# Patient Record
Sex: Male | Born: 1935 | Race: White | Hispanic: No | Marital: Married | Smoking: Never smoker
Health system: Southern US, Community
[De-identification: ages and names within clinical notes are randomized; demographics above are authoritative.]

## PROBLEM LIST (undated history)

## (undated) DIAGNOSIS — E785 Hyperlipidemia, unspecified: Secondary | ICD-10-CM

## (undated) DIAGNOSIS — I259 Chronic ischemic heart disease, unspecified: Secondary | ICD-10-CM

## (undated) DIAGNOSIS — Z8673 Personal history of transient ischemic attack (TIA), and cerebral infarction without residual deficits: Secondary | ICD-10-CM

## (undated) DIAGNOSIS — M1990 Unspecified osteoarthritis, unspecified site: Secondary | ICD-10-CM

## (undated) DIAGNOSIS — M25551 Pain in right hip: Secondary | ICD-10-CM

## (undated) DIAGNOSIS — K449 Diaphragmatic hernia without obstruction or gangrene: Secondary | ICD-10-CM

## (undated) DIAGNOSIS — I10 Essential (primary) hypertension: Secondary | ICD-10-CM

## (undated) DIAGNOSIS — R296 Repeated falls: Secondary | ICD-10-CM

## (undated) DIAGNOSIS — K219 Gastro-esophageal reflux disease without esophagitis: Secondary | ICD-10-CM

## (undated) DIAGNOSIS — K224 Dyskinesia of esophagus: Secondary | ICD-10-CM

## (undated) DIAGNOSIS — M109 Gout, unspecified: Secondary | ICD-10-CM

## (undated) DIAGNOSIS — J90 Pleural effusion, not elsewhere classified: Secondary | ICD-10-CM

## (undated) DIAGNOSIS — M79672 Pain in left foot: Secondary | ICD-10-CM

## (undated) DIAGNOSIS — K222 Esophageal obstruction: Secondary | ICD-10-CM

## (undated) DIAGNOSIS — C4491 Basal cell carcinoma of skin, unspecified: Secondary | ICD-10-CM

## (undated) DIAGNOSIS — G2581 Restless legs syndrome: Secondary | ICD-10-CM

## (undated) DIAGNOSIS — N400 Enlarged prostate without lower urinary tract symptoms: Secondary | ICD-10-CM

## (undated) DIAGNOSIS — F329 Major depressive disorder, single episode, unspecified: Secondary | ICD-10-CM

## (undated) DIAGNOSIS — Z66 Do not resuscitate: Secondary | ICD-10-CM

## (undated) DIAGNOSIS — IMO0001 Reserved for inherently not codable concepts without codable children: Secondary | ICD-10-CM

## (undated) DIAGNOSIS — F32A Depression, unspecified: Secondary | ICD-10-CM

## (undated) DIAGNOSIS — N200 Calculus of kidney: Secondary | ICD-10-CM

## (undated) DIAGNOSIS — R339 Retention of urine, unspecified: Secondary | ICD-10-CM

## (undated) DIAGNOSIS — F419 Anxiety disorder, unspecified: Secondary | ICD-10-CM

## (undated) DIAGNOSIS — I509 Heart failure, unspecified: Secondary | ICD-10-CM

## (undated) DIAGNOSIS — M79671 Pain in right foot: Secondary | ICD-10-CM

## (undated) DIAGNOSIS — K4090 Unilateral inguinal hernia, without obstruction or gangrene, not specified as recurrent: Secondary | ICD-10-CM

## (undated) DIAGNOSIS — K5792 Diverticulitis of intestine, part unspecified, without perforation or abscess without bleeding: Secondary | ICD-10-CM

## (undated) DIAGNOSIS — M47812 Spondylosis without myelopathy or radiculopathy, cervical region: Secondary | ICD-10-CM

## (undated) HISTORY — PX: FRACTURE SURGERY: SHX138

## (undated) HISTORY — PX: OTHER SURGICAL HISTORY: SHX169

## (undated) HISTORY — DX: Reserved for inherently not codable concepts without codable children: IMO0001

## (undated) HISTORY — DX: Essential (primary) hypertension: I10

## (undated) HISTORY — DX: Anxiety disorder, unspecified: F41.9

## (undated) HISTORY — PX: ESOPHAGEAL DILATION: SHX303

## (undated) HISTORY — DX: Dyskinesia of esophagus: K22.4

## (undated) HISTORY — PX: EXPLORATORY LAPAROTOMY W/ BOWEL RESECTION: SHX1544

## (undated) HISTORY — DX: Gastro-esophageal reflux disease without esophagitis: K21.9

## (undated) HISTORY — PX: HEMIARTHROPLASTY HIP: SUR652

## (undated) HISTORY — DX: Heart failure, unspecified: I50.9

---

## 1997-06-03 ENCOUNTER — Ambulatory Visit (HOSPITAL_BASED_OUTPATIENT_CLINIC_OR_DEPARTMENT_OTHER): Admission: RE | Admit: 1997-06-03 | Discharge: 1997-06-03 | Payer: Self-pay | Admitting: Orthopedic Surgery

## 2000-01-26 ENCOUNTER — Inpatient Hospital Stay (HOSPITAL_COMMUNITY): Admission: AD | Admit: 2000-01-26 | Discharge: 2000-01-27 | Payer: Self-pay | Admitting: Cardiology

## 2000-12-25 ENCOUNTER — Other Ambulatory Visit: Admission: RE | Admit: 2000-12-25 | Discharge: 2000-12-25 | Payer: Self-pay | Admitting: Dermatology

## 2001-11-29 ENCOUNTER — Other Ambulatory Visit: Admission: RE | Admit: 2001-11-29 | Discharge: 2001-11-29 | Payer: Self-pay | Admitting: Dermatology

## 2002-07-11 ENCOUNTER — Other Ambulatory Visit: Admission: RE | Admit: 2002-07-11 | Discharge: 2002-07-11 | Payer: Self-pay | Admitting: Dermatology

## 2003-09-15 ENCOUNTER — Other Ambulatory Visit: Admission: RE | Admit: 2003-09-15 | Discharge: 2003-09-15 | Payer: Self-pay | Admitting: Dermatology

## 2004-08-19 ENCOUNTER — Other Ambulatory Visit: Admission: RE | Admit: 2004-08-19 | Discharge: 2004-08-19 | Payer: Self-pay | Admitting: Dermatology

## 2004-10-25 ENCOUNTER — Other Ambulatory Visit: Admission: RE | Admit: 2004-10-25 | Discharge: 2004-10-25 | Payer: Self-pay | Admitting: Dermatology

## 2005-03-31 ENCOUNTER — Other Ambulatory Visit: Admission: RE | Admit: 2005-03-31 | Discharge: 2005-03-31 | Payer: Self-pay | Admitting: Dermatology

## 2010-06-03 ENCOUNTER — Ambulatory Visit (INDEPENDENT_AMBULATORY_CARE_PROVIDER_SITE_OTHER): Payer: Medicare Other | Admitting: Orthopedic Surgery

## 2010-06-03 ENCOUNTER — Encounter: Payer: Self-pay | Admitting: Orthopedic Surgery

## 2010-06-03 VITALS — HR 64 | Resp 18 | Ht 71.0 in | Wt 170.0 lb

## 2010-06-03 DIAGNOSIS — M1710 Unilateral primary osteoarthritis, unspecified knee: Secondary | ICD-10-CM

## 2010-06-03 MED ORDER — METHYLPREDNISOLONE ACETATE 40 MG/ML IJ SUSP: 40.0000 mg | Freq: Once | INTRAMUSCULAR | Status: DC

## 2010-06-03 NOTE — Progress Notes (Signed)
Chief complaint bilateral knee pain.  77 year old male transfers care from [facility] orthopedists he LEFT town. Presents with over a 5 year history of bilateral knee pain, usually, LEFT greater than RIGHT that today, RIGHT greater than LEFT associated with weakness and giving out of his knees. Intermittent swelling, treated with multiple aspirations and injections successfully.  He does have constant 8/10, throbbing pain. Review of systems unexpected weight loss. Ordering of the heartburn, and constipation, joint pain, unsteady gait, depression, and easy bruising. The other 5 systems reviewed were negative.  His vital signs are stable. His appearance was normal. He was well groomed. He was oriented x3. His mood and affect are normal.  His ambulation was supported by a cane and a shuffling slow gait with decreased range of motion.  And mild contractures in the upper extremities. Nothing serious. All joints were reduced and muscle tone was normal. Skin was normal. X4 extremities.  He has good pulses in both feet and the temperature of the extremities are warm.  Sensation is normal. No pathologic reflexes and balance is good, considering his age and condition.  X-rays were brought with the patient. They show bilateral symmetric joint space narrowing on 2 views of each knee.  We aspirated both knees and injected both knees. The RIGHT had more fluid than the LEFT.  Diagnosis end stage osteoarthritis p Plan aspirations and injections as needed.

## 2010-06-03 NOTE — Patient Instructions (Signed)
You have received a steroid shot. 15% of patients experience increased pain at the injection site with in the next 24 hours. This is best treated with ice and tylenol extra strength 2 tabs every 8 hours. If you are still having pain please call the office.    

## 2010-09-02 ENCOUNTER — Ambulatory Visit (INDEPENDENT_AMBULATORY_CARE_PROVIDER_SITE_OTHER): Payer: Medicare Other | Admitting: Orthopedic Surgery

## 2010-09-02 DIAGNOSIS — IMO0002 Reserved for concepts with insufficient information to code with codable children: Secondary | ICD-10-CM

## 2010-09-02 DIAGNOSIS — M1710 Unilateral primary osteoarthritis, unspecified knee: Secondary | ICD-10-CM

## 2010-09-02 MED ORDER — METHYLPREDNISOLONE ACETATE 40 MG/ML IJ SUSP: 40.0000 mg | Freq: Once | INTRAMUSCULAR | Status: DC

## 2010-09-02 NOTE — Patient Instructions (Signed)
TRY ALEVE TWICE A DAY   AND CAPSAICIN CREAM RUB ON ANY AREAS THAT ARE HURTING 3 TIMES A DAY

## 2010-09-02 NOTE — Progress Notes (Signed)
Injection LEFT knee.  Consent was obtained.  Time out was taken   LEFT knee was injected with Depo-Medrol 40 mg plus lidocaine 1% 4 cc.  Knee was prepped with alcohol and anesthetized with ethyl chloride.  The injection was tolerated without complication.    Verbal consent was obtained   Time out completed   Under sterile conditions the right knee was injected with Depomedrol 40 mg / ml (1 ml) and lidocaine 1% (4 ml)  There were no complications

## 2010-12-22 ENCOUNTER — Encounter: Payer: Self-pay | Admitting: Orthopedic Surgery

## 2010-12-22 ENCOUNTER — Ambulatory Visit (INDEPENDENT_AMBULATORY_CARE_PROVIDER_SITE_OTHER): Payer: Medicare Other | Admitting: Orthopedic Surgery

## 2010-12-22 VITALS — Ht 71.0 in | Wt 176.0 lb

## 2010-12-22 DIAGNOSIS — M1710 Unilateral primary osteoarthritis, unspecified knee: Secondary | ICD-10-CM | POA: Insufficient documentation

## 2010-12-22 DIAGNOSIS — M179 Osteoarthritis of knee, unspecified: Secondary | ICD-10-CM | POA: Insufficient documentation

## 2010-12-22 DIAGNOSIS — M25569 Pain in unspecified knee: Secondary | ICD-10-CM

## 2010-12-22 NOTE — Progress Notes (Signed)
Patient scheduled for requested bilateral knee injections for osteoarthritis chronic.  Age and medical condition did not warrant knee replacement surgery  Knee  Injection Procedure Note  Pre-operative Diagnosis: left knee oa  Post-operative Diagnosis: same  Indications: pain  Anesthesia: ethyl chloride   Procedure Details   Verbal consent was obtained for the procedure. Time out was completed.The joint was prepped with alcohol, followed by  Ethyl chloride spray and A 20 gauge needle was inserted into the knee via lateral approach; 4ml 1% lidocaine and 1 ml of depomedrol  was then injected into the joint . The needle was removed and the area cleansed and dressed.  Complications:  None; patient tolerated the procedure well.  Knee  Injection Procedure Note  Pre-operative Diagnosis: right knee oa  Post-operative Diagnosis: same  Indications: pain  Anesthesia: ethyl chloride   Procedure Details   Verbal consent was obtained for the procedure. Time out was completed.The joint was prepped with alcohol, followed by  Ethyl chloride spray and A 20 gauge needle was inserted into the knee via lateral approach; 4ml 1% lidocaine and 1 ml of depomedrol  was then injected into the joint . The needle was removed and the area cleansed and dressed.  Complications:  None; patient tolerated the procedure well.  Follow every 2-3 months

## 2010-12-22 NOTE — Patient Instructions (Signed)
You have received a steroid shot. 15% of patients experience increased pain at the injection site with in the next 24 hours. This is best treated with ice and tylenol extra strength 2 tabs every 8 hours. If you are still having pain please call the office.    

## 2011-03-30 DIAGNOSIS — M1990 Unspecified osteoarthritis, unspecified site: Secondary | ICD-10-CM | POA: Diagnosis not present

## 2011-03-30 DIAGNOSIS — N400 Enlarged prostate without lower urinary tract symptoms: Secondary | ICD-10-CM | POA: Diagnosis not present

## 2011-05-24 DIAGNOSIS — I739 Peripheral vascular disease, unspecified: Secondary | ICD-10-CM | POA: Diagnosis not present

## 2011-06-22 ENCOUNTER — Ambulatory Visit (INDEPENDENT_AMBULATORY_CARE_PROVIDER_SITE_OTHER): Payer: Medicare Other | Admitting: Orthopedic Surgery

## 2011-06-22 ENCOUNTER — Encounter: Payer: Self-pay | Admitting: Orthopedic Surgery

## 2011-06-22 VITALS — Ht 71.0 in | Wt 176.0 lb

## 2011-06-22 DIAGNOSIS — M1710 Unilateral primary osteoarthritis, unspecified knee: Secondary | ICD-10-CM

## 2011-06-22 DIAGNOSIS — IMO0002 Reserved for concepts with insufficient information to code with codable children: Secondary | ICD-10-CM

## 2011-06-22 NOTE — Patient Instructions (Signed)
You have received a steroid shot. 15% of patients experience increased pain at the injection site with in the next 24 hours. This is best treated with ice and tylenol extra strength 2 tabs every 8 hours. If you are still having pain please call the office.    

## 2011-06-22 NOTE — Progress Notes (Signed)
Patient ID: Patrick [name], male   DOB: 1913/05/04, 78 y.o.   MRN: [id number] Knee  Injection Procedure Note  Pre-operative Diagnosis: left knee oa  Post-operative Diagnosis: same  Indications: pain  Anesthesia: ethyl chloride   Procedure Details   Verbal consent was obtained for the procedure. Time out was completed.The joint was prepped with alcohol, followed by  Ethyl chloride spray and A 20 gauge needle was inserted into the knee via lateral approach; 4ml 1% lidocaine and 1 ml of depomedrol  was then injected into the joint . The needle was removed and the area cleansed and dressed.  Complications:  None; patient tolerated the procedure well. Knee  Injection Procedure Note  Pre-operative Diagnosis: right knee oa  Post-operative Diagnosis: same  Indications: pain  Anesthesia: ethyl chloride   Procedure Details   Verbal consent was obtained for the procedure. Time out was completed.The joint was prepped with alcohol, followed by  Ethyl chloride spray and A 20 gauge needle was inserted into the knee via lateral approach; 4ml 1% lidocaine and 1 ml of depomedrol  was then injected into the joint . The needle was removed and the area cleansed and dressed.  Complications:  None; patient tolerated the procedure well.

## 2011-06-24 DIAGNOSIS — L570 Actinic keratosis: Secondary | ICD-10-CM | POA: Diagnosis not present

## 2011-06-24 DIAGNOSIS — C44320 Squamous cell carcinoma of skin of unspecified parts of face: Secondary | ICD-10-CM | POA: Diagnosis not present

## 2011-06-24 DIAGNOSIS — D235 Other benign neoplasm of skin of trunk: Secondary | ICD-10-CM | POA: Diagnosis not present

## 2011-07-05 DIAGNOSIS — L97509 Non-pressure chronic ulcer of other part of unspecified foot with unspecified severity: Secondary | ICD-10-CM | POA: Diagnosis not present

## 2011-07-14 DIAGNOSIS — L97509 Non-pressure chronic ulcer of other part of unspecified foot with unspecified severity: Secondary | ICD-10-CM | POA: Diagnosis not present

## 2011-07-29 DIAGNOSIS — M1990 Unspecified osteoarthritis, unspecified site: Secondary | ICD-10-CM | POA: Diagnosis not present

## 2011-07-29 DIAGNOSIS — N400 Enlarged prostate without lower urinary tract symptoms: Secondary | ICD-10-CM | POA: Diagnosis not present

## 2011-08-04 DIAGNOSIS — L219 Seborrheic dermatitis, unspecified: Secondary | ICD-10-CM | POA: Diagnosis not present

## 2011-08-04 DIAGNOSIS — Z85828 Personal history of other malignant neoplasm of skin: Secondary | ICD-10-CM | POA: Diagnosis not present

## 2011-08-04 DIAGNOSIS — L570 Actinic keratosis: Secondary | ICD-10-CM | POA: Diagnosis not present

## 2011-08-04 DIAGNOSIS — L97509 Non-pressure chronic ulcer of other part of unspecified foot with unspecified severity: Secondary | ICD-10-CM | POA: Diagnosis not present

## 2011-08-11 DIAGNOSIS — L600 Ingrowing nail: Secondary | ICD-10-CM | POA: Diagnosis not present

## 2011-08-11 DIAGNOSIS — M79609 Pain in unspecified limb: Secondary | ICD-10-CM | POA: Diagnosis not present

## 2011-08-25 DIAGNOSIS — L97509 Non-pressure chronic ulcer of other part of unspecified foot with unspecified severity: Secondary | ICD-10-CM | POA: Diagnosis not present

## 2011-09-21 DIAGNOSIS — M25569 Pain in unspecified knee: Secondary | ICD-10-CM | POA: Diagnosis not present

## 2011-09-21 DIAGNOSIS — G2581 Restless legs syndrome: Secondary | ICD-10-CM | POA: Diagnosis not present

## 2011-09-29 DIAGNOSIS — L97509 Non-pressure chronic ulcer of other part of unspecified foot with unspecified severity: Secondary | ICD-10-CM | POA: Diagnosis not present

## 2011-10-13 DIAGNOSIS — L97509 Non-pressure chronic ulcer of other part of unspecified foot with unspecified severity: Secondary | ICD-10-CM | POA: Diagnosis not present

## 2011-10-30 DIAGNOSIS — Z23 Encounter for immunization: Secondary | ICD-10-CM | POA: Diagnosis not present

## 2011-11-02 DIAGNOSIS — L820 Inflamed seborrheic keratosis: Secondary | ICD-10-CM | POA: Diagnosis not present

## 2011-11-02 DIAGNOSIS — L570 Actinic keratosis: Secondary | ICD-10-CM | POA: Diagnosis not present

## 2011-11-03 DIAGNOSIS — L97509 Non-pressure chronic ulcer of other part of unspecified foot with unspecified severity: Secondary | ICD-10-CM | POA: Diagnosis not present

## 2011-12-01 DIAGNOSIS — L97509 Non-pressure chronic ulcer of other part of unspecified foot with unspecified severity: Secondary | ICD-10-CM | POA: Diagnosis not present

## 2011-12-22 DIAGNOSIS — L97509 Non-pressure chronic ulcer of other part of unspecified foot with unspecified severity: Secondary | ICD-10-CM | POA: Diagnosis not present

## 2011-12-26 DIAGNOSIS — M1990 Unspecified osteoarthritis, unspecified site: Secondary | ICD-10-CM | POA: Diagnosis not present

## 2011-12-26 DIAGNOSIS — F411 Generalized anxiety disorder: Secondary | ICD-10-CM | POA: Diagnosis not present

## 2012-03-01 DIAGNOSIS — L97509 Non-pressure chronic ulcer of other part of unspecified foot with unspecified severity: Secondary | ICD-10-CM | POA: Diagnosis not present

## 2012-04-19 DIAGNOSIS — L97509 Non-pressure chronic ulcer of other part of unspecified foot with unspecified severity: Secondary | ICD-10-CM | POA: Diagnosis not present

## 2012-05-10 DIAGNOSIS — L97509 Non-pressure chronic ulcer of other part of unspecified foot with unspecified severity: Secondary | ICD-10-CM | POA: Diagnosis not present

## 2012-05-24 DIAGNOSIS — M25569 Pain in unspecified knee: Secondary | ICD-10-CM | POA: Diagnosis not present

## 2012-05-24 DIAGNOSIS — G2581 Restless legs syndrome: Secondary | ICD-10-CM | POA: Diagnosis not present

## 2012-06-07 DIAGNOSIS — L97509 Non-pressure chronic ulcer of other part of unspecified foot with unspecified severity: Secondary | ICD-10-CM | POA: Diagnosis not present

## 2012-06-26 DIAGNOSIS — L97309 Non-pressure chronic ulcer of unspecified ankle with unspecified severity: Secondary | ICD-10-CM | POA: Diagnosis not present

## 2012-07-03 DIAGNOSIS — L97509 Non-pressure chronic ulcer of other part of unspecified foot with unspecified severity: Secondary | ICD-10-CM | POA: Diagnosis not present

## 2012-07-17 DIAGNOSIS — L97309 Non-pressure chronic ulcer of unspecified ankle with unspecified severity: Secondary | ICD-10-CM | POA: Diagnosis not present

## 2012-08-09 DIAGNOSIS — L97509 Non-pressure chronic ulcer of other part of unspecified foot with unspecified severity: Secondary | ICD-10-CM | POA: Diagnosis not present

## 2012-08-13 DIAGNOSIS — R609 Edema, unspecified: Secondary | ICD-10-CM | POA: Diagnosis not present

## 2012-08-23 DIAGNOSIS — L97509 Non-pressure chronic ulcer of other part of unspecified foot with unspecified severity: Secondary | ICD-10-CM | POA: Diagnosis not present

## 2012-09-13 DIAGNOSIS — L97509 Non-pressure chronic ulcer of other part of unspecified foot with unspecified severity: Secondary | ICD-10-CM | POA: Diagnosis not present

## 2012-09-27 DIAGNOSIS — M25569 Pain in unspecified knee: Secondary | ICD-10-CM | POA: Diagnosis not present

## 2012-09-27 DIAGNOSIS — R609 Edema, unspecified: Secondary | ICD-10-CM | POA: Diagnosis not present

## 2012-10-04 DIAGNOSIS — L97509 Non-pressure chronic ulcer of other part of unspecified foot with unspecified severity: Secondary | ICD-10-CM | POA: Diagnosis not present

## 2012-11-01 DIAGNOSIS — L97509 Non-pressure chronic ulcer of other part of unspecified foot with unspecified severity: Secondary | ICD-10-CM | POA: Diagnosis not present

## 2012-11-02 DIAGNOSIS — Z23 Encounter for immunization: Secondary | ICD-10-CM | POA: Diagnosis not present

## 2012-11-29 DIAGNOSIS — M2040 Other hammer toe(s) (acquired), unspecified foot: Secondary | ICD-10-CM | POA: Diagnosis not present

## 2012-11-29 DIAGNOSIS — L851 Acquired keratosis [keratoderma] palmaris et plantaris: Secondary | ICD-10-CM | POA: Diagnosis not present

## 2012-11-29 DIAGNOSIS — B351 Tinea unguium: Secondary | ICD-10-CM | POA: Diagnosis not present

## 2012-11-29 DIAGNOSIS — I739 Peripheral vascular disease, unspecified: Secondary | ICD-10-CM | POA: Diagnosis not present

## 2013-01-14 DIAGNOSIS — R609 Edema, unspecified: Secondary | ICD-10-CM | POA: Diagnosis not present

## 2013-01-23 DIAGNOSIS — R609 Edema, unspecified: Secondary | ICD-10-CM | POA: Diagnosis not present

## 2013-01-24 DIAGNOSIS — L97509 Non-pressure chronic ulcer of other part of unspecified foot with unspecified severity: Secondary | ICD-10-CM | POA: Diagnosis not present

## 2013-01-30 DIAGNOSIS — L97509 Non-pressure chronic ulcer of other part of unspecified foot with unspecified severity: Secondary | ICD-10-CM | POA: Diagnosis not present

## 2013-01-30 DIAGNOSIS — M79609 Pain in unspecified limb: Secondary | ICD-10-CM | POA: Diagnosis not present

## 2013-02-20 DIAGNOSIS — L97509 Non-pressure chronic ulcer of other part of unspecified foot with unspecified severity: Secondary | ICD-10-CM | POA: Diagnosis not present

## 2013-02-23 DIAGNOSIS — R262 Difficulty in walking, not elsewhere classified: Secondary | ICD-10-CM | POA: Diagnosis not present

## 2013-02-23 DIAGNOSIS — G2581 Restless legs syndrome: Secondary | ICD-10-CM | POA: Diagnosis not present

## 2013-02-23 DIAGNOSIS — S0003XA Contusion of scalp, initial encounter: Secondary | ICD-10-CM | POA: Diagnosis not present

## 2013-02-23 DIAGNOSIS — Z23 Encounter for immunization: Secondary | ICD-10-CM | POA: Diagnosis not present

## 2013-02-23 DIAGNOSIS — E785 Hyperlipidemia, unspecified: Secondary | ICD-10-CM | POA: Diagnosis not present

## 2013-02-23 DIAGNOSIS — S1093XA Contusion of unspecified part of neck, initial encounter: Secondary | ICD-10-CM | POA: Diagnosis not present

## 2013-02-23 DIAGNOSIS — Z96649 Presence of unspecified artificial hip joint: Secondary | ICD-10-CM | POA: Diagnosis not present

## 2013-02-23 DIAGNOSIS — I4510 Unspecified right bundle-branch block: Secondary | ICD-10-CM | POA: Diagnosis not present

## 2013-02-23 DIAGNOSIS — Z79899 Other long term (current) drug therapy: Secondary | ICD-10-CM | POA: Diagnosis not present

## 2013-02-23 DIAGNOSIS — M2550 Pain in unspecified joint: Secondary | ICD-10-CM | POA: Diagnosis not present

## 2013-02-23 DIAGNOSIS — R296 Repeated falls: Secondary | ICD-10-CM | POA: Diagnosis not present

## 2013-02-23 DIAGNOSIS — Z7401 Bed confinement status: Secondary | ICD-10-CM | POA: Diagnosis not present

## 2013-02-23 DIAGNOSIS — S72009D Fracture of unspecified part of neck of unspecified femur, subsequent encounter for closed fracture with routine healing: Secondary | ICD-10-CM | POA: Diagnosis not present

## 2013-02-23 DIAGNOSIS — M6281 Muscle weakness (generalized): Secondary | ICD-10-CM | POA: Diagnosis not present

## 2013-02-23 DIAGNOSIS — F329 Major depressive disorder, single episode, unspecified: Secondary | ICD-10-CM | POA: Diagnosis present

## 2013-02-23 DIAGNOSIS — Z8673 Personal history of transient ischemic attack (TIA), and cerebral infarction without residual deficits: Secondary | ICD-10-CM | POA: Diagnosis not present

## 2013-02-23 DIAGNOSIS — M1990 Unspecified osteoarthritis, unspecified site: Secondary | ICD-10-CM | POA: Diagnosis not present

## 2013-02-23 DIAGNOSIS — K219 Gastro-esophageal reflux disease without esophagitis: Secondary | ICD-10-CM | POA: Diagnosis not present

## 2013-02-23 DIAGNOSIS — S72009A Fracture of unspecified part of neck of unspecified femur, initial encounter for closed fracture: Secondary | ICD-10-CM | POA: Diagnosis not present

## 2013-02-23 DIAGNOSIS — I10 Essential (primary) hypertension: Secondary | ICD-10-CM | POA: Diagnosis not present

## 2013-02-23 DIAGNOSIS — S0100XA Unspecified open wound of scalp, initial encounter: Secondary | ICD-10-CM | POA: Diagnosis not present

## 2013-02-23 DIAGNOSIS — I509 Heart failure, unspecified: Secondary | ICD-10-CM | POA: Diagnosis not present

## 2013-02-23 DIAGNOSIS — W19XXXA Unspecified fall, initial encounter: Secondary | ICD-10-CM | POA: Diagnosis not present

## 2013-02-23 DIAGNOSIS — R269 Unspecified abnormalities of gait and mobility: Secondary | ICD-10-CM | POA: Diagnosis not present

## 2013-02-23 DIAGNOSIS — R279 Unspecified lack of coordination: Secondary | ICD-10-CM | POA: Diagnosis not present

## 2013-02-23 DIAGNOSIS — S298XXA Other specified injuries of thorax, initial encounter: Secondary | ICD-10-CM | POA: Diagnosis not present

## 2013-02-23 DIAGNOSIS — I259 Chronic ischemic heart disease, unspecified: Secondary | ICD-10-CM | POA: Diagnosis present

## 2013-02-23 DIAGNOSIS — S72033A Displaced midcervical fracture of unspecified femur, initial encounter for closed fracture: Secondary | ICD-10-CM | POA: Diagnosis not present

## 2013-02-23 DIAGNOSIS — R1312 Dysphagia, oropharyngeal phase: Secondary | ICD-10-CM | POA: Diagnosis not present

## 2013-02-23 DIAGNOSIS — J40 Bronchitis, not specified as acute or chronic: Secondary | ICD-10-CM | POA: Diagnosis not present

## 2013-02-23 DIAGNOSIS — Z885 Allergy status to narcotic agent status: Secondary | ICD-10-CM | POA: Diagnosis not present

## 2013-02-23 DIAGNOSIS — F3289 Other specified depressive episodes: Secondary | ICD-10-CM | POA: Diagnosis not present

## 2013-02-23 DIAGNOSIS — Z888 Allergy status to other drugs, medicaments and biological substances status: Secondary | ICD-10-CM | POA: Diagnosis not present

## 2013-02-23 DIAGNOSIS — Z87891 Personal history of nicotine dependence: Secondary | ICD-10-CM | POA: Diagnosis not present

## 2013-02-23 DIAGNOSIS — Z471 Aftercare following joint replacement surgery: Secondary | ICD-10-CM | POA: Diagnosis not present

## 2013-03-01 ENCOUNTER — Inpatient Hospital Stay
Admission: RE | Admit: 2013-03-01 | Discharge: 2013-03-08 | Disposition: A | Discharge status: Nursing Home (Includes ICF) | Payer: BLUE CROSS/BLUE SHIELD | Source: Ambulatory Visit | Attending: Internal Medicine | Admitting: Internal Medicine

## 2013-03-01 DIAGNOSIS — Z87442 Personal history of urinary calculi: Secondary | ICD-10-CM | POA: Diagnosis not present

## 2013-03-01 DIAGNOSIS — Z7401 Bed confinement status: Secondary | ICD-10-CM | POA: Diagnosis not present

## 2013-03-01 DIAGNOSIS — Z862 Personal history of diseases of the blood and blood-forming organs and certain disorders involving the immune mechanism: Secondary | ICD-10-CM | POA: Diagnosis not present

## 2013-03-01 DIAGNOSIS — R079 Chest pain, unspecified: Secondary | ICD-10-CM | POA: Diagnosis not present

## 2013-03-01 DIAGNOSIS — R634 Abnormal weight loss: Secondary | ICD-10-CM | POA: Diagnosis not present

## 2013-03-01 DIAGNOSIS — S72009D Fracture of unspecified part of neck of unspecified femur, subsequent encounter for closed fracture with routine healing: Secondary | ICD-10-CM | POA: Diagnosis not present

## 2013-03-01 DIAGNOSIS — Z85828 Personal history of other malignant neoplasm of skin: Secondary | ICD-10-CM | POA: Diagnosis not present

## 2013-03-01 DIAGNOSIS — L259 Unspecified contact dermatitis, unspecified cause: Secondary | ICD-10-CM | POA: Diagnosis not present

## 2013-03-01 DIAGNOSIS — F3289 Other specified depressive episodes: Secondary | ICD-10-CM | POA: Diagnosis not present

## 2013-03-01 DIAGNOSIS — M1990 Unspecified osteoarthritis, unspecified site: Secondary | ICD-10-CM | POA: Diagnosis not present

## 2013-03-01 DIAGNOSIS — Z8709 Personal history of other diseases of the respiratory system: Secondary | ICD-10-CM | POA: Diagnosis not present

## 2013-03-01 DIAGNOSIS — R5381 Other malaise: Secondary | ICD-10-CM | POA: Diagnosis not present

## 2013-03-01 DIAGNOSIS — Z23 Encounter for immunization: Secondary | ICD-10-CM | POA: Diagnosis not present

## 2013-03-01 DIAGNOSIS — G4700 Insomnia, unspecified: Secondary | ICD-10-CM | POA: Diagnosis not present

## 2013-03-01 DIAGNOSIS — R279 Unspecified lack of coordination: Secondary | ICD-10-CM | POA: Diagnosis not present

## 2013-03-01 DIAGNOSIS — R0602 Shortness of breath: Secondary | ICD-10-CM | POA: Diagnosis not present

## 2013-03-01 DIAGNOSIS — M47812 Spondylosis without myelopathy or radiculopathy, cervical region: Secondary | ICD-10-CM | POA: Diagnosis not present

## 2013-03-01 DIAGNOSIS — J90 Pleural effusion, not elsewhere classified: Secondary | ICD-10-CM | POA: Diagnosis not present

## 2013-03-01 DIAGNOSIS — Z79899 Other long term (current) drug therapy: Secondary | ICD-10-CM | POA: Diagnosis not present

## 2013-03-01 DIAGNOSIS — Z7982 Long term (current) use of aspirin: Secondary | ICD-10-CM | POA: Diagnosis not present

## 2013-03-01 DIAGNOSIS — R1312 Dysphagia, oropharyngeal phase: Secondary | ICD-10-CM | POA: Diagnosis not present

## 2013-03-01 DIAGNOSIS — F411 Generalized anxiety disorder: Secondary | ICD-10-CM | POA: Diagnosis not present

## 2013-03-01 DIAGNOSIS — R339 Retention of urine, unspecified: Secondary | ICD-10-CM | POA: Diagnosis not present

## 2013-03-01 DIAGNOSIS — M6281 Muscle weakness (generalized): Secondary | ICD-10-CM | POA: Diagnosis not present

## 2013-03-01 DIAGNOSIS — N3289 Other specified disorders of bladder: Secondary | ICD-10-CM | POA: Diagnosis not present

## 2013-03-01 DIAGNOSIS — G2581 Restless legs syndrome: Secondary | ICD-10-CM | POA: Diagnosis not present

## 2013-03-01 DIAGNOSIS — S72033A Displaced midcervical fracture of unspecified femur, initial encounter for closed fracture: Secondary | ICD-10-CM | POA: Diagnosis not present

## 2013-03-01 DIAGNOSIS — Z8673 Personal history of transient ischemic attack (TIA), and cerebral infarction without residual deficits: Secondary | ICD-10-CM | POA: Diagnosis not present

## 2013-03-01 DIAGNOSIS — R1084 Generalized abdominal pain: Secondary | ICD-10-CM | POA: Diagnosis not present

## 2013-03-01 DIAGNOSIS — Z8639 Personal history of other endocrine, nutritional and metabolic disease: Secondary | ICD-10-CM | POA: Diagnosis not present

## 2013-03-01 DIAGNOSIS — S72143A Displaced intertrochanteric fracture of unspecified femur, initial encounter for closed fracture: Secondary | ICD-10-CM | POA: Diagnosis not present

## 2013-03-01 DIAGNOSIS — I10 Essential (primary) hypertension: Secondary | ICD-10-CM | POA: Diagnosis not present

## 2013-03-01 DIAGNOSIS — K7689 Other specified diseases of liver: Secondary | ICD-10-CM | POA: Diagnosis not present

## 2013-03-01 DIAGNOSIS — R262 Difficulty in walking, not elsewhere classified: Secondary | ICD-10-CM | POA: Diagnosis not present

## 2013-03-01 DIAGNOSIS — I509 Heart failure, unspecified: Secondary | ICD-10-CM | POA: Diagnosis not present

## 2013-03-01 DIAGNOSIS — J9819 Other pulmonary collapse: Secondary | ICD-10-CM | POA: Diagnosis not present

## 2013-03-01 DIAGNOSIS — M2550 Pain in unspecified joint: Secondary | ICD-10-CM | POA: Diagnosis not present

## 2013-03-01 DIAGNOSIS — N400 Enlarged prostate without lower urinary tract symptoms: Secondary | ICD-10-CM | POA: Diagnosis not present

## 2013-03-01 DIAGNOSIS — R5383 Other fatigue: Secondary | ICD-10-CM | POA: Diagnosis not present

## 2013-03-01 DIAGNOSIS — M25559 Pain in unspecified hip: Secondary | ICD-10-CM | POA: Diagnosis not present

## 2013-03-01 DIAGNOSIS — K4090 Unilateral inguinal hernia, without obstruction or gangrene, not specified as recurrent: Secondary | ICD-10-CM | POA: Diagnosis not present

## 2013-03-01 DIAGNOSIS — K219 Gastro-esophageal reflux disease without esophagitis: Secondary | ICD-10-CM | POA: Diagnosis not present

## 2013-03-01 DIAGNOSIS — F329 Major depressive disorder, single episode, unspecified: Secondary | ICD-10-CM | POA: Diagnosis not present

## 2013-03-02 ENCOUNTER — Ambulatory Visit (HOSPITAL_COMMUNITY)
Admission: RE | Admit: 2013-03-02 | Discharge: 2013-03-02 | Disposition: A | Discharge status: Home / Self Care | Payer: Medicare Other | Source: Ambulatory Visit | Attending: Internal Medicine | Admitting: Internal Medicine

## 2013-03-02 ENCOUNTER — Encounter: Payer: Self-pay | Admitting: Internal Medicine

## 2013-03-02 ENCOUNTER — Other Ambulatory Visit (HOSPITAL_BASED_OUTPATIENT_CLINIC_OR_DEPARTMENT_OTHER): Payer: Self-pay | Admitting: Internal Medicine

## 2013-03-02 ENCOUNTER — Non-Acute Institutional Stay (SKILLED_NURSING_FACILITY): Payer: Medicare Other | Admitting: Internal Medicine

## 2013-03-02 DIAGNOSIS — J90 Pleural effusion, not elsewhere classified: Secondary | ICD-10-CM | POA: Diagnosis not present

## 2013-03-02 DIAGNOSIS — F329 Major depressive disorder, single episode, unspecified: Secondary | ICD-10-CM

## 2013-03-02 DIAGNOSIS — R0602 Shortness of breath: Secondary | ICD-10-CM

## 2013-03-02 DIAGNOSIS — G2581 Restless legs syndrome: Secondary | ICD-10-CM

## 2013-03-02 DIAGNOSIS — J9819 Other pulmonary collapse: Secondary | ICD-10-CM | POA: Diagnosis not present

## 2013-03-02 DIAGNOSIS — F32A Depression, unspecified: Secondary | ICD-10-CM

## 2013-03-02 DIAGNOSIS — F3289 Other specified depressive episodes: Secondary | ICD-10-CM | POA: Diagnosis not present

## 2013-03-02 DIAGNOSIS — N400 Enlarged prostate without lower urinary tract symptoms: Secondary | ICD-10-CM

## 2013-03-02 DIAGNOSIS — S72009D Fracture of unspecified part of neck of unspecified femur, subsequent encounter for closed fracture with routine healing: Secondary | ICD-10-CM | POA: Diagnosis not present

## 2013-03-02 DIAGNOSIS — R0902 Hypoxemia: Secondary | ICD-10-CM

## 2013-03-02 NOTE — Progress Notes (Signed)
Patient ID: Patrick [name], male   DOB: [birth year], 80 y.o.   MRN: [id number]    This encounter was created in error - please disregard.

## 2013-03-04 ENCOUNTER — Non-Acute Institutional Stay (SKILLED_NURSING_FACILITY): Payer: Medicare Other | Admitting: Internal Medicine

## 2013-03-04 ENCOUNTER — Ambulatory Visit (HOSPITAL_COMMUNITY): Payer: No Typology Code available for payment source | Attending: Internal Medicine

## 2013-03-04 DIAGNOSIS — S72142A Displaced intertrochanteric fracture of left femur, initial encounter for closed fracture: Secondary | ICD-10-CM

## 2013-03-04 DIAGNOSIS — K4090 Unilateral inguinal hernia, without obstruction or gangrene, not specified as recurrent: Secondary | ICD-10-CM

## 2013-03-04 DIAGNOSIS — I509 Heart failure, unspecified: Secondary | ICD-10-CM

## 2013-03-04 DIAGNOSIS — S72143A Displaced intertrochanteric fracture of unspecified femur, initial encounter for closed fracture: Secondary | ICD-10-CM

## 2013-03-04 DIAGNOSIS — K219 Gastro-esophageal reflux disease without esophagitis: Secondary | ICD-10-CM

## 2013-03-04 DIAGNOSIS — J9819 Other pulmonary collapse: Secondary | ICD-10-CM | POA: Insufficient documentation

## 2013-03-04 NOTE — Progress Notes (Signed)
Patient ID: Patrick [name], male   DOB: 06-Oct-1913, 80 y.o.   MRN: [id number]  Facility; [facility] Chief complaint; admission to SNF post admit to [facility] from 1/11 to 1/16  History; this is a 80 year old man who had a fall. He apparently lives in an assisted living. He had a left femur neck fracture. He underwent a left hip hemiarthroplasty. He does not sound as if he had any significant postoperative problems. A chest x-ray was done that was clear.  Since he is admitted to this SNF for he has complained of shortness of breath. A chest x-ray showed bilateral pleural effusions lab work showed a comprehensive metabolic panel with an albumin of 2.4 and a BNP of 2132... he was started on Lasix 20 mg a day for 3 doses and some potassium.  Past medical history #1 history of cervical degenerative joint disease #2 osteoarthritis #3 basal cell skin cancer #4 diverticulosis #5 hyperlipidemia #6 inguinal hernia #7 ischemic heart disease #8 hiatal hernia #9 nephrolithiasis #10 esophageal stricture and dilation in the past #11 recurrent TIAs #12 restless leg syndrome on request #13 recurrent diverticulitis had a perforation and exploratory laparotomy laparotomy and sigmoid resection in 2011 #14 gastroesophageal reflux disease #15 hypertension #16 right ureter stone with right-sided hydronephrosis in 2011  Discharge medications #1 Flomax 0.4 one by mouth daily #2 omeprazole 40 mg one by mouth daily #3 Requip 1 mg by mouth twice a day #4 Zoloft 50 one by mouth daily #5 Vicodin 5/325 1 by mouth every 6 when necessary #6 aspirin 325 one by mouth twice a day for 2 weeks followed by 325 one by mouth q.  Socially; patient states he lives at [facility] assisted living presumably in [place]. Used a walker. Has a wheelchair but does not use it. [address] on the way to the bathroom  Review of systems Respiratory; complained of shortness of breath over the weekend Cardiac no chest pain or  palpitations GI no, pain GU no dysuria Musculoskeletal operative leg on the left is rotated inward  Physical examination General the patient is frail-looking but it seems to be quite cognitively intact Gen. O2 saturation 95% on 2 L respirations 24 and Cheyne-Stokes pulse rate 77 Respiratory clear entry bilaterally Cardiac heart sounds are normal there is no murmurs or gallops. JVP is not elevated Abdomen; there is a significant right direct inguinal hernia however this reduces easily probably some degree of an incisional hernia related to his previous colon surgery on the right again this reduces easily GU; latter is not distended no CVA tenderness Extremities left hip incision looks well approximated without evidence of infection some mild bruising. No evidence of a DVT osteoarthritis of both knees. Again the left leg is indeed inverted.   Impression/plan #1 status post left hip hemiarthroplasty; aspirin we'll not provide adequate DVT prophylaxis we'll move to xarelto #2 question congestive heart diagnosed since his arrival here. I've reviewed the chest x-ray which doesn't seem to show bilateral pleural effusions but without other stigmata of congestive heart failure. This may be volume overload postoperatively. He has been given Lasix 20 mg daily for 3 days will require a followup chest x-ray #3 extensive right-sided abdominal herniation which I think is a combination of a direct inguinal hernia on the right as well as incisional hernia. All of this appears to be asymptomatic #4 BPH normal no evidence that this is active #5 significant gastroesophageal reflux disease on PPI #6 restless leg syndrome on Requip twice a day

## 2013-03-07 ENCOUNTER — Non-Acute Institutional Stay (SKILLED_NURSING_FACILITY): Payer: Medicare Other | Admitting: Internal Medicine

## 2013-03-07 DIAGNOSIS — G4700 Insomnia, unspecified: Secondary | ICD-10-CM | POA: Diagnosis not present

## 2013-03-07 DIAGNOSIS — L259 Unspecified contact dermatitis, unspecified cause: Secondary | ICD-10-CM | POA: Diagnosis not present

## 2013-03-07 DIAGNOSIS — J90 Pleural effusion, not elsewhere classified: Secondary | ICD-10-CM | POA: Diagnosis not present

## 2013-03-07 DIAGNOSIS — L309 Dermatitis, unspecified: Secondary | ICD-10-CM

## 2013-03-07 NOTE — Progress Notes (Signed)
Patient ID: Patrick [name], male   DOB: [birth year], 80 y.o.   MRN: [id number]   This is an acute visit.  Level of care skilled.  Facility [facility].  Chief complaint-acute visit followup question CHF-shortness of breath.  History of present illness.  Patient is a very pleasant 80 year old male here for rehabilitation after sustaining a left hip fracture that was surgically repaired apparently without much complication.  Apparently shortly after his arrival here he did have an hypoxic episode with shortness of breath chest x-ray was ordered which showed bilateral pleural effusions with passive atelectasis.  I did start him on Lasix low dose 20 mg a day for 3 days over the weekend-Dr. Dellia Nims did followup on this on Monday  reviewed labs which were ordered--which showed a somewhat low albumin of 2.4 as well as a pro BNP of 2132.--Per his review he felt this may be volume overload postop and ordered a followup chest x-ray--also wrote to continue the Lasix and potassium   A followup chest x-ray on January 19 showed unchanged bilateral pleural effusions and bibasilar atelectasis-negative for edema  He is on 2 L of oxygen O2 stats have been satisfactory in the mid high 90s he does not complaini of any cough or shortness of breath appears to be comfortable.  Family states he does have some history of insomnia and apparently had been on numerous medications at one point in the past including apparently Benadryl and Xanax at night he also received Requip-he continues on the Requip for restless legs.  Apparently he's had some restless evenings I did speak to him about that this evening-he says actually he slept  well last night we will have to keep an eye on this however  Family medical social history as been reviewed per progress note on 03/04/2012.  Medications have been reviewed per MAR-it appears his Lasix was stopped on the 20th we will have to restart this.  Review of systems.  In general  denies any fever or chills.  Respiratory no shortness of breath or cough.  Cardiac no chest pain.  GI no complaint of nausea vomiting diarrhea constipation.  Psych-appears grossly oriented does not appear to be anxious as noted said he slept well last night.  Apparently some significant history of restlessness insomnia at home and apparently according to family earlier in his stay here.  Physical exam.  Temperature is 97.9 pulse 88 respirations 20 blood pressure 106/50 O2 saturation 97% on 2 L oxygen.  In general this is a very pleasant elderly male in no distress lying comfortably in bed.  His skin is warm and dry.  I do note the left third toe distal aspect there is a dry crusted area-there is no surrounding edema erythema or tenderness.  Chest is clear to auscultation no labored breathing.  Heart is regular rate and rhythm without murmur gallop or rub he has scant lower extremity edema.  Abdomen is soft nontender positive bowel sounds.--hx hernia  On right...--Non tender him  Neurologic-appears grossly intact his speech is clear.  Labs.  January 21 t 2015.  Sodium 138 potassium 3.9 BUN 13 creatinine 0.88.  Assessment and plan.  #1-history of pleural effusions-updated x-ray shows this is relatively unchanged-will write order to make sure Lasix is restarted-give 1 dose tonight and then continue 20 mg a day as well as well as potassium 10 mEq a day-recheck a metabolic panel early next week -- respiratory status is stable Also update BNP next week--weights have been ordered 3 times  a week-according to the data  I see his weight was 189 on the 17th and 177 on the 21st-I suspect there may be some scale vari ability here we'll continue to monitor weights  #2-history of insomnia-apparently slept well last night although we'll have to monitor this-apparently he was taking Xanax as well as Benadryl at night certainly would be concerned about oversedation -says he slept well last  night Will monitor for now--continues to receive Requip for restless legs  #3-toe issues-I spoke with nursing about having wound care to monitor this-I do not see any sign of infection but this will have to be followed--and we'll await their input.  [id number]        .  D

## 2013-03-08 ENCOUNTER — Emergency Department (HOSPITAL_COMMUNITY): Payer: Medicare Other

## 2013-03-08 ENCOUNTER — Emergency Department (HOSPITAL_COMMUNITY)
Admission: EM | Admit: 2013-03-08 | Discharge: 2013-03-08 | Disposition: A | Discharge status: Skilled Nursing Facility | Payer: Medicare Other | Attending: Emergency Medicine | Admitting: Emergency Medicine

## 2013-03-08 ENCOUNTER — Inpatient Hospital Stay
Admission: RE | Admit: 2013-03-08 | Discharge: 2013-08-23 | Disposition: A | Discharge status: Skilled Nursing Facility | Payer: Medicare Other | Source: Ambulatory Visit | Attending: Internal Medicine | Admitting: Internal Medicine

## 2013-03-08 ENCOUNTER — Encounter (HOSPITAL_COMMUNITY): Payer: Self-pay | Admitting: Emergency Medicine

## 2013-03-08 DIAGNOSIS — R079 Chest pain, unspecified: Secondary | ICD-10-CM | POA: Diagnosis not present

## 2013-03-08 DIAGNOSIS — R1084 Generalized abdominal pain: Secondary | ICD-10-CM | POA: Diagnosis not present

## 2013-03-08 DIAGNOSIS — R339 Retention of urine, unspecified: Secondary | ICD-10-CM

## 2013-03-08 DIAGNOSIS — Z8639 Personal history of other endocrine, nutritional and metabolic disease: Secondary | ICD-10-CM | POA: Insufficient documentation

## 2013-03-08 DIAGNOSIS — G2581 Restless legs syndrome: Secondary | ICD-10-CM | POA: Insufficient documentation

## 2013-03-08 DIAGNOSIS — K7689 Other specified diseases of liver: Secondary | ICD-10-CM | POA: Diagnosis not present

## 2013-03-08 DIAGNOSIS — N3289 Other specified disorders of bladder: Secondary | ICD-10-CM | POA: Diagnosis not present

## 2013-03-08 DIAGNOSIS — M47812 Spondylosis without myelopathy or radiculopathy, cervical region: Secondary | ICD-10-CM | POA: Insufficient documentation

## 2013-03-08 DIAGNOSIS — Z87442 Personal history of urinary calculi: Secondary | ICD-10-CM | POA: Insufficient documentation

## 2013-03-08 DIAGNOSIS — K219 Gastro-esophageal reflux disease without esophagitis: Secondary | ICD-10-CM | POA: Insufficient documentation

## 2013-03-08 DIAGNOSIS — Z8709 Personal history of other diseases of the respiratory system: Secondary | ICD-10-CM | POA: Insufficient documentation

## 2013-03-08 DIAGNOSIS — Z85828 Personal history of other malignant neoplasm of skin: Secondary | ICD-10-CM | POA: Insufficient documentation

## 2013-03-08 DIAGNOSIS — Z862 Personal history of diseases of the blood and blood-forming organs and certain disorders involving the immune mechanism: Secondary | ICD-10-CM | POA: Insufficient documentation

## 2013-03-08 DIAGNOSIS — F411 Generalized anxiety disorder: Secondary | ICD-10-CM | POA: Insufficient documentation

## 2013-03-08 DIAGNOSIS — Z79899 Other long term (current) drug therapy: Secondary | ICD-10-CM | POA: Insufficient documentation

## 2013-03-08 DIAGNOSIS — Z7982 Long term (current) use of aspirin: Secondary | ICD-10-CM | POA: Insufficient documentation

## 2013-03-08 DIAGNOSIS — I10 Essential (primary) hypertension: Secondary | ICD-10-CM | POA: Insufficient documentation

## 2013-03-08 DIAGNOSIS — Z8673 Personal history of transient ischemic attack (TIA), and cerebral infarction without residual deficits: Secondary | ICD-10-CM | POA: Insufficient documentation

## 2013-03-08 DIAGNOSIS — J189 Pneumonia, unspecified organism: Principal | ICD-10-CM

## 2013-03-08 HISTORY — DX: Diverticulitis of intestine, part unspecified, without perforation or abscess without bleeding: K57.92

## 2013-03-08 HISTORY — DX: Hyperlipidemia, unspecified: E78.5

## 2013-03-08 HISTORY — DX: Gastro-esophageal reflux disease without esophagitis: K21.9

## 2013-03-08 HISTORY — DX: Esophageal obstruction: K22.2

## 2013-03-08 HISTORY — DX: Restless legs syndrome: G25.81

## 2013-03-08 HISTORY — DX: Calculus of kidney: N20.0

## 2013-03-08 HISTORY — DX: Pleural effusion, not elsewhere classified: J90

## 2013-03-08 HISTORY — DX: Unspecified osteoarthritis, unspecified site: M19.90

## 2013-03-08 HISTORY — DX: Personal history of transient ischemic attack (TIA), and cerebral infarction without residual deficits: Z86.73

## 2013-03-08 HISTORY — DX: Diaphragmatic hernia without obstruction or gangrene: K44.9

## 2013-03-08 HISTORY — DX: Unilateral inguinal hernia, without obstruction or gangrene, not specified as recurrent: K40.90

## 2013-03-08 HISTORY — DX: Basal cell carcinoma of skin, unspecified: C44.91

## 2013-03-08 HISTORY — DX: Chronic ischemic heart disease, unspecified: I25.9

## 2013-03-08 HISTORY — DX: Spondylosis without myelopathy or radiculopathy, cervical region: M47.812

## 2013-03-08 LAB — CBC WITH DIFFERENTIAL/PLATELET
BASOS PCT: 0 % (ref 0–1)
Basophils Absolute: 0 10*3/uL (ref 0.0–0.1)
EOS ABS: 0.1 10*3/uL (ref 0.0–0.7)
Eosinophils Relative: 1 % (ref 0–5)
HCT: 35 % — ABNORMAL LOW (ref 39.0–52.0)
Hemoglobin: 11.7 g/dL — ABNORMAL LOW (ref 13.0–17.0)
Lymphocytes Relative: 9 % — ABNORMAL LOW (ref 12–46)
Lymphs Abs: 1.1 10*3/uL (ref 0.7–4.0)
MCH: 30.9 pg (ref 26.0–34.0)
MCHC: 33.4 g/dL (ref 30.0–36.0)
MCV: 92.3 fL (ref 78.0–100.0)
Monocytes Absolute: 0.9 10*3/uL (ref 0.1–1.0)
Monocytes Relative: 7 % (ref 3–12)
NEUTROS PCT: 83 % — AB (ref 43–77)
Neutro Abs: 10.3 10*3/uL — ABNORMAL HIGH (ref 1.7–7.7)
Platelets: 360 10*3/uL (ref 150–400)
RBC: 3.79 MIL/uL — ABNORMAL LOW (ref 4.22–5.81)
RDW: 14.5 % (ref 11.5–15.5)
WBC: 12.4 10*3/uL — ABNORMAL HIGH (ref 4.0–10.5)

## 2013-03-08 LAB — COMPREHENSIVE METABOLIC PANEL
ALBUMIN: 2.9 g/dL — AB (ref 3.5–5.2)
ALK PHOS: 107 U/L (ref 39–117)
ALT: 29 U/L (ref 0–53)
AST: 24 U/L (ref 0–37)
BUN: 13 mg/dL (ref 6–23)
CO2: 25 mEq/L (ref 19–32)
Calcium: 8.5 mg/dL (ref 8.4–10.5)
Chloride: 98 mEq/L (ref 96–112)
Creatinine, Ser: 0.98 mg/dL (ref 0.50–1.35)
GFR calc Af Amer: 77 mL/min — ABNORMAL LOW (ref >90)
GFR calc non Af Amer: 67 mL/min — ABNORMAL LOW (ref >90)
Glucose, Bld: 115 mg/dL — ABNORMAL HIGH (ref 70–99)
POTASSIUM: 4.3 meq/L (ref 3.7–5.3)
SODIUM: 136 meq/L — AB (ref 137–147)
Total Bilirubin: 0.6 mg/dL (ref 0.3–1.2)
Total Protein: 6.4 g/dL (ref 6.0–8.3)

## 2013-03-08 LAB — URINALYSIS W MICROSCOPIC + REFLEX CULTURE
Bilirubin Urine: NEGATIVE
Glucose, UA: NEGATIVE mg/dL
Hgb urine dipstick: NEGATIVE
KETONES UR: NEGATIVE mg/dL
LEUKOCYTES UA: NEGATIVE
NITRITE: NEGATIVE
PROTEIN: NEGATIVE mg/dL
Specific Gravity, Urine: 1.01 (ref 1.005–1.030)
Urobilinogen, UA: 0.2 mg/dL (ref 0.0–1.0)
pH: 6.5 (ref 5.0–8.0)

## 2013-03-08 LAB — PRO B NATRIURETIC PEPTIDE: PRO B NATRI PEPTIDE: 1259 pg/mL — AB (ref 0–450)

## 2013-03-08 LAB — TROPONIN I: Troponin I: <0.3 ng/mL (ref <0.30)

## 2013-03-08 LAB — LIPASE, BLOOD: Lipase: 18 U/L (ref 11–59)

## 2013-03-08 LAB — LACTIC ACID, PLASMA: Lactic Acid, Venous: 1 mmol/L (ref 0.5–2.2)

## 2013-03-08 MED ORDER — ONDANSETRON HCL 4 MG/2ML IJ SOLN
4.0000 mg | INTRAMUSCULAR | Status: DC | PRN
  Administered 2013-03-08: 4 mg via INTRAVENOUS
  Filled 2013-03-08: qty 2

## 2013-03-08 MED ORDER — MORPHINE SULFATE 2 MG/ML IJ SOLN
1.0000 mg | Freq: Once | INTRAMUSCULAR | Status: AC
  Administered 2013-03-08: 1 mg via INTRAVENOUS
  Filled 2013-03-08: qty 1

## 2013-03-08 MED ORDER — IOHEXOL 300 MG/ML  SOLN
50.0000 mL | Freq: Once | INTRAMUSCULAR | Status: AC | PRN
  Administered 2013-03-08: 50 mL via ORAL

## 2013-03-08 MED ORDER — IOHEXOL 300 MG/ML  SOLN
100.0000 mL | Freq: Once | INTRAMUSCULAR | Status: AC | PRN
  Administered 2013-03-08: 100 mL via INTRAVENOUS

## 2013-03-08 MED ORDER — SODIUM CHLORIDE 0.9 % IV SOLN: INTRAVENOUS | Status: DC

## 2013-03-08 NOTE — Discharge Instructions (Signed)
°Emergency Department Resource Guide °1) Find a Doctor and Pay Out of Pocket °Although you won't have to find out who is covered by your insurance plan, it is a good idea to ask around and get recommendations. You will then need to call the office and see if the doctor you have chosen will accept you as a new patient and what types of options they offer for patients who are self-pay. Some doctors offer discounts or will set up payment plans for their patients who do not have insurance, but you will need to ask so you aren't surprised when you get to your appointment. ° °2) Contact Your Local Health Department °Not all health departments have doctors that can see patients for sick visits, but many do, so it is worth a call to see if yours does. If you don't know where your local health department is, you can check in your phone book. The CDC also has a tool to help you locate your state's health department, and many state websites also have listings of all of their local health departments. ° °3) Find a Walk-in Clinic °If your illness is not likely to be very severe or complicated, you may want to try a walk in clinic. These are popping up all over the country in pharmacies, drugstores, and shopping centers. They're usually staffed by nurse practitioners or physician assistants that have been trained to treat common illnesses and complaints. They're usually fairly quick and inexpensive. However, if you have serious medical issues or chronic medical problems, these are probably not your best option. ° °No Primary Care Doctor: °- Call [facility] at  [phone] - they can help you locate a primary care doctor that  accepts your insurance, provides certain services, etc. °- Physician Referral Service- 1-800-533-3463 ° °Chronic Pain Problems: °Organization         Address  Phone   Notes  °[facility]  ([phone]) [phone] Patients need to be referred by their primary care doctor.  ° °Medication  Assistance: °Organization         Address  Phone   Notes  °[place] [facility] [address] °[place], [place] [address] (336) 641-8030 --Must be a resident of [place] °-- Must have NO insurance coverage whatsoever (no Medicaid/ Medicare, etc.) °-- The pt. MUST have a primary care doctor that directs their care regularly and follows them in the community °  °[facility]  (866) 331-1348   °[employer]  (888) 892-1162   ° °Agencies that provide inexpensive medical care: °Organization         Address  Phone   Notes  °[facility]  (336) 832-8035   °[facility]    (336) 832-7272   °[facility] [address] °[place], [place] [address] (336) 832-4777   °[facility] [address], °[place] (336) 271-4999   °[facility]    (336) 373-0678   °[facility]    (336) 272-1050   °[facility] ° [address], [place] Phone:  (336) 832-4444, Fax:  (336) 832-4440 Hours of Operation:  9 am - 6 pm, M-F.  Also accepts Medicaid/Medicare and self-pay.  °[facility] ° [address], [place] Phone: (336) 832-3150, Fax: (336) [phone]. Hours of Operation:  8:30 am - 5:30 pm, M-F.  Also accepts Medicaid and self-pay.  °[facility] [place] [address], [place] Phone: (336) 878-6027   °[facility] [address], [place], [place] (336)723-1848, Ext. 123 Mondays & Thursdays: 7-9 AM.  First 15 patients are seen on a first come, first serve basis. °  ° °Medicaid-accepting [place] Providers: ° °Organization         Address  Phone   Notes  °[facility] [address], [place] (336) 641-2100 Also accepts self-pay patients.  °[facility] [address], [place] ° (336) 856-9996   °[facility] [address], [place]  (336) 288-8857   °[facility] [address] [place] [address], [place] (336) 299-7000   °Veita Bland [address], [place]  ° (336) 373-1557 Only accepts [place] Access Medicaid patients after they have their name applied to their card.  ° °Self-Pay (no insurance) in [place]: ° °Organization         Address  Phone   Notes  °Sickle Cell Patients, [facility] [address], [place] (336) 832-1970   °[facility] [address], [place] (336) 832-4400   °[facility] ° [address], [place] (336) 992-4800   °[facility]/Dr. Osei-Bonsu ° [address] [place] [address], [place] or [address], [place] (336) [phone] Phone number for both [place] and [place] locations is the same.  °[facility] [address], [place] (336) 299-0000   °[facility] [address] [place] [address], [place] or [address] (336) 852-7530 °(336) 878-2260   °[facility] [address], [place] (336) 350-1642, phone; (336) 294-5005, fax Sees patients 1st and 3rd Saturday of every month.  Must not qualify for public or private insurance (i.e. Medicaid, Medicare, [place] Health Choice, Veterans' Benefits) • Household income should be no more than 200% of the poverty level •The clinic cannot treat you if you are pregnant or think you are pregnant • Sexually transmitted diseases are not treated at the clinic.  ° ° °Dental Care: °Organization         Address  Phone  Notes  °[place] [facility] [address], [place] (336) 641-6152 Accepts children up to age 21 who are enrolled in Medicaid or [place] Health Choice; pregnant women with a Medicaid card; and children who have applied for Medicaid or [place] Health Choice, but were declined, whose parents can pay a reduced fee at time of service.  °[place]  [facility] [place]  [address], [place] (336) 641-7733 Accepts children up to age 21 who are enrolled in Medicaid or [place] Health Choice; pregnant women with a Medicaid card; and children who have applied for Medicaid or [place] Health Choice, but were declined, whose parents can pay a reduced fee at time of service.  °[facility] ° [address],  (336) 641-4533 Patients are seen by appointment only. Walk-ins are not accepted. [facility] will see patients 18 years of age and older. °Monday - Tuesday (8am-5pm) °Most Wednesdays (8:30-5pm) °$30 per visit, cash only  °[facility] ° [address], [place] (336) 641-4533 Patients are seen by appointment only. Walk-ins are not accepted. [facility] will see patients 18 years of age and older. °One   Wednesday Evening (Monthly: Volunteer Based).  $30 per visit, cash only  °[facility]  (919) [phone] for adults; Children under age 4, call Graduate Pediatric Dentistry at ([phone]) [phone]. Children aged 4-14, please call ([phone]) [phone] to request a pediatric application. ° Dental services are provided in all areas of dental care including fillings, crowns and bridges, complete and partial dentures, implants, gum treatment, root canals, and extractions. Preventive care is also provided. Treatment is provided to both adults and children. °Patients are selected via a lottery and there is often a waiting list. °  °[facility] [address], °[place] ° (336) 763-8833 [email or web address].drcivils.[email or web address] °  °[facility] [address], [place], [place] (336)723-1848, Ext. 123 Second and Fourth Thursday of each month, opens at 6:30 AM; Clinic ends at 9 AM.  Patients are seen on a first-come first-served basis, and a limited number are seen during each clinic.  ° °[facility] ° [address], [place], [place] (336) [phone]    Eligibility Requirements °You must have lived in [place], [place], or [place] counties for at least the last three months. °  You cannot be eligible for state or federal sponsored healthcare insurance, including Veterans Administration, Medicaid, or Medicare. °  You generally cannot be eligible for healthcare insurance through your employer.  °  How to apply: °Eligibility screenings are held every Tuesday and Wednesday afternoon from 1:00 pm until 4:00 pm. You do not need an appointment for the interview!  °[facility] [address], [place], [place] 336-631-2330   °[facility]  336-342-8273   °[facility]  336-703-3100   °[facility]  336-570-6415   ° °Behavioral Health Resources in the Community: °Intensive Outpatient Programs °Organization         Address  Phone  Notes  °[facility] [address], [place], [place] 336-878-6098   °[facility] [address], [place], [place] 336-832-9800   °[facility] [address], [place], [place] ° 336-882-2125   °[facility] [address],  °[place], [place] 1-800-853-5163 or 336-641-4981   °Substance Abuse Resources °Organization         Address  Phone  Notes  °[facility]  336-882-2125   °[facility]  336-784-9470   °[facility]  336-285-9073   °[facility]  336-845-3988   °Residential & Outpatient Substance Abuse Program  1-800-659-3381   °Psychological Services °Organization         Address  Phone  Notes  °[facility]  336- 832-9600   °[facility]  336- 378-7881   °[facility] [address], [place] 1-800-853-5163 or 336-641-4981   ° °Mobile Crisis Teams °Organization         Address  Phone  Notes  °[facility], Mobile Crisis Care Unit  1-877-626-1772   °Assertive °[facility] ° [address]  [place], [place] 336-834-9664   °Sharon DeEsch [address] °[place] 336-554-5454   ° °Self-Help/Support Groups °Organization         Address  Phone             Notes  °[facility] [place] [facility] - variety of support groups  336- 373-1402 Call for more information  °Narcotics Anonymous (NA), [facility] [address], °[place]  2 meetings at this location  ° °  Residential Treatment Programs Organization         Address  Phone  Notes  [facility] [address],    [place]  1-905-086-2682   [facility]  [address], [place] [address], [place], [place]   [place], [place] 276-570-2503 Admissions: 8am-3pm M-F  [facility] [place] [address]    [place], [place] [phone]   [facility] [address] [place], [place], [place]   [facility] [facility] [address]  [place], [place]   [facility] - Intensive Out[address], [place], [place]   [facility] [place] [employer]  [place], [place] 1-4403681578 or 413-733-6705   [facility] [address] [place], [place] Accepts Medicaid  [facility] [address]  [place] 1-276-830-1391 Substance Abuse/Addiction Treatment   [facility]         Address  Phone  Notes  [facility]  (239)887-8463   Domenic Schwab, PhD [address] [place] [address] [place], [place]   775-622-2727 or [phone]   [place], [place] [phone]   [facility] [address] [place], [place] (763)[phone] Insurance/Medicaid/sponsorship through [facility] and Families [address]                                    [place], [place] [phone] [place] [address] [place], [place] [phone]    Dr. Adele Schilder  [phone]   [facility] Dept. 1) [address] [place] 2) [place] 3)  [place] [address], [place] [phone]  [phone]   [place] [phone] or [phone] (After Hours)       Take your usual prescriptions as previously directed. Keep the foley catheter in place until you are seen in follow up. Call your regular medical doctor today to schedule a follow up appointment within the next 2 days.  Call the Urologist today to schedule a follow up appointment within the next week. Return to the Emergency Department immediately sooner if worsening.

## 2013-03-08 NOTE — ED Notes (Signed)
Pt drinking contrast. Pt fell asleep and spilled part of contrast on gown. nad noted.

## 2013-03-08 NOTE — ED Provider Notes (Signed)
CSN: [id number]     Arrival date & time 03/08/13  4403 History   First MD Initiated Contact with Patient 03/08/13 [phone]     Chief Complaint  Patient presents with  . Abdominal Pain    HPI Pt was seen at 0945. Per EMS, NH report, and pt, c/o gradual onset and persistence of constant abd "pain" since last night. Pt describes the pain as "sharp" and "it feels like when I had a kidney stone." Denies CP/SOB, no back pain, no N/V/D, no fevers, no rash, no dysuria/hematuria, no testicular pain/swelling.    Past Medical History  Diagnosis Date  . Anxiety   . HTN (hypertension)   . Reflux   . DJD (degenerative joint disease), cervical   . Osteoarthritis   . Skin cancer, basal cell   . Diverticulitis   . Hyperlipidemia   . Inguinal hernia   . Ischemic heart disease   . Hiatal hernia   . Kidney stone   . Esophageal stricture   . History of recurrent TIAs   . Restless leg syndrome   . GERD (gastroesophageal reflux disease)   . Pleural effusion    Past Surgical History  Procedure Laterality Date  . Intestines    . Exploratory laparotomy w/ bowel resection    . Esophageal dilation    . Fracture surgery    . Hemiarthroplasty hip     Family History  Problem Relation Age of Onset  . Arthritis     History  Substance Use Topics  . Smoking status: Never Smoker   . Smokeless tobacco: Not on file  . Alcohol Use: No    Review of Systems ROS: Statement: All systems negative except as marked or noted in the HPI; Constitutional: Negative for fever and chills. ; ; Eyes: Negative for eye pain, redness and discharge. ; ; ENMT: Negative for ear pain, hoarseness, nasal congestion, sinus pressure and sore throat. ; ; Cardiovascular: Negative for chest pain, palpitations, diaphoresis, dyspnea and peripheral edema. ; ; Respiratory: Negative for cough, wheezing and stridor. ; ; Gastrointestinal: +abd pain. Negative for nausea, vomiting, diarrhea, blood in stool, hematemesis, jaundice and rectal  bleeding. . ; ; Genitourinary: Negative for dysuria, flank pain and hematuria. ; ; Genital:  No penile drainage or rash, no testicular pain or swelling, no scrotal rash or swelling.;;  Musculoskeletal: Negative for back pain and neck pain. Negative for swelling and trauma.; ; Skin: Negative for pruritus, rash, abrasions, blisters, bruising and skin lesion.; ; Neuro: Negative for headache, lightheadedness and neck stiffness. Negative for weakness, altered level of consciousness , altered mental status, extremity weakness, paresthesias, involuntary movement, seizure and syncope.     Allergies  Celebrex and Codeine  Home Medications   Current Outpatient Rx  Name  Route  Sig  Dispense  Refill  . aspirin 325 MG tablet   Oral   Take 325 mg by mouth 2 (two) times daily.         . furosemide (LASIX) 20 MG tablet   Oral   Take 20 mg by mouth daily.         Marland Kitchen HYDROcodone-acetaminophen (NORCO/VICODIN) 5-325 MG per tablet   Oral   Take 1 tablet by mouth every 6 (six) hours as needed for moderate pain.         Marland Kitchen ipratropium-albuterol (DUONEB) 0.5-2.5 (3) MG/3ML SOLN   Nebulization   Take 3 mLs by nebulization every 6 (six) hours as needed (shortness of breath).         Marland Kitchen  Nutritional Supplements (ENSURE CLEAR) LIQD   Oral   Take 200 mLs by mouth 2 (two) times daily.         Marland Kitchen omeprazole (PRILOSEC) 40 MG capsule   Oral   Take 40 mg by mouth daily.           . potassium chloride (K-DUR,KLOR-CON) 10 MEQ tablet   Oral   Take 10 mEq by mouth daily.         Marland Kitchen rOPINIRole (REQUIP) 1 MG tablet   Oral   Take 1 mg by mouth 2 (two) times daily.         . sertraline (ZOLOFT) 50 MG tablet   Oral   Take 50 mg by mouth daily.           . tamsulosin (FLOMAX) 0.4 MG CAPS capsule   Oral   Take 0.4 mg by mouth daily.          BP 128/49  Pulse 68  Temp(Src) 97.6 F (36.4 C) (Oral)  Resp 18  Ht 5\' 11"  (1.803 m)  Wt 170 lb (77.111 kg)  BMI 23.72 kg/m2  SpO2 97% Physical  Exam 0950: Physical examination:  Nursing notes reviewed; Vital signs and O2 SAT reviewed;  Constitutional: Well developed, Well nourished, Well hydrated, In no acute distress; Head:  Normocephalic, atraumatic; Eyes: EOMI, PERRL, No scleral icterus; ENMT: Mouth and pharynx normal, Mucous membranes moist; Neck: Supple, Full range of motion, No lymphadenopathy; Cardiovascular: Regular rate and rhythm, No gallop; Respiratory: Breath sounds clear & equal bilaterally, No wheezes.  Speaking full sentences with ease, Normal respiratory effort/excursion; Chest: Nontender, Movement normal; Abdomen: +mild diffuse tenderness to palp, +softly distended and tympanitic. Normal bowel sounds; Genitourinary: No CVA tenderness; Extremities: Pulses normal, No tenderness, No edema, No calf edema or asymmetry.; Neuro: AA&Ox3, Major CN grossly intact.  Speech clear. No gross focal motor or sensory deficits in extremities.; Skin: Color normal, Warm, Dry.    ED Course  Procedures   EKG Interpretation    Date/Time:  Friday March 08 2013 10:35:52 EST Ventricular Rate:  68 PR Interval:  182 QRS Duration: 134 QT Interval:  460 QTC Calculation: 489 R Axis:   -70 Text Interpretation:  Normal sinus rhythm Left axis deviation Right bundle branch block Left anterior fascicular block Bifascicular block  Abnormal ECG No previous ECGs available Confirmed by [facility]  MD, Nunzio Cory [phone]) on 03/08/2013 11:01:02 AM            MDM  MDM Reviewed: previous chart, nursing note and vitals Reviewed previous: labs and ECG Interpretation: labs, ECG, x-ray and CT scan   Results for orders placed during the hospital encounter of 03/08/13  URINALYSIS W MICROSCOPIC + REFLEX CULTURE      Result Value Range   Color, Urine YELLOW  YELLOW   APPearance CLEAR  CLEAR   Specific Gravity, Urine 1.010  1.005 - 1.030   pH 6.5  5.0 - 8.0   Glucose, UA NEGATIVE  NEGATIVE mg/dL   Hgb urine dipstick NEGATIVE  NEGATIVE   Bilirubin Urine  NEGATIVE  NEGATIVE   Ketones, ur NEGATIVE  NEGATIVE mg/dL   Protein, ur NEGATIVE  NEGATIVE mg/dL   Urobilinogen, UA 0.2  0.0 - 1.0 mg/dL   Nitrite NEGATIVE  NEGATIVE   Leukocytes, UA NEGATIVE  NEGATIVE   Bacteria, UA RARE  RARE  CBC WITH DIFFERENTIAL      Result Value Range   WBC 12.4 (*) 4.0 - 10.5 K/uL   RBC 3.79 (*)  4.22 - 5.81 MIL/uL   Hemoglobin 11.7 (*) 13.0 - 17.0 g/dL   HCT 35.0 (*) 39.0 - 52.0 %   MCV 92.3  78.0 - 100.0 fL   MCH 30.9  26.0 - 34.0 pg   MCHC 33.4  30.0 - 36.0 g/dL   RDW 14.5  11.5 - 15.5 %   Platelets 360  150 - 400 K/uL   Neutrophils Relative % 83 (*) 43 - 77 %   Neutro Abs 10.3 (*) 1.7 - 7.7 K/uL   Lymphocytes Relative 9 (*) 12 - 46 %   Lymphs Abs 1.1  0.7 - 4.0 K/uL   Monocytes Relative 7  3 - 12 %   Monocytes Absolute 0.9  0.1 - 1.0 K/uL   Eosinophils Relative 1  0 - 5 %   Eosinophils Absolute 0.1  0.0 - 0.7 K/uL   Basophils Relative 0  0 - 1 %   Basophils Absolute 0.0  0.0 - 0.1 K/uL  COMPREHENSIVE METABOLIC PANEL      Result Value Range   Sodium 136 (*) 137 - 147 mEq/L   Potassium 4.3  3.7 - 5.3 mEq/L   Chloride 98  96 - 112 mEq/L   CO2 25  19 - 32 mEq/L   Glucose, Bld 115 (*) 70 - 99 mg/dL   BUN 13  6 - 23 mg/dL   Creatinine, Ser 0.98  0.50 - 1.35 mg/dL   Calcium 8.5  8.4 - 10.5 mg/dL   Total Protein 6.4  6.0 - 8.3 g/dL   Albumin 2.9 (*) 3.5 - 5.2 g/dL   AST 24  0 - 37 U/L   ALT 29  0 - 53 U/L   Alkaline Phosphatase 107  39 - 117 U/L   Total Bilirubin 0.6  0.3 - 1.2 mg/dL   GFR calc non Af Amer 67 (*) >90 mL/min   GFR calc Af Amer 77 (*) >90 mL/min  LIPASE, BLOOD      Result Value Range   Lipase 18  11 - 59 U/L  LACTIC ACID, PLASMA      Result Value Range   Lactic Acid, Venous 1.0  0.5 - 2.2 mmol/L  TROPONIN I      Result Value Range   Troponin I <0.30  <0.30 ng/mL  PRO B NATRIURETIC PEPTIDE      Result Value Range   Pro B Natriuretic peptide (BNP) 1259.0 (*) 0 - 450 pg/mL   Ct Abdomen Pelvis W Contrast 03/08/2013   CLINICAL  DATA:  Abdominal pain  EXAM: CT ABDOMEN AND PELVIS WITH CONTRAST  TECHNIQUE: Multidetector CT imaging of the abdomen and pelvis was performed using the standard protocol following bolus administration of intravenous contrast.  CONTRAST:  136mL OMNIPAQUE IOHEXOL 300 MG/ML  SOLN  COMPARISON:  None.  FINDINGS: Moderate bilateral pleural effusions. A bibasilar dependent atelectasis.  Small hiatal hernia is suspected.  Benign-appearing lobulated liver cyst in the left lobe.  Hyperdense sludge versus layering gallstones. No gallbladder wall thickening.  Pancreas and adrenal glands are unremarkable.  Atrophic changes of the kidneys.  Bladder is moderately distended. Large amount of stool in the rectal vault.  Scoliosis and degenerative changes in the lumbar spine. No acute vertebral compression deformity.  Three vessel coronary artery calcifications.  Laxity of the anterior abdominal wall without focal hernia.  There is plaque in the aorta, SMA, and iliac arteries. Significant narrowing at the origin of the right common iliac artery is suspected.  Left total hip arthroplasty.  IMPRESSION: Bilateral pleural effusions and bibasilar atelectasis.  Sludge versus layering gallstones.  Prominent stool in the rectum.  Bladder distention.  Atherosclerotic changes as described.   Electronically Signed   By: Maryclare Bean M.D.   On: 03/08/2013 12:20   Dg Chest Port 1 View 03/08/2013   CLINICAL DATA:  Pain.  EXAM: PORTABLE CHEST - 1 VIEW  COMPARISON:  03/04/2013.  FINDINGS: Mediastinum and hilar structures normal. Mild improvement in basilar atelectasis and infiltrates. Improvement of bilateral pleural effusions. Heart size stable. No pulmonary venous congestion. No acute osseous abnormality.  IMPRESSION: Interim partial clearing of basilar atelectasis /infiltrates and bilateral pleural effusions.   Electronically Signed   By: Marcello Moores  Register   On: 03/08/2013 11:01    1430:  Per NH notes dated 03/04/13 and 03/07/13: pt has been  receiving lasix for bilateral pleural effusions, BNP 2132, and been wearing 2L O2 N/C with Sats 95%.  BNP and pleural effusions on CXR today improved from previous. Sats improved on R/A (94%) compared with NH data, as well as remain 99% on O2 2L N/C (again improved from NH data of 95-97%).  H/H stable. ED RN initially emptied bladder of approx 627ml urine for UA/UC, then placed foley catheter after CT scan revealed distended bladder. Total 1171ml urine emptied from bladder. Pt states he "feels better now," and abd is soft/NT. Pt and family would like pt to be sent back to NH. Pt already taking flomax. Will continue foley catheter while at [facility] until Uro MD f/u. Dx and testing d/w pt and family.  Questions answered.  Verb understanding, agreeable to d/c home with outpt f/u.      Alfonzo Feller, DO 03/11/13 [phone]

## 2013-03-08 NOTE — ED Notes (Signed)
Pt family left pt room and reported would be back. Pt daughter, left contact information. Pt daughter, Mort Sawyers [phone].

## 2013-03-08 NOTE — ED Notes (Signed)
V/S obtained with pt on room air.

## 2013-03-08 NOTE — ED Notes (Addendum)
Pt reports severe abdominal pain since last night. Pt alert and oriented. Pt reports abdominal pain "feels like a kidney stone." Pt denies any n/v/d. nad noted.no active vomiting noted.

## 2013-03-08 NOTE — ED Notes (Signed)
Per EDP order, pt trialed on room air. Pt o2 sats maintaing >94%. Pt denies wearing oxygen at home.

## 2013-03-10 DIAGNOSIS — N400 Enlarged prostate without lower urinary tract symptoms: Secondary | ICD-10-CM | POA: Insufficient documentation

## 2013-03-10 DIAGNOSIS — F329 Major depressive disorder, single episode, unspecified: Secondary | ICD-10-CM | POA: Insufficient documentation

## 2013-03-10 DIAGNOSIS — F32A Depression, unspecified: Secondary | ICD-10-CM | POA: Insufficient documentation

## 2013-03-10 DIAGNOSIS — G2581 Restless legs syndrome: Secondary | ICD-10-CM | POA: Insufficient documentation

## 2013-03-10 DIAGNOSIS — S72009D Fracture of unspecified part of neck of unspecified femur, subsequent encounter for closed fracture with routine healing: Secondary | ICD-10-CM | POA: Insufficient documentation

## 2013-03-10 NOTE — Progress Notes (Signed)
Patient ID: Patrick [name], male   DOB: [birth year], 80 y.o.   MRN: [id number]    this is an acute visit  Level care skilled.  Facility [facility].  Chief complaint-acute visit status post hospitalization for left hip fracture with repair.  History of present illness.  Patient is a very pleasant 80 year old male who was admitted to [facility]  after sustaining a fall --he was diagnosed with a left femur neck fracture  This was surgically repaired with a heme arthroplasty apparently he did quite well with this.  He is here for strengthening and physical therapy.  His other diagnoses include BPH--he is on Flomax and restless legs which he is on Requip for  He also has a history of depression and is on Zoloft.  Apparently shortly after his admission here he did have hypoxia with O2 saturation in the mid 80s-chest x-ray showed moderate bilateral effusions with bilateral passive atelectasis-he has been placed on DuoNeb every 6 hours as well as spirometry-there are been no further hypoxic episodes he does not complaining of cough or shortness of breath this evening.  Previous medical history.  History of left femur fracture status post repair.  BPH.  Restless legs.  Depression.  History of cervical DJD.  Osteoarthritis.  History of basal cell carcinoma.  Diverticulosis.  Hyperlipidemia.  Right inguinal hernia.  Ischemic heart disease.  Hiatal hernia.  History of kidney stones.  History of esophageal stricture and dilation in the past.  Recurrent TIAs.  Recurrent diverticulitis and perforation and exploratory laparotomy and sigmoid resection in June 2011.  GERD.  Hypertension.  Social history-patient apparently lived in an assisted living facility--he has a supportive son who lives in [place].  Has a very distant history of tobacco use almost 50 years ago no history of alcohol abuse.  Family history not pertinent.   Medication  ReTemps on a  low-salts  Flomax 0.4 mg daily.  Prilosec 40 mg daily.  Requip 1 mg by mouth twice a day.  Zoloft 50 mg daily.  Vicodin 5-325 mg every 6 hours when necessary pain.  Aspirin 325 mg twice a day for 2 weeks then reduce to one time a day  Review of systems   In general denies any fever chills has no complaints this evening  Skin does not complaining of any itching or rashes does have a history of basal cell carcinoma.  Head ears eyes nose mouth and throat-does not complaining of any sore throat visual changes nasal discharge  .  Respiratory no complaints of shortness of breath or cough-did have hypoxic episode earlier no reoccurrence.  Cardiac- does have a history of coronary artery disease but does not complain of any chest pain does not really have significant lower extremity edema  GI-does not complaining of abdominal pain nausea vomiting diarrhea or constipation does have a history of an inguinal hernia.  GU-does not complaining of dysuria.  Muscle skeletal-says his hip pain joint pain is controlled .  Neurologic-does not complaining of any headache dizziness numbness-does have a history of restless leg.  Psych history of depression does not appear to be depressed this evening is in good spirits does not complaining of depression or anxiety.  Physical exam.  Temperature is 97.7 pulse is 73 respirations 18 blood pressure 128/70 O2 saturation is in the 90s on 2 L oxygen  In general this is a frail but quite alert and cognitively intact elderly male.  The skin is warm and dry he does have numerous sun-induced changes -  surgical site left hip Staples are in place I do not see any significant erythema or drainage or sign of infection this looks quite benign  Eyes pupils appear equal round react to light sclera and conjunctiva are clear he has prescription lenses visual acuity appears grossly intact.  Oropharynx is clear mucous membranes moist.  Chest he has decreased  breath sounds at the bases but no rhonchi rales or wheezes no labored breathing.  Heart is regular rate and rhythm without murmur gallop or rub he is minimal lower extremity edema bilaterally somewhat reduced pedal pulses on the left.  Abdomen is protuberant soft nontender he does have a well-healed surgical scar also note a right sided hernia.  Muscle- skeletal he has a leg immobilizer on the left leg-surgical site as noted above-otherwise moves all extremities x3 grip strength is good and appears to be intact  Neurologic-is grossly intact no focal deficits his speech is clear no lateralizing findings.  Psych he is alert and oriented x3 pleasant and appropriate.  Labs.  Generally 17 2015.  WBC 9.1 hemoglobin 11.0 platelets 195.  Sodium 141 potassium 3.9 BUN 14 creatinine 0.82.   calcium 8.0.  Assessment and plan.  #1-left hip fracture with repair-this appears to be stable will need therapy his pain appears to be controlled on the Vicodin--he is on aspirin consider starting Alen Blew this will be evaluated by Dr. Dellia Nims on Monday--- he appears to be quite stable in this regard  -#2-hypoxia-chest x-ray did show moderate bilateral effusions-Will start him on Lasix 20 mg a day for 3 days and have followup by Dr. Dellia Nims when he is in the facility-also will start low-dose potassium.  Will check a comprehensive metabolic panel BMP on Monday, January 19. Also monitor weights on Monday Wednesday and Friday notify provider gain greater than 3 pounds.  #3-history of restless legs this appears to be stable on Requip  #4-history of BPH-continues on Flomax apparently this has been stable.  #5-history of GERD-with hiatal hernia-he is on Prilosec this appears to be relatively controlled.  #6-history depression he is on Zoloft this appears to be quite stable   [id number]-of note greater than 40 minutes spent assessing patient-reviewing his hospital records-discussing his status with nursing  staff-and coordinating and formulating a plan of care-of note greater than 50% of time spent coordinating plan of care.  .        .  .

## 2013-03-15 ENCOUNTER — Ambulatory Visit (HOSPITAL_COMMUNITY)
Admission: RE | Admit: 2013-03-15 | Discharge: 2013-03-15 | Disposition: A | Discharge status: Home / Self Care | Payer: Medicare Other | Source: Ambulatory Visit | Attending: Internal Medicine | Admitting: Internal Medicine

## 2013-03-15 ENCOUNTER — Non-Acute Institutional Stay (SKILLED_NURSING_FACILITY): Payer: Medicare Other | Admitting: Internal Medicine

## 2013-03-15 ENCOUNTER — Other Ambulatory Visit (HOSPITAL_BASED_OUTPATIENT_CLINIC_OR_DEPARTMENT_OTHER): Payer: Self-pay | Admitting: Internal Medicine

## 2013-03-15 DIAGNOSIS — R52 Pain, unspecified: Secondary | ICD-10-CM

## 2013-03-15 DIAGNOSIS — R634 Abnormal weight loss: Secondary | ICD-10-CM | POA: Diagnosis not present

## 2013-03-15 DIAGNOSIS — F329 Major depressive disorder, single episode, unspecified: Secondary | ICD-10-CM | POA: Diagnosis not present

## 2013-03-15 DIAGNOSIS — S72009D Fracture of unspecified part of neck of unspecified femur, subsequent encounter for closed fracture with routine healing: Secondary | ICD-10-CM

## 2013-03-15 DIAGNOSIS — F3289 Other specified depressive episodes: Secondary | ICD-10-CM | POA: Diagnosis not present

## 2013-03-15 DIAGNOSIS — F32A Depression, unspecified: Secondary | ICD-10-CM

## 2013-03-15 DIAGNOSIS — M25559 Pain in unspecified hip: Secondary | ICD-10-CM | POA: Diagnosis not present

## 2013-03-16 DIAGNOSIS — R634 Abnormal weight loss: Secondary | ICD-10-CM | POA: Insufficient documentation

## 2013-03-16 NOTE — Progress Notes (Signed)
Patient ID: Patrick [name], male   DOB: 1913-10-14, 80 y.o.   MRN: [id number]   This is an acute visit.  Level of care skilled.  Facility [facility]  Date 03/15/2013.  Chief complaint-acute visit followup weight loss-depression-pain.  History of present illness.  Patient is a very pleasant 80 year old male here for rehabilitation after sustaining a left hip fracture that was surgically repaired apparently without much complication.  He was recently started on low-dose Lasix secondary to hypoxic episode with a chest x-ray that showed pleural effusions bilateral.  His O2 sats continued to be stable he does not complaining of shortness of breath.  Nursing staff has noted a significant weight loss--some of this may be fluid but I suspect it also reflects on a poor appetite.  It appears he's lost about 15 pounds in the past week to 10 days-family reports he's not eating much and thinks he may be depressed.  He is on Zoloft 50 mg a day-patient has no acute complaints of dysphasia stomach pain throat pain says he just doesn't have much of an appetite.  His vital signs are stable.  Family medical social history has been reviewed per admission note on 03/04/2013.  Medications have been reviewed per MAR.  Review of systems.  General denies any fever or chills.  Respiratory no shortness of breath or cough.  Cardiac no chest pain.  GI does not complaining of any nausea vomiting diarrhea constipation or abdominal pain.  GU does not complain of dysuria.  Muscle skeletal does complain of left hip discomfort apparently the Vicodin which he takes when necessary gives relief but he is complaining of hip pain again today.  Neurologic does not complain of headache dizziness or numbness.  Psych does have some recent history of depression he is on Zoloft-he does not overtly complain of being depressed but family is concerned.  Physical exam.  Doctors 97.9 pulse 84 respirations 18 blood  pressure 128/56 most recent weight 162.  In general this is a very pleasant elderly male who appears to be quite frail but in no distress lying comfortably in bed.  The skin is warm and dry surgical site left hip is currently covered according to hernursing stafft this appears to be stable no sign of infection around the staples--.  Chest is clear to auscultation with no labored breathing.  Heart is regular rate and rhythm without murmur gallop or rub.  Abdomen is soft nontender there are positive bowel sounds there is a hernia right side abdomen.  Extremities does have general frailty but moves extremities x4 limited range of motion of his left leg secondary to the hip repair-I did not note any deformity of the left hip on exam.  Neurologic is grossly intact no focal weaknesses or lateralizing findings his speech is clear.  Psych he appears grossly alert and oriented pleasant and appropriate does have somewhat of a week flat affect.  Labs.  03/11/2013.  Sodium 137 potassium 4.3 BUN 15 creatinine 1.04.  BMP-457.1.  03/04/2013.  Liver function tests within normal limits except albumin of 2.4.  1- 17 2015.   WBC 9.1 hemoglobin 11.0 platelets 195.  Assessment and plan.  #1-weight loss-suspect it could be an element of depression here he is on Zoloft 50 mg a day-will order a psychiatric consult.  Also will add Remeron as a supplement hopefully for appetite stimulation--acute monitor weights--dietary has placed him on supplements including Protostat twice a day   .  Also will check a comprehensive metabolic panel  to see where we stand with electrolytes as well as the albumin.--Also check CBC  #2-left hip pain-he actually had an orthopedic consult for today but that had to be canceled because of an emergency concerning the orthopedic surgeon-will have staff remove the staples since they have had adequate amount of time-Will have orthopedic followup when that can be arranged  also will order an x-ray secondary to some pain here although I suspect this may be typical postop discomfort---   [id number] .

## 2013-03-20 ENCOUNTER — Non-Acute Institutional Stay (SKILLED_NURSING_FACILITY): Payer: Medicare Other | Admitting: Internal Medicine

## 2013-03-20 DIAGNOSIS — R5381 Other malaise: Secondary | ICD-10-CM

## 2013-03-20 DIAGNOSIS — R634 Abnormal weight loss: Secondary | ICD-10-CM

## 2013-03-20 DIAGNOSIS — R5383 Other fatigue: Secondary | ICD-10-CM

## 2013-03-20 NOTE — Progress Notes (Signed)
Patient ID: Patrick [name], male   DOB: Jul 23, 1913, 80 y.o.   MRN: [id number]   This is an acute visit.  Level of care skilled.  Facility [facility]   .  Chief complaint-acute visit followup lethargy yesterday  .  History of present illness.  Patient is a very pleasant 80 year old male here for rehabilitation after sustaining a left hip fracture that was surgically repaired apparently without much complication.  He was recently started on low-dose Lasix secondary to hypoxic episode with a chest x-ray that showed pleural effusions bilateral.  His O2 sats continued to be stable he does not complaining of shortness of breath.--- Not really using oxygen much at all now  Nursing staff has noted a significant weight loss--some of this may be fluid but I suspect it also reflects on a poor appetite--his weight most recently 153.4 last week and was in the mid 160s.--There is some suspicion that he has some depression etiology for this-and he is now on     Zoloft 50 mg a day-patient has no acute complaints of dysphasia stomach pain throat pain says he just doesn't have much of an appetite.--We did start Remeron last week and actually apparently he ate 100% at breakfast this morning which is quite unusual.  There was concern yesterday  moring however apparently he was somewhat lethargic although vital signs were stable a urinalysis and culture was obtained results are pending--.  Today appears to be  better today he actually has been eating well which is unusual for him recently-vital signs are stable I took his blood pressure manually I got 102/58-at times he does run a bit on the low side systolically with occasional readings in the 32G systolically as he does not appear to be overtly symptomatic--- does not complaining of any dizziness headache or syncopal-type feelings although he does say he feels a bit weak at times during therapy.  Labwork was done yesterday was actually appear to be stable TSH also was  within normal limits albumin is 3.2 which is actually up a bit from previous levels--  .    Marland Kitchen  Family medical social history has been reviewed per admission note on 03/04/2013 .  Medications have been reviewed per MAR.   Review of systems.  General denies any fever or chills. Skin-does complain of some irritation under his neck appears to be in somewhat irritated seborrheic keratosis  Respiratory no shortness of breath or cough.  Cardiac no chest pain.  GI does not complaining of any nausea vomiting diarrhea constipation or abdominal pain.  GU does not complain of dysuria.  Muscle skeletal--does not complaining of hip pain today .  Neurologic does not complain of headache dizziness or numbness.or syncopal episodes  Psych does have some recent history of depression he is on Zoloft-he does not overtly complain of being depressed    Physical exam.  . temperature 97.2 pulse 65 respirations 20 blood pressure manually 102/58--weight is 153.4 In general this is a very pleasant elderly male who appears to be quite frail but in no distress sitting in his wheelchair.  The skin is warm and dry --does appear to have itn appears a seborrheic keratosis which appears to be scaling off --just undernmid neck area--  No  sign of cellulitis drainage bleeding or hyperpigmentation -.  Chest is clear to auscultation with no labored breathing.  Heart is regular rate and rhythm without murmur gallop or rub.  Abdomen is soft nontender there are positive bowel sounds there is a  hernia right side abdomen. GU-Foley is draining dark amber colored urine  Extremities does have general frailty but moves extremities x4 limited range of motion of his left leg secondary to the hip repair-I.  Neurologic is grossly intact no focal weaknesses or lateralizing findings his speech is clear.  Psych he appears grossly alert and oriented pleasant and appropriate does have somewhat of a week flat affect--but is pleasant and  conversant.   Labs. Feb- third 2015.  WBC 8.9 hemoglobin 13.1 platelets 240.  Sodium 140 potassium 4.9 BUN 22 creatinine 1.1.  Liver function tests within normal limits except alkaline phosphatase of 152 albumin of note is 3.2  TSH-2.403.  03/16/2013.  WBC 9.9 hemoglobin 12.7 platelets 327.  Sodium 136 potassium 4.5 BUN 18 creatinine 1.1.  Phosphates 137 albumin 2.9 otherwise liver function tests within normal limits.       03/11/2013.  Sodium 137 potassium 4.3 BUN 15 creatinine 1.04.  BMP-457.1.  03/04/2013.  Liver function tests within normal limits except albumin of 2.4.  1- 17 2015.   WBC 9.1 hemoglobin 11.0 platelets 195 .  Assessment and plan .  #1-lethargy-apparently this was a transient episode earlier yesterday-do note he was recently started on Remeron but he appears to be doing significantly better today and actually is eating quite well-labs were unremarkable a urine culture is pending-at this point will continue to monitor Hopefully  today is not an abnormality and he will continue to improve appetite-wise--his labs appear to be relatively baseline no elevated white count stable hemoglobin as well as electrolytes--  #2-dermatitis---appears to have an irritated seborrheic keratosis although this does not appear to be infected whatsoever have spoken with nursing staff and they will provide protective dressing appears it's more of an irritation thing  When  the patient puts on his shirt  #3-weight loss-this is substantial he has been put on supplementation by dietary-also started on Remeron appears to be doing significantly better today we will see if this continues.  #4-occasional low blood pressure readings----he does not appear to be overtly symptomatic-at this point will continue to monitor he is not on any blood pressure medication appears to be tolerating the Lasix  Well--. respiratory status is stable  [id number] --   .

## 2013-03-22 DIAGNOSIS — S72033A Displaced midcervical fracture of unspecified femur, initial encounter for closed fracture: Secondary | ICD-10-CM | POA: Diagnosis not present

## 2013-03-28 ENCOUNTER — Other Ambulatory Visit: Payer: Self-pay | Admitting: *Deleted

## 2013-03-28 MED ORDER — HYDROCODONE-ACETAMINOPHEN 5-325 MG PO TABS: ORAL_TABLET | ORAL | Status: DC

## 2013-03-28 NOTE — Telephone Encounter (Signed)
[facility] 

## 2013-04-01 ENCOUNTER — Non-Acute Institutional Stay (SKILLED_NURSING_FACILITY): Payer: Medicare Other | Admitting: Internal Medicine

## 2013-04-01 DIAGNOSIS — R634 Abnormal weight loss: Secondary | ICD-10-CM

## 2013-04-01 DIAGNOSIS — S72009D Fracture of unspecified part of neck of unspecified femur, subsequent encounter for closed fracture with routine healing: Secondary | ICD-10-CM

## 2013-04-01 DIAGNOSIS — G2581 Restless legs syndrome: Secondary | ICD-10-CM

## 2013-04-01 DIAGNOSIS — F3289 Other specified depressive episodes: Secondary | ICD-10-CM | POA: Diagnosis not present

## 2013-04-01 DIAGNOSIS — F32A Depression, unspecified: Secondary | ICD-10-CM

## 2013-04-01 DIAGNOSIS — N400 Enlarged prostate without lower urinary tract symptoms: Secondary | ICD-10-CM | POA: Diagnosis not present

## 2013-04-01 DIAGNOSIS — F329 Major depressive disorder, single episode, unspecified: Secondary | ICD-10-CM

## 2013-04-01 NOTE — Progress Notes (Signed)
Patient ID: Patrick [name], male   DOB: May 24, 1913, 80 y.o.   MRN: [id number]   this is an acute visit  Level care skilled.  Facility [facility] .  Chief complaint-Discharge note .  History of present illness.  Patient is a very pleasant 80 year old male who was admitted to [facility] after sustaining a fall --he was diagnosed with a left femur neck fracture  This was surgically repaired with an arthroplasty apparently he did quite well with this.  He was here for strengthening and physical therapy.  His other diagnoses include BPH--he is on Flomax--he did have problems with urinary retention and a Foley catheter has been inserted-also has  restless legs which he is on Requip for  He also has a history of depression and is on Zoloft.--He has been seen by psychiatric services they did switch his Zoloft from in the morning to each bedtime  Apparently shortly after his admission here he did have hypoxia with O2 saturation in the mid 80s-chest x-ray showed moderate bilateral effusions with bilateral passive atelectasis-he has been placed on DuoNeb every 6 hours as well as spirometry--he also has been started on low-dose Lasix with potassium supplementation-this appears to be relatively stable O2 saturations continued to be in the 90s he does not complaining of shortness of breath.  Note he did recently complete a course of ciprofloxacin for an Escherichia coli UTI  Patient appears to have had significant weight loss however-some of this may be fluid related - but also has had a poor appetite.  He has been started on Remeron and weight appears to have stabilized since the beginning of the month in the low 150s--he is on numerous supplements Protostat was just started he also is on Magic cup a day and Ensure twice a day--patient continues to  not complain  Of  any difficulty swallowing or abdominal discomfort  Patient does have a very supportive family that visit often-he is slated to be discharged  to an assisted living facility- .  Previous medical history.  History of left femur fracture status post repair.  BPH.  Restless legs.  Depression.  History of cervical DJD.  Osteoarthritis.  History of basal cell carcinoma.  Diverticulosis.  Hyperlipidemia.  Right inguinal hernia.  Ischemic heart disease.  Hiatal hernia.  History of kidney stones.  History of esophageal stricture and dilation in the past.  Recurrent TIAs.  Recurrent diverticulitis and perforation and exploratory laparotomy and sigmoid resection in June 2011.  GERD.  Hypertension.   Social history-patient apparently lived in an assisted living facility--he has a supportive son who lives in [place].  Has a very distant history of tobacco use almost 50 years ago no history of alcohol abuse.  Family history not pertinent.   Medication  Remeron 7.5 mg daily  Flomax 0.4 mg daily.  Prilosec 40 mg daily.  Requip 1 mg by mouth twice a day.  Zoloft 50 mg daily.  Vicodin 5-325 mg every 6 hours when necessary pain.  Aspirin 325 mg twice a day for 2 additional weeks-  Review of systems  In general denies any fever chills has no complaints this evening  Skin does not complaining of any itching or rashes does have a history of basal cell carcinoma.  Head ears eyes nose mouth and throat-does not complaining of any sore throat visual changes nasal discharge  .  Respiratory no complaints of shortness of breath or cough-did have hypoxic episode earlier no reoccurrence.  Cardiac- does have a history of  coronary artery disease but does not complain of any chest pain does not really have significant lower extremity edema  GI-does not complaining of abdominal pain nausea vomiting diarrhea or constipation does have a history of an inguinal hernia.  GU-does not complaining of dysuria. Has a Foley catheter in place  Muscle skeletal-says his hip pain joint pain is controlled says he really hasn't had any problems with pain with  it.  Neurologic-does not complaining of any headache dizziness numbness-does have a history of restless leg.  Psych history of depression .   Physical exam.  Temperature 97.2 pulse 80 respirations 20 blood pressure listed 87/55-151/84 in this range-I took it manually and got 110/60  --O2 saturations continued to be in the 90s on oxygen  In general this is a frail but quite alert and cognitively intact elderly male.  The skin is warm and dryn  Eyes pupils appear equal round react to light sclera and conjunctiva are clear he has prescription lenses visual acuity appears grossly intact.  Oropharynx is clear mucous membranes moist.  Chest he has decreased breath sounds at the bases but no rhonchi rales or wheezes no labored breathing.  Heart is regular rate and rhythm without murmur gallop or rub he is minimal lower extremity edema bilaterally somewhat reduced pedal pulses .  Abdomen is protuberant soft nontender he does have a well-healed surgical scar also note a right sided hernia.  Muscle- skeletal he has a leg immobilizer on the left leg-s-otherwise moves all extremities x3 grip strength is good and appears to be intact  Neurologic-is grossly intact no focal deficits his speech is clear no lateralizing findings.  Psych he is alert and oriented x3 pleasant and appropriate .  Labs  03/19/2013.  TSH-2.403.  WBC 8.9 hemoglobin 13.1 platelets 240.  Sodium 140 potassium 4.9 BUN 22 creatinine 1.10.  Liver function tests within normal limits except albumin of 3.2 and alkaline phosphatase of 152.  03/11/2013.  BNP-457.1.  1- 17 2015.  WBC 9.1 hemoglobin 11.0 platelets 195.  Sodium 141 potassium 3.9 BUN 14 creatinine 0.82.  calcium 8.0.  Assessment and plan.  #1-left hip fracture with repair-appears to be doing well with this he has seen orthopedics with orders to continue his medial immobilizer for 2 additional as well as aspirin twice a day for 2 additional  weeks.    -#2-hypoxia-chest x-ray did show moderate bilateral effusions-on low-dose Lasix this appears to be stable Will check metabolic panel before discharge   A.  #3-history of restless legs this appears to be stable on Requip  #4-history of BPH-continues on Flomax--and has a Foley catheter inserted this will need follow up  after discharge patient would like Foley discontinued at some point however would be hesitant to do this as he is about to leave.  #5-history of GERD-with hiatal hernia-he is on Prilosec this appears to be relatively controlled.  #6-history depression he is on Zoloft this appears to be quite stable #7-weight loss-this appears to have stabilized somewhat it was thought depression may be contributing to this and psychiatric services did see him and change to Zoloft to each bedtime dosing-he is on numerous supplements this will need followup once he is discharged as well     [id number]-of note greater than 30 minutes  spent preparing this discharge summary

## 2013-04-02 DIAGNOSIS — I10 Essential (primary) hypertension: Secondary | ICD-10-CM | POA: Diagnosis not present

## 2013-04-10 DIAGNOSIS — F329 Major depressive disorder, single episode, unspecified: Secondary | ICD-10-CM | POA: Diagnosis not present

## 2013-04-10 DIAGNOSIS — F3289 Other specified depressive episodes: Secondary | ICD-10-CM | POA: Diagnosis not present

## 2013-04-30 ENCOUNTER — Encounter: Payer: Self-pay | Admitting: *Deleted

## 2013-05-02 DIAGNOSIS — I70209 Unspecified atherosclerosis of native arteries of extremities, unspecified extremity: Secondary | ICD-10-CM | POA: Diagnosis not present

## 2013-05-04 DIAGNOSIS — N390 Urinary tract infection, site not specified: Secondary | ICD-10-CM | POA: Diagnosis not present

## 2013-06-04 ENCOUNTER — Other Ambulatory Visit: Payer: Self-pay | Admitting: *Deleted

## 2013-06-04 MED ORDER — HYDROCODONE-ACETAMINOPHEN 5-325 MG PO TABS: ORAL_TABLET | ORAL | Status: DC

## 2013-06-04 NOTE — Telephone Encounter (Signed)
[facility] 

## 2013-06-10 DIAGNOSIS — N390 Urinary tract infection, site not specified: Secondary | ICD-10-CM | POA: Diagnosis not present

## 2013-07-04 DIAGNOSIS — L570 Actinic keratosis: Secondary | ICD-10-CM | POA: Diagnosis not present

## 2013-07-04 DIAGNOSIS — C44211 Basal cell carcinoma of skin of unspecified ear and external auricular canal: Secondary | ICD-10-CM | POA: Diagnosis not present

## 2013-07-04 DIAGNOSIS — D045 Carcinoma in situ of skin of trunk: Secondary | ICD-10-CM | POA: Diagnosis not present

## 2013-07-26 DIAGNOSIS — F3289 Other specified depressive episodes: Secondary | ICD-10-CM | POA: Diagnosis not present

## 2013-07-26 DIAGNOSIS — F329 Major depressive disorder, single episode, unspecified: Secondary | ICD-10-CM | POA: Diagnosis not present

## 2013-08-03 ENCOUNTER — Other Ambulatory Visit (HOSPITAL_BASED_OUTPATIENT_CLINIC_OR_DEPARTMENT_OTHER): Payer: Self-pay | Admitting: Internal Medicine

## 2013-08-03 ENCOUNTER — Ambulatory Visit (HOSPITAL_COMMUNITY)
Admission: RE | Admit: 2013-08-03 | Discharge: 2013-08-03 | Disposition: A | Discharge status: Home / Self Care | Payer: Medicare Other | Source: Ambulatory Visit | Attending: Internal Medicine | Admitting: Internal Medicine

## 2013-08-03 ENCOUNTER — Non-Acute Institutional Stay (SKILLED_NURSING_FACILITY): Payer: Medicare Other | Admitting: Internal Medicine

## 2013-08-03 DIAGNOSIS — G2581 Restless legs syndrome: Secondary | ICD-10-CM

## 2013-08-03 DIAGNOSIS — R634 Abnormal weight loss: Secondary | ICD-10-CM | POA: Diagnosis not present

## 2013-08-03 DIAGNOSIS — R05 Cough: Secondary | ICD-10-CM | POA: Diagnosis not present

## 2013-08-03 DIAGNOSIS — N400 Enlarged prostate without lower urinary tract symptoms: Secondary | ICD-10-CM

## 2013-08-03 DIAGNOSIS — I259 Chronic ischemic heart disease, unspecified: Secondary | ICD-10-CM | POA: Diagnosis not present

## 2013-08-03 DIAGNOSIS — I2510 Atherosclerotic heart disease of native coronary artery without angina pectoris: Secondary | ICD-10-CM | POA: Diagnosis not present

## 2013-08-03 DIAGNOSIS — R059 Cough, unspecified: Secondary | ICD-10-CM

## 2013-08-03 DIAGNOSIS — F329 Major depressive disorder, single episode, unspecified: Secondary | ICD-10-CM | POA: Diagnosis not present

## 2013-08-03 DIAGNOSIS — S72009D Fracture of unspecified part of neck of unspecified femur, subsequent encounter for closed fracture with routine healing: Secondary | ICD-10-CM

## 2013-08-03 DIAGNOSIS — I2584 Coronary atherosclerosis due to calcified coronary lesion: Secondary | ICD-10-CM

## 2013-08-03 DIAGNOSIS — F3289 Other specified depressive episodes: Secondary | ICD-10-CM

## 2013-08-03 DIAGNOSIS — I10 Essential (primary) hypertension: Secondary | ICD-10-CM | POA: Insufficient documentation

## 2013-08-03 DIAGNOSIS — F32A Depression, unspecified: Secondary | ICD-10-CM

## 2013-08-03 DIAGNOSIS — I509 Heart failure, unspecified: Secondary | ICD-10-CM | POA: Insufficient documentation

## 2013-08-03 NOTE — Progress Notes (Signed)
Patient ID: [name], male   DOB: [birth year], 80 y.o.   MRN: [id number]   this is a routine visit  Level care skilled.  Facility [facility]  .  Chief complaint- medical management of chronic conditions including BPH-urinary retention-edema-depression-restless legs-history of left hip fracture-acute visit secondary to cough .  History of present illness.  Patient is a very pleasant 80year-old male who was admitted to [name] after sustaining a fall --he was diagnosed with a left femur neck fracture  This was surgically repaired with an arthroplasty apparently he did quite well with this.  He was here for strengthening and physical therapy Back back in February he was slated for discharge however arrangements were made at that time by his family apparently and he has remained in the facility.  His other diagnoses include BPH--he is on Flomax--he did have problems with urinary retention and a Foley catheter has been inserted-also has restless legs which he is on Requip for  He also has a history of depression and is on Zoloft.--He has been seen by psychiatric services they did switch his Zoloft from in the morning to each bedtime  Apparently shortly after his admission here he did have hypoxia with O2 saturation in the mid 80s-chest x-ray showed moderate bilateral effusions with bilateral passive atelectasis-he has been placed on DuoNeb every 6 hours  prn as well as spirometry--he also has been started on low-dose Lasix with potassium supplementation-this appears to be relatively stable O2 saturations continued to be in the 90s he does not complaining of shortness of breath--however he is complaining of a cough apparently fairly nonproductive.  When he first came here weight loss was a significant issue but this has stabilized he is on supplements including Magic cup current weight is 158.8--as appears to be stable    .  Previous medical history.  History of left femur fracture status  post repair.  BPH.  Restless legs.  Depression.  History of cervical DJD.  Osteoarthritis.  History of basal cell carcinoma.  Diverticulosis.  Hyperlipidemia.  Right inguinal hernia.  Ischemic heart disease.  Hiatal hernia.  History of kidney stones.  History of esophageal stricture and dilation in the past.  Recurrent TIAs.  Recurrent diverticulitis and perforation and exploratory laparotomy and sigmoid resection in June 2011.  GERD.  Hypertension .  Social history-patient apparently lived in an assisted living facility--he has a supportive son who lives in [name].  Has a very distant history of tobacco use almost 50 years ago no history of alcohol abuse .  Family history not pertinent.   Medication  Flomax 0.4 mg daily.  Prilosec 20 mg daily.  Requip 1 mg by mouth twice a day.  Zoloft 50 mg daily.  Vicodin 5-325 mg every 6 hours when necessary pain Lasix 20 mg a day.  Potassium 10 mEq a day.  Duo-nebs every 6 hours when necessary.   Review of systems  In general denies any fever chills has no complaints this evening  Skin does not complaining of any itching or rashes does have a history of basal cell carcinoma.  Head ears eyes nose mouth and throat-does not complaining of any sore throat visual changes nasal discharge  .  Respiratory no complaints of shortness of breath --is complaining of an intermittent cough relatively nonproductive Cardiac- does have a history of coronary artery disease but does not complain of any chest pain does not really have significant lower extremity edema  GI-does not complaining of abdominal pain nausea  vomiting diarrhea or constipation does have a history of an inguinal hernia.  GU-does not complaining of dysuria. Has a Foley catheter in place  Muscle skeletal-says his hip pain joint pain is controlled says he really hasn't had any problems with pain with it. --Occasionally complains of neck pain when he rotates his neck he does have  a history of DJD of cervical area Neurologic-does not complaining of any headache dizziness numbness-does have a history of restless leg.  Psych history of depression  \.  Physical exam.  Temperature 97.2 pulse 80 respirations 20 blood pressure listed 87/55-151/84 in this range-I took it manually and got 110/60  --O2 saturations continued to be in the 90s on oxygen  In general this is a frail but quite alert and cognitively intact elderly male.  The skin is warm and dryn  Eyes pupils appear equal round react to light sclera and conjunctiva are clear he has prescription lenses visual acuity appears grossly intact.  Oropharynx is clear mucous membranes moist.  Chest he has decreased breath sounds at the bases but no rhonchi rales or wheezes no labored breathing-however he is coughing with any attempt at deep inspiration.  Heart is regular rate and rhythm without murmur gallop or rub he is minimal lower extremity edema bilaterally somewhat reduced pedal pulses .  Abdomen is protuberant soft nontender he does have a well-healed surgical scar also note a right sided hernia. GU-does have a Foley catheter in place draining amber colored urine  Muscle- skeletal he has a leg immobilizer on the left leg-s-otherwise moves all extremities x3 grip strength is good and appears to be intact  Neurologic-is grossly intact no focal deficits his speech is clear no lateralizing findings.  Psych he is alert and oriented x3 pleasant and appropriate  .  Labs 04/02/2013.  Sodium 142 potassium 4.3 BUN 20 creatinine 1.05.    03/19/2013.  TSH-2.403.  WBC 8.9 hemoglobin 13.1 platelets 240.  Sodium 140 potassium 4.9 BUN 22 creatinine 1.10.  Liver function tests within normal limits except albumin of 3.2 and alkaline phosphatase of 152.  03/11/2013.  BNP-457.1.  1- 17 2015.  WBC 9.1 hemoglobin 11.0 platelets 195.  Sodium 141 potassium 3.9 BUN 14 creatinine 0.82.  calcium 8.0.   Assessment and plan.  #1-left  hip fracture with repair-appears to be doing well with this--ambulates largely in a wheelchair-this has not been an issue for some time-any pain is controlled with the Vicodin- .  -#2- Cough-we'll check a chest x-ray-also monitor vital signs pulse ox every shift for 48 hours-Will start Mucinex 600 mg twice a day for 5 days as well  A.  #3-history of restless legs this appears to be stable on Requip  #4-history of BPH-continues on Flomax--and has a Foley catheter inserted    #5-history of GERD-with hiatal hernia-he is on Prilosec this appears to be relatively controlled.  #6-history depression he is on Zoloft this appears to be quite stable  #7-weight loss-this appears to have stabilized #8-history of pleural effsuion with hypoxia this appears stable he is on low-dose Lasix with potassium Will update metabolic panel  Of note would like an update a CBC a metabolic panel for updated values 9-history coronary artery disease-patient currently is stable-with no recent issues--he had been on aspirin apparently for DVT prophylaxis after hip surgery-do not believe he is on any currently will start low-dose aspirin 81 mg enteric-coated daily--secondary to patient's advanced age and elevated liver function tests recently will not start a statin-and we'll update  a metabolic panel liver functiontests--  [id number]

## 2013-08-04 ENCOUNTER — Encounter: Payer: Self-pay | Admitting: Internal Medicine

## 2013-08-04 DIAGNOSIS — R059 Cough, unspecified: Secondary | ICD-10-CM | POA: Insufficient documentation

## 2013-08-04 DIAGNOSIS — R05 Cough: Secondary | ICD-10-CM | POA: Insufficient documentation

## 2013-08-05 DIAGNOSIS — I10 Essential (primary) hypertension: Secondary | ICD-10-CM | POA: Diagnosis not present

## 2013-08-06 DIAGNOSIS — R279 Unspecified lack of coordination: Secondary | ICD-10-CM | POA: Diagnosis not present

## 2013-08-06 DIAGNOSIS — Z9181 History of falling: Secondary | ICD-10-CM | POA: Diagnosis not present

## 2013-08-06 DIAGNOSIS — N390 Urinary tract infection, site not specified: Secondary | ICD-10-CM | POA: Diagnosis not present

## 2013-08-06 DIAGNOSIS — M6281 Muscle weakness (generalized): Secondary | ICD-10-CM | POA: Diagnosis not present

## 2013-08-06 DIAGNOSIS — M159 Polyosteoarthritis, unspecified: Secondary | ICD-10-CM | POA: Diagnosis not present

## 2013-08-06 DIAGNOSIS — I509 Heart failure, unspecified: Secondary | ICD-10-CM | POA: Diagnosis not present

## 2013-08-07 DIAGNOSIS — I509 Heart failure, unspecified: Secondary | ICD-10-CM | POA: Diagnosis not present

## 2013-08-07 DIAGNOSIS — M159 Polyosteoarthritis, unspecified: Secondary | ICD-10-CM | POA: Diagnosis not present

## 2013-08-07 DIAGNOSIS — D649 Anemia, unspecified: Secondary | ICD-10-CM | POA: Diagnosis not present

## 2013-08-07 DIAGNOSIS — M6281 Muscle weakness (generalized): Secondary | ICD-10-CM | POA: Diagnosis not present

## 2013-08-07 DIAGNOSIS — Z9181 History of falling: Secondary | ICD-10-CM | POA: Diagnosis not present

## 2013-08-07 DIAGNOSIS — R279 Unspecified lack of coordination: Secondary | ICD-10-CM | POA: Diagnosis not present

## 2013-08-08 ENCOUNTER — Other Ambulatory Visit: Payer: Self-pay | Admitting: *Deleted

## 2013-08-08 DIAGNOSIS — M159 Polyosteoarthritis, unspecified: Secondary | ICD-10-CM | POA: Diagnosis not present

## 2013-08-08 DIAGNOSIS — Z9181 History of falling: Secondary | ICD-10-CM | POA: Diagnosis not present

## 2013-08-08 DIAGNOSIS — I509 Heart failure, unspecified: Secondary | ICD-10-CM | POA: Diagnosis not present

## 2013-08-08 DIAGNOSIS — R279 Unspecified lack of coordination: Secondary | ICD-10-CM | POA: Diagnosis not present

## 2013-08-08 DIAGNOSIS — M6281 Muscle weakness (generalized): Secondary | ICD-10-CM | POA: Diagnosis not present

## 2013-08-08 MED ORDER — HYDROCODONE-ACETAMINOPHEN 5-325 MG PO TABS: ORAL_TABLET | ORAL | Status: DC

## 2013-08-08 NOTE — Telephone Encounter (Signed)
[facility] 

## 2013-08-08 NOTE — Telephone Encounter (Signed)
[facility] 

## 2013-08-09 ENCOUNTER — Other Ambulatory Visit: Payer: Self-pay | Admitting: *Deleted

## 2013-08-09 DIAGNOSIS — I509 Heart failure, unspecified: Secondary | ICD-10-CM | POA: Diagnosis not present

## 2013-08-09 DIAGNOSIS — Z9181 History of falling: Secondary | ICD-10-CM | POA: Diagnosis not present

## 2013-08-09 DIAGNOSIS — M6281 Muscle weakness (generalized): Secondary | ICD-10-CM | POA: Diagnosis not present

## 2013-08-09 DIAGNOSIS — M159 Polyosteoarthritis, unspecified: Secondary | ICD-10-CM | POA: Diagnosis not present

## 2013-08-09 DIAGNOSIS — R279 Unspecified lack of coordination: Secondary | ICD-10-CM | POA: Diagnosis not present

## 2013-08-09 MED ORDER — HYDROCODONE-ACETAMINOPHEN 5-325 MG PO TABS: ORAL_TABLET | ORAL | Status: DC

## 2013-08-09 NOTE — Telephone Encounter (Signed)
[facility] 

## 2013-08-15 DIAGNOSIS — R279 Unspecified lack of coordination: Secondary | ICD-10-CM | POA: Diagnosis not present

## 2013-08-15 DIAGNOSIS — Z9181 History of falling: Secondary | ICD-10-CM | POA: Diagnosis not present

## 2013-08-15 DIAGNOSIS — M6281 Muscle weakness (generalized): Secondary | ICD-10-CM | POA: Diagnosis not present

## 2013-08-15 DIAGNOSIS — I509 Heart failure, unspecified: Secondary | ICD-10-CM | POA: Diagnosis not present

## 2013-08-15 DIAGNOSIS — M159 Polyosteoarthritis, unspecified: Secondary | ICD-10-CM | POA: Diagnosis not present

## 2013-08-20 ENCOUNTER — Ambulatory Visit (HOSPITAL_COMMUNITY)
Admission: RE | Admit: 2013-08-20 | Discharge: 2013-08-20 | Disposition: A | Discharge status: Home / Self Care | Payer: Medicare Other | Source: Ambulatory Visit | Attending: Internal Medicine | Admitting: Internal Medicine

## 2013-08-20 ENCOUNTER — Non-Acute Institutional Stay (SKILLED_NURSING_FACILITY): Payer: Medicare Other | Admitting: Internal Medicine

## 2013-08-20 DIAGNOSIS — J4489 Other specified chronic obstructive pulmonary disease: Secondary | ICD-10-CM | POA: Diagnosis not present

## 2013-08-20 DIAGNOSIS — E039 Hypothyroidism, unspecified: Secondary | ICD-10-CM | POA: Diagnosis not present

## 2013-08-20 DIAGNOSIS — I10 Essential (primary) hypertension: Secondary | ICD-10-CM | POA: Diagnosis not present

## 2013-08-20 DIAGNOSIS — R05 Cough: Secondary | ICD-10-CM | POA: Diagnosis present

## 2013-08-20 DIAGNOSIS — R059 Cough, unspecified: Secondary | ICD-10-CM | POA: Diagnosis present

## 2013-08-20 DIAGNOSIS — R000 Tachycardia, unspecified: Secondary | ICD-10-CM

## 2013-08-20 DIAGNOSIS — K219 Gastro-esophageal reflux disease without esophagitis: Secondary | ICD-10-CM | POA: Diagnosis not present

## 2013-08-20 DIAGNOSIS — D649 Anemia, unspecified: Secondary | ICD-10-CM | POA: Diagnosis not present

## 2013-08-20 DIAGNOSIS — J449 Chronic obstructive pulmonary disease, unspecified: Secondary | ICD-10-CM | POA: Diagnosis not present

## 2013-08-20 DIAGNOSIS — N390 Urinary tract infection, site not specified: Secondary | ICD-10-CM | POA: Diagnosis not present

## 2013-08-20 NOTE — Progress Notes (Signed)
Patient ID: [name], male   DOB: 12/18/1913, 80 y.o.   MRN: [id number]   This is an acute visit.  Level of care skilled.  Facility [facility].  Chief complaint-acute visit secondary to tachycardia-GERD like symptoms.  History of present as.  Patient is a very pleasant 80 year old male who's been remarkably stable for a considerable period of time-he initially came here for rehabilitation after a left hip fracture this was initially complicated with some history of pulmonary edema which has responded well to low-dose Lasix-also urinary retention issues which appear to be stable.  He does have a history of hiatal hernia as well as previous esophageal dilation-says over the past few days when he drinks water he feels some discomfort extending into   the upper abdomen area t-he did not report this with solid food.  He is on Prilosec 20 mg a day He does have a history of a large incisional hernia as well as right direct inguinal hernia which have been stable.  He does not really complain of abdominal pain nausea or vomiting.  When I evaluated him today I noted his heart rate was 120-we did obtain an EKG which showed tachycardia with possible wide QRS-he is not on any blood pressure medicine or her rate limiting agent.  His blood pressure today is 124/70 in this appears to be stable.  He does not report any chespain or shortness of breath or palpitations is sitting comfortably in his wheelchair   he does have a history of ischemic heart disease  he is on aspirin   Family medical social history she been reviewed per admission note underwent 19 2015.  Medications have been reviewed per MAR.  Review of systems.  General no complaints of fever or chills.  Skin does not complaining of any itching or rashes.  Eyes no visual changes.  Oropharynx-as noted above does complain somewhat of discomfort with swallowing water-says occasionally he will have a sore throat.  Cardiac no chest  pain mild lower strandy edema.  Respiratory does not complain of any shortness of breath or cough.  GI as noted above is complaining somewhat of GERD-like symptoms with liquids-is not complaining specifically of abdominal pain nausea or vomiting.  Muscle skeletal at times will complain of neck pain this is relieved with current medications.  Neurologic does not complaining of any dizziness headache or syncopal-type feelings.  Physical exam.  He is afebrile pulse 120 respirations 18 blood pressure 124/70 taken manually x2.  In general this is a frail elderly male in no distress sitting comfortably in his wheelchair.  His skin is warm and dry he does have numerous sun-induced changes which are chronic.  Eyes pupils appear reactive to light visual acuity appears grossly intact.  Oropharynx is clear mucous membranes moist.  Chest is clear to auscultation with somewhat reduced air entry no labored breathing.  Heart is tachycardic with a rate of 120 it is regular he has minimal lower extremity edema.  Abdomen is soft nontender there is an incisional hernia present which is baseline on the right side there are positive bowel sounds.  Muscle skeletal moves all extremities x4 ambulates largely in a wheelchair.  Psych he is grossly alert and oriented at baseline pleasant and appropriate.  Labs.   08/07/2013.  WBC 9.1 hemoglobin 12.3 platelets 194.  08/05/2013.  Sodium 138 potassium 4.2 BUN 19 creatinine 1.05.  Liver function tests within normal limits except albumin of 3.1.  Assessment and plan.  #1 tachycardia-this appears to  be a new diagnoses-clinically he appears stable EKG done in facility did show tachycardia possibly widened QRS-this was discussed with Dr. Dellia Nims via phone-Will give him low-dose Lopressor 12.5 mg now-monitor his vital signs a pulse ox closely every 4 hours for now-also update lab work including a TSH CBC and basic metabolic panel  Dr. Dellia Nims will follow  up tomorrow have also ordered an EKG tomorrow  morning for his review  #2-GERD-like symptoms-at this point will DC the Prilosec and start Protonix 40 mg a day and monitor this closely with his advanced age suspect he would be a poor candidate for aggressive treatment however clinically this worsens certainly consider a GI consult  Addendum  We have obtained some be updated lab work was significant for a white count of 12.4 with absolute granulocytes elevated at slightly over 9-I did reevaluate patient his pulse rate actually has come down into the high 60s regular with some irregular beats-chest sounds are clear although somewhat more reduced on the right lower lower-no shortness of breath no dizziness-Will obtain a chest x-ray and urinalysis and culture.  Continue to monitor vital signs closely he appears to be quite stable and comfortable--systolic blood pressure 98 will have to monitor this but he is asymptomatic--again there will be followed by Dr. Dellia Nims tomorrow  [phone] note greater than 40 minutes spent assessing patient-reassessing patient-and formulating and coordinating and plan of care including extensive review of chart-of note greater than 50% of time spent coordinating plan of care involving new diagnosis of tachycardia

## 2013-08-21 ENCOUNTER — Non-Acute Institutional Stay (SKILLED_NURSING_FACILITY): Payer: Medicare Other | Admitting: Internal Medicine

## 2013-08-21 DIAGNOSIS — R0602 Shortness of breath: Secondary | ICD-10-CM | POA: Diagnosis not present

## 2013-08-21 DIAGNOSIS — R000 Tachycardia, unspecified: Secondary | ICD-10-CM

## 2013-08-21 DIAGNOSIS — I509 Heart failure, unspecified: Secondary | ICD-10-CM

## 2013-08-22 ENCOUNTER — Non-Acute Institutional Stay (SKILLED_NURSING_FACILITY): Payer: Medicare Other | Admitting: Internal Medicine

## 2013-08-22 ENCOUNTER — Ambulatory Visit (HOSPITAL_COMMUNITY): Payer: Medicare Other | Attending: Internal Medicine

## 2013-08-22 ENCOUNTER — Encounter: Payer: Self-pay | Admitting: Internal Medicine

## 2013-08-22 DIAGNOSIS — N390 Urinary tract infection, site not specified: Secondary | ICD-10-CM | POA: Insufficient documentation

## 2013-08-22 DIAGNOSIS — J189 Pneumonia, unspecified organism: Secondary | ICD-10-CM

## 2013-08-22 DIAGNOSIS — R000 Tachycardia, unspecified: Secondary | ICD-10-CM

## 2013-08-22 DIAGNOSIS — K5900 Constipation, unspecified: Secondary | ICD-10-CM | POA: Insufficient documentation

## 2013-08-22 DIAGNOSIS — R079 Chest pain, unspecified: Secondary | ICD-10-CM | POA: Insufficient documentation

## 2013-08-22 DIAGNOSIS — I509 Heart failure, unspecified: Secondary | ICD-10-CM | POA: Diagnosis not present

## 2013-08-22 DIAGNOSIS — N10 Acute tubulo-interstitial nephritis: Secondary | ICD-10-CM | POA: Diagnosis not present

## 2013-08-22 DIAGNOSIS — J690 Pneumonitis due to inhalation of food and vomit: Secondary | ICD-10-CM | POA: Insufficient documentation

## 2013-08-22 NOTE — Progress Notes (Signed)
Patient ID: [name], male   DOB: [birth year], 80 y.o.   MRN: [id number]   this is an acute visit.  Level of care skilled.  Facility [facility].  Chief complaint-acute visit followup tachycardia pneumonia CHF.  History of present illness.  Patient is a very pleasant 80 year old male he has been quite stable for a considerable period of time.  He had complained of some GERD-like symptoms earlier this week when I saw him and noted his pulse to be elevated at over 120-he was started on low-dose Lopressor 12.5 mg twice a day an EKG was ordered which showed  suspected wide QRS tachycardia  Also chest x-ray was ordered secondary to some decreased breath sounds a right lower lobe-the did show possible pneumonia in the right lower lobe-he has been started on Avelox.  Blood work was obtained initially showed an elevated white count of 12.1 he was started on Avelox and neck she CBC done today shows white count is down to 9.6 with was largely normal differential.  His pulse rate is in the 60s today he continues to be comfortable.  We also ordered a urine culture secondary to the initial elevated white count and this has come back growing Proteus greater than 100,000 colonies sensitivities have not arrived yet.  Dr. Dellia Nims also ordered a troponin level which was within normal limits as well as a BNP which was elevated at 1381-he was on Lasix 20 mg a day Dr. Dellia Nims did increase this to twice a day through tomorrow.  Clinically he says his cough is better today he appears to be stable his only complaint is some constipation saying he has not had a bowel movement in a couple days.  Family medical social history has been reviewed her previous progress notes most recently 08/20/2013.  Medications have been reviewed per MAR.  Review of systems.  General does not complaining of any fever or chills.  Respiratory no complaints of shortness of breath today  says his cough is better  Cardiac-does not  complaining of chest pain edema appears to be fairly minimal.  GI he is complaining of constipation no abdominal pain nausea or vomiting has a history of GERD symptoms again he was switched to Protonix from Prilosec earlier this week.  Neurologic does not complaining of any dizziness or headache.  Muscle skeletal as weakness lower extremities especially but does not complaining of pain today.  Psych he appears to be at his baseline largely pleasant and appropriate. Does not complaining of depression.  Physical exam.  Temperature is 97.8 pulse 60 respirations 16 blood pressure appears to run [id number]-[id number] systolically 16R diastolically.  In general this is a pleasant elderly male in no distress sitting comfortably in his wheelchair.  His skin is warm and dry.  Chest is clear to auscultation cannot really appreciate any labored breathing breath sounds in the right lobe actually sound somewhat improved.  Heart is regular rate and rhythm without murmur gallop rub he has minimal lower extremity edema.  Abdomen continues to be protuberant with a history of a hernia which is baseline on the right side this is reducible bowel sounds are active abdomen is nontender.  GU does have a Foley catheter draining amber colored urine cannot really appreciate much suprapubic tenderness.  Muscle skeletal muscle extremities at baseline ambulates in a wheelchair.  Neurologic is grossly intact no lateralizing findings.  Psych he is oriented alert pleasant and appropriate.  Labs.  08/22/2013.  WBC 9.6 hemoglobin 11.9 platelets 283.  08/21/2013.  Sodium 134 potassium 4.5 BUN 20 creatinine 1.16.  TSH-1.599.  08/20/2013.  WBC was 12.3.  08/21/2013-troponin level was less than 0. 3.  BNP was 1381.  Chest x-ray on July 7 which showed possible developing pneumonia right lower lobe.  Urine culture drawn on July 7 has grown out Proteus sensitivities are pending.  Assessment and plan.  #1  tachycardia-this appears improved on Lopressor Will continue to monitor vital signs and pulse as well as blood pressure somewhat low systolics at times but does not appear to be symptomatic this will have to be monitored.  #2 history of pneumonia?-Followup x-rays been ordered I do not see those results yet clinically he appears to be stable on Avelox we may have to switch antibiotic pending on what the urine culture grows out.  #3 UTI-again he has been growing out Proteus white count actually is down-Will await sensitivities before changing antibiotics since clinically he appears stable.  #4-constipation-he does have milk of magnesia-will add Dulcolax suppository-abdominal exam is quite benign this will have to be monitored when patient is in bed check for impaction.  #5-CHF-BNP is elevated he was on low-dose Lasix has been increased for about 3 days will check a metabolic panel next lab day to assure stability of his electrolytes  Addendum wehave obtained results of the chest x-ray done this morning it did show a persistent opacity of the right lung base suspicious for pneumonia-again clinically he appears to be stable will continue antibiotic and await sensitivities for the urine.  [id number]

## 2013-08-23 ENCOUNTER — Encounter (HOSPITAL_COMMUNITY): Payer: Self-pay | Admitting: Emergency Medicine

## 2013-08-23 ENCOUNTER — Emergency Department (HOSPITAL_COMMUNITY)
Admission: EM | Admit: 2013-08-23 | Discharge: 2013-08-24 | Disposition: A | Discharge status: Home / Self Care | Payer: Medicare Other | Attending: Emergency Medicine | Admitting: Emergency Medicine

## 2013-08-23 ENCOUNTER — Non-Acute Institutional Stay (SKILLED_NURSING_FACILITY): Payer: Medicare Other | Admitting: Internal Medicine

## 2013-08-23 ENCOUNTER — Emergency Department (HOSPITAL_COMMUNITY): Payer: Medicare Other

## 2013-08-23 DIAGNOSIS — K219 Gastro-esophageal reflux disease without esophagitis: Secondary | ICD-10-CM | POA: Insufficient documentation

## 2013-08-23 DIAGNOSIS — Z7982 Long term (current) use of aspirin: Secondary | ICD-10-CM | POA: Insufficient documentation

## 2013-08-23 DIAGNOSIS — R5383 Other fatigue: Secondary | ICD-10-CM

## 2013-08-23 DIAGNOSIS — H532 Diplopia: Secondary | ICD-10-CM | POA: Diagnosis not present

## 2013-08-23 DIAGNOSIS — H5340 Unspecified visual field defects: Secondary | ICD-10-CM | POA: Diagnosis not present

## 2013-08-23 DIAGNOSIS — R000 Tachycardia, unspecified: Secondary | ICD-10-CM | POA: Diagnosis not present

## 2013-08-23 DIAGNOSIS — Z79899 Other long term (current) drug therapy: Secondary | ICD-10-CM | POA: Insufficient documentation

## 2013-08-23 DIAGNOSIS — R5381 Other malaise: Secondary | ICD-10-CM

## 2013-08-23 DIAGNOSIS — H547 Unspecified visual loss: Secondary | ICD-10-CM | POA: Diagnosis not present

## 2013-08-23 DIAGNOSIS — Z862 Personal history of diseases of the blood and blood-forming organs and certain disorders involving the immune mechanism: Secondary | ICD-10-CM | POA: Insufficient documentation

## 2013-08-23 DIAGNOSIS — F411 Generalized anxiety disorder: Secondary | ICD-10-CM | POA: Diagnosis not present

## 2013-08-23 DIAGNOSIS — Z8673 Personal history of transient ischemic attack (TIA), and cerebral infarction without residual deficits: Secondary | ICD-10-CM | POA: Insufficient documentation

## 2013-08-23 DIAGNOSIS — I509 Heart failure, unspecified: Secondary | ICD-10-CM | POA: Insufficient documentation

## 2013-08-23 DIAGNOSIS — R6889 Other general symptoms and signs: Secondary | ICD-10-CM | POA: Insufficient documentation

## 2013-08-23 DIAGNOSIS — H538 Other visual disturbances: Secondary | ICD-10-CM | POA: Diagnosis not present

## 2013-08-23 DIAGNOSIS — Z87442 Personal history of urinary calculi: Secondary | ICD-10-CM | POA: Diagnosis not present

## 2013-08-23 DIAGNOSIS — Z85828 Personal history of other malignant neoplasm of skin: Secondary | ICD-10-CM | POA: Insufficient documentation

## 2013-08-23 DIAGNOSIS — N390 Urinary tract infection, site not specified: Secondary | ICD-10-CM | POA: Diagnosis not present

## 2013-08-23 DIAGNOSIS — M5030 Other cervical disc degeneration, unspecified cervical region: Secondary | ICD-10-CM | POA: Diagnosis not present

## 2013-08-23 DIAGNOSIS — Z8709 Personal history of other diseases of the respiratory system: Secondary | ICD-10-CM | POA: Insufficient documentation

## 2013-08-23 DIAGNOSIS — G2581 Restless legs syndrome: Secondary | ICD-10-CM | POA: Diagnosis not present

## 2013-08-23 DIAGNOSIS — J189 Pneumonia, unspecified organism: Secondary | ICD-10-CM | POA: Diagnosis not present

## 2013-08-23 DIAGNOSIS — N10 Acute tubulo-interstitial nephritis: Secondary | ICD-10-CM

## 2013-08-23 DIAGNOSIS — J181 Lobar pneumonia, unspecified organism: Secondary | ICD-10-CM

## 2013-08-23 DIAGNOSIS — Z8639 Personal history of other endocrine, nutritional and metabolic disease: Secondary | ICD-10-CM | POA: Insufficient documentation

## 2013-08-23 DIAGNOSIS — I10 Essential (primary) hypertension: Secondary | ICD-10-CM | POA: Insufficient documentation

## 2013-08-23 DIAGNOSIS — H539 Unspecified visual disturbance: Secondary | ICD-10-CM

## 2013-08-23 LAB — CBC
HCT: 36.1 % — ABNORMAL LOW (ref 39.0–52.0)
HEMOGLOBIN: 11.9 g/dL — AB (ref 13.0–17.0)
MCH: 29.4 pg (ref 26.0–34.0)
MCHC: 33 g/dL (ref 30.0–36.0)
MCV: 89.1 fL (ref 78.0–100.0)
Platelets: 275 10*3/uL (ref 150–400)
RBC: 4.05 MIL/uL — ABNORMAL LOW (ref 4.22–5.81)
RDW: 15.2 % (ref 11.5–15.5)
WBC: 10 10*3/uL (ref 4.0–10.5)

## 2013-08-23 LAB — URINALYSIS, ROUTINE W REFLEX MICROSCOPIC
Bilirubin Urine: NEGATIVE
Glucose, UA: NEGATIVE mg/dL
KETONES UR: NEGATIVE mg/dL
NITRITE: NEGATIVE
Specific Gravity, Urine: 1.015 (ref 1.005–1.030)
UROBILINOGEN UA: 0.2 mg/dL (ref 0.0–1.0)
pH: 8 (ref 5.0–8.0)

## 2013-08-23 LAB — COMPREHENSIVE METABOLIC PANEL
ALBUMIN: 2.9 g/dL — AB (ref 3.5–5.2)
ALK PHOS: 111 U/L (ref 39–117)
ALT: 9 U/L (ref 0–53)
AST: 15 U/L (ref 0–37)
Anion gap: 11 (ref 5–15)
BILIRUBIN TOTAL: 0.2 mg/dL — AB (ref 0.3–1.2)
BUN: 22 mg/dL (ref 6–23)
CHLORIDE: 98 meq/L (ref 96–112)
CO2: 28 mEq/L (ref 19–32)
Calcium: 8.3 mg/dL — ABNORMAL LOW (ref 8.4–10.5)
Creatinine, Ser: 1.24 mg/dL (ref 0.50–1.35)
GFR calc Af Amer: 54 mL/min — ABNORMAL LOW (ref >90)
GFR calc non Af Amer: 47 mL/min — ABNORMAL LOW (ref >90)
Glucose, Bld: 106 mg/dL — ABNORMAL HIGH (ref 70–99)
POTASSIUM: 4.7 meq/L (ref 3.7–5.3)
Sodium: 137 mEq/L (ref 137–147)
Total Protein: 7 g/dL (ref 6.0–8.3)

## 2013-08-23 LAB — DIFFERENTIAL
BASOS ABS: 0 10*3/uL (ref 0.0–0.1)
Basophils Relative: 0 % (ref 0–1)
Eosinophils Absolute: 0.1 10*3/uL (ref 0.0–0.7)
Eosinophils Relative: 1 % (ref 0–5)
Lymphocytes Relative: 17 % (ref 12–46)
Lymphs Abs: 1.7 10*3/uL (ref 0.7–4.0)
Monocytes Absolute: 1.2 10*3/uL — ABNORMAL HIGH (ref 0.1–1.0)
Monocytes Relative: 12 % (ref 3–12)
NEUTROS ABS: 7 10*3/uL (ref 1.7–7.7)
NEUTROS PCT: 70 % (ref 43–77)

## 2013-08-23 LAB — TROPONIN I: Troponin I: <0.3 ng/mL (ref <0.30)

## 2013-08-23 LAB — URINE MICROSCOPIC-ADD ON

## 2013-08-23 LAB — RAPID URINE DRUG SCREEN, HOSP PERFORMED
AMPHETAMINES: NOT DETECTED
Barbiturates: NOT DETECTED
Benzodiazepines: NOT DETECTED
Cocaine: NOT DETECTED
Opiates: POSITIVE — AB
TETRAHYDROCANNABINOL: NOT DETECTED

## 2013-08-23 LAB — ETHANOL

## 2013-08-23 LAB — PROTIME-INR
INR: 1.18 (ref 0.00–1.49)
Prothrombin Time: 15 seconds (ref 11.6–15.2)

## 2013-08-23 LAB — APTT: aPTT: 40 seconds — ABNORMAL HIGH (ref 24–37)

## 2013-08-23 MED ORDER — CIPROFLOXACIN HCL 500 MG PO TABS: 500.0000 mg | ORAL_TABLET | Freq: Two times a day (BID) | ORAL | Status: DC

## 2013-08-23 NOTE — ED Provider Notes (Signed)
CSN: [id number]     Arrival date & time 08/23/13  1956 History   First MD Initiated Contact with Patient 08/23/13 2014     Chief Complaint  Patient presents with  . Eye Problem     (Consider location/radiation/quality/duration/timing/severity/associated sxs/prior Treatment) HPI Patient reports he was eating dinner around 6 PM and noted he was seeing double when he was trying to eat. He states he could not figure out which hand to put the food in his mouth with. He denies any headache, numbness or tingling in his face or extremities, or noticing any double vision with his left hand. He felt it was mainly his right eye was the problem. He states for the past week he's been having some trouble drinking liquids and feels like he is getting choked. He feels like it gets stuck in his lower chest. He states however he's able to eat food without difficulty.  PCP Dr Woody Seller  Past Medical History  Diagnosis Date  . Anxiety   . HTN (hypertension)   . Reflux   . DJD (degenerative joint disease), cervical   . Osteoarthritis   . Skin cancer, basal cell   . Diverticulitis   . Hyperlipidemia   . Inguinal hernia   . Ischemic heart disease   . Hiatal hernia   . Kidney stone   . Esophageal stricture   . History of recurrent TIAs   . Restless leg syndrome   . GERD (gastroesophageal reflux disease)   . Pleural effusion   . CHF (congestive heart failure)    Past Surgical History  Procedure Laterality Date  . Intestines    . Exploratory laparotomy w/ bowel resection    . Esophageal dilation    . Fracture surgery    . Hemiarthroplasty hip     Family History  Problem Relation Age of Onset  . Arthritis     History  Substance Use Topics  . Smoking status: Never Smoker   . Smokeless tobacco: Not on file  . Alcohol Use: No  [facility], uses a wheelchair  Review of Systems  All other systems reviewed and are negative.     Allergies  Celebrex and Codeine  Home  Medications   Prior to Admission medications   Medication Sig Start Date End Date Taking? Authorizing Provider  aspirin EC 81 MG tablet Take 81 mg by mouth daily.    Historical Provider, MD  furosemide (LASIX) 20 MG tablet Take 20 mg by mouth daily.    Historical Provider, MD  HYDROcodone-acetaminophen (NORCO/VICODIN) 5-325 MG per tablet Take one tablet by mouth four times daily as needed for pain 08/09/13   Tiffany L Reed, DO  ipratropium-albuterol (DUONEB) 0.5-2.5 (3) MG/3ML SOLN Take 3 mLs by nebulization every 6 (six) hours as needed (shortness of breath).    Historical Provider, MD  magnesium hydroxide (MILK OF MAGNESIA) 400 MG/5ML suspension Take 30 mLs by mouth daily as needed for mild constipation.    Historical Provider, MD  metoprolol tartrate (LOPRESSOR) 25 MG tablet Take 12.5 mg by mouth 2 (two) times daily.    Historical Provider, MD  Nutritional Supplements (ENSURE CLEAR) LIQD Take 200 mLs by mouth 2 (two) times daily.    Historical Provider, MD  omeprazole (PRILOSEC) 40 MG capsule Take 40 mg by mouth daily.      Historical Provider, MD  potassium chloride (K-DUR,KLOR-CON) 10 MEQ tablet Take 10 mEq by mouth daily.    Historical Provider, MD  rOPINIRole (REQUIP) 1  MG tablet Take 1 mg by mouth 2 (two) times daily.    Historical Provider, MD  sertraline (ZOLOFT) 50 MG tablet Take 50 mg by mouth daily.      Historical Provider, MD  tamsulosin (FLOMAX) 0.4 MG CAPS capsule Take 0.4 mg by mouth daily.    Historical Provider, MD   BP 109/58  Pulse 65  Temp(Src) 97.8 F (36.6 C) (Oral)  Resp 18  SpO2 99%  Vital signs normal   Physical Exam  Nursing note and vitals reviewed. Constitutional: He is oriented to person, place, and time.  Non-toxic appearance. He does not appear ill. No distress.  Elderly frail male  HENT:  Head: Normocephalic and atraumatic.  Right Ear: External ear normal.  Left Ear: External ear normal.  Nose: Nose normal. No mucosal edema or rhinorrhea.   Mouth/Throat: Oropharynx is clear and moist and mucous membranes are normal. No dental abscesses or uvula swelling.  Eyes: Conjunctivae and EOM are normal. Pupils are equal, round, and reactive to light.  Neck: Normal range of motion and full passive range of motion without pain. Neck supple.  Cardiovascular: Normal rate, regular rhythm and normal heart sounds.  Exam reveals no gallop and no friction rub.   No murmur heard. Pulmonary/Chest: Effort normal and breath sounds normal. No respiratory distress. He has no wheezes. He has no rhonchi. He has no rales. He exhibits no tenderness and no crepitus.  Abdominal: Soft. Normal appearance and bowel sounds are normal. He exhibits no distension. There is no tenderness. There is no rebound and no guarding.  Musculoskeletal: Normal range of motion. He exhibits no edema and no tenderness.  Moves all extremities well.   Neurological: He is alert and oriented to person, place, and time. He has normal strength. No cranial nerve deficit.  Patient has no facial asymmetry. His grips are mildly weak on the left. He has mild pronator drift on the left. He has difficulty holding his left leg against gravity however he states that way since he had a hip fracture. On exam of his visual fields he is noted to have a marked loss of vision inferiorly in both eyes. He also has lost some lateral visual field in both eyes but it is worse or more extensive on the right than the left.  Skin: Skin is warm, dry and intact. No rash noted. No erythema. No pallor.  Psychiatric: He has a normal mood and affect. His speech is normal and behavior is normal. His mood appears not anxious.    ED Course  Procedures (including critical care time)  22:00 Pt left with Dr Roderic Palau to get the rest of his labs.    Labs Review Results for orders placed during the hospital encounter of 08/23/13  CBC      Result Value Ref Range   WBC 10.0  4.0 - 10.5 K/uL   RBC 4.05 (*) 4.22 - 5.81  MIL/uL   Hemoglobin 11.9 (*) 13.0 - 17.0 g/dL   HCT 36.1 (*) 39.0 - 52.0 %   MCV 89.1  78.0 - 100.0 fL   MCH 29.4  26.0 - 34.0 pg   MCHC 33.0  30.0 - 36.0 g/dL   RDW 15.2  11.5 - 15.5 %   Platelets 275  150 - 400 K/uL  DIFFERENTIAL      Result Value Ref Range   Neutrophils Relative % 70  43 - 77 %   Neutro Abs 7.0  1.7 - 7.7 K/uL   Lymphocytes Relative  17  12 - 46 %   Lymphs Abs 1.7  0.7 - 4.0 K/uL   Monocytes Relative 12  3 - 12 %   Monocytes Absolute 1.2 (*) 0.1 - 1.0 K/uL   Eosinophils Relative 1  0 - 5 %   Eosinophils Absolute 0.1  0.0 - 0.7 K/uL   Basophils Relative 0  0 - 1 %   Basophils Absolute 0.0  0.0 - 0.1 K/uL  URINE RAPID DRUG SCREEN (HOSP PERFORMED)      Result Value Ref Range   Opiates POSITIVE (*) NONE DETECTED   Cocaine NONE DETECTED  NONE DETECTED   Benzodiazepines NONE DETECTED  NONE DETECTED   Amphetamines NONE DETECTED  NONE DETECTED   Tetrahydrocannabinol NONE DETECTED  NONE DETECTED   Barbiturates NONE DETECTED  NONE DETECTED  URINALYSIS, ROUTINE W REFLEX MICROSCOPIC      Result Value Ref Range   Color, Urine YELLOW  YELLOW   APPearance CLOUDY (*) CLEAR   Specific Gravity, Urine 1.015  1.005 - 1.030   pH 8.0  5.0 - 8.0   Glucose, UA NEGATIVE  NEGATIVE mg/dL   Hgb urine dipstick TRACE (*) NEGATIVE   Bilirubin Urine NEGATIVE  NEGATIVE   Ketones, ur NEGATIVE  NEGATIVE mg/dL   Protein, ur TRACE (*) NEGATIVE mg/dL   Urobilinogen, UA 0.2  0.0 - 1.0 mg/dL   Nitrite NEGATIVE  NEGATIVE   Leukocytes, UA LARGE (*) NEGATIVE  URINE MICROSCOPIC-ADD ON      Result Value Ref Range   WBC, UA 21-50  <3 WBC/hpf   RBC / HPF 0-2  <3 RBC/hpf   Bacteria, UA MANY (*) RARE   Crystals TRIPLE PHOSPHATE CRYSTALS (*) NEGATIVE    Laboratory interpretation all normal except  Poss UTI, mild anemia   Imaging Review  Dg Chest 1 View  08/22/2013   CLINICAL DATA:  Right-sided pain for 2-3 weeks   IMPRESSION: Persistent parenchymal opacity at the right lung base most  consistent with pneumonia. Consider CT of the chest if further assessment is warranted. Hyper aeration.   Electronically Signed   By: Ivar Drape M.D.   On: 08/22/2013 13:03   Mr Brain Wo Contrast  08/23/2013   CLINICAL DATA:  Blurred vision. Stroke risk factors include hypertension, and hyperlipidemia.  EXAM: MRI HEAD WITHOUT CONTRAST  TECHNIQUE: Multiplanar, multiecho pulse sequences of the brain and surrounding structures were obtained without intravenous contrast.  COMPARISON:  None.  FINDINGS: No acute stroke, acute hemorrhage, mass lesion, hydrocephalus, or extra-axial fluid. Generalized atrophy. Remote left occipital PCA territory infarct. Remote left cerebellar infarcts. Flow voids are maintained in the carotid, basilar, and vertebral arteries.  Moderate pannus surrounds the odontoid, without cervicomedullary compression. There is acute right maxillary sinusitis. There is complete opacification of the right frontal sinus which could be acute or chronic. Moderate right ethmoid fluid is also observed.  Visualized orbits are unremarkable except for bilateral cataract extraction. No mastoid fluid. No osseous lesions.  IMPRESSION: No acute stroke or visible mass lesion.  Generalized atrophy and small vessel disease. 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  Right-sided sinus disease with evidence for acuity in the maxillary region.   Electronically Signed   By: Rolla Flatten M.D.   On: 08/23/2013 21:29       EKG Interpretation   Date/Time:  Friday August 23 2013 21:22:33 EDT Ventricular Rate:  65 PR Interval:  52 QRS Duration: 135 QT Interval:  480 QTC Calculation: 499 R Axis:   -  71 Text Interpretation:  Sinus rhythm Left axis deviation RBBB and LAFB  Artifact No significant change since last tracing 08 Mar 2013 Confirmed by  [facility]  MD-I, Panhia Karl ([id number]) on 08/23/2013 9:35:06 PM      MDM   Final diagnoses:  Double vision  Visual field loss     Disposition pending per Dr  Roderic Palau   Rolland Porter, MD, Alanson Aly, MD 08/23/13 2209

## 2013-08-23 NOTE — Progress Notes (Signed)
Patient ID: Patrick [name], male   DOB: [birth year], 80 y.o.   MRN: [id number]   This is an addendum to previous note.  Date is 08/23/2013.  This evening patient did complain of fairly sudden onset of double vision of his right eye more than left-also had problems with depth perception and said he had difficult time feeding himself getting food in mouth  because he could not really see to coordinate getting his food in his mouth--  I did reassess and he appeared to have double vision in both eyes.  His pupils did appear to be reactive extraocular movements intact however when asked to demonstrate he did have difficulty putting a straw in his mouth using his hand.  He said this ist new and he has not had this before-his vital signs appear to be stable.  His blood pressure was 104/56-pulse was 65 and regular-since this is an acute change we'll send him to the ER for evaluation.  Neurologically he appeared to be intact otherwise grip strength intact moving all extremities at baseline cranial nerves appear to be grossly intact his speech is clear slightly dysarthric but he can have that at times  Her continue to be regular rate and rhythm.  [id number] note greater than 40 minutes spent assessing patient and formulating a plan of care and then reassess the patient later in the day resulting in hospital evaluation

## 2013-08-23 NOTE — ED Notes (Signed)
Patient is a resident from [facility]; states having blurred vision that is greater in the right than the left.  Patient also states when he was eating, the utensil kept going to the right.

## 2013-08-23 NOTE — ED Notes (Signed)
MD at bedside. 

## 2013-08-23 NOTE — Discharge Instructions (Signed)
Follow up with an ophthalmologist next week

## 2013-08-23 NOTE — Progress Notes (Signed)
Patient ID: Patrick [name], male   DOB: [birth year], 80 y.o.   MRN: [id number]   This is an acute visit.  Level of care skilled.  Facility [facility]  Chief complaint  Acute visit followup tachycardia pneumonia-UTI-CHF-hypotension?.  History of present illness.  Patient is a very pleasant 80 year old male who's been quite stable-however this week has been somewhat challenging he was found on very much a routine visit to have had an elevated heart rate up over 120-he was started on low-dose Lopressor 12.5 mg twice a day EKG which showed sinus tachycardia.  Chest x-ray also was ordered because of some decreased breath sounds on the right and this showed possible pneumonia followup chest x-ray showed this was persisting.  He is on Avelox.  He also had an elevated white count initially of 12.1 however this normalized to 9.6 on lab done yesterday-does have a history of UTI urine has come back again positive for Proteus mirabilis-he has completed a seven-day course of amoxicillin but apparently is still growing out 100,000 colonies here.  Today has not been a particularly good day-he says he just feels weak and more forgetful-he does appear to be more tired than when I saw him yesterday.  Apparently initial blood pressure taken by machine was 85/49 nursing staff did reassess this shortly thereafter and got 100/50 manually-I rechecked it this afternoon and got 108/54.  In regards to CHF-BMP also was ordered which was over 1300 he is completing an increased dose of Lasix 20 mg twice a day today-with orders to reduce this back to his baseline of 20 mg a day tomorrow.  Family medical social history as been reviewed per previous progress notes most recently July 7 through 08/22/2013.  Medications have been reviewed per MAR.  Review of systems.  Gen. is not complaining of any specific pain fever or chills says he just feels weak and forgetful.  Head ears eyes nose mouth and throat-does not  complaining of any visual changes or sore throat.  Respiratory no complaints of shortness of breath or cough again he has been treated for pneumonia but apparently cough is resolving.  Heart is not complaining of any chest pain nor significantly increased edema.  GI no complaints of abdominal pain nausea or vomiting had complained of constipation yesterday but apparently has had a decent bowel movement in the interim  GU he does have an indwelling Foley catheter again he has a UTI.  Muscle skeletal-not complaining of any joint pain but does say he generally feels weak today.  Neurologic is not complaining of any dizziness headache or syncopal-type feelings.  Psych appears to be at his baseline pleasant and appropriate although --per nursing staff he appeared to be somewhat more confused earlier in the day although he recalled his nurse and was as usual pleasant and appropriate  Physical exam.  Temperature 96.9 pulse 66 respirations 20 doctors retaken manually 108/54 again had some lower readings earlier in the day.  In general this is a pleasant quite frail elderly male in no distress sitting comfortably in his wheelchair he appears to be more tired appearing yesterday.  Skin is warm and dry.  Eyes he has prescription lenses pupils appear reactive to light oropharynx is clear mucous membranes moist.  His chest is clear to auscultation with reduced breath sounds there is no labored breathing.  Heart is regular rate and rhythm with a rare irregular beat he has minimal lower extremity edema.  His abdomen is protuberant soft nontender with active bowel  sounds--he has a right-sided hernia which is baseline  GU has a Foley catheter in place draining somewhat dark amber colored urine.  Musculoskeletal moves all extremities at baseline other than arthritic do not note any deformities.  Neurologic is grossly intact there are no lateralizing findings his speech is clear.  Psych he appears  largely at his baseline and alert oriented and appropriate although apparently had some confusion and memory issues earlier in the day  Labs.  Urine culture results which arrived today show greater than 100,000 colonies of Proteus this is resistant to quinolones it appears.  08/22/2013.  WBC 9.6 hemoglobin 11.9 platelets 283.  Sodium 134 potassium 4.5 BUN 20 creatinine 1.16.  TSH-1.599.  On July 7 his white count was 12.3 BNP was 1381.  Assessment and plan.  #1 tachycardia this appears to be quite stable on the Lopressor which causes largely in the 60s however this appears to be complicated somewhat with some low blood pressures at times as noted above.--Will decrease Lopressor to 6.25 mg twice a day and monitor this was discussed with Dr. Dellia Nims via phone  #2-general malaise weakness-this could be multifactorial he is being treated for pneumonia also appears to have a repeat current UTI as well the treatment with a beta blocker-it appears the Proteus UTI is resistant to quinolones will switch this to Augmentin 500 mg twice a day for 14 days-also will add a probiotic for 2 weeks twice a day--this also was discussed with Dr. Dellia Nims via phone  #3 CHF-he is on Lasix twice a day today this will be reduced to daily as of tomorrow--we'll update metabolic panel next laboratory day  [phone]

## 2013-08-24 ENCOUNTER — Encounter: Payer: Self-pay | Admitting: Internal Medicine

## 2013-08-24 ENCOUNTER — Non-Acute Institutional Stay (SKILLED_NURSING_FACILITY): Payer: Medicare Other | Admitting: Internal Medicine

## 2013-08-24 DIAGNOSIS — J189 Pneumonia, unspecified organism: Secondary | ICD-10-CM

## 2013-08-24 DIAGNOSIS — R000 Tachycardia, unspecified: Secondary | ICD-10-CM

## 2013-08-24 DIAGNOSIS — H539 Unspecified visual disturbance: Secondary | ICD-10-CM | POA: Diagnosis not present

## 2013-08-24 DIAGNOSIS — N10 Acute tubulo-interstitial nephritis: Secondary | ICD-10-CM

## 2013-08-24 NOTE — Progress Notes (Signed)
Patient ID: Patrick [name], male   DOB: 07/26/1913, 80 y.o.   MRN: [id number]   This is an acute visit.  Local care skilled.  Facility [facility].  Chief complaint-acute visit followup ER visit for visual changes.  History of present illness.  Patient is a very pleasant 80 year old male who has had somewhat of a complicated week.  Initially was found to have significant tachycardia -- this was complicated with the appears to be a right lower lobe pneumonia as well as a Proteus UTI.  His tachycardia was treated with Lopressor although he appeared to have somewhat low blood pressures and this was decreased yesterday to 6.25 mg 3 times a day.  He was also being treated for right lower lobe pneumonia as well as a Proteus UTI initially with Avelox but when sensitivities came back for the and this was switched to Augmentin.  This was complicated further by an incident last night which patient complained of fairly sudden onset of double vision especially in his right eye and had difficulty feeding himself getting his sputum to coordinate with his mouth because of his visual changes.  This was concerning enough to send him to the ER where workup included blood work which was unremarkable but MRI was done as well which was unremarkable for any acute process or stroke.  He has returned to the facility and no longer complains of visual changes he is essentially at his baseline in his room visiting with family this afternoon.  Vital signs continued to be stable-urinalysis in the ER was suspicious for UTI with large leukocytes--21--50 white blood cells and many bacteria- He was started empirically on ciprofloxacin-however with sensitivities already in facility I will switch him back to the Augmentin which is sensitive to this Proteus.  His vital signs continued to be stable does not complaining of increased shortness of breath or dysuria today he does have a chronic indwelling Foley catheter.  Family  medical social history as been reviewed most recently note on 08/23/2013.  Medications have been reviewed per MAR.  Review of systems.  In general no complaints of fever or chills.  Skin is not complaining of any itching or rashes.  Eyes did not complaint any more of double vision since this is resolved.  Cardiac no chest pain or significant edema.  Respiratory does not complaining of any cough or shortness of breath today.  GI no complaints of nausea or vomiting diarrhea constipation.  GU is not complaining of dysuria he does have the indwelling Foley catheter.  Muscle skeletal does not complaining of joint pain.  Neurologic is not complaining of any dizziness or headache or visual changes again at this time.  Psych appears to be in good spirits which is his baseline to be feeling better than he did yesterday.  Physical exam.  He is afebrile pulse is 76 respirations 18 blood pressure 104/50 taken manually.  In general this is a pleasant elderly male in no distress sitting comfortably in his wheelchair.  His skin is warm and dry.  Chest is clear to auscultation somewhat reduced breath sounds at the bases but this does not appear to be in acute change.  Heart is regular rate and rhythm without murmur gallop or rub I got a rate of 76.  There is minimal lower extremity edema.  GU he does have Foley catheter draining amber colored urine.  Muscle skeletal has general frailty but moves all extremities x4 at baseline he does appear to have somewhat of a  protrusion of his biceps on the left upper arm but this does not appear to be a totally new finding.  Neurologic is grossly intact speech is clear he continues to be a very pleasant engaged individual.  Psych appears grossly alert and oriented visiting with his family this afternoon laughing smiling which is his baseline  Labs.  08/23/2013.  WBC 10.0 hemoglobin 11.9 platelets 275.  Sodium 137 potassium 4.7 BUN 22  creatinine 1.24.  Albumin 2.9-bilirubin 0.2-troponin less than 0.3   Assessment and plan.  #1-visual changes-this appears resolved and was transitory workup in the ER included an MRI which did not show an acute etiology-he is asymptomatic today appears to be back at his baseline I continue to monitor.  #2-history of tachycardia-this continues to be stable his Lopressor was decreased yesterday secondary to some low blood pressures continue to monitor rate appears controlled.  #3 history UTI this has been positive for Proteus will continue the Augmentin since this is sensitive to this strain Proteus-Will DC the Cipro-for write order to followup culture nonetheless from ER in case this grows something that we are not treating with the Augmentin.  #4-pneumonia-this appears to be stable clinically again he continues on antibiotic  [phone]

## 2013-08-26 ENCOUNTER — Non-Acute Institutional Stay (SKILLED_NURSING_FACILITY): Payer: Medicare Other | Admitting: Internal Medicine

## 2013-08-26 DIAGNOSIS — I509 Heart failure, unspecified: Secondary | ICD-10-CM | POA: Diagnosis not present

## 2013-08-26 DIAGNOSIS — J189 Pneumonia, unspecified organism: Secondary | ICD-10-CM | POA: Diagnosis not present

## 2013-08-26 DIAGNOSIS — I10 Essential (primary) hypertension: Secondary | ICD-10-CM | POA: Diagnosis not present

## 2013-08-26 DIAGNOSIS — Z9181 History of falling: Secondary | ICD-10-CM | POA: Diagnosis not present

## 2013-08-26 DIAGNOSIS — R000 Tachycardia, unspecified: Secondary | ICD-10-CM | POA: Diagnosis not present

## 2013-08-26 DIAGNOSIS — M6281 Muscle weakness (generalized): Secondary | ICD-10-CM | POA: Diagnosis not present

## 2013-08-26 DIAGNOSIS — M159 Polyosteoarthritis, unspecified: Secondary | ICD-10-CM | POA: Diagnosis not present

## 2013-08-26 DIAGNOSIS — R279 Unspecified lack of coordination: Secondary | ICD-10-CM | POA: Diagnosis not present

## 2013-08-27 DIAGNOSIS — Z9181 History of falling: Secondary | ICD-10-CM | POA: Diagnosis not present

## 2013-08-27 DIAGNOSIS — I509 Heart failure, unspecified: Secondary | ICD-10-CM | POA: Diagnosis not present

## 2013-08-27 DIAGNOSIS — R279 Unspecified lack of coordination: Secondary | ICD-10-CM | POA: Diagnosis not present

## 2013-08-27 DIAGNOSIS — M6281 Muscle weakness (generalized): Secondary | ICD-10-CM | POA: Diagnosis not present

## 2013-08-27 DIAGNOSIS — M159 Polyosteoarthritis, unspecified: Secondary | ICD-10-CM | POA: Diagnosis not present

## 2013-08-27 LAB — URINE CULTURE

## 2013-08-28 DIAGNOSIS — R279 Unspecified lack of coordination: Secondary | ICD-10-CM | POA: Diagnosis not present

## 2013-08-28 DIAGNOSIS — I509 Heart failure, unspecified: Secondary | ICD-10-CM | POA: Diagnosis not present

## 2013-08-28 DIAGNOSIS — Z9181 History of falling: Secondary | ICD-10-CM | POA: Diagnosis not present

## 2013-08-28 DIAGNOSIS — M159 Polyosteoarthritis, unspecified: Secondary | ICD-10-CM | POA: Diagnosis not present

## 2013-08-28 DIAGNOSIS — M6281 Muscle weakness (generalized): Secondary | ICD-10-CM | POA: Diagnosis not present

## 2013-08-28 NOTE — Progress Notes (Addendum)
Patient ID: Patrick [name], male   DOB: 06-Mar-1913, 80 y.o.   MRN: [id number]                PROGRESS NOTE  DATE:  08/21/2013    FACILITY: [facility]    LEVEL OF CARE:   SNF   Acute Visit   CHIEF COMPLAINT:  Tachycardia.      HISTORY OF PRESENT ILLNESS:  This is a patient who came here early this year after suffering a left hip fracture. He developed post operative CHF and has been on a small dose of lasix since. He shows a history of CAD but there is nothing specific to review.      Yesterday, apparently on a routine visit, he was found to be tachycardic with a regular tachycardia heart rate in the 130s.  The patient did not appear to be in any distress at all.  He had no specific complaints.  They did give him a small dose of metoprolol .  I am really uncertain what effect this had  (12.5 mg).  An EKG from this morning shows a tachycardia with a pulse rate of 120.  The only place I am definitively able to see P-waves on an EKG done this morning seems to be in lead 2.  He may have a first-degree heart block.  It almost appears that there is a double inflection of the PR interval .  I wonder if one of these is a U-wave.  In any case, the tachycardia is regular.  He has a right bundle branch  block and a left anterior fascicular block.  Neither one of these are new.  His blood pressure was 100/58 this morning with a pulse of 82.  He is now upwards of 710 systolic with a pulse of 626.    CURRENT MEDICATIONS:  Medication list is reviewed.     Aspirin 81 q.d.     Avelox 400 mg a day, started yesterday.    Albuterol nebulizers q.6 hours, which he apparently has not received (not a reason for his tachycardia).    Lasix 20 q.d.    Protonix 40 q.d.    Flomax 0.4 q.d.    Zoloft 50 q.d.    REVIEW OF SYSTEMS:   CHEST/RESPIRATORY:   The patient states he has "bronchitis" and I see he was put on Avelox yesterday.   CARDIAC:   He is not clearly describing chest pain or  palpitations.   MUSCULOSKELETAL:  Extremities:  No lower extremity pain.    PHYSICAL EXAMINATION:   GENERAL APPEARANCE:  Elderly man, not in any distress, lying in bed.    CHEST/RESPIRATORY:  Clear air entry bilaterally.    CARDIOVASCULAR:  CARDIAC:  Heart sounds are soft, tachycardic.  There is no S3, no S4.  His JVP is not elevated.  There are no murmurs.   NECK/THYROID:  No palpable thyroid.   GASTROINTESTINAL:  LIVER/SPLEEN/KIDNEYS:  No liver, no spleen.  No tenderness.   CIRCULATION:   EDEMA/VARICOSITIES:   Extremities:  No evidence of a DVT.    ASSESSMENT/PLAN:  Tachycardia.  This was apparently discovered incidentally.  The patient is complaining of a "bronchitis" and I note he was put on an antibiotic yesterday.  He will need a chest x-ray.  He will also need lab work.  I am not completely certain what I am seeing is sinus, although it is certainly regular.  There is no obvious reason for him to have a junctional  tachycardia.  He is not on Digoxin.  A troponin is in order.    History  of  systolic heart failure.  He is on Lasix 20 mg a day.    History of CAD: He is not symptomatic. Nothing to review about this on [place]  I am going to start him on a small dose of Lopressor routinely.  He will need a chest x-ray, a troponin.  His basic chemistry and CBC are reasonably normal other than an elevated white count of 12.3. His potassium is 4.7, BUN 20, creatinine 1.17.   He will need a troponin-I.    CPT CODE: 32919        ADDENDUM:   The patient already had a chest x-ray yesterday that showed a new airspace opacity at the right lung base, concerning for pneumonia.  This was the reason, I am assuming, that he was started on the Avelox.  I have tried to look at the x-ray in the [facility].  For some reason, it does not seem currently available.   This may be the reason for the tachycardia.

## 2013-08-28 NOTE — Progress Notes (Addendum)
Patient ID: Patrick [name], male   DOB: [birth year], 80 y.o.   MRN: [id number]               PROGRESS NOTE  DATE:  08/26/2013    FACILITY: [facility]    LEVEL OF CARE:   SNF   Acute Visit   CHIEF COMPLAINT:  Follow up medical issues.    HISTORY OF PRESENT ILLNESS:  This is a 80 year-old man who was discovered incidentally to be tachycardic.  This is a sinus tachycardia with right bundle branch block and left anterior fascicular block.  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  I put him on a low dose of Lopressor.  However, his blood pressure became soft.  This was reduced to 6.25 b.i.d.     He was also felt to have pneumonia somewhere in this secondary to a chest x-ray that showed a persistent opacity at the right lung base.    Finally, he was sent over to the ER on 08/23/2013 secondary to increasing confusion, double vision.  He had an MRI of the brain that showed no acute stroke or visible mass lesion.  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  A urine culture was essentially negative, 15,000 colonies of gram-negative rods.    REVIEW OF SYSTEMS:   GENERAL:  The patient states he is feeling much better.   HEENT:  He still complains about visual acuity loss, but there is no suggestion of double vision at the moment.   CHEST/RESPIRATORY:  No cough.   CARDIAC:   No chest pain.   GU:  He has a chronic Foley catheter.    PHYSICAL EXAMINATION:   VITAL SIGNS:   PULSE:  According to radial pulses, 72 and regular.  According to our pulse ox, 103.   GENERAL APPEARANCE:  The patient appears to be cognitively intact.   CHEST/RESPIRATORY:  Clear air entry bilaterally.   CARDIOVASCULAR:  CARDIAC:   Heart sounds are normal.  There are no murmurs.   GASTROINTESTINAL:  LIVER/SPLEEN/KIDNEYS:  No liver, no spleen.   GENITOURINARY:  BLADDER:   No suprapubic or costovertebral angle tenderness.     ASSESSMENT/PLAN:  Tachycardia of unclear etiology.   This seems to be adequately controlled on a small dose of Lopressor.  The exact cause of this was never really clear.  He may have had a pneumonia in the right lower lobe, although this was not totally clearly a pneumonia.    Complaints of double vision.   That seems to have resolved.  A MRI of the brain was negative.  I will see if we can arrange for him to return to see Ophthalmology.    Urine culture was negative.  I think he can come off the Augmentin for five days of antibiotics.

## 2013-08-29 ENCOUNTER — Telehealth (HOSPITAL_BASED_OUTPATIENT_CLINIC_OR_DEPARTMENT_OTHER): Payer: Self-pay | Admitting: Emergency Medicine

## 2013-08-29 DIAGNOSIS — I509 Heart failure, unspecified: Secondary | ICD-10-CM | POA: Diagnosis not present

## 2013-08-29 DIAGNOSIS — R279 Unspecified lack of coordination: Secondary | ICD-10-CM | POA: Diagnosis not present

## 2013-08-29 DIAGNOSIS — Z9181 History of falling: Secondary | ICD-10-CM | POA: Diagnosis not present

## 2013-08-29 DIAGNOSIS — M159 Polyosteoarthritis, unspecified: Secondary | ICD-10-CM | POA: Diagnosis not present

## 2013-08-29 DIAGNOSIS — M6281 Muscle weakness (generalized): Secondary | ICD-10-CM | POA: Diagnosis not present

## 2013-08-29 NOTE — Telephone Encounter (Signed)
Post ED Visit - Positive Culture Follow-up  Culture report reviewed by antimicrobial stewardship pharmacist: []  Wes [place], Pharm.D., BCPS []  Heide Guile, Pharm.D., BCPS []  Alycia Rossetti, Pharm.D., BCPS [x]  [place], Pharm.D., BCPS, AAHIVP []  Legrand Como, Pharm.D., BCPS, AAHIVP  Positive urine culture Treated with Amoxicillin, organism sensitive to the same and no further patient follow-up is required at this time.  Myrna Blazer 08/29/2013, 5:34 PM

## 2013-08-30 ENCOUNTER — Non-Acute Institutional Stay (SKILLED_NURSING_FACILITY): Payer: Medicare Other | Admitting: Internal Medicine

## 2013-08-30 DIAGNOSIS — M6281 Muscle weakness (generalized): Secondary | ICD-10-CM | POA: Diagnosis not present

## 2013-08-30 DIAGNOSIS — I509 Heart failure, unspecified: Secondary | ICD-10-CM | POA: Diagnosis not present

## 2013-08-30 DIAGNOSIS — M159 Polyosteoarthritis, unspecified: Secondary | ICD-10-CM | POA: Diagnosis not present

## 2013-08-30 DIAGNOSIS — R000 Tachycardia, unspecified: Secondary | ICD-10-CM | POA: Diagnosis not present

## 2013-08-30 DIAGNOSIS — I959 Hypotension, unspecified: Secondary | ICD-10-CM | POA: Diagnosis not present

## 2013-08-30 DIAGNOSIS — R279 Unspecified lack of coordination: Secondary | ICD-10-CM | POA: Diagnosis not present

## 2013-08-30 DIAGNOSIS — Z9181 History of falling: Secondary | ICD-10-CM | POA: Diagnosis not present

## 2013-08-30 NOTE — Progress Notes (Signed)
Patient ID: Patrick [name], male   DOB: [birth year], 80 y.o.   MRN: [id number]   This is an acute visit.  Level of care skilled.  Facility [facility].  Chief complaint-acute visit secondary tachycardia-hypotension.  History of present illness.  Patient is a pleasant 80 year old male who we have seen quite frequently recently- has been on antibiotic treatment for possible pneumonia UTI-this appears to be stable.  However he continues to have issues with hypotension with treatment for his tachycardia-he recently was noted to have a pulse of greater than 120-EKG did show sinus tachycardia and he was started on Lopressor this is gradually been titrated down secondary to hypotension concerns.  However despite being on Lopressor 6.25 mg twice a day he apparently continues to have some symptomatic hypotension with a systolic in the 41O-.  Yesterday apparently he complainedtof staff of feeling dizzy his blood pressure was taken and again systolic was in the low 87O  His Lopressor has been held as of the second dose yesterday-this morning when I evaluated him blood pressure was more stable with systolic in the low [id number] rate was mildly elevated between 101 108-he does not complaining any further dizziness.  .  Family medical social history as been reviewed per previous progress notes and admission note on 03/04/2013.  Medications have been reviewed per MAR.  Review of systems.  Gen. no complaints of fever chills although he says he feels somewhat weak.  Respiratory does not complain of cough or shortness of breath anymore.  Cardiac does not complaining of chest pain.   GI is not complaining of any nausea vomiting diarrhea or constipation or abdominal pain.  Muscle skeletal other than weakness is not complaining of discomfort or pain.  Neurologic does not complaining of dizziness or headache currently.  Physical exam.  Temperature is 97.2 pulse 104 respirations of 19 blood pressure  taken manually 104/50 I took  this throughout the day and continued to be in this range  In general this is a frail elderly male in no distress sitting comfortably in his wheelchair.  His skin is warm and dry he has numerous solar induced changes especially of the head and arms.  Eyes pupils are reactive to light sclera and conjunctiva are clear visual acuity appears intact.  Chest is clear to auscultation there is no labored breathing.  Heart is regular rate and rhythm mildly tachycardic with pulse ranging between 101 108 throughout the day-he has trace lower extremity edema.  Abdomen is soft nontender positive bowel sounds.  Muscle skeletal ambulates in a wheelchair strength appears to be intact at baseline all 4 extremities.  Neurologic no lateralizing finding his speech is clear.  Labs.  08/26/2013.  Sodium 139 potassium 4.2 BUN 17 creatinine 1.05.  08/22/2013.  WBC 9.6 hemoglobin 11.9 platelets 283.  Assessment plan.  #0-[id number] complicated with hypotension-again his Lopressor  is being held secondary to low blood pressures-clinically he appears stable is not complaining of further dizziness systolics have been largely in the low 100s during exams today-pulse has hovered in the low 100s below 110.  At this point will monitor and continue to hold the Lopressor since he does have trouble tolerating this-again this is a challenging situation but clinically he appears stable.  Continue to monitor blood pressures and pulses every shift call provider for pulse greater than 110  [id number]-of note greater than 25 minutes spent assessing patient and reassessing patient throughout the day x2-as well as formulating and coordinating a plan of care

## 2013-09-01 DIAGNOSIS — I959 Hypotension, unspecified: Secondary | ICD-10-CM | POA: Insufficient documentation

## 2013-09-02 DIAGNOSIS — M159 Polyosteoarthritis, unspecified: Secondary | ICD-10-CM | POA: Diagnosis not present

## 2013-09-02 DIAGNOSIS — I509 Heart failure, unspecified: Secondary | ICD-10-CM | POA: Diagnosis not present

## 2013-09-02 DIAGNOSIS — Z9181 History of falling: Secondary | ICD-10-CM | POA: Diagnosis not present

## 2013-09-02 DIAGNOSIS — M6281 Muscle weakness (generalized): Secondary | ICD-10-CM | POA: Diagnosis not present

## 2013-09-02 DIAGNOSIS — R279 Unspecified lack of coordination: Secondary | ICD-10-CM | POA: Diagnosis not present

## 2013-09-03 DIAGNOSIS — R279 Unspecified lack of coordination: Secondary | ICD-10-CM | POA: Diagnosis not present

## 2013-09-03 DIAGNOSIS — M159 Polyosteoarthritis, unspecified: Secondary | ICD-10-CM | POA: Diagnosis not present

## 2013-09-03 DIAGNOSIS — Z9181 History of falling: Secondary | ICD-10-CM | POA: Diagnosis not present

## 2013-09-03 DIAGNOSIS — I509 Heart failure, unspecified: Secondary | ICD-10-CM | POA: Diagnosis not present

## 2013-09-03 DIAGNOSIS — M6281 Muscle weakness (generalized): Secondary | ICD-10-CM | POA: Diagnosis not present

## 2013-09-04 DIAGNOSIS — M6281 Muscle weakness (generalized): Secondary | ICD-10-CM | POA: Diagnosis not present

## 2013-09-04 DIAGNOSIS — Z9181 History of falling: Secondary | ICD-10-CM | POA: Diagnosis not present

## 2013-09-04 DIAGNOSIS — I509 Heart failure, unspecified: Secondary | ICD-10-CM | POA: Diagnosis not present

## 2013-09-04 DIAGNOSIS — M159 Polyosteoarthritis, unspecified: Secondary | ICD-10-CM | POA: Diagnosis not present

## 2013-09-04 DIAGNOSIS — R279 Unspecified lack of coordination: Secondary | ICD-10-CM | POA: Diagnosis not present

## 2013-09-05 DIAGNOSIS — M6281 Muscle weakness (generalized): Secondary | ICD-10-CM | POA: Diagnosis not present

## 2013-09-05 DIAGNOSIS — Z9181 History of falling: Secondary | ICD-10-CM | POA: Diagnosis not present

## 2013-09-05 DIAGNOSIS — I70209 Unspecified atherosclerosis of native arteries of extremities, unspecified extremity: Secondary | ICD-10-CM | POA: Diagnosis not present

## 2013-09-05 DIAGNOSIS — M159 Polyosteoarthritis, unspecified: Secondary | ICD-10-CM | POA: Diagnosis not present

## 2013-09-05 DIAGNOSIS — I509 Heart failure, unspecified: Secondary | ICD-10-CM | POA: Diagnosis not present

## 2013-09-05 DIAGNOSIS — R279 Unspecified lack of coordination: Secondary | ICD-10-CM | POA: Diagnosis not present

## 2013-09-10 ENCOUNTER — Non-Acute Institutional Stay (SKILLED_NURSING_FACILITY): Payer: Medicare Other | Admitting: Internal Medicine

## 2013-09-10 DIAGNOSIS — R000 Tachycardia, unspecified: Secondary | ICD-10-CM | POA: Diagnosis not present

## 2013-09-10 DIAGNOSIS — G2581 Restless legs syndrome: Secondary | ICD-10-CM

## 2013-09-10 NOTE — Progress Notes (Signed)
Patient ID: Patrick [name], male   DOB: 1913-07-20, 80 y.o.   MRN: [id number]   this is an acute visit.  Level of care skilled.  Facility [facility]  Chief complaint-acute visit followup tachycardia-restless legs.  History of present illness.  Patient is a very pleasant 80 year old man with a recent history of tachycardia this was found incidentally.  This was a sinus tachycardia with a right bundle branch block and left anterior fascicular block.  He was started on Lopressor and this was titrated down secondary to hypotension and bradycardia-he had been on 6.25 mg twice a day-this was reduced to 3.125 mg twice a day on July 16-however pharmacy said it is not really possible to have a dose that small-nonetheless nursing says that they have been giving this to him occasionally apparently they're scoring tablet.  He does not get this frequently however because of his systolics are frequently under 100.  He says he feels much better clinically feels stronger he is eating better he states.  His only complaint is restless legs he is on Requip at night he would like to dose increase slightly he says he did not sleep much last night because his legs are restless.  Family medical social history as been reviewed her previous progress notes most recently 08/26/2013.  Medications have been reviewed per MAR.  Review of systems.  General no complaints of fever or chills.  Respiratory does not complain of shortness of breath or cough.  Cardiac no chest pain edema appears to be at baseline.  GI he does not complain of any abdominal issues says his appetite is better does not complaining of abdominal pain nausea or vomiting.  Or constipation.  Neurologic does not complaining of dizziness or headache.  Muscle skeletal does not complaining of joint pain.  Physical exam.  He is afebrile pulse is 64 respirations 19 blood pressure is variable as noted above recent range 637/85-88/50-YDXAJ systolics  seem to be under 100 for some frequency.--I took his blood pressure manually this evening and got 102/60  In general this is a pleasant elderly male in no distress sitting comfortably in his wheelchair.  Her skin is warm and dry.  Chest is clear to auscultation with somewhat shallow air entry no labored breathing.  Heart is regular rate and rhythm with I would say trace lower extremity edema.  Abdomen is protuberant soft he does have a history of hernia right side which is baseline-bowel sounds are active-abdomen is nontender.  Muscle skeletal continue with Feldene but strength appears to be intact bilaterally upper extremities has some lower extremity weakness which is not new.  Neurologic appears grossly intact no lateralizing findings.  Labs.  08/26/2013.  Sodium 139 potassium 4.2 BUN 17 creatinine 1.05.  08/22/2013.  WBC 9.6 hemoglobin 11.9 platelets 283.  Assessment and plan.  #1-tachycardia-I reviewed his recent pulses these are largely under 100 I see frequent 60s into the 90s appears to be more the baseline even a few in the high 50s -- seldom over 100-apparently he is not receiving the low dose Lopressor very often secondary to some lower blood pressures apparently when he gets it-at this point will discontinue this since its administration appears to be quite spotty and his pulse does appear to be controlled largely without receiving it---this will have to be monitored obviously every shift for now  #2-history of restless legs-apparently this is a long-term problem he says last night it's been worse-before adjusting medication would like to check his electrolytes will  get a CBC and BMP tomorrow if these are normal  consider increasing his Requip  [id number]  .

## 2013-09-11 DIAGNOSIS — I10 Essential (primary) hypertension: Secondary | ICD-10-CM | POA: Diagnosis not present

## 2013-09-11 DIAGNOSIS — Z79899 Other long term (current) drug therapy: Secondary | ICD-10-CM | POA: Diagnosis not present

## 2013-10-21 DIAGNOSIS — F329 Major depressive disorder, single episode, unspecified: Secondary | ICD-10-CM | POA: Diagnosis not present

## 2013-10-21 DIAGNOSIS — F3289 Other specified depressive episodes: Secondary | ICD-10-CM | POA: Diagnosis not present

## 2013-10-29 DIAGNOSIS — F3289 Other specified depressive episodes: Secondary | ICD-10-CM | POA: Diagnosis not present

## 2013-10-29 DIAGNOSIS — F329 Major depressive disorder, single episode, unspecified: Secondary | ICD-10-CM | POA: Diagnosis not present

## 2013-10-31 ENCOUNTER — Other Ambulatory Visit: Payer: Self-pay | Admitting: *Deleted

## 2013-10-31 MED ORDER — HYDROCODONE-ACETAMINOPHEN 5-325 MG PO TABS: ORAL_TABLET | ORAL | Status: DC

## 2013-10-31 NOTE — Telephone Encounter (Signed)
[facility] 

## 2013-11-13 ENCOUNTER — Non-Acute Institutional Stay (SKILLED_NURSING_FACILITY): Payer: Medicare Other | Admitting: Internal Medicine

## 2013-11-13 ENCOUNTER — Encounter: Payer: Self-pay | Admitting: Internal Medicine

## 2013-11-13 DIAGNOSIS — G2581 Restless legs syndrome: Secondary | ICD-10-CM

## 2013-11-13 DIAGNOSIS — L309 Dermatitis, unspecified: Secondary | ICD-10-CM

## 2013-11-13 DIAGNOSIS — R000 Tachycardia, unspecified: Secondary | ICD-10-CM

## 2013-11-13 DIAGNOSIS — L259 Unspecified contact dermatitis, unspecified cause: Secondary | ICD-10-CM

## 2013-11-13 DIAGNOSIS — R609 Edema, unspecified: Secondary | ICD-10-CM

## 2013-11-13 DIAGNOSIS — F32A Depression, unspecified: Secondary | ICD-10-CM

## 2013-11-13 DIAGNOSIS — N400 Enlarged prostate without lower urinary tract symptoms: Secondary | ICD-10-CM | POA: Diagnosis not present

## 2013-11-13 DIAGNOSIS — S72009D Fracture of unspecified part of neck of unspecified femur, subsequent encounter for closed fracture with routine healing: Secondary | ICD-10-CM | POA: Diagnosis not present

## 2013-11-13 DIAGNOSIS — I509 Heart failure, unspecified: Secondary | ICD-10-CM | POA: Diagnosis not present

## 2013-11-13 DIAGNOSIS — F3289 Other specified depressive episodes: Secondary | ICD-10-CM

## 2013-11-13 DIAGNOSIS — F329 Major depressive disorder, single episode, unspecified: Secondary | ICD-10-CM

## 2013-11-13 DIAGNOSIS — K219 Gastro-esophageal reflux disease without esophagitis: Secondary | ICD-10-CM

## 2013-11-13 NOTE — Progress Notes (Signed)
Patient ID: Patrick [name], male   DOB: 04/26/1913, 80 y.o.   MRN: [id number]   this is a routine visit  Level care skilled.  Facility [facility].   Chief complaint-medical management of chronic medical conditions including left hip fracture-CHF-restless leg-history tachycardia-history weight loss--depression and BPH .  History of present illness.  Patient is a very pleasant 80 year old male who was admitted to [facility] after sustaining a fall --he was diagnosed with a left femur neck fracture  This was surgically repaired with a heme arthroplasty apparently he did quite well with this.  He was initially here for strengthening and physical therapy--but has become long-term-care.  His other diagnoses include BPH--he is on Flomax and restless legs which he is on Requip for  He also has a history of depression and is on Zoloft.  Apparently shortly after his admission here he did have hypoxia with O2 saturation in the mid 80s-chest x-ray showed moderate bilateral effusions with bilateral passive atelectasis--he is now on low-dose Lasix and this appears to have been stable.  His stay here also has been complicated by tachycardia originally he was on Lopressor but his blood pressure did not really tolerate this well past the Lopressor has been discontinued nonetheless  pulse rate now appears to be controlled  He's also been treated for pneumonia and UTI during his stay here.  Today he has no acute complaints other than feels he should be having more regular bowel movements that she states he   did have one this morning but feels he would benefit from an added laxative     he had some weight loss but appears this has stabilized most recent weight is 169 it was 162 back in January. He is on Remeron as well as supplements    Previous medical history.  History of left femur fracture status post repair.  BPH.  Restless legs.  Depression.  History of cervical DJD.  Osteoarthritis.  History  of basal cell carcinoma.  Diverticulosis.  Hyperlipidemia.  Right inguinal hernia.  Ischemic heart disease.  Hiatal hernia.  History of kidney stones.  History of esophageal stricture and dilation in the past.  Recurrent TIAs.  Recurrent diverticulitis and perforation and exploratory laparotomy and sigmoid resection in June 2011.  GERD.  Hypertension .  Social history-patient apparently lived in an assisted living facility--he has a supportive son who lives in [place].  Has a very distant history of tobacco use almost 50 years ago no history of alcohol abuse .  Family history not pertinent.   Medication  review priming on   Review of systems  In general denies any fever chills  Skin does not complaining of any itching or rashes does have a history of basal cell carcinoma.  Head ears eyes nose mouth and throat-does not complaining of any sore throat visual changes nasal discharge  .  Respiratory no complaints of shortness of breath or cough-.  Cardiac- does have a history of coronary artery disease but does not complain of any chest pain does not really have significant lower extremity edema  GI-does not complaining of abdominal pain nausea vomiting diarrhea   Complains of constipation-- does have a history of an inguinal hernia.  GU-does not complaining of dysuria.  Muscle skeletal-says his hip pain joint pain is controlled .  Neurologic-does not complaining of any headache dizziness numbness-does have a history of restless leg.  Psych history of depression does not appear to be depressed t-- is in good spirits does not  complaining of depression or anxiety.   Physical exam.  Temperature 97.7 pulse 69 respirations 20 blood pressure 128/63 these appear to be relatively baseline weight is 168.8   In general this is a frail but quite alert and cognitively intact elderly male.  The skin is warm and dry he does have numerous sun-induced changes--on left side of his neck there is a  somewhat erythematous area with a crusted border and slight pigmentation in the center    Eyes pupils appear equal round react to light sclera and conjunctiva are clear he has prescription lenses visual acuity appears grossly intact.  Oropharynx is clear mucous membranes moist.  Chest he has decreased breath sounds at the bases but no rhonchi rales or wheezes no labored breathing.  Heart is regular rate and rhythm without murmur gallop or rub he he appears to have some edema of his left leg with reduced pedal pulse edema is. non-warm nontender  Abdomen is protuberant soft nontender he does have a well-healed surgical scar also note a right sided hernia--he does have bowel sounds in all 4 quadrants.  Muscle-  Moves all extremities x4 largely ambulates in a wheelchair strength appears to be intact all extremities there is no significant pain with extension or flexion of his left hip Neurologic-is grossly intact no focal deficits his speech is clear no lateralizing findings.  Psych he is alert and oriented x3 pleasant and appropriate .  Labs.  09/11/2013.  WBC 6.4 hemoglobin 12.9 platelets 162.  Sodium 141 potassium 4.8 BUN 15 creatinine 0.9  08/22/2013.  TSH 1.599   .  Assessment and plan.  #1-left hip fracture with repair-this appears to be stable    -#2- CHF-this appears to be stable he is on low-dose Lasix with potassium-Will update a metabolic panel .  Marland Kitchen  #3-history of restless legs this appears to be stable on Requip    #4-history of BPH-continues on Flomax apparently this has been stable .  #5-history of GERD-with hiatal hernia-he is on Protonix this appears to be relatively controlled .  #6-history depression he is on Zoloft this appears to be quite stable  #7-history of tachycardia-as noted this is somewhat challenging with his history of hypotension-at this point rate appears to be controlled he is no longer on Lopressor --pulses appear to be largely lately in the 60s  to 70s which is encouraging.  #8-left leg edema-of note he does have a previous history of left hip fracture-however this appears somewhat increased-will order a venous Doppler rule out any DVT.  #9-lower neck lesion??dermatitis---patient does have a history of skin cancer-we'll write for dermatology consult if family desires-otherwise will do watchful waiting.  #10 weight loss-this appears to have stabilized he is on Remeron as well as supplements.--Will order a metabolic panel to see wheret his albumin stands  #11 constipation-will add Colace 100 mg twice a day hold for diarrhea.  Of note also will update a CBC for updated values.  [id number] note greater than 35 minutes spent assessing patient-reviewing his chart-and coordinating and formulating a plan of care for numerous diagnoses--of note greater than 50% of time spent coordinating plan of care .  Marland Kitchen

## 2013-11-14 ENCOUNTER — Other Ambulatory Visit: Payer: Self-pay | Admitting: Internal Medicine

## 2013-11-14 ENCOUNTER — Ambulatory Visit (HOSPITAL_COMMUNITY)
Admission: RE | Admit: 2013-11-14 | Discharge: 2013-11-14 | Disposition: A | Discharge status: Home / Self Care | Payer: Medicare Other | Source: Ambulatory Visit | Attending: Internal Medicine | Admitting: Internal Medicine

## 2013-11-14 ENCOUNTER — Ambulatory Visit (HOSPITAL_COMMUNITY): Payer: Self-pay

## 2013-11-14 DIAGNOSIS — R609 Edema, unspecified: Secondary | ICD-10-CM

## 2013-11-14 DIAGNOSIS — R600 Localized edema: Secondary | ICD-10-CM | POA: Diagnosis not present

## 2013-11-25 DIAGNOSIS — F329 Major depressive disorder, single episode, unspecified: Secondary | ICD-10-CM | POA: Diagnosis not present

## 2013-12-04 ENCOUNTER — Non-Acute Institutional Stay (SKILLED_NURSING_FACILITY): Payer: Medicare Other | Admitting: Internal Medicine

## 2013-12-04 DIAGNOSIS — I509 Heart failure, unspecified: Secondary | ICD-10-CM | POA: Diagnosis not present

## 2013-12-04 DIAGNOSIS — R05 Cough: Secondary | ICD-10-CM

## 2013-12-04 DIAGNOSIS — R059 Cough, unspecified: Secondary | ICD-10-CM

## 2013-12-04 DIAGNOSIS — S91109A Unspecified open wound of unspecified toe(s) without damage to nail, initial encounter: Secondary | ICD-10-CM

## 2013-12-04 DIAGNOSIS — K219 Gastro-esophageal reflux disease without esophagitis: Secondary | ICD-10-CM | POA: Diagnosis not present

## 2013-12-04 NOTE — Progress Notes (Signed)
Patient ID: Patrick [name], male   DOB: [birth year], 80 y.o.   MRN: [id number]   Level care skilled.  Facility [facility].   Chief complaint--acute visit secondary to cold symptoms-left foot discomfort--medical management of chronic medical conditions including left hip fracture-CHF-restless leg-history tachycardia-history weight loss--depression and BPH  .  History of present illness.   Patient is a very pleasant 80 year old male who was admitted to [facility] after sustaining a fall --he was diagnosed with a left femur neck fracture  This was surgically repaired with a heme arthroplasty apparently he did quite well with this.  He was initially here for strengthening and physical therapy--but has become long-term-care.  His other diagnoses include BPH--he is on Flomax and restless legs which he is on Requip for  He also has a history of depression and is on Zoloft.  Apparently shortly after his admission here he did have hypoxia with O2 saturation in the mid 80s-chest x-ray showed moderate bilateral effusions with bilateral passive atelectasis--he is now on low-dose Lasix and this appears to have been stable.  His stay here also has been complicated by tachycardia originally he was on Lopressor but his blood pressure did not really tolerate this well past the Lopressor has been discontinued nonetheless pulse rate now appears to be controlled  He's also been treated for pneumonia and UTI during his stay here.   Tonight he is complaining of some cold symptoms also right foot discomfort-does not complaining of shortness of breath or chest pain  Previous medical history.  History of left femur fracture status post repair.  BPH.  Restless legs.  Depression.  History of cervical DJD.  Osteoarthritis.  History of basal cell carcinoma.  Diverticulosis.  Hyperlipidemia.  Right inguinal hernia.  Ischemic heart disease.  Hiatal hernia.  History of kidney stones.  History of esophageal  stricture and dilation in the past.  Recurrent TIAs.  Recurrent diverticulitis and perforation and exploratory laparotomy and sigmoid resection in June 2011.  GERD.  Hypertension  .  Social history-patient apparently lived in an assisted living facility--he has a supportive son who lives in [place].  Has a very distant history of tobacco use almost 50 years ago no history of alcohol abuse  .  Family history not pertinent.   Medication review per [facility]  Review of systems   In general denies any fever chills-- congestion-feels he has some nasal drainage  Skin does not complaining of any itching or rashes does have a history of basal cell carcinoma.  Head ears eyes nose mouth and throat-does not complaining of any sore throat  but does complain of some nasal congestion drainage and feeling like he has a cold .  Respiratory no complaints of shortness of breath but has a cough.  Cardiac- does have a history of coronary artery disease but does not complain of any chest pain does not really have significant lower extremity edema  GI-does not complaining of abdominal pain nausea vomiting diarrhea constipation-- does have a history of an inguinal hernia.  GU-does not complaining of dysuria.  Muscle skeletal-says his hip pain joint pain is controlled .  Neurologic-does not complaining of any headache dizziness numbness-does have a history of restless leg.  Psych history of depression does not appear to be depressed t-- is in good spirits does not complaining of depression or anxiety .  Physical exam.   He is afebrile pulse 70 respirations 18 blood pressure taken [place] 110/60-O2 saturations have been in the 90s on room air  weight is 173.4 this appears actually up about 5 pounds over the past month  In general this is a frail but quite alert and cognitively intact elderly male.  The skin is warm and dry he does have numerous sun-induced changes-and has recently seen dermatology I believe some  recent lesions were removed However I did note at the distal aspect of his left great toe there is an area of crusting with a small open area in the middle there is slight tenderness to palpation of this I did not appreciate any drainage  Eyes pupils appear equal round react to light sclera and conjunctiva are clear he has prescription lenses visual acuity appears grossly intact.  Oropharynx is clear mucous membranes moist--possibly a slight amount of clear drainage.  Chest he has decreased breath sounds at the bases but no rhonchi rales or wheezes no labored breathing.  Heart is regular rate and rhythm without murmur gallop or rub he he appears to have some edema of his left leg with reduced pedal pulse edema is. non-warm nontender--recent- Doppler was negative for any DVT   Abdomen is protuberant soft nontender he does have a well-healed surgical scar also note a right sided hernia--he does have bowel sounds in all 4 quadrants.  Muscle-  Moves all extremities x4 largely ambulates in a wheelchair strength appears to be intact all extremities there is no significant pain with extension or flexion of his left hip --- the left foot discomfort. To be mainly centered by the toe lesion as noted above--I did not note any deformity or pain with palpation of the foot . otherwise Neurologic-is grossly intact no focal deficits his speech is clear no lateralizing findings.  Psych he is alert and oriented x3 pleasant and appropriate  .  Labs.   11/18/2013.  WBC 6.1 hemoglobin 12.0 platelets 174.  Sodium 143 potassium 4.2 BUN 11 creatinine 0.9 albumin 3.0 otherwise liver function tests within normal limits 09/11/2013.  WBC 6.4 hemoglobin 12.9 platelets 162.  Sodium 141 potassium 4.8 BUN 15 creatinine 0.9  08/22/2013.  TSH 1.599  .  Assessment and plan.  #1-left hip fracture with repair-this appears to be stable   -#2- CHF-this appears to be stable he is on low-dose Lasix with potassium-Will update a  metabolic panel --it appears recent weight gain is more appetite related which is encouraging  .  Marland Kitchen  #3-history of restless legs this appears to be stable on Requip   #4-history of BPH-continues on Flomax apparently this has been stable  .  #5-history of GERD-with hiatal hernia-he is on Protonix this appears to be relatively controlled  .  #6-history depression he is on Zoloft this appears to be quite stable   #7-history of tachycardia-as noted this is somewhat challenging with his history of hypotension-at this point rate appears to be controlled he is no longer on Lopressor --pulses appear to be largely lately in the  70s which is encouraging--although I do see high readings at times but readings above  100 appeared be fairly rare .  #8-left leg edema-of note he does have a previous history of left hip fracture-however this appears baseline-recent Doppler was negative for DVT.  #9-history of cold type symptoms-patient does have a history of pneumonia and respiratory issues in the past-quite a fragile individual will treat this aggressively with a chest x-ray-also Mucinex 600 mg twice a day for 5 days and monitor while signs pulse ox every shift for 72 hours--also will check a CBC in the morning.  #  10 -- history of left  toe lesion-there is some tenderness here we'll empirically start doxycycline 100 mg twice a day for 7 days and have this followed closely by wound care--  #11-history constipation-he complained of this previously but appears the Colace which was added is helping at this point monitor.  #12-history dermatitis with numerous solar induced changes he has been seen by dermatology  [phone] note greater than 35 minutes spent assessing pat addressing his concerns at bedside-reviewing his chart-discussing his status with nursing staff-and coordinating and formulating a plan of care for numerous diagnoses-of note greater than 50% of time spent coordinating plan of  care  \   .

## 2013-12-05 ENCOUNTER — Other Ambulatory Visit: Payer: Self-pay | Admitting: Internal Medicine

## 2013-12-05 ENCOUNTER — Ambulatory Visit (HOSPITAL_COMMUNITY)
Admission: RE | Admit: 2013-12-05 | Discharge: 2013-12-05 | Disposition: A | Discharge status: Home / Self Care | Payer: Medicare Other | Source: Ambulatory Visit | Attending: Internal Medicine | Admitting: Internal Medicine

## 2013-12-05 ENCOUNTER — Encounter: Payer: Self-pay | Admitting: Internal Medicine

## 2013-12-05 DIAGNOSIS — R490 Dysphonia: Secondary | ICD-10-CM | POA: Insufficient documentation

## 2013-12-05 DIAGNOSIS — S91109A Unspecified open wound of unspecified toe(s) without damage to nail, initial encounter: Secondary | ICD-10-CM | POA: Insufficient documentation

## 2013-12-05 DIAGNOSIS — R05 Cough: Secondary | ICD-10-CM | POA: Diagnosis not present

## 2013-12-05 DIAGNOSIS — R059 Cough, unspecified: Secondary | ICD-10-CM

## 2013-12-05 DIAGNOSIS — J984 Other disorders of lung: Secondary | ICD-10-CM | POA: Diagnosis not present

## 2013-12-10 ENCOUNTER — Encounter: Payer: Self-pay | Admitting: *Deleted

## 2013-12-16 ENCOUNTER — Other Ambulatory Visit (HOSPITAL_COMMUNITY): Payer: Self-pay | Admitting: Podiatry

## 2013-12-16 DIAGNOSIS — G629 Polyneuropathy, unspecified: Secondary | ICD-10-CM | POA: Diagnosis not present

## 2013-12-16 DIAGNOSIS — L03116 Cellulitis of left lower limb: Secondary | ICD-10-CM | POA: Diagnosis not present

## 2013-12-16 DIAGNOSIS — I739 Peripheral vascular disease, unspecified: Secondary | ICD-10-CM

## 2013-12-16 DIAGNOSIS — L97522 Non-pressure chronic ulcer of other part of left foot with fat layer exposed: Secondary | ICD-10-CM | POA: Diagnosis not present

## 2013-12-16 DIAGNOSIS — L03119 Cellulitis of unspecified part of limb: Secondary | ICD-10-CM | POA: Diagnosis not present

## 2013-12-19 ENCOUNTER — Other Ambulatory Visit (HOSPITAL_COMMUNITY): Payer: Self-pay | Admitting: Podiatry

## 2013-12-19 ENCOUNTER — Ambulatory Visit (HOSPITAL_COMMUNITY)
Admission: RE | Admit: 2013-12-19 | Discharge: 2013-12-19 | Disposition: A | Discharge status: Home / Self Care | Payer: Medicare Other | Source: Ambulatory Visit | Attending: Podiatry | Admitting: Podiatry

## 2013-12-19 ENCOUNTER — Ambulatory Visit (HOSPITAL_COMMUNITY): Admission: RE | Admit: 2013-12-19 | Payer: Medicare Other | Source: Ambulatory Visit

## 2013-12-19 DIAGNOSIS — I739 Peripheral vascular disease, unspecified: Secondary | ICD-10-CM

## 2013-12-19 DIAGNOSIS — M25551 Pain in right hip: Secondary | ICD-10-CM

## 2013-12-19 DIAGNOSIS — R2242 Localized swelling, mass and lump, left lower limb: Secondary | ICD-10-CM | POA: Insufficient documentation

## 2013-12-19 DIAGNOSIS — M7989 Other specified soft tissue disorders: Secondary | ICD-10-CM | POA: Diagnosis not present

## 2013-12-20 ENCOUNTER — Other Ambulatory Visit (HOSPITAL_COMMUNITY): Payer: Self-pay | Admitting: Podiatry

## 2013-12-20 DIAGNOSIS — I739 Peripheral vascular disease, unspecified: Secondary | ICD-10-CM

## 2013-12-23 ENCOUNTER — Other Ambulatory Visit (HOSPITAL_COMMUNITY): Payer: Self-pay

## 2013-12-23 ENCOUNTER — Ambulatory Visit (HOSPITAL_COMMUNITY)
Admission: RE | Admit: 2013-12-23 | Discharge: 2013-12-23 | Disposition: A | Discharge status: Home / Self Care | Payer: Medicare Other | Source: Ambulatory Visit | Attending: Podiatry | Admitting: Podiatry

## 2013-12-23 ENCOUNTER — Ambulatory Visit (HOSPITAL_COMMUNITY): Admission: RE | Admit: 2013-12-23 | Payer: Medicare Other | Source: Ambulatory Visit

## 2013-12-23 DIAGNOSIS — I739 Peripheral vascular disease, unspecified: Secondary | ICD-10-CM | POA: Insufficient documentation

## 2013-12-23 DIAGNOSIS — L97529 Non-pressure chronic ulcer of other part of left foot with unspecified severity: Secondary | ICD-10-CM | POA: Insufficient documentation

## 2013-12-23 DIAGNOSIS — Z87891 Personal history of nicotine dependence: Secondary | ICD-10-CM | POA: Insufficient documentation

## 2013-12-23 DIAGNOSIS — E785 Hyperlipidemia, unspecified: Secondary | ICD-10-CM | POA: Insufficient documentation

## 2013-12-24 ENCOUNTER — Ambulatory Visit (HOSPITAL_COMMUNITY): Payer: Medicare Other

## 2014-01-07 ENCOUNTER — Other Ambulatory Visit: Payer: Self-pay

## 2014-01-07 MED ORDER — HYDROCODONE-ACETAMINOPHEN 5-325 MG PO TABS: ORAL_TABLET | ORAL | Status: DC

## 2014-01-07 NOTE — Telephone Encounter (Signed)
RX faxed to [facility] @ [phone]. Phone number [phone]  

## 2014-01-23 ENCOUNTER — Non-Acute Institutional Stay (SKILLED_NURSING_FACILITY): Payer: Medicare Other | Admitting: Internal Medicine

## 2014-01-23 DIAGNOSIS — I5032 Chronic diastolic (congestive) heart failure: Secondary | ICD-10-CM

## 2014-01-23 DIAGNOSIS — R000 Tachycardia, unspecified: Secondary | ICD-10-CM | POA: Diagnosis not present

## 2014-01-23 DIAGNOSIS — S72009D Fracture of unspecified part of neck of unspecified femur, subsequent encounter for closed fracture with routine healing: Secondary | ICD-10-CM | POA: Diagnosis not present

## 2014-01-23 DIAGNOSIS — H6123 Impacted cerumen, bilateral: Secondary | ICD-10-CM | POA: Diagnosis not present

## 2014-01-23 DIAGNOSIS — F329 Major depressive disorder, single episode, unspecified: Secondary | ICD-10-CM

## 2014-01-23 DIAGNOSIS — N400 Enlarged prostate without lower urinary tract symptoms: Secondary | ICD-10-CM

## 2014-01-23 DIAGNOSIS — F32A Depression, unspecified: Secondary | ICD-10-CM

## 2014-01-23 NOTE — Progress Notes (Signed)
Patient ID: Patrick [name], male   DOB: 04-Jan-1914, 80 y.o.   MRN: [id number] Patient ID: Patrick [name], male   DOB: 03/23/1913, 80 y.o.   MRN: [id number]   Level care skilled.  Facility [facility].   Chief complaint----medical management of chronic medical conditions including left hip fracture-CHF-restless leg-history tachycardia-history weight loss--depression and BPH  .  History of present illness.   Patient is a very pleasant 80 year old male who was admitted to [facility] after sustaining a fall --he was diagnosed with a left femur neck fracture  This was surgically repaired with a heme arthroplasty apparently he did quite well with this.  He was initially here for strengthening and physical therapy--but has become long-term-care.  His other diagnoses include BPH--he is on Flomax and restless legs which he is on Requip for  He also has a history of depression and is on Zoloft.  Apparently shortly after his admission here he did have hypoxia with O2 saturation in the mid 80s-chest x-ray showed moderate bilateral effusions with bilateral passive atelectasis--he is now on low-dose Lasix and this appears to have been stable.  His stay here also has been complicated by tachycardia originally he was on Lopressor but his blood pressure did not really tolerate this well past the Lopressor has been discontinued nonetheless pulse rate now appears to be controlled  He's also been treated for pneumonia and UTI during his stay here.    Despite numerous issues he is been stable now for some time-his only complaint today is  feels his ears are stuffed up he does not complaining of any ear pain but feels he has some wax  His also had history of a left great toe wound-this is being managed by wound care and apparently is resolving  Previous medical history.  History of left femur fracture status post repair.  BPH.  Restless legs.  Depression.  History of cervical DJD.  Osteoarthritis.  History  of basal cell carcinoma.  Diverticulosis.  Hyperlipidemia.  Right inguinal hernia.  Ischemic heart disease.  Hiatal hernia.  History of kidney stones.  History of esophageal stricture and dilation in the past.  Recurrent TIAs.  Recurrent diverticulitis and perforation and exploratory laparotomy and sigmoid resection in June 2011.  GERD.  Hypertension  .  Social history-patient apparently lived in an assisted living facility--he has a supportive son who lives in [place].  Has a very distant history of tobacco use almost 50 years ago no history of alcohol abuse  .  Family history not pertinent.   Medication review per [facility]  Review of systems   In general denies any fever chills--  Skin does not complaining of any itching or rashes does have a history of basal cell carcinoma--left great toe wound as noted above .  Head ears eyes nose mouth and throat-does not complaining of any sore throat  .  Respiratory no complaints of shortness of breath but has a cough.  Cardiac- does have a history of coronary artery disease but does not complain of any chest pain does not really have significant lower extremity edema  GI-does not complaining of abdominal pain nausea vomiting diarrhea constipation-- does have a history of an inguinal hernia.  GU-does not complaining of dysuria.  Muscle skeletal-says his hip pain joint pain is controlled .  Neurologic-does not complaining of any headache dizziness numbness-does have a history of restless leg.  Psych history of depression does not appear to be depressed t-- is in good spirits does not  complaining of depression or anxiety .  Physical exam.   Temperature 97.5 pulse 80 respirations 20 blood pressure 114/51 weight is 178.0 this appears again about 5 pounds over the past several months apparently eats quite well  In general this is a frail but quite alert and cognitively intact elderly male.  The skin is warm and dry he does have numerous  sun-induced changes-and has recently seen dermatology I believe some recent lesions were removed--left great toe there appears to be some dry scaling-also some slightly dry blood around the nail margins there is minimal tenderness I do not see any drainage or what appears to be active cellulitis    Eyes pupils appear equal round react to light sclera and conjunctiva are clear he has prescription lenses visual acuity appears grossly intact.  Ears-he has visible wax in both ear canals I do not see any drainage bleeding   Oropharynx is clear mucous membranes moist--possibly a slight amount of clear drainage.  Chest he has decreased breath sounds at the bases but no rhonchi rales or wheezes no labored breathing.  Heart is regular rate and rhythm without murmur gallop or rub he appears to have some mild edema of his left leg this is not new Doppler has been negative in the past    Abdomen is protuberant soft nontender he does have a well-healed surgical scar also note a right sided hernia--he does have bowel sounds in all 4 quadrants.  Muscle-  Moves all extremities x4 largely ambulates in a wheelchair strength appears to be intact all extremities there is no significant pain with extension or flexion of his left hip ---  Neurologic-is grossly intact no focal deficits his speech is clear no lateralizing findings.  Psych he is alert and oriented x3 pleasant and appropriate  .  Labs.  12/05/2013.  Sodium 141 potassium 4.7 BUN 14 creatinine 1.06.  WBC 7.8 hemoglobin 11.9 platelets 190.     11/18/2013.  WBC 6.1 hemoglobin 12.0 platelets 174.  Sodium 143 potassium 4.2 BUN 11 creatinine 0.9 albumin 3.0 otherwise liver function tests within normal limits 09/11/2013.  WBC 6.4 hemoglobin 12.9 platelets 162.  Sodium 141 potassium 4.8 BUN 15 creatinine 0.9  08/22/2013.  TSH 1.599  .  Assessment and plan.  #1-left hip fracture with repair-this appears to be stable   -#2- CHF-this appears to  be stable he is on low-dose Lasix with potassium-Will update a metabolic panel --it appears recent weight gain is more appetite related which is encouraging  .  Marland Kitchen  #3-history of restless legs this appears to be stable on Requip   #4-history of BPH-continues on Flomax apparently this has been stable  .  #5-history of GERD-with hiatal hernia-he is on Protonix this appears to be relatively controlled  .  #6-history depression he is on Zoloft this appears to be quite stable   #7-history of tachycardia-as noted this is somewhat challenging with his history of hypotension-at this point rate appears to be controlled he is no longer on Lopressor --pulses appear to be largely lately in the  70s-80 which is encouraging-- .  #8-left leg edema-of note he does have a previous history of left hip fracture-however this appears baseline-recent Doppler was negative for DVT.  #9 Earwax-will order Debrox drops for 3 days and flush with warm water on day for   --  #10-history constipation-he complained of this previously but appears the Colace which was added is helping at this point monitor.  #11-history dermatitis with numerous solar  induced changes he has been seen by dermatology  #12-anemia-this appears to be fairly mild will update CBC  CPT- 99309  \   .

## 2014-01-26 ENCOUNTER — Encounter: Payer: Self-pay | Admitting: Internal Medicine

## 2014-01-27 DIAGNOSIS — I10 Essential (primary) hypertension: Secondary | ICD-10-CM | POA: Diagnosis not present

## 2014-01-27 DIAGNOSIS — D649 Anemia, unspecified: Secondary | ICD-10-CM | POA: Diagnosis not present

## 2014-02-17 ENCOUNTER — Other Ambulatory Visit: Payer: Self-pay | Admitting: Internal Medicine

## 2014-02-17 ENCOUNTER — Non-Acute Institutional Stay (SKILLED_NURSING_FACILITY): Payer: Medicare Other | Admitting: Internal Medicine

## 2014-02-17 ENCOUNTER — Ambulatory Visit (HOSPITAL_COMMUNITY)
Admission: RE | Admit: 2014-02-17 | Discharge: 2014-02-17 | Disposition: A | Discharge status: Home / Self Care | Payer: Medicare Other | Source: Ambulatory Visit | Attending: Internal Medicine | Admitting: Internal Medicine

## 2014-02-17 DIAGNOSIS — I70209 Unspecified atherosclerosis of native arteries of extremities, unspecified extremity: Secondary | ICD-10-CM | POA: Diagnosis not present

## 2014-02-17 DIAGNOSIS — M7989 Other specified soft tissue disorders: Secondary | ICD-10-CM | POA: Diagnosis not present

## 2014-02-17 DIAGNOSIS — M1990 Unspecified osteoarthritis, unspecified site: Secondary | ICD-10-CM

## 2014-02-17 DIAGNOSIS — M79672 Pain in left foot: Secondary | ICD-10-CM | POA: Insufficient documentation

## 2014-02-17 DIAGNOSIS — M129 Arthropathy, unspecified: Secondary | ICD-10-CM | POA: Diagnosis not present

## 2014-02-20 NOTE — Progress Notes (Signed)
Patient ID: Patrick [name], male   DOB: Mar 09, 1913, 81 y.o.   MRN: [id number]               PROGRESS NOTE  DATE:  02/17/2014          FACILITY: [facility]     LEVEL OF CARE:   SNF   Acute Visit   HISTORY OF PRESENT ILLNESS:  This is a longstanding resident of this facility, coming to [place] after suffering a left femoral neck fracture roughly a year ago.  He underwent a left hip hemiarthroplasty.    He has a history of congestive heart failure, asymptomatic direct right inguinal hernia.    I was asked to look at his left foot.  There have been ulcerations and pain in his first and third toes.  Although somebody thought he had a history of gout, I do not see this.    He apparently has had recurrent wounds on the plantar aspect of his left first toe.  He had arterial dopplers that did not show significant PAD, although they could not measure an ABI on the left due to noncompressible vessels.  His right-sided study was 1.02.    I also note that he has had two duplex ultrasounds of the left leg to rule out DVT, on 11/14/2013 and then again, I gather by accident, in November.  Neither of these showed DVT.      PHYSICAL EXAMINATION:   VASCULAR/CIRCULATION:    ARTERIAL:  Left foot:  There does not appear to be an arterial issue here.  He has hammertoes.   EDEMA/VARICOSITIES:  He has significant pitting edema of the left greater than right leg.  I certainly see why he had the two duplex ultrasounds, although it is difficult to justify doing a third at this point.  He appears to be otherwise asymptomatic.   MUSCULOSKELETAL:   EXTREMITIES:   LEFT LOWER EXTREMITY:    He appears to have acute arthritis involving the DIPs of the first and third toes.  Marked tenderness here.  I wonder whether this is crystal arthropathy.    SKIN:  INSPECTION:  He has a small wound on the plantar aspect of his left first toe, hammer deformity.  There are also pressure areas on the third toe, plantar  aspect, although this does not seem as ominous.    ASSESSMENT/PLAN:                                       Acute arthritis in the left first and third toes, as described.  I will x-ray the foot, give him five days of Voltaren.  At some point, check a uric acid level on him.    Swelling of the left leg.  He has had two duplex ultrasounds done in the last three months.  I will follow clinically.    Wounds on the toes.  Probably neuropathic.  Hammertoe deformity.  Using DuoDerm and Kerlix.     CPT CODE: 27062

## 2014-02-24 ENCOUNTER — Non-Acute Institutional Stay (SKILLED_NURSING_FACILITY): Payer: Medicare Other | Admitting: Internal Medicine

## 2014-02-24 ENCOUNTER — Other Ambulatory Visit: Payer: Self-pay

## 2014-02-24 DIAGNOSIS — M1990 Unspecified osteoarthritis, unspecified site: Secondary | ICD-10-CM

## 2014-02-24 DIAGNOSIS — M129 Arthropathy, unspecified: Secondary | ICD-10-CM

## 2014-02-24 MED ORDER — HYDROCODONE-ACETAMINOPHEN 5-325 MG PO TABS: ORAL_TABLET | ORAL | Status: DC

## 2014-02-24 NOTE — Telephone Encounter (Signed)
RX faxed to [facility] @ [phone]. Phone number [phone]  

## 2014-02-26 NOTE — Progress Notes (Signed)
Patient ID: Patrick [name], male   DOB: 05/17/1913, 81 y.o.   MRN: [id number]               PROGRESS NOTE  DATE:  02/24/2014       FACILITY: [facility]      LEVEL OF CARE:   SNF   Acute Visit   CHIEF COMPLAINT:  Follow up left foot.    HISTORY OF PRESENT ILLNESS:  I saw this man's left foot last week due to pain and ulcerations on his first and third toes.  At the time I saw this, he had what appeared to be some form of acute arthritis in the DIPs of his first and third toes on the left.  He had callus over the base of his toes and all of these are hammer type deformities.  He has had a duplex ultrasound and arterial doppler in the past that did not show significant PAD.  His right-sided ABI was 1.02 (could not be calculated on the left).    Last week when I saw him, I gave him a five-day course of Voltaren.  X-rays did not show chondrocalcinosis.    PHYSICAL EXAMINATION:   MUSCULOSKELETAL:   EXTREMITIES:   LEFT LOWER EXTREMITY:  Left foot:  Again, there does not appear to be a macrovascular issue here.  He has hammertoes.  The left first toe appears to be much better in terms of erythema and pain, although there is an open area on the plantar aspect.  The third toe is now more uniformly red and is exquisitely tender in both the DIP and PIP, but there is no spontaneous pain.   There is no pain across the metatarsals.   CIRCULATION:  EDEMA/VARICOSITIES:  Extremities:  He has edema in both legs, left greater than right, although duplex ultrasounds have not been positive.    ASSESSMENT/PLAN:                                 Acute arthritis of both the left first and third toes.  This is better in the first, worse in the third.  I am going to give him a course of prednisone to see if I can settle this down.    Wounds on the toes.  Neuropathic in the setting of hammertoes.  There is now a uniform open area on the plantar aspect of the first and third toes.  I am going to change  the dressing to collagen and Kerlix.    I do not think this is an arterial ulcer.  He has had previous ABIs in November 2015.  I do not think this is a microvascular issue in his toes.  I will continue to follow this expectantly.  The wounds have been dressed as noted above.     CPT CODE: 54656

## 2014-03-09 ENCOUNTER — Inpatient Hospital Stay
Admission: RE | Admit: 2014-03-09 | Discharge: 2014-07-24 | Disposition: A | Discharge status: Nursing Home (Includes ICF) | Payer: BLUE CROSS/BLUE SHIELD | Source: Ambulatory Visit | Attending: Internal Medicine | Admitting: Internal Medicine

## 2014-03-17 ENCOUNTER — Non-Acute Institutional Stay (SKILLED_NURSING_FACILITY): Payer: Medicare Other | Admitting: Internal Medicine

## 2014-03-17 DIAGNOSIS — L03032 Cellulitis of left toe: Secondary | ICD-10-CM

## 2014-03-18 DIAGNOSIS — F329 Major depressive disorder, single episode, unspecified: Secondary | ICD-10-CM | POA: Diagnosis not present

## 2014-03-18 NOTE — Progress Notes (Addendum)
Patient ID: ACEL NATZKE, male   DOB: 02-01-1914, 81 y.o.   MRN: [id number]               PROGRESS NOTE  DATE:  03/17/2014                            FACILITY: [facility]        LEVEL OF CARE:   SNF   Acute Visit   CHIEF COMPLAINT:  Follow up left foot.    HISTORY OF PRESENT ILLNESS:  I am again seeing this man's foot due to complaints of pain and continued ulcerations on his first and third toes.  At first, I thought the pain was acute arthritis in the DIPs of his first and third toes.  He has had arterial dopplers that did not show PAD significantly.  He has also had venous dopplers that did not show a DVT.    Once again, he is complaining of pain in his right great toe.  He has continued wounds at the plantar aspect of his first and third hammertoes.    PHYSICAL EXAMINATION:   SKIN:  INSPECTION:   Left foot:  Once again, callus over the first and third toes.  He is nonambulatory, but pushes himself along with his feet.  This could be the issue here.  There is pain at the base of the nail on the left first toe, which seems more likely to be a fungal paronychia than anything else.  He also has a large, erythematous patch that is nontender on the lateral aspect of the left foot.  There is no evidence of a cellulitis here.     ASSESSMENT/PLAN:                         What would appear to be a fungal paronychia in the left first toe.    Rash on the lateral aspect of the left foot.  I do not think this is cellulitis.  Possibly some combination of a dermatitis and/or a tineal rash.    ulcerattions on his first and third toes: need callous removal and pressure relief  He does not have arterial insufficiency.

## 2014-04-08 ENCOUNTER — Non-Acute Institutional Stay (SKILLED_NURSING_FACILITY): Payer: Medicare Other | Admitting: Internal Medicine

## 2014-04-08 ENCOUNTER — Encounter: Payer: Self-pay | Admitting: Internal Medicine

## 2014-04-08 DIAGNOSIS — N400 Enlarged prostate without lower urinary tract symptoms: Secondary | ICD-10-CM | POA: Diagnosis not present

## 2014-04-08 DIAGNOSIS — S72002D Fracture of unspecified part of neck of left femur, subsequent encounter for closed fracture with routine healing: Secondary | ICD-10-CM | POA: Diagnosis not present

## 2014-04-08 DIAGNOSIS — K219 Gastro-esophageal reflux disease without esophagitis: Secondary | ICD-10-CM

## 2014-04-08 DIAGNOSIS — R609 Edema, unspecified: Secondary | ICD-10-CM | POA: Diagnosis not present

## 2014-04-08 DIAGNOSIS — G2581 Restless legs syndrome: Secondary | ICD-10-CM

## 2014-04-08 DIAGNOSIS — R000 Tachycardia, unspecified: Secondary | ICD-10-CM | POA: Diagnosis not present

## 2014-04-08 MED ORDER — RANITIDINE HCL 150 MG/10ML PO SYRP: 150.0000 mg | ORAL_SOLUTION | Freq: Two times a day (BID) | ORAL | Status: DC

## 2014-04-08 NOTE — Progress Notes (Signed)
Patient ID: Patrick [name], male   DOB: [birth year], 81 y.o.   MRN: [id number]   Level care skilled.  Facility [facility].   Chief complaint----medical management of chronic medical conditions including left hip fracture-CHF-restless leg-history tachycardia-history weight loss--depression and BPH  .  History of present illness.   Patient is a very pleasant 81 year old male who was admitted to [facility] after sustaining a fall --he was diagnosed with a left femur neck fracture  This was surgically repaired with a heme arthroplasty apparently he did quite well with this.  He was initially here for strengthening and physical therapy--but has become long-term-care.  His other diagnoses include BPH--he is on Flomax and restless legs which he is on Requip for  He also has a history of depression and is on Zoloft.  Apparently shortly after his admission here he did have hypoxia with O2 saturation in the mid 80s-chest x-ray showed moderate bilateral effusions with bilateral passive atelectasis--he is now on low-dose Lasix and this appears to have been stable.  His stay here also has been complicated by tachycardia originally he was on Lopressor but his blood pressure did not really tolerate this well past the Lopressor has been discontinued nonetheless pulse rate now appears to be controlled --I do not see any recent tachycardic or bradycardic readings He's also been treated for pneumonia and UTI during his stay here.    Despite numerous issues he is been stable now for some time-his only complaint today is some left leg edema which does not appear to be totally new--he does have a history of left foot and toe issues with history of calluses on his great toe and third toe as well as some erythema-he is receiving a topical cream and is followed by wound care for the rash as well as for the calluses Dr. Dellia Nims also has evaluated this-    Previous medical history.  History of left femur fracture status  post repair.  BPH.  Restless legs.  Depression.  History of cervical DJD.  Osteoarthritis.  History of basal cell carcinoma.  Diverticulosis.  Hyperlipidemia.  Right inguinal hernia.  Ischemic heart disease.  Hiatal hernia.  History of kidney stones.  History of esophageal stricture and dilation in the past.  Recurrent TIAs.  Recurrent diverticulitis and perforation and exploratory laparotomy and sigmoid resection in June 2011.  GERD.  Hypertension  .  Social history-patient apparently lived in an assisted living facility--he has a supportive son who lives in [place].  Has a very distant history of tobacco use almost 50 years ago no history of alcohol abuse  .  Family history not pertinent.   Medication review per [facility]  Review of systems   In general denies any fever chills--  Skin does not complaining of any itching or rashes does have a history of basal cell carcinoma--leftfoot issues  as noted above .  Head ears eyes nose mouth and throat-does not complaining of any sore throat  .  Respiratory no complaints of shortness of breath or cough.  Cardiac- does have a history of coronary artery disease but does not complain of any chest pain --has some left leg edema  GI-does not complaining of abdominal pain nausea vomiting diarrhea constipation-- does have a history of an inguinal hernia.  GU-does not complaining of dysuria.  Muscle skeletal-says his hip pain joint pain is controlled .  Neurologic-does not complaining of any headache dizziness numbness-does have a history of restless leg.  Psych history of depression does not  appear to be depressed -- is in good spirits does not complain of depression or anxiety .  Physical exam.    He is afebrile pulses 70 respirations 19 blood pressure 101/ 49-1 38/62 in this range again this appears to be stable his weight is 181.2 this appears actually to be slowly rising--apparently eats well  In general this is a frail but quite  alert and cognitively intact elderly male.  The skin is warm and dry  Continues to have some erythema of his left distal foot this is pale erythema and is being treated with topical cream by wound care-he also has calluses as noted above on his great toe and third toe this does not appear to be infected currently    Eyes pupils appear equal round react to light sclera and conjunctiva are clear he has prescription lenses visual acuity appears grossly intact.   Oropharynx is clear mucous membranes moist--.  Chest he has decreased breath sounds at the bases but no rhonchi rales or wheezes no labored breathing.  Heart is regular rate and rhythm without murmur gallop or rub he appears to have  edema of his left leg this is not new-- Doppler has been negative in the past    Abdomen is protuberant soft nontender he does have a well-healed surgical scar also note a right sided hernia--he does have bowel sounds in all 4 quadrants.  Muscle-  Moves all extremities x4 largely ambulates in a wheelchair strength appears to be intact all extremities there is no significant pain with extension or flexion of his left hip ---  Neurologic-is grossly intact no focal deficits his speech is clear no lateralizing findings.  Psych he is alert and oriented x3 pleasant and appropriate  .  Labs  01/27/2014.  WBC 6.5 hemoglobin 11.4 platelets 178.  Sodium 142 potassium 4.2 BUN 13 creatinine 1.03  .  12/05/2013.  Sodium 141 potassium 4.7 BUN 14 creatinine 1.06.  WBC 7.8 hemoglobin 11.9 platelets 190.     11/18/2013.  WBC 6.1 hemoglobin 12.0 platelets 174.  Sodium 143 potassium 4.2 BUN 11 creatinine 0.9 albumin 3.0 otherwise liver function tests within normal limits 09/11/2013.  WBC 6.4 hemoglobin 12.9 platelets 162.  Sodium 141 potassium 4.8 BUN 15 creatinine 0.9  08/22/2013.  TSH 1.599  .  Assessment and plan.  #1-left hip fracture with repair-this appears to be stable   -#2- CHF-this appears  to be stable he is on low-dose Lasix with potassium-Will update a metabolic panel --it appears recent weight gain is more appetite relate  .  Marland Kitchen  #3-history of restless legs this appears to be stable on Requip   #4-history of BPH-continues on Flomax apparently this has been stable  .  #5-history of GERD-with hiatal hernia-he is on Zantac this appears to be relatively controlled  .  #6-history depression he is on Zoloft this appears to be quite stable   #7-history of tachycardia-as noted this is somewhat challenging with his history of hypotension-at this point rate appears to be controlled he is no longer on Lopressor --pulses appear to be controlled- .  #8-left leg edema-of note he does have a previous history of left hip fracture-however this appears baseline-Dopplers have been negative for DVT-per discussion wound care apparently compression hose has been discussed in the past I feel this is probably warranted also encouraged leg elevation.    --  #9-history constipation-he complained of this previously but appears the Colace which was added is helping at this point monitor.   #  12-anemia-this appears to be fairly mild will update CBC  CPT- 99309  \

## 2014-04-10 ENCOUNTER — Encounter (HOSPITAL_COMMUNITY)
Admission: RE | Admit: 2014-04-10 | Discharge: 2014-04-10 | Disposition: A | Discharge status: Home / Self Care | Payer: Medicare Other | Source: Skilled Nursing Facility | Attending: Internal Medicine | Admitting: Internal Medicine

## 2014-04-10 DIAGNOSIS — I509 Heart failure, unspecified: Secondary | ICD-10-CM | POA: Diagnosis not present

## 2014-04-10 DIAGNOSIS — I959 Hypotension, unspecified: Secondary | ICD-10-CM | POA: Diagnosis not present

## 2014-04-10 LAB — CBC WITH DIFFERENTIAL/PLATELET
BASOS ABS: 0 10*3/uL (ref 0.0–0.1)
Basophils Relative: 0 % (ref 0–1)
Eosinophils Absolute: 0.3 10*3/uL (ref 0.0–0.7)
Eosinophils Relative: 4 % (ref 0–5)
HEMATOCRIT: 40.2 % (ref 39.0–52.0)
Hemoglobin: 12.9 g/dL — ABNORMAL LOW (ref 13.0–17.0)
LYMPHS ABS: 1.7 10*3/uL (ref 0.7–4.0)
LYMPHS PCT: 21 % (ref 12–46)
MCH: 29.7 pg (ref 26.0–34.0)
MCHC: 32.1 g/dL (ref 30.0–36.0)
MCV: 92.4 fL (ref 78.0–100.0)
Monocytes Absolute: 0.8 10*3/uL (ref 0.1–1.0)
Monocytes Relative: 10 % (ref 3–12)
NEUTROS PCT: 65 % (ref 43–77)
Neutro Abs: 5.4 10*3/uL (ref 1.7–7.7)
PLATELETS: 186 10*3/uL (ref 150–400)
RBC: 4.35 MIL/uL (ref 4.22–5.81)
RDW: 13.6 % (ref 11.5–15.5)
WBC: 8.2 10*3/uL (ref 4.0–10.5)

## 2014-04-10 LAB — BASIC METABOLIC PANEL
Anion gap: 8 (ref 5–15)
BUN: 14 mg/dL (ref 6–23)
CO2: 30 mmol/L (ref 19–32)
CREATININE: 0.99 mg/dL (ref 0.50–1.35)
Calcium: 8.8 mg/dL (ref 8.4–10.5)
Chloride: 104 mmol/L (ref 96–112)
GFR calc non Af Amer: 66 mL/min — ABNORMAL LOW (ref >90)
GFR, EST AFRICAN AMERICAN: 76 mL/min — AB (ref >90)
Glucose, Bld: 121 mg/dL — ABNORMAL HIGH (ref 70–99)
POTASSIUM: 4.4 mmol/L (ref 3.5–5.1)
Sodium: 142 mmol/L (ref 135–145)

## 2014-04-30 DIAGNOSIS — F329 Major depressive disorder, single episode, unspecified: Secondary | ICD-10-CM | POA: Diagnosis not present

## 2014-05-02 ENCOUNTER — Other Ambulatory Visit (HOSPITAL_COMMUNITY)
Admission: RE | Admit: 2014-05-02 | Discharge: 2014-05-02 | Disposition: A | Discharge status: Home / Self Care | Payer: Medicare Other | Source: Other Acute Inpatient Hospital | Attending: Internal Medicine | Admitting: Internal Medicine

## 2014-05-02 ENCOUNTER — Non-Acute Institutional Stay (SKILLED_NURSING_FACILITY): Payer: Medicare Other | Admitting: Internal Medicine

## 2014-05-02 DIAGNOSIS — S72002D Fracture of unspecified part of neck of left femur, subsequent encounter for closed fracture with routine healing: Secondary | ICD-10-CM

## 2014-05-02 DIAGNOSIS — I509 Heart failure, unspecified: Secondary | ICD-10-CM | POA: Insufficient documentation

## 2014-05-02 DIAGNOSIS — K219 Gastro-esophageal reflux disease without esophagitis: Secondary | ICD-10-CM | POA: Insufficient documentation

## 2014-05-02 DIAGNOSIS — S91109D Unspecified open wound of unspecified toe(s) without damage to nail, subsequent encounter: Secondary | ICD-10-CM | POA: Diagnosis not present

## 2014-05-02 DIAGNOSIS — R000 Tachycardia, unspecified: Secondary | ICD-10-CM

## 2014-05-02 DIAGNOSIS — I959 Hypotension, unspecified: Secondary | ICD-10-CM | POA: Diagnosis not present

## 2014-05-02 DIAGNOSIS — N400 Enlarged prostate without lower urinary tract symptoms: Secondary | ICD-10-CM | POA: Diagnosis not present

## 2014-05-02 LAB — CBC WITH DIFFERENTIAL/PLATELET
BASOS PCT: 0 % (ref 0–1)
Basophils Absolute: 0 10*3/uL (ref 0.0–0.1)
Eosinophils Absolute: 0.2 10*3/uL (ref 0.0–0.7)
Eosinophils Relative: 3 % (ref 0–5)
HCT: 38.5 % — ABNORMAL LOW (ref 39.0–52.0)
Hemoglobin: 12.4 g/dL — ABNORMAL LOW (ref 13.0–17.0)
Lymphocytes Relative: 19 % (ref 12–46)
Lymphs Abs: 1.5 10*3/uL (ref 0.7–4.0)
MCH: 30 pg (ref 26.0–34.0)
MCHC: 32.2 g/dL (ref 30.0–36.0)
MCV: 93.2 fL (ref 78.0–100.0)
MONO ABS: 1 10*3/uL (ref 0.1–1.0)
Monocytes Relative: 13 % — ABNORMAL HIGH (ref 3–12)
NEUTROS PCT: 65 % (ref 43–77)
Neutro Abs: 5.2 10*3/uL (ref 1.7–7.7)
Platelets: 194 10*3/uL (ref 150–400)
RBC: 4.13 MIL/uL — ABNORMAL LOW (ref 4.22–5.81)
RDW: 13.9 % (ref 11.5–15.5)
WBC: 8.1 10*3/uL (ref 4.0–10.5)

## 2014-05-02 LAB — BASIC METABOLIC PANEL
ANION GAP: 6 (ref 5–15)
BUN: 13 mg/dL (ref 6–23)
CHLORIDE: 103 mmol/L (ref 96–112)
CO2: 29 mmol/L (ref 19–32)
Calcium: 8.7 mg/dL (ref 8.4–10.5)
Creatinine, Ser: 1.12 mg/dL (ref 0.50–1.35)
GFR calc Af Amer: 61 mL/min — ABNORMAL LOW (ref >90)
GFR calc non Af Amer: 53 mL/min — ABNORMAL LOW (ref >90)
Glucose, Bld: 109 mg/dL — ABNORMAL HIGH (ref 70–99)
POTASSIUM: 4.7 mmol/L (ref 3.5–5.1)
Sodium: 138 mmol/L (ref 135–145)

## 2014-05-02 NOTE — Progress Notes (Signed)
Patient ID: DESHAWN WITTY, male   DOB: August 11, 1913, 81 y.o.   MRN: [id number]   This is a routine visit.  Level care skilled.  Facility [facility].   Chief complaint----medical management of chronic medical conditions including left hip fracture-CHF-restless leg-history tachycardia-history weight loss--depression and BPH  .  History of present illness.   Patient is a very pleasant 81 year old male who was admitted to [facility] after sustaining a fall --he was diagnosed with a left femur neck fracture  This was surgically repaired with a heme arthroplasty apparently he did quite well with this.  He was initially here for strengthening and physical therapy--but has become long-term-care.  His other diagnoses include BPH--he is on Flomax and has an indwelling catheter and restless legs which he is on Requip for  He also has a history of depression and is on Zoloft.  Apparently shortly after his admission here he did have hypoxia with O2 saturation in the mid 80s-chest x-ray showed moderate bilateral effusions with bilateral passive atelectasis--he is now on low-dose Lasix and this appears to have been stable.  His stay here also has been complicated by tachycardia originally he was on Lopressor but his blood pressure did not really tolerate this well --t the Lopressor has been discontinued nonetheless pulse rate now appears to be controlled --I do not see any recent tachycardic or bradycardic readings He's also been treated for pneumonia and UTI during his stay here.     --he does have a history of left foot and toe issues with history of calluses on his great toe and third toe as well as some erythema-he is receiving a topical cream and is followed by wound care --s Dr. Dellia Nims also has evaluated this- nursing feels he possibly has some mild increased erythema up patient area-wound care nurses evaluated this as well and thinks this is relatively baseline although he has responded apparently in  the past to a  course of doxycycline when  this was thought to be increased  Patient also has a history of GERD he is currently on Zantac but he tells me today's having episodes about 3 times a week rate does have significant reflux feels like he is got a small amount of what he calls vomit from what his description sounds more like regurgitation.  He had been on a PPI in the past apparently did a bit better with this I suspect we will put him on Prilosec and monitor this   Previous medical history.  History of left femur fracture status post repair.  BPH.  Restless legs.  Depression.  History of cervical DJD.  Osteoarthritis.  History of basal cell carcinoma.  Diverticulosis.  Hyperlipidemia.  Right inguinal hernia.  Ischemic heart disease.  Hiatal hernia.  History of kidney stones.  History of esophageal stricture and dilation in the past.  Recurrent TIAs.  Recurrent diverticulitis and perforation and exploratory laparotomy and sigmoid resection in June 2011.  GERD.  Hypertension  .  Social history-patient apparently lived in an assisted living facility--he has a supportive son who lives in [place].  Has a very distant history of tobacco use almost 50 years ago no history of alcohol abuse  .  Family history not pertinent.   Medication review per [facility]  Review of systems   In general denies any fever chills--  Skin does not complaining of any itching or rashes does have a history of basal cell carcinoma--leftfoot issues  as noted above .  Head ears eyes nose  mouth and throat-does not complaining of any sore throat  .  Respiratory no complaints of shortness of breath or cough.  Cardiac- does have a history of coronary artery disease but does not complain of any chest pain --has some left leg edema  GI-does not complaining of abdominal pain nausea vomiting diarrhea constipation-- does have a history of an inguinal hernia.--Also history of GERD which appears to be somewhat  symptomatic as noted above  GU-does not complaining of dysuria.  Muscle skeletal-says his hip pain joint pain is controlled .  Neurologic-does not complaining of any headache dizziness numbness-does have a history of restless leg.  Psych history of depression does not appear to be depressed -- is in good spirits does not complain of depression or anxiety .  Physical exam.     Temperature 98.3 pulse 76 respirations 20 blood pressure 107/54-119/58 most recently pulses appeared to be more in the 60s and 70s weight is stable at 182  In general this is a frail but quite alert and cognitively intact elderly male--actually eating peanut butter and crackers when I arrived in the room.  The skin is warm and dry--patient has numerous solar induced changes with scaling these do not appear to be grossly changed from baseline   I note his left foot is covered with Kerlix dressing this has been evaluated by nursing and wound care today per wound care nurse this appears relatively baseline possibly some minimally increased erythema extending more in the ankle area-this does not appear acutely warm to touch but apparently has responded to a course of doxycycline in the past w    Eyes pupils appear equal round react to light sclera and conjunctiva are clear he has prescription lenses visual acuity appears grossly intact.   Oropharynx is clear mucous membranes moist--.  Chest he has decreased breath sounds at the bases but no rhonchi rales or wheezes no labored breathing.  Heart is regular rate and rhythm without murmur gallop or rub he appears to have  edema of his left leg this is not new-- Doppler has been negative in the past    Abdomen is protuberant soft nontender he does have a well-healed surgical scar also note a right sided hernia--he does have bowel sounds in all 4 quadrants.  Muscle-  Moves all extremities x4 largely ambulates in a wheelchair strength appears to be intact all extremities there  is no significant pain with extension or flexion of his left hip ---  Neurologic-is grossly intact no focal deficits his speech is clear no lateralizing findings.  Psych he is alert and oriented x3 pleasant and appropriate  .  Labs  05/02/2014.  WBC 8.1 hemoglobin 12.4 platelets 194.  Sodium 138 potassium 4.7 BUN 13 creatinine 1.12  04/10/2014.  Sodium 142 potassium 4.4 BUN 14 creatinine 0.99.  WBC 8.2 hemoglobin 12.9 platelets 186  01/27/2014.  WBC 6.5 hemoglobin 11.4 platelets 178.  Sodium 142 potassium 4.2 BUN 13 creatinine 1.03  .  12/05/2013.  Sodium 141 potassium 4.7 BUN 14 creatinine 1.06.  WBC 7.8 hemoglobin 11.9 platelets 190.     11/18/2013.  WBC 6.1 hemoglobin 12.0 platelets 174.  Sodium 143 potassium 4.2 BUN 11 creatinine 0.9 albumin 3.0 otherwise liver function tests within normal limits 09/11/2013.  WBC 6.4 hemoglobin 12.9 platelets 162.  Sodium 141 potassium 4.8 BUN 15 creatinine 0.9  08/22/2013.  TSH 1.599  .  Assessment and plan.  #1-left hip fracture with repair-this appears to be stable   -#2- CHF-this appears  to be stable he is on low-dose Lasix with potassium-metabolic panel done today was unremarkable .  Marland Kitchen  #3-history of restless legs this appears to be stable on Requip   #4-history of BPH-continues on Flomax apparently this has been stable  .  #5-history of GERD-with hiatal hernia-he is on Zantac apparently is having some increased symptoms Will switch to Prilosec and monitor  .  #6-history depression he is on Zoloft this appears to be quite stable   #7-history of tachycardia-as noted this is somewhat challenging with his history of hypotension-at this point rate appears to be controlled he is no longer on Lopressor --pulses appear to be controlled- .  #8-left leg edema-of note he does have a previous history of left hip fracture-however this appears baseline-Dopplers have been negative for DVT     --  #9-history  constipation-he complained of this previously but appears the Colace which was added is helping at this point monitor.  #10 history of weight loss-he is on Remeron weight is actually slowly going up which is encouraging he is on numerous supplements including Magic cup and ensure-.  #11-toel foot and shin issues-as noted above this is followed closely by wound care-will empirically start him on doxycycline-with follow-up as needed by Dr. Dellia Nims on Monday--he is not running a fever---r white count is within normal range today.  [id number] note greater than 35 minutes spent assessing patient discussing his concerns as well as nursing's concerns-reviewing his chart-and coordinating and formulating a plan of care for numerous diagnoses-of note greater than 50% of time spent coordinating plan of care with nursing input    9  \

## 2014-05-05 ENCOUNTER — Non-Acute Institutional Stay (SKILLED_NURSING_FACILITY): Payer: Medicare Other | Admitting: Internal Medicine

## 2014-05-05 DIAGNOSIS — S91109D Unspecified open wound of unspecified toe(s) without damage to nail, subsequent encounter: Secondary | ICD-10-CM

## 2014-05-05 DIAGNOSIS — M1990 Unspecified osteoarthritis, unspecified site: Secondary | ICD-10-CM

## 2014-05-05 DIAGNOSIS — M129 Arthropathy, unspecified: Secondary | ICD-10-CM | POA: Diagnosis not present

## 2014-05-07 ENCOUNTER — Other Ambulatory Visit: Payer: Self-pay | Admitting: *Deleted

## 2014-05-07 MED ORDER — HYDROCODONE-ACETAMINOPHEN 5-325 MG PO TABS: ORAL_TABLET | ORAL | Status: DC

## 2014-05-08 NOTE — Progress Notes (Signed)
Patient ID: Patrick [name], male   DOB: May 14, 1913, 81 y.o.   MRN: [id number]                PROGRESS NOTE  DATE:  05/05/2014                   FACILITY: [facility]    LEVEL OF CARE:   SNF   Acute Visit                   CHIEF COMPLAINT:  Review of issues with regards to this patient's left foot.    HISTORY OF PRESENT ILLNESS:  The patient largely has three issues here:      He has chronic swelling of the leg with episodic erythema.  He has had venous ultrasounds which have been negative for a DVT.  I suspect that he has some degree of chronic venous insufficiency behind this.  He always seems to end up on antibiotics, which do not really help.    Ulcerations on his plantar aspect of his first and third toe, historically.  Apparently, this is just the first toe.  He has hammertoes here and most of this appears to be friction-related on his shoe.  We have done arterial dopplers before.  They do not show significant macrovascular disease.  It is not impossible that he has small vessel disease here.    Finally, he has episodic erythema, predominantly involving the DIP and PIP of the first toe and also probably the PIP of the third toe.  This is tender.  He does not have gout.  I have felt that this might be pseudogout versus ischemia.    PHYSICAL EXAMINATION:  Left foot:        VASCULAR:   ARTERIAL:   The peripheral pulses appear to be stable.   SKIN:   INSPECTION:  He has an ulceration on the plantar aspect of his third toe, which is chronic.  This needs to be debrided, which I will take care of today.  The issue is that he apparently pushes himself along in the wheelchair using this toe.  I think this is the issue of nonhealing here.   MUSCULOSKELETAL:   EXTREMITIES:   LEFT LOWER EXTREMITY:  He still has tenderness in the first and third toe with erythema here.  I think this is some form of arthritis.  I note that I have given him, I think, NSAIDs and/or steroids in the  past for this issue.  It gets better, but seems to come back in short order.    ASSESSMENT/PLAN:                                Left foot.  Again, there seems to be tenderness in the first and third toe.  I think this is in a joint distribution.  I will give him a course of steroids to see if this settles down again.    Ulceration on the tip of his first toe.  Again, this is hammered so that any pressure he puts on this is directly over this area.  I debrided the area with a #15 scalpel, with Lidocaine, which he tolerated well.   This is not in any way, shape or form ready for healing like this.   He will need Santyl and some form of pressure relief.    It is worthwhile noting, though it is  hard to exclude this, that he has had reasonably normal arterial and venous dopplers.          CPT CODE: 31497

## 2014-05-28 ENCOUNTER — Non-Acute Institutional Stay (SKILLED_NURSING_FACILITY): Payer: Medicare Other | Admitting: Internal Medicine

## 2014-05-28 ENCOUNTER — Ambulatory Visit (HOSPITAL_COMMUNITY)
Admission: RE | Admit: 2014-05-28 | Discharge: 2014-05-28 | Disposition: A | Discharge status: Home / Self Care | Payer: Medicare Other | Source: Ambulatory Visit | Attending: Internal Medicine | Admitting: Internal Medicine

## 2014-05-28 ENCOUNTER — Encounter (HOSPITAL_COMMUNITY)
Admit: 2014-05-28 | Discharge: 2014-05-28 | Disposition: A | Discharge status: Home / Self Care | Payer: Medicare Other | Source: Skilled Nursing Facility | Attending: Internal Medicine | Admitting: Internal Medicine

## 2014-05-28 ENCOUNTER — Other Ambulatory Visit: Payer: Self-pay | Admitting: Internal Medicine

## 2014-05-28 DIAGNOSIS — R410 Disorientation, unspecified: Secondary | ICD-10-CM

## 2014-05-28 DIAGNOSIS — R05 Cough: Secondary | ICD-10-CM | POA: Diagnosis present

## 2014-05-28 DIAGNOSIS — R531 Weakness: Secondary | ICD-10-CM | POA: Diagnosis not present

## 2014-05-28 DIAGNOSIS — J189 Pneumonia, unspecified organism: Secondary | ICD-10-CM

## 2014-05-28 DIAGNOSIS — Z79899 Other long term (current) drug therapy: Secondary | ICD-10-CM | POA: Diagnosis not present

## 2014-05-28 DIAGNOSIS — N10 Acute tubulo-interstitial nephritis: Secondary | ICD-10-CM

## 2014-05-28 DIAGNOSIS — K439 Ventral hernia without obstruction or gangrene: Secondary | ICD-10-CM | POA: Diagnosis not present

## 2014-05-28 DIAGNOSIS — Z7982 Long term (current) use of aspirin: Secondary | ICD-10-CM | POA: Diagnosis not present

## 2014-05-28 LAB — URINALYSIS, ROUTINE W REFLEX MICROSCOPIC
Bilirubin Urine: NEGATIVE
GLUCOSE, UA: NEGATIVE mg/dL
KETONES UR: NEGATIVE mg/dL
Nitrite: POSITIVE — AB
Specific Gravity, Urine: 1.015 (ref 1.005–1.030)
Urobilinogen, UA: 0.2 mg/dL (ref 0.0–1.0)
pH: 7 (ref 5.0–8.0)

## 2014-05-28 LAB — URINE MICROSCOPIC-ADD ON

## 2014-05-29 ENCOUNTER — Encounter (HOSPITAL_COMMUNITY)
Admit: 2014-05-29 | Discharge: 2014-05-29 | Disposition: A | Discharge status: Home / Self Care | Payer: Medicare Other | Source: Skilled Nursing Facility | Attending: Internal Medicine | Admitting: Internal Medicine

## 2014-05-29 DIAGNOSIS — Z79899 Other long term (current) drug therapy: Secondary | ICD-10-CM | POA: Diagnosis not present

## 2014-05-29 DIAGNOSIS — Z7982 Long term (current) use of aspirin: Secondary | ICD-10-CM | POA: Diagnosis not present

## 2014-05-29 DIAGNOSIS — K439 Ventral hernia without obstruction or gangrene: Secondary | ICD-10-CM | POA: Diagnosis not present

## 2014-05-29 DIAGNOSIS — R05 Cough: Secondary | ICD-10-CM | POA: Diagnosis not present

## 2014-05-29 DIAGNOSIS — R531 Weakness: Secondary | ICD-10-CM | POA: Diagnosis not present

## 2014-05-29 LAB — COMPREHENSIVE METABOLIC PANEL
ALBUMIN: 3.2 g/dL — AB (ref 3.5–5.2)
ALK PHOS: 85 U/L (ref 39–117)
ALT: 23 U/L (ref 0–53)
AST: 60 U/L — AB (ref 0–37)
Anion gap: 6 (ref 5–15)
BILIRUBIN TOTAL: 0.7 mg/dL (ref 0.3–1.2)
BUN: 14 mg/dL (ref 6–23)
CO2: 29 mmol/L (ref 19–32)
Calcium: 8.6 mg/dL (ref 8.4–10.5)
Chloride: 102 mmol/L (ref 96–112)
Creatinine, Ser: 1 mg/dL (ref 0.50–1.35)
GFR calc Af Amer: 70 mL/min — ABNORMAL LOW (ref >90)
GFR, EST NON AFRICAN AMERICAN: 60 mL/min — AB (ref >90)
Glucose, Bld: 108 mg/dL — ABNORMAL HIGH (ref 70–99)
Potassium: 4 mmol/L (ref 3.5–5.1)
Sodium: 137 mmol/L (ref 135–145)
Total Protein: 6.9 g/dL (ref 6.0–8.3)

## 2014-05-29 LAB — CBC WITH DIFFERENTIAL/PLATELET
BASOS PCT: 0 % (ref 0–1)
Basophils Absolute: 0 10*3/uL (ref 0.0–0.1)
Eosinophils Absolute: 0.1 10*3/uL (ref 0.0–0.7)
Eosinophils Relative: 1 % (ref 0–5)
HCT: 36.6 % — ABNORMAL LOW (ref 39.0–52.0)
HEMOGLOBIN: 11.8 g/dL — AB (ref 13.0–17.0)
LYMPHS ABS: 1.9 10*3/uL (ref 0.7–4.0)
Lymphocytes Relative: 18 % (ref 12–46)
MCH: 29.4 pg (ref 26.0–34.0)
MCHC: 32.2 g/dL (ref 30.0–36.0)
MCV: 91.3 fL (ref 78.0–100.0)
MONO ABS: 1.7 10*3/uL — AB (ref 0.1–1.0)
MONOS PCT: 16 % — AB (ref 3–12)
NEUTROS ABS: 6.6 10*3/uL (ref 1.7–7.7)
Neutrophils Relative %: 64 % (ref 43–77)
Platelets: 158 10*3/uL (ref 150–400)
RBC: 4.01 MIL/uL — ABNORMAL LOW (ref 4.22–5.81)
RDW: 13.9 % (ref 11.5–15.5)
WBC: 10.2 10*3/uL (ref 4.0–10.5)

## 2014-05-30 ENCOUNTER — Non-Acute Institutional Stay (SKILLED_NURSING_FACILITY): Payer: Medicare Other | Admitting: Internal Medicine

## 2014-05-30 DIAGNOSIS — S72002D Fracture of unspecified part of neck of left femur, subsequent encounter for closed fracture with routine healing: Secondary | ICD-10-CM

## 2014-05-30 DIAGNOSIS — R21 Rash and other nonspecific skin eruption: Secondary | ICD-10-CM | POA: Diagnosis not present

## 2014-05-30 DIAGNOSIS — N10 Acute tubulo-interstitial nephritis: Secondary | ICD-10-CM

## 2014-05-30 DIAGNOSIS — R634 Abnormal weight loss: Secondary | ICD-10-CM

## 2014-05-30 DIAGNOSIS — I5032 Chronic diastolic (congestive) heart failure: Secondary | ICD-10-CM

## 2014-05-30 NOTE — Progress Notes (Signed)
Patient ID: Patrick [name], male   DOB: [birth year], 81 y.o.   MRN: [id number]       :         is a routine visit Level care skilled.  Facility [facility]   Chief complaint----medical management of chronic medical conditions including left hip fracture-CHF-restless leg-history tachycardia-history weight loss--depression and BPH  Acute visit follow-up suspected UTI-neck rash .  History of present illness.   Patient is a very pleasant 81 year old male who was admitted to [facility] after sustaining a fall --he was diagnosed with a left femur neck fracture  This was surgically repaired with a hemi arthroplasty apparently he did quite well with this.  He was initially here for strengthening and physical therapy--but has become long-term-care.  His other diagnoses include BPH--he is on Flomax and restless legs which he is on Requip for  He also has a history of depression and is on Zoloft.  Apparently shortly after his admission here he did have hypoxia with O2 saturation in the mid 80s-chest x-ray showed moderate bilateral effusions with bilateral passive atelectasis--he is now on low-dose Lasix and this appears to have been stable.  His stay here also has been complicated by tachycardia originally he was on Lopressor but his blood pressure did not really tolerate this well past the Lopressor has been discontinued nonetheless pulse rate now appears to be controlled --I do not see any recent tachycardic or bradycardic readings He's also been treated for pneumonia and UTI during his stay here Fact Dr. Dellia Nims saw him earlier this week since patient apparently had spiked a temperature and appeared to not be feeling well-a urine culture was ordered which has come back showing greater than 100,000 colonies of gram-negative rods.  However. States he is feeling s better he has been afebrile recently--  His main complaint is an itchy rash on the back of his neck.     Previous medical history.   History of left femur fracture status post repair.  BPH.  Restless legs.  Depression.  History of cervical DJD.  Osteoarthritis.  History of basal cell carcinoma.  Diverticulosis.  Hyperlipidemia.  Right inguinal hernia.  Ischemic heart disease.  Hiatal hernia.  History of kidney stones.  History of esophageal stricture and dilation in the past.  Recurrent TIAs.  Recurrent diverticulitis and perforation and exploratory laparotomy and sigmoid resection in June 2011.  GERD.  Hypertension  .  Social history-patient apparently lived in an assisted living facility--he has a supportive son who lives in [place].  Has a very distant history of tobacco use almost 50 years ago no history of alcohol abuse  .  Family history not pertinent.   Medication review per [facility]  Review of systems   In general denies any fever chills--  Skin --planes of neck rash that itches Has left foot wound that wound care is following apparently this is stable .  Head ears eyes nose mouth and throat-does not complaining of any sore throat  .  Respiratory no complaints of shortness of breath or cough.  Cardiac- does have a history of coronary artery disease but does not complain of any chest pain --has some left leg edema  GI-does not complaining of abdominal pain nausea vomiting diarrhea constipation-- does have a history of an inguinal hernia.  GU-does not complaining of overt dysuria today apparently had complain of some earlier this week.  Muscle skeletal-says his hip pain joint pain is controlled .  Neurologic-does not complaining of any headache  dizziness numbness-does have a history of restless leg.  Psych history of depression does not appear to be depressed -- is in good spirits does not complain of depression or anxiety .  Physical exam.   Currently he is afebrile pulse of 70 respirations 18 blood pressure 114/56 weight is stable at 182.2  In general this is a frail but quite alert and  cognitively intact elderly male.  The skin is warm and dry--he has a small rash in the back of his neck-this appears to have several small well defined areas possibly raised borders   Left foot wound is followed by wound care-    Eyes pupils appear equal round react to light sclera and conjunctiva are clear he has prescription lenses visual acuity appears grossly intact.   Oropharynx is clear mucous membranes moist--.  Chest he has decreased breath sounds at the bases but no rhonchi rales or wheezes no labored breathing.  Heart is regular rate and rhythm without murmur gallop or rub he appears to have  edema of his left leg this is not new-- Doppler has been negative in the past    Abdomen is protuberant soft nontender he does have a well-healed surgical scar also note a right sided hernia--he does have bowel sounds in all 4 quadrants.   GU-possibly some quite mild suprapubic tenderness I did not note any distention  Muscle-  Moves all extremities x4 largely ambulates in a wheelchair strength appears to be intact all extremities there is no significant pain with extension or flexion of his left hip ---  Neurologic-is grossly intact no focal deficits his speech is clear no lateralizing findings.  Psych he is alert and oriented x3 pleasant and appropriate  .  Labs  05/29/2014.  Sodium 137 potassium 4 BUN 14 creatinine 1.00.  AST 60 otherwise liver function tests within normal limits.  WBC 10.2 hemoglobin 11.8 platelets 158.    01/27/2014.  WBC 6.5 hemoglobin 11.4 platelets 178.  Sodium 142 potassium 4.2 BUN 13 creatinine 1.03  .  12/05/2013.  Sodium 141 potassium 4.7 BUN 14 creatinine 1.06.  WBC 7.8 hemoglobin 11.9 platelets 190.     11/18/2013.  WBC 6.1 hemoglobin 12.0 platelets 174.  Sodium 143 potassium 4.2 BUN 11 creatinine 0.9 albumin 3.0 otherwise liver function tests within normal limits 09/11/2013.  WBC 6.4 hemoglobin 12.9 platelets 162.  Sodium 141  potassium 4.8 BUN 15 creatinine 0.9  08/22/2013.  TSH 1.599  .  Assessment and plan.  #1-left hip fracture with repair-this appears to be stable   -#2- CHF-this appears to be stable he is on low-dose Lasix with potassium-updated metabolic panel shows stability--it's a been stable .  Marland Kitchen  #3-history of restless legs this appears to be stable on Requip   #4-history of BPH-continues on Flomax apparently this has been stable  .  #5-history of GERD-with hiatal hernia-he is on Zantac this appears to be relatively controlled  .  #6-history depression he is on Zoloft this appears to be quite stable   #7-history of tachycardia-as noted this is somewhat challenging with his history of hypotension-at this point rate appears to be controlled he is no longer on Lopressor --pulses appear to be controlled chart review recently in the 60s-80s.  Blood pressure appears well controlled I see 168/65 listed however manual was 114/56 previous listed readings 137/63 129/74- .  #8-left leg edema-of note he does have a previous history of left hip fracture-however this appears baseline-Dopplers have been negative .    --  #  9-history constipation-he complained of this previously but appears the Colace which was added is helping at this point monitor.   #12-anemia-this appears to be stable most recent hemoglobin 11.8  #13-neck rash-will treat with Nizoral cream twice a day-monitor for resolution.  #14 UTI-we do not have the specific organism yet-we will empirically start him on Cipro since apparently did have a fever and felt quite ill earlier this week-will await final culture results he appears to be doing well today however  #15-weight loss-this appears to have stabilized I do note he is on low-dose Remeron he actually appears to be doing quite well  CPT- 99310--of note greater than 35 minutes spent assessing patient discussing his status with nursing staff-addressing his concerns at bedside-reviewing  his chart-and coordinating and formulating a plan of care for numerous diagnoses-of note greater than 50% of time spent coordinating plan of care  \

## 2014-05-31 LAB — URINE CULTURE

## 2014-06-01 ENCOUNTER — Encounter: Payer: Self-pay | Admitting: Internal Medicine

## 2014-06-01 DIAGNOSIS — R21 Rash and other nonspecific skin eruption: Secondary | ICD-10-CM | POA: Insufficient documentation

## 2014-06-03 NOTE — Progress Notes (Signed)
Patient ID: Patrick [name], male   DOB: 1913/11/25, 81 y.o.   MRN: [id number]                PROGRESS NOTE  DATE:  05/28/2014         FACILITY: [facility]                     LEVEL OF CARE:   SNF   Acute Visit                   CHIEF COMPLAINT:  Lethargy.         HISTORY OF PRESENT ILLNESS:  I saw this patient today.  The staff had noted that he is increasingly weak.  Normally, the patient is able to assist with transferring, push himself around in the wheelchair.  He is simply more weak and unable to transfer and push himself in the wheelchair.    There was some report that he had a cold and cough, although he has not been running a fever.     CURRENT MEDICATIONS:  Medication list is reviewed.        ASA 81 q.d.       Dulcolax 10 q.d.       Colace 100 b.i.d.       Hydrocodone/APAP 5/325 q.6 hours p.r.n.       Albuterol nebulizers q.6 p.r.n.       Lasix 20 q.d.        Flomax 0.4 q.d.           Zoloft 50 q.d.        REVIEW OF SYSTEMS:    CHEST/RESPIRATORY:   The patient has been having a cough, but no sputum and no shortness of breath.    CARDIAC:  No chest pain.   GI:  No abdominal pain.  No diarrhea.    GU:  He has a chronic Foley catheter in place.        PHYSICAL EXAMINATION:   VITAL SIGNS:   TEMPERATURE:  98.2.          O2 SATURATIONS:  95% on room air.     RESPIRATIONS:    22.     PULSE:     78 and regular.     BLOOD PRESSURE:  98/48.      GENERAL APPEARANCE:   CHEST/RESPIRATORY:  Clear air entry.         CARDIOVASCULAR:   CARDIAC:  Heart sounds are normal.  He does not appear to be dehydrated.      GASTROINTESTINAL:   HERNIA:  There is a very large ventral, ?incisional hernia in the right lower abdomen.   This does not reduce easily, but is not very tender.     ABDOMEN:  There are no other masses.     GENITOURINARY:   BLADDER:  Possibly some suprapubic tenderness.     ASSESSMENT/PLAN:                 Altered functional status.  I am  concerned he might have a UTI.  I am going to order a urine culture.  For now, no empiric antibiotics.  The catheter will be changed.  We will check lab work on him tomorrow including a comprehensive metabolic panel, CBC with differential.  Given the cough, I will send him for a chest x-ray, although I think this is unlikely to be productive.  I do not think this represents an influenza  type syndrome.     CPT CODE: 56701

## 2014-07-02 DIAGNOSIS — L408 Other psoriasis: Secondary | ICD-10-CM | POA: Diagnosis not present

## 2014-07-02 DIAGNOSIS — X32XXXD Exposure to sunlight, subsequent encounter: Secondary | ICD-10-CM | POA: Diagnosis not present

## 2014-07-02 DIAGNOSIS — C44219 Basal cell carcinoma of skin of left ear and external auricular canal: Secondary | ICD-10-CM | POA: Diagnosis not present

## 2014-07-02 DIAGNOSIS — L570 Actinic keratosis: Secondary | ICD-10-CM | POA: Diagnosis not present

## 2014-07-07 ENCOUNTER — Non-Acute Institutional Stay (SKILLED_NURSING_FACILITY): Payer: Medicare Other | Admitting: Internal Medicine

## 2014-07-07 DIAGNOSIS — M1990 Unspecified osteoarthritis, unspecified site: Secondary | ICD-10-CM

## 2014-07-07 DIAGNOSIS — M129 Arthropathy, unspecified: Secondary | ICD-10-CM | POA: Diagnosis not present

## 2014-07-07 DIAGNOSIS — S91109D Unspecified open wound of unspecified toe(s) without damage to nail, subsequent encounter: Secondary | ICD-10-CM | POA: Diagnosis not present

## 2014-07-09 DIAGNOSIS — B351 Tinea unguium: Secondary | ICD-10-CM | POA: Diagnosis not present

## 2014-07-09 DIAGNOSIS — L97829 Non-pressure chronic ulcer of other part of left lower leg with unspecified severity: Secondary | ICD-10-CM | POA: Diagnosis not present

## 2014-07-09 DIAGNOSIS — I70245 Atherosclerosis of native arteries of left leg with ulceration of other part of foot: Secondary | ICD-10-CM | POA: Diagnosis not present

## 2014-07-09 DIAGNOSIS — M79674 Pain in right toe(s): Secondary | ICD-10-CM | POA: Diagnosis not present

## 2014-07-11 NOTE — Progress Notes (Addendum)
Patient ID: Patrick [name], male   DOB: 1913-05-27, 81 y.o.   MRN: [id number]                PROGRESS NOTE  DATE:  07/07/2014        FACILITY: [facility]                     LEVEL OF CARE:   SNF   Acute Visit                        CHIEF COMPLAINT:   Left foot pain.      HISTORY OF PRESENT ILLNESS:  I episodically see Patrick [name], who is a gentleman who has chronic wounds on the base of his left first and third toes.  The third one seems calloused over at the moment, whereas the first one has remained open.    The patient complains of increasing pain in the left first toe.  Indeed, he will not even put this on the floor.  Previously, I have thought that this might be some form of migratory arthritis in the joints of his toes such as pseudogout, although he has been treated for cellulitis.      Noteworthy that we have done several dopplers to rule out DVT of the left leg.  The last one was done in November 2015.  We also did arterial studies and the ABI on the left could not be calculated, but the right was slightly over 1 without evidence of occlusive disease.    PHYSICAL EXAMINATION:   SKIN:   INSPECTION:  Left foot:  Indeed, the left first toe is swollen diffusely, although this does not appear to be an arterial issue.  The wound on the tip of his toe is a chronic area that we have not been able to get to heal, but it does not appear to be overtly infected.  He is tender over the DIP in this joint, not so much the PIP.  There is some erythema in the toe and it is very difficult to sort this out from cellulitis versus some form of migratory or acute arthritis.  At this point, I feel obligated to treat him for both.    ASSESSMENT/PLAN:                      Cellulitis of the toe, ?acute arthritis.  I am going to put him on antibiotics as well as an anti-inflammatory for five days.  He probably also has a Tinea infecton although I think this is inconsequential

## 2014-07-21 ENCOUNTER — Encounter: Payer: Self-pay | Admitting: Internal Medicine

## 2014-07-21 ENCOUNTER — Ambulatory Visit (HOSPITAL_COMMUNITY)
Admission: RE | Admit: 2014-07-21 | Discharge: 2014-07-21 | Disposition: A | Discharge status: Home / Self Care | Payer: Medicare Other | Source: Ambulatory Visit | Attending: Internal Medicine | Admitting: Internal Medicine

## 2014-07-21 ENCOUNTER — Non-Acute Institutional Stay (SKILLED_NURSING_FACILITY): Payer: Medicare Other | Admitting: Internal Medicine

## 2014-07-21 ENCOUNTER — Other Ambulatory Visit: Payer: Self-pay | Admitting: Internal Medicine

## 2014-07-21 DIAGNOSIS — S91109D Unspecified open wound of unspecified toe(s) without damage to nail, subsequent encounter: Secondary | ICD-10-CM

## 2014-07-21 DIAGNOSIS — M79605 Pain in left leg: Secondary | ICD-10-CM | POA: Insufficient documentation

## 2014-07-21 DIAGNOSIS — S72009D Fracture of unspecified part of neck of unspecified femur, subsequent encounter for closed fracture with routine healing: Secondary | ICD-10-CM | POA: Diagnosis not present

## 2014-07-21 DIAGNOSIS — L0390 Cellulitis, unspecified: Secondary | ICD-10-CM | POA: Insufficient documentation

## 2014-07-21 DIAGNOSIS — I509 Heart failure, unspecified: Secondary | ICD-10-CM | POA: Diagnosis not present

## 2014-07-21 DIAGNOSIS — Z87891 Personal history of nicotine dependence: Secondary | ICD-10-CM | POA: Diagnosis not present

## 2014-07-21 DIAGNOSIS — R609 Edema, unspecified: Secondary | ICD-10-CM

## 2014-07-21 DIAGNOSIS — N400 Enlarged prostate without lower urinary tract symptoms: Secondary | ICD-10-CM

## 2014-07-21 DIAGNOSIS — L03119 Cellulitis of unspecified part of limb: Secondary | ICD-10-CM | POA: Diagnosis not present

## 2014-07-21 NOTE — Progress Notes (Signed)
Patient ID: Patrick [name], male   DOB: Jun 15, 1913, 81 y.o.   MRN: [id number]         :         is a routine visit Level care skilled.  Facility [facility].   Chief complaint----medical management of chronic medical conditions including left hip fracture-CHF-restless leg-history tachycardia-history weight loss--depression and BPH Acute visit secondary to erythema left leg   .  History of present illness.   Patient is a very pleasant 81 year old male who was admitted to [facility] after sustaining a fall --he was diagnosed with a left femur neck fracture  This was surgically repaired with a hemi arthroplasty apparently he did quite well with this.  He was initially here for strengthening and physical therapy--but has become long-term-care.  His other diagnoses include BPH--he is on Flomax and restless legs which he is on Requip for  He also has a history of depression and is on Zoloft.  Apparently shortly after his admission here he did have hypoxia with O2 saturation in the mid 80s-chest x-ray showed moderate bilateral effusions with bilateral passive atelectasis--he is now on low-dose Lasix and this appears to have been stable.  His stay here also has been complicated by tachycardia originally he was on Lopressor but his blood pressure did not really tolerate this well past the Lopressor has been discontinued nonetheless pulse rate now appears to be controlled --I do not see any recent tachycardic or bradycardic readings He's also been treated for pneumonia and UTI during his stay here Fact Dr. Dellia Nims saw him earlier this week since patient apparently had spiked a temperature and appeared to not be feeling well-a urine culture was ordered which has come back showing greater than 100,000 colonies of gram-negative rods.  Most acute issue recently has been a somewhat chronic left great toe wound-this has been followed closely by wound care and Dr. Dellia Nims has been quite stable with  topical treatment occasional anabiotic.  However patient has developed what appears to be fairly significant erythema of his distal foot and shin area-he does not really complain of acute pain although there is some tenderness and swelling of the leg on the left.  His vital signs are stable he is afebrile-     Previous medical history.  History of left femur fracture status post repair.  BPH.  Restless legs.  Depression.  History of cervical DJD.  Osteoarthritis.  History of basal cell carcinoma.  Diverticulosis.  Hyperlipidemia.  Right inguinal hernia.  Ischemic heart disease.  Hiatal hernia.  History of kidney stones.  History of esophageal stricture and dilation in the past.  Recurrent TIAs.  Recurrent diverticulitis and perforation and exploratory laparotomy and sigmoid resection in June 2011.  GERD.  Hypertension  .  Social history-patient apparently lived in an assisted living facility--he has a supportive son who lives in [place].  Has a very distant history of tobacco use almost 50 years ago no history of alcohol abuse  .  Family history not pertinent.   Medication review per [facility]  Review of systems   In general denies any fever chills--  Skin --chronic left toe wound-again some increased erythema of his left leg  .  Head ears eyes nose mouth and throat-does not complaining of any sore throat  .  Respiratory no complaints of shortness of breath or cough.  Cardiac- does have a history of coronary artery disease but does not complain of any chest pain --has some left leg edema  GI-does  not complaining of abdominal pain nausea vomiting diarrhea constipation-- does have a history of an inguinal hernia.  GU-does not complaining of overt dysuria today  Muscle skeletal-says his hip pain joint pain is controlled .  Neurologic-does not complaining of any headache dizziness numbness-does have a history of restless leg.  Psych history of depression does not appear  to be depressed -- is in good spirits does not complain of depression or anxiety .  Physical exam.  Temperature 98.2 pulse 96 respirations 20 blood pressure 142/62-110/56 in this range per nursing his weight is stable and has been in the low 180s.    In general this is a frail but quite alert and cognitively intact elderly male.  The skin is warm and dry--  he does have some erythema of his distal left foot-there is an open area under his left great toe needed does not have active drainage at this time there is some tenderness to his left calf area here there is also some erythema of his lower left leg this is warm to touch.--This is approximately a third up his shin      Eyes pupils appear equal round react to light sclera and conjunctiva are clear he has prescription lenses visual acuity appears grossly intact.   Oropharynx is clear mucous membranes moist--.  Chest he has decreased breath sounds at the bases but no rhonchi rales or wheezes no labored breathing.  Heart is regular rate and rhythm without murmur gallop or rub he appears to have some increased edema of his left leg pedal pulses somewhat difficult to palpate because of edema   Abdomen is protuberant soft nontender he does have a well-healed surgical scar also note a right sided hernia--he does have bowel sounds in all 4 quadrants.   GU-the catheter draining amber colored urine  [place] skeletal  Moves all extremities x4 largely ambulates in a wheelchair strength appears to be intact all extremities there is no significant pain with extension or flexion of his left hip ---  Neurologic-is grossly intact no focal deficits his speech is clear no lateralizing findings.  Psych he is alert and oriented x3 pleasant and appropriate  .  Labs  05/29/2014.  Sodium 137 potassium 4 BUN 14 creatinine 1.6.  Albumen 3.2 AST 60 otherwise liver function tests within normal limits.  WBC 10.2 hemoglobin 11.8 platelets  158  05/29/2014  Sodium 137 potassium 4 BUN 14 creatinine 1.00.  AST 60 otherwise liver function tests within normal limits.  WBC 10.2 hemoglobin 11.8 platelets 158.    01/27/2014.  WBC 6.5 hemoglobin 11.4 platelets 178.  Sodium 142 potassium 4.2 BUN 13 creatinine 1.03  .  12/05/2013.  Sodium 141 potassium 4.7 BUN 14 creatinine 1.06.  WBC 7.8 hemoglobin 11.9 platelets 190.     11/18/2013.  WBC 6.1 hemoglobin 12.0 platelets 174.  Sodium 143 potassium 4.2 BUN 11 creatinine 0.9 albumin 3.0 otherwise liver function tests within normal limits 09/11/2013.  WBC 6.4 hemoglobin 12.9 platelets 162.  Sodium 141 potassium 4.8 BUN 15 creatinine 0.9  08/22/2013.  TSH 1.599  .  Assessment and plan.  #1-left hip fracture with repair-this appears to be stable   -#2- CHF-this appears to be stable he is on low-dose Lasix with potassium update metabolic panel .  Marland Kitchen  #3-history of restless legs this appears to be stable on Requip   #4-history of BPH-continues on Flomax apparently this has been stable  .  #5-history of GERD-with hiatal hernia-he is on Zantac this appears  to be relatively controlled  .  #6-history depression he is on Zoloft this appears to be quite stable   #7-history of tachycardia-as noted this is somewhat challenging with his history of hypotension-at this point rate appears to be controlled he is no longer on Lopressor --pulses appear to be controlled chart review recently in the 70's-90's  Blood pressure appears well controlled I Noted above .   #9-history of increase left leg edema-erythema of shin and distal foot-was history of left great toe wound-we have order a venous Doppler-also will update a CBC with differential-venous Dopplers in the past have been negative for DVT.  I did discuss this with Dr. Nyoka Cowden via phone will start him on Bactrim empirically for cellulitis for 10 days-.  This will have to be watched very closely--  CPT-99310-of note  greater than 35 minutes spent assessing patient-discussing his status with nursing staff-reviewing his chart-and coordinating and formulating a plan of care for numerous diagnoses-of note greater than 50% of time spent coordinating plan of care .    --  #9-history constipation-he complained of this previously but appears the Colace which was added is helping at this point monitor.   #12-anemia-this appears to be stable most recent hemoglobin 11.8  #13-neck rash-will treat with Nizoral cream twice a day-monitor for resolution.  #14 UTI-we do not have the specific organism yet-we will empirically start him on Cipro since apparently did have a fever and felt quite ill earlier this week-will await final culture results he appears to be doing well today however  #15-weight loss-this appears to have stabilized I do note he is on low-dose Remeron he actually appears to be doing quite well  CPT- 99310--of note greater than 35 minutes spent assessing patient discussing his status with nursing staff-addressing his concerns at bedside-reviewing his chart-and coordinating and formulating a plan of care for numerous diagnoses-of note greater than 50% of time spent coordinating plan of care  \

## 2014-07-22 ENCOUNTER — Ambulatory Visit (HOSPITAL_COMMUNITY)
Admission: RE | Admit: 2014-07-22 | Discharge: 2014-07-22 | Disposition: A | Discharge status: Home / Self Care | Payer: Medicare Other | Source: Ambulatory Visit | Attending: Internal Medicine | Admitting: Internal Medicine

## 2014-07-22 ENCOUNTER — Other Ambulatory Visit (HOSPITAL_COMMUNITY)
Admission: AD | Admit: 2014-07-22 | Discharge: 2014-07-22 | Disposition: A | Discharge status: Home / Self Care | Payer: Medicare Other | Source: Skilled Nursing Facility | Attending: Internal Medicine | Admitting: Internal Medicine

## 2014-07-22 ENCOUNTER — Other Ambulatory Visit: Payer: Self-pay | Admitting: Internal Medicine

## 2014-07-22 DIAGNOSIS — M7989 Other specified soft tissue disorders: Secondary | ICD-10-CM | POA: Diagnosis not present

## 2014-07-22 DIAGNOSIS — S91102A Unspecified open wound of left great toe without damage to nail, initial encounter: Secondary | ICD-10-CM | POA: Diagnosis not present

## 2014-07-22 DIAGNOSIS — M25562 Pain in left knee: Secondary | ICD-10-CM | POA: Diagnosis not present

## 2014-07-22 DIAGNOSIS — X58XXXA Exposure to other specified factors, initial encounter: Secondary | ICD-10-CM | POA: Insufficient documentation

## 2014-07-22 DIAGNOSIS — S91302A Unspecified open wound, left foot, initial encounter: Secondary | ICD-10-CM

## 2014-07-22 DIAGNOSIS — M79672 Pain in left foot: Secondary | ICD-10-CM | POA: Diagnosis not present

## 2014-07-22 DIAGNOSIS — R937 Abnormal findings on diagnostic imaging of other parts of musculoskeletal system: Secondary | ICD-10-CM | POA: Insufficient documentation

## 2014-07-22 LAB — CBC WITH DIFFERENTIAL/PLATELET
BASOS PCT: 0 % (ref 0–1)
Basophils Absolute: 0 10*3/uL (ref 0.0–0.1)
EOS PCT: 4 % (ref 0–5)
Eosinophils Absolute: 0.4 10*3/uL (ref 0.0–0.7)
HCT: 35.3 % — ABNORMAL LOW (ref 39.0–52.0)
HEMOGLOBIN: 11.4 g/dL — AB (ref 13.0–17.0)
Lymphocytes Relative: 19 % (ref 12–46)
Lymphs Abs: 1.7 10*3/uL (ref 0.7–4.0)
MCH: 28.6 pg (ref 26.0–34.0)
MCHC: 32.3 g/dL (ref 30.0–36.0)
MCV: 88.7 fL (ref 78.0–100.0)
MONO ABS: 1.5 10*3/uL — AB (ref 0.1–1.0)
MONOS PCT: 17 % — AB (ref 3–12)
NEUTROS ABS: 5.2 10*3/uL (ref 1.7–7.7)
Neutrophils Relative %: 59 % (ref 43–77)
Platelets: 214 10*3/uL (ref 150–400)
RBC: 3.98 MIL/uL — ABNORMAL LOW (ref 4.22–5.81)
RDW: 13.2 % (ref 11.5–15.5)
WBC: 8.8 10*3/uL (ref 4.0–10.5)

## 2014-07-22 LAB — COMPREHENSIVE METABOLIC PANEL
ALBUMIN: 2.9 g/dL — AB (ref 3.5–5.0)
ALT: 10 U/L — ABNORMAL LOW (ref 17–63)
AST: 13 U/L — AB (ref 15–41)
Alkaline Phosphatase: 92 U/L (ref 38–126)
Anion gap: 7 (ref 5–15)
BILIRUBIN TOTAL: 0.5 mg/dL (ref 0.3–1.2)
BUN: 9 mg/dL (ref 6–20)
CO2: 26 mmol/L (ref 22–32)
Calcium: 8 mg/dL — ABNORMAL LOW (ref 8.9–10.3)
Chloride: 99 mmol/L — ABNORMAL LOW (ref 101–111)
Creatinine, Ser: 0.99 mg/dL (ref 0.61–1.24)
GFR calc Af Amer: >60 mL/min (ref >60)
GFR calc non Af Amer: >60 mL/min (ref >60)
GLUCOSE: 101 mg/dL — AB (ref 65–99)
POTASSIUM: 3.9 mmol/L (ref 3.5–5.1)
SODIUM: 132 mmol/L — AB (ref 135–145)
Total Protein: 6.6 g/dL (ref 6.5–8.1)

## 2014-07-23 ENCOUNTER — Non-Acute Institutional Stay (SKILLED_NURSING_FACILITY): Payer: Medicare Other | Admitting: Internal Medicine

## 2014-07-23 ENCOUNTER — Ambulatory Visit (HOSPITAL_COMMUNITY)
Admission: RE | Admit: 2014-07-23 | Discharge: 2014-07-23 | Disposition: A | Discharge status: Home / Self Care | Payer: Medicare Other | Source: Ambulatory Visit | Attending: Internal Medicine | Admitting: Internal Medicine

## 2014-07-23 ENCOUNTER — Encounter: Payer: Self-pay | Admitting: Internal Medicine

## 2014-07-23 DIAGNOSIS — L03039 Cellulitis of unspecified toe: Secondary | ICD-10-CM

## 2014-07-23 DIAGNOSIS — M79605 Pain in left leg: Secondary | ICD-10-CM | POA: Diagnosis not present

## 2014-07-23 DIAGNOSIS — M869 Osteomyelitis, unspecified: Secondary | ICD-10-CM

## 2014-07-23 DIAGNOSIS — S91109D Unspecified open wound of unspecified toe(s) without damage to nail, subsequent encounter: Secondary | ICD-10-CM

## 2014-07-23 NOTE — Progress Notes (Signed)
Patient ID: Patrick [name], male   DOB: [birth year], 81 y.o.   MRN: [id number]       is an acute visit Level care skilled.  Facility [facility]   Chief complaint--- Acute visit secondary to left lower extremity infection-osteomyelitis   .  History of present illness.   Patient is a very pleasant 81 year old male      Most acute issue recently has been a somewhat chronic left great toe wound-this has been followed closely by wound care and Dr. Dellia Nims has been quite stable with topical treatment occasional anabiotic.  However patient has developed what appears to be fairly significant erythema of his distal foot and shin area-x-ray of the area was done which does show osteomyelitis in the distal aspect of the left great toe.  He has been started on Bactrim.  Blood work was obtained yesterday which shows the white count is 8.8-renal function appears stable with a BUN of 9 and creatinine 0.99.  Venous Doppler of the left leg was negative for DVT  He has some pain in this leg this does not appear to be increased from what I saw her earlier this week-erythema does persist       Previous medical history.  History of left femur fracture status post repair.  BPH.  Restless legs.  Depression.  History of cervical DJD.  Osteoarthritis.  History of basal cell carcinoma.  Diverticulosis.  Hyperlipidemia.  Right inguinal hernia.  Ischemic heart disease.  Hiatal hernia.  History of kidney stones.  History of esophageal stricture and dilation in the past.  Recurrent TIAs.  Recurrent diverticulitis and perforation and exploratory laparotomy and sigmoid resection in June 2011.  GERD.  Hypertension  .  Social history-patient apparently lived in an assisted living facility--he has a supportive son who lives in [place].  Has a very distant history of tobacco use almost 50 years ago no history of alcohol abuse  .  Family history not pertinent.   Medication review per  [facility]  Review of systems   In general denies any fever chills--  Skin --chronic left toe wound-again some increased erythema of his left leg--x-ray does show osteomyelitis  .  Head ears eyes nose mouth and throat-does not complaining of any sore throat  .  Respiratory no complaints of shortness of breath or cough.  Cardiac- does have a history of coronary artery disease but does not complain of any chest pain --has some left leg edema  GI-does not complaining of abdominal pain nausea vomiting diarrhea constipation-- does have a history of an inguinal hernia.  GU-does not complain of dysuria  Muscle skeletal-says his hip pain joint pain is controlled .  Neurologic-does not complaining of any headache dizziness numbness-does have a history of restless leg.  Psych history of depression does not appear to be depressed -- is in good spirits does not complain of depression or anxiety .  Physical exam.   He is afebrile pulse of 75 respirations 20 blood pressure 122/67.    In general this is a frail but quite alert and cognitively intact elderly male.  The skin is warm and dry--  he does have some erythema of his distal left foot-there is an open area under his left great toe needed does not have active drainage at this time there is some tenderness to his left calf area here there is also some erythema of his lower left leg this is warm to touch.--This is approximately a third up his shin--this appears to  be relatively baseline with what I saw 2 days ago.        Eyes pupils appear equal round react to light sclera and conjunctiva are clear he has prescription lenses visual acuity appears grossly intact.   Oropharynx is clear mucous membranes moist--.  Chest he has decreased breath sounds at the bases but no rhonchi rales or wheezes no labored breathing.  Heart is regular rate and rhythm without murmur gallop or rub he appears to have some increased edema of his left leg pedal pulses  somewhat difficult to palpate because of edema--this again is relatively unchanged from 2 days ago   Abdomen is protuberant soft nontender he does have a well-healed surgical scar also note a right sided hernia--he does have bowel sounds in all 4 quadrants.   GU-the catheter draining amber colored urine  Muscle- skeletal  Moves all extremities x4 largely ambulates in a wheelchair strength appears to be intact all extremities there is no significant pain with extension or flexion of his left hip ---  Neurologic-is grossly intact no focal deficits his speech is clear no lateralizing findings.  Psych he is alert and oriented x3 pleasant and appropriate  .  Labs  07/22/2014.  Sodium 132 potassium 3.9 BUN 9 creatinine 0.99-AST 13 albumen 2.9 ALT 10.  WBC 8.8 hemoglobin 11.4 platelets 214  05/29/2014.  Sodium 137 potassium 4 BUN 14 creatinine 1.6.  Albumen 3.2 AST 60 otherwise liver function tests within normal limits.  WBC 10.2 hemoglobin 11.8 platelets 158  05/29/2014  Sodium 137 potassium 4 BUN 14 creatinine 1.00.  AST 60 otherwise liver function tests within normal limits.  WBC 10.2 hemoglobin 11.8 platelets 158.    01/27/2014.  WBC 6.5 hemoglobin 11.4 platelets 178.  Sodium 142 potassium 4.2 BUN 13 creatinine 1.03  .  12/05/2013.  Sodium 141 potassium 4.7 BUN 14 creatinine 1.06.  WBC 7.8 hemoglobin 11.9 platelets 190.     11/18/2013.  WBC 6.1 hemoglobin 12.0 platelets 174.  Sodium 143 potassium 4.2 BUN 11 creatinine 0.9 albumin 3.0 otherwise liver function tests within normal limits 09/11/2013.  WBC 6.4 hemoglobin 12.9 platelets 162.  Sodium 141 potassium 4.8 BUN 15 creatinine 0.9  08/22/2013.  TSH 1.599  .  Assessment and plan.   #1-left leg cellulitis-left great toe osteomyelitis-challenging situation-this was discussed extensively with Dr. Nyoka Cowden via phone-at this point will continue Bactrim and monitor very closely for any progression here-also if  there is any drainage culture this.  .  Also will order an orthopedic consult as soon as possible.  Also will update a CBC with differential and  Metabolic panel-if white count progresses or clinically erythema progresses suspect we will change the antibiotic-   [id number]- of note greater than 35 minutes spent assessing patientreviewing studies...-discussing his status with nursing staff-as well as extensively with Dr. Nyoka Cowden via phone-and coordinating plan of care.  Of note greater than 50% of time spent coordinating plan of care as noted-  .      Marland Kitchen        \

## 2014-07-24 ENCOUNTER — Encounter (HOSPITAL_COMMUNITY): Payer: Self-pay | Admitting: *Deleted

## 2014-07-24 ENCOUNTER — Other Ambulatory Visit: Payer: Self-pay

## 2014-07-24 ENCOUNTER — Telehealth: Payer: Self-pay | Admitting: Orthopedic Surgery

## 2014-07-24 ENCOUNTER — Inpatient Hospital Stay
Admission: RE | Admit: 2014-07-24 | Discharge: 2014-12-05 | Disposition: A | Discharge status: Nursing Home (Includes ICF) | Payer: BLUE CROSS/BLUE SHIELD | Source: Ambulatory Visit | Attending: Internal Medicine | Admitting: Internal Medicine

## 2014-07-24 ENCOUNTER — Ambulatory Visit (HOSPITAL_COMMUNITY)
Admit: 2014-07-24 | Discharge: 2014-07-24 | Disposition: A | Discharge status: Home / Self Care | Payer: Medicare Other | Source: Ambulatory Visit | Attending: Internal Medicine | Admitting: Internal Medicine

## 2014-07-24 DIAGNOSIS — R0989 Other specified symptoms and signs involving the circulatory and respiratory systems: Principal | ICD-10-CM

## 2014-07-24 DIAGNOSIS — M869 Osteomyelitis, unspecified: Secondary | ICD-10-CM | POA: Diagnosis not present

## 2014-07-24 LAB — BASIC METABOLIC PANEL
Anion gap: 16 — ABNORMAL HIGH (ref 5–15)
BUN: 9 mg/dL (ref 6–20)
CALCIUM: 8.5 mg/dL — AB (ref 8.9–10.3)
CO2: 20 mmol/L — AB (ref 22–32)
Chloride: 98 mmol/L — ABNORMAL LOW (ref 101–111)
Creatinine, Ser: 1.43 mg/dL — ABNORMAL HIGH (ref 0.61–1.24)
GFR calc Af Amer: 45 mL/min — ABNORMAL LOW (ref >60)
GFR calc non Af Amer: 39 mL/min — ABNORMAL LOW (ref >60)
GLUCOSE: 92 mg/dL (ref 65–99)
Potassium: 4.4 mmol/L (ref 3.5–5.1)
SODIUM: 134 mmol/L — AB (ref 135–145)

## 2014-07-24 LAB — CBC WITH DIFFERENTIAL/PLATELET
BASOS ABS: 0 10*3/uL (ref 0.0–0.1)
BASOS PCT: 0 % (ref 0–1)
EOS ABS: 0.1 10*3/uL (ref 0.0–0.7)
Eosinophils Relative: 1 % (ref 0–5)
HCT: 36.6 % — ABNORMAL LOW (ref 39.0–52.0)
Hemoglobin: 11.8 g/dL — ABNORMAL LOW (ref 13.0–17.0)
LYMPHS PCT: 14 % (ref 12–46)
Lymphs Abs: 1.7 10*3/uL (ref 0.7–4.0)
MCH: 28.4 pg (ref 26.0–34.0)
MCHC: 32.2 g/dL (ref 30.0–36.0)
MCV: 88 fL (ref 78.0–100.0)
Monocytes Absolute: 1.4 10*3/uL — ABNORMAL HIGH (ref 0.1–1.0)
Monocytes Relative: 11 % (ref 3–12)
NEUTROS ABS: 9 10*3/uL — AB (ref 1.7–7.7)
Neutrophils Relative %: 74 % (ref 43–77)
PLATELETS: 259 10*3/uL (ref 150–400)
RBC: 4.16 MIL/uL — ABNORMAL LOW (ref 4.22–5.81)
RDW: 13.6 % (ref 11.5–15.5)
WBC: 12.3 10*3/uL — ABNORMAL HIGH (ref 4.0–10.5)

## 2014-07-24 MED ORDER — SODIUM CHLORIDE 0.9 % IJ SOLN: 10.0000 mL | Freq: Two times a day (BID) | INTRAMUSCULAR | Status: DC

## 2014-07-24 MED ORDER — HYDROCODONE-ACETAMINOPHEN 5-325 MG PO TABS: ORAL_TABLET | ORAL | Status: DC

## 2014-07-24 MED ORDER — SODIUM CHLORIDE 0.9 % IJ SOLN: 10.0000 mL | INTRAMUSCULAR | Status: DC | PRN

## 2014-07-24 NOTE — Telephone Encounter (Signed)
Noted  

## 2014-07-24 NOTE — Progress Notes (Signed)
Peripherally Inserted Central Catheter/Midline Placement  The IV Nurse has discussed with the patient and/or persons authorized to consent for the patient, the purpose of this procedure and the potential benefits and risks involved with this procedure.  The benefits include less needle sticks, lab draws from the catheter and patient may be discharged home with the catheter.  Risks include, but not limited to, infection, bleeding, blood clot (thrombus formation), and puncture of an artery; nerve damage and irregular heat beat.  Alternatives to this procedure were also discussed.  PICC/Midline Placement Documentation  PICC / Midline Single Lumen 81/84/03 PICC Right Basilic 40 cm 1 cm (Active)  Indication for Insertion or Continuance of Line Home intravenous therapies (PICC only) 07/24/2014  3:43 PM  Exposed Catheter (cm) 1 cm 07/24/2014  3:43 PM  Site Assessment Clean;Dry;Intact 07/24/2014  3:43 PM  Line Status Flushed;Saline locked;Blood return noted 07/24/2014  3:43 PM  Dressing Type Transparent;Securing device 07/24/2014  3:43 PM  Dressing Status Clean;Dry;Intact 07/24/2014  3:43 PM  Line Care Connections checked and tightened 07/24/2014  3:43 PM  Dressing Intervention New dressing 07/24/2014  3:43 PM  Dressing Change Due 07/31/14 07/24/2014  3:43 PM       Patrick [name] 07/24/2014, 3:44 PM

## 2014-07-24 NOTE — Discharge Instructions (Signed)
PICC Insertion, Care After  Refer to this sheet in the next few weeks. These instructions provide you with information on caring for yourself after your procedure. Your health care provider may also give you more specific instructions. Your treatment has been planned according to current medical practices, but problems sometimes occur. Call your health care provider if you have any problems or questions after your procedure.  WHAT TO EXPECT AFTER THE PROCEDURE  After your procedure, it is typical to have the following:   Mild discomfort at the insertion site. This should not last more than a day.  HOME CARE INSTRUCTIONS   Rest at home for the remainder of the day after the procedure.   You may bend your arm and move it freely. If your PICC is near or at the bend of your elbow, avoid activity with repeated motion at the elbow.   Avoid lifting heavy objects as instructed by your health care provider.   Avoid using a crutch with the arm on the same side as your PICC. You may need to use a walker.  Bandage Care   Keep your PICC bandage (dressing) clean and dry to prevent infection.   Ask your health care provider when you may shower. To keep the dressing dry, cover the PICC with plastic wrap and tape before showering. If the dressing does become wet, replace it right after the shower.   Do not soak in the bath, swim, or use hot tubs when you have a PICC.   Change the PICC dressing as instructed by your health care provider.   Change your PICC dressing if it becomes loose or wet.  General PICC Care   Check the PICC insertion site daily for leakage, redness, swelling, or pain.   Flush the PICC as directed by your health care provider. Let your health care provider know right away if the PICC is difficult to flush or does not flush. Do not use force to flush the PICC.   Do not use a syringe that is less than 10 mL to flush the PICC.   Never pull or tug on the PICC.   Avoid blood pressure checks on the arm  with the PICC.   Keep your PICC identification card with you at all times.   Do not take the PICC out yourself. Only a trained health care professional should remove the PICC.  SEEK MEDICAL CARE IF:   You have pain in your arm, ear, face, or teeth.   You have fever or chills.   You have drainage from the PICC insertion site.   You have redness or palpate a "cord" around the PICC insertion site.   You cannot flush the catheter.  SEEK IMMEDIATE MEDICAL CARE IF:   You have swelling in the arm in which the PICC is inserted.  Document Released: 11/21/2012 Document Revised: 02/05/2013 Document Reviewed: 11/21/2012  Exit[employer] Patient Information 2015 [employer]. This information is not intended to replace advice given to you by your health care provider. Make sure you discuss any questions you have with your health care provider.

## 2014-07-24 NOTE — Telephone Encounter (Signed)
i dont see those try podiatry of foot specialist

## 2014-07-24 NOTE — Telephone Encounter (Signed)
Dr. Aline Brochure could you please take a look at this [facility] chart, the [facility] is calling asking for an office visit for him asap for osteomyelitis of the left great toe, xrays were at [facility] on 06/07, Toya the nurse states that its looking pretty bad, please advise if we can schedule this appointment?

## 2014-07-24 NOTE — Telephone Encounter (Signed)
RX faxed to [facility] @ [phone]. Phone number [phone]  

## 2014-07-25 ENCOUNTER — Encounter: Payer: Self-pay | Admitting: Internal Medicine

## 2014-07-25 ENCOUNTER — Encounter (HOSPITAL_COMMUNITY)
Admission: AD | Admit: 2014-07-25 | Discharge: 2014-07-25 | Disposition: A | Discharge status: Home / Self Care | Payer: Medicare Other | Source: Skilled Nursing Facility | Attending: Internal Medicine | Admitting: Internal Medicine

## 2014-07-25 ENCOUNTER — Non-Acute Institutional Stay (SKILLED_NURSING_FACILITY): Payer: Medicare Other | Admitting: Internal Medicine

## 2014-07-25 DIAGNOSIS — N289 Disorder of kidney and ureter, unspecified: Secondary | ICD-10-CM

## 2014-07-25 DIAGNOSIS — D72829 Elevated white blood cell count, unspecified: Secondary | ICD-10-CM | POA: Diagnosis not present

## 2014-07-25 DIAGNOSIS — M869 Osteomyelitis, unspecified: Secondary | ICD-10-CM | POA: Diagnosis not present

## 2014-07-25 DIAGNOSIS — E785 Hyperlipidemia, unspecified: Secondary | ICD-10-CM | POA: Insufficient documentation

## 2014-07-25 DIAGNOSIS — I10 Essential (primary) hypertension: Secondary | ICD-10-CM | POA: Insufficient documentation

## 2014-07-25 DIAGNOSIS — I259 Chronic ischemic heart disease, unspecified: Secondary | ICD-10-CM | POA: Insufficient documentation

## 2014-07-25 DIAGNOSIS — N400 Enlarged prostate without lower urinary tract symptoms: Secondary | ICD-10-CM | POA: Insufficient documentation

## 2014-07-25 NOTE — Progress Notes (Signed)
Patient ID: CLEVLAND CORK, male   DOB: [birth year], 81 y.o.   MRN: [id number]        is an acute visit Level care skilled.  Facility [facility].   Chief complaint--- Acute visit follow-up left lower extremity cellulitis-left great toe osteomyelitis--renal insufficiency   .  History of present illness.   Patient is a very pleasant 81 year old male      Most acute issue recently has been a somewhat chronic left great toe wound-this has been followed closely by wound care and Dr. Dellia Nims has been quite stable with topical treatment occasional antibiotic.  However patient has developed what appears to be fairly significant erythema of his distal foot and shin area-x-ray of the area was done which does show osteomyelitis in the distal aspect of the left great toe.  He was started on Bactrim empirically this week-this was switched to vancomycin IV as well as Cipro yesterday by Dr. Dellia Nims   Blood work yesterday was concerning for a white count that is going up at 12.3.  He continues to be afebrile and clinically appears to be stable    He has some pain in this leg this does not appear to be increased from what I saw her earlier this week-erythema does persist--but appears to receded a bit from yesterday per the markings done by nursing on the area of erythema  I also note on lab yesterday creatinine has risen somewhat to 1.43 his baseline creatinine appears to be around 1-I do note he is on Lasix---apparently he is eating and drinking fairly well       Previous medical history.  History of left femur fracture status post repair.  BPH.  Restless legs.  Depression.  History of cervical DJD.  Osteoarthritis.  History of basal cell carcinoma.  Diverticulosis.  Hyperlipidemia.  Right inguinal hernia.  Ischemic heart disease.  Hiatal hernia.  History of kidney stones.  History of esophageal stricture and dilation in the past.  Recurrent TIAs.  Recurrent diverticulitis and  perforation and exploratory laparotomy and sigmoid resection in June 2011.  GERD.  Hypertension  .  Social history-patient apparently lived in an assisted living facility--he has a supportive son who lives in [place].  Has a very distant history of tobacco use almost 50 years ago no history of alcohol abuse  .  Family history not pertinent.   Medication review per [facility]  Review of systems   In general denies any fever chills--  Skin --chronic left toe wound-again some  erythema of his left leg--x-ray does show osteomyelitis  .  Head ears eyes nose mouth and throat-does not complaining of any sore throat  .  Respiratory no complaints of shortness of breath or cough.  Cardiac- does have a history of coronary artery disease but does not complain of any chest pain --has some left leg edema  GI-does not complaining of abdominal pain nausea vomiting diarrhea constipation-- does have a history of an inguinal hernia.  GU-does not complain of dysuria  Muscle skeletal-says his hip pain joint pain is controlled .  Neurologic-does not complaining of any headache dizziness numbness-does have a history of restless leg.  Psych history of depression does not appear to be depressed -- is in good spirits does not complain of depression or anxiety .  Physical exam.   Temperature 98.5 pulse 70 respirations 20 blood pressure 142/69-101/48  In d this range    In general this is a frail but quite alert and cognitively intact elderly male.  The skin is warm and dry--  he does have some erythema of his distal left foot-this extends up to his lower shin area however it appears this is slightly less than it was yesterday per the markings of the erythema done by nursing yesterday graft this erythema is somewhat warm to touch slightly tender  Distal  left foot is currently covered with dressing this is followed by wound care .        Eyes pupils appear equal round react to light sclera and  conjunctiva are clear he has prescription lenses visual acuity appears grossly intact.   Oropharynx is clear mucous membranes moist--.  Chest he has decreased breath sounds at the bases but no rhonchi rales or wheezes no labored breathing.  Heart is regular rate and rhythm without murmur gallop or rub he appears to have some increased edema of his left leg pedal pulses somewhat difficult to palpate because of edema--this again is relatively unchanged from 2 days ago   Abdomen is protuberant soft nontender he does have a well-healed surgical scar also note a right sided hernia--he does have bowel sounds in all 4 quadrants.   GU-the catheter draining amber colored urine  Muscle- skeletal  Moves all extremities x4 largely ambulates in a wheelchair strength appears to be intact all extremities there is no significant pain with extension or flexion of his left hip ---  Neurologic-is grossly intact no focal deficits his speech is clear no lateralizing findings.  Psych he is alert and oriented x3 pleasant and appropriate  .  Labs  07/24/2014.  WBC 12.3 hemoglobin 11.8 platelets 259.  Sodium 134 potassium 4.4 BUN 9 creatinine 1.43.    07/22/2014.  Sodium 132 potassium 3.9 BUN 9 creatinine 0.99-AST 13 albumen 2.9 ALT 10.  WBC 8.8 hemoglobin 11.4 platelets 214  05/29/2014.  Sodium 137 potassium 4 BUN 14 creatinine 1.6.  Albumen 3.2 AST 60 otherwise liver function tests within normal limits.  WBC 10.2 hemoglobin 11.8 platelets 158  05/29/2014  Sodium 137 potassium 4 BUN 14 creatinine 1.00.  AST 60 otherwise liver function tests within normal limits.  WBC 10.2 hemoglobin 11.8 platelets 158.    01/27/2014.  WBC 6.5 hemoglobin 11.4 platelets 178.  Sodium 142 potassium 4.2 BUN 13 creatinine 1.03  .  12/05/2013.  Sodium 141 potassium 4.7 BUN 14 creatinine 1.06.  WBC 7.8 hemoglobin 11.9 platelets 190.     11/18/2013.  WBC 6.1 hemoglobin 12.0 platelets 174.  Sodium  143 potassium 4.2 BUN 11 creatinine 0.9 albumin 3.0 otherwise liver function tests within normal limits 09/11/2013.  WBC 6.4 hemoglobin 12.9 platelets 162.  Sodium 141 potassium 4.8 BUN 15 creatinine 0.9  08/22/2013.  TSH 1.599  .  Assessment and plan.   #1-left leg cellulitis-left great toe osteomyelitis--he is now on vancomycin IV as well as ciprofloxacin--this will have to be watched closely however-white count is up this will need to be rechecked tomorrow..  He does not appear to be septic certainly but will have to be watched.  --He Arthur Holms appears to be stable.  #2-renal insufficiency again creatinine is going up on lab yesterday 1.43-will hold Lasix for now and recheck a metabolic panel tomorrow   [phone]   -  .      Marland Kitchen        \

## 2014-07-26 LAB — BASIC METABOLIC PANEL
Anion gap: 10 (ref 5–15)
BUN: 8 mg/dL (ref 6–20)
CO2: 24 mmol/L (ref 22–32)
Calcium: 8.3 mg/dL — ABNORMAL LOW (ref 8.9–10.3)
Chloride: 101 mmol/L (ref 101–111)
Creatinine, Ser: 1.18 mg/dL (ref 0.61–1.24)
GFR calc Af Amer: 57 mL/min — ABNORMAL LOW (ref >60)
GFR calc non Af Amer: 49 mL/min — ABNORMAL LOW (ref >60)
GLUCOSE: 88 mg/dL (ref 65–99)
Potassium: 4.1 mmol/L (ref 3.5–5.1)
SODIUM: 135 mmol/L (ref 135–145)

## 2014-07-26 LAB — CBC WITH DIFFERENTIAL/PLATELET
Basophils Absolute: 0 10*3/uL (ref 0.0–0.1)
Basophils Relative: 0 % (ref 0–1)
EOS ABS: 0.5 10*3/uL (ref 0.0–0.7)
EOS PCT: 6 % — AB (ref 0–5)
HEMATOCRIT: 33.6 % — AB (ref 39.0–52.0)
HEMOGLOBIN: 10.8 g/dL — AB (ref 13.0–17.0)
Lymphocytes Relative: 22 % (ref 12–46)
Lymphs Abs: 1.6 10*3/uL (ref 0.7–4.0)
MCH: 28.1 pg (ref 26.0–34.0)
MCHC: 32.1 g/dL (ref 30.0–36.0)
MCV: 87.5 fL (ref 78.0–100.0)
MONOS PCT: 15 % — AB (ref 3–12)
Monocytes Absolute: 1.1 10*3/uL — ABNORMAL HIGH (ref 0.1–1.0)
NEUTROS ABS: 4.2 10*3/uL (ref 1.7–7.7)
NEUTROS PCT: 57 % (ref 43–77)
Platelets: 211 10*3/uL (ref 150–400)
RBC: 3.84 MIL/uL — ABNORMAL LOW (ref 4.22–5.81)
RDW: 13.7 % (ref 11.5–15.5)
WBC: 7.3 10*3/uL (ref 4.0–10.5)

## 2014-07-28 ENCOUNTER — Non-Acute Institutional Stay (SKILLED_NURSING_FACILITY): Payer: Medicare Other | Admitting: Internal Medicine

## 2014-07-28 DIAGNOSIS — M86172 Other acute osteomyelitis, left ankle and foot: Secondary | ICD-10-CM

## 2014-07-28 DIAGNOSIS — L03116 Cellulitis of left lower limb: Secondary | ICD-10-CM | POA: Diagnosis not present

## 2014-07-28 DIAGNOSIS — B9562 Methicillin resistant Staphylococcus aureus infection as the cause of diseases classified elsewhere: Secondary | ICD-10-CM

## 2014-07-28 DIAGNOSIS — S91109D Unspecified open wound of unspecified toe(s) without damage to nail, subsequent encounter: Secondary | ICD-10-CM | POA: Diagnosis not present

## 2014-07-28 LAB — BASIC METABOLIC PANEL
Anion gap: 8 (ref 5–15)
BUN: 7 mg/dL (ref 6–20)
CO2: 26 mmol/L (ref 22–32)
CREATININE: 1.15 mg/dL (ref 0.61–1.24)
Calcium: 8 mg/dL — ABNORMAL LOW (ref 8.9–10.3)
Chloride: 102 mmol/L (ref 101–111)
GFR calc Af Amer: 59 mL/min — ABNORMAL LOW (ref >60)
GFR calc non Af Amer: 51 mL/min — ABNORMAL LOW (ref >60)
Glucose, Bld: 108 mg/dL — ABNORMAL HIGH (ref 65–99)
POTASSIUM: 4.3 mmol/L (ref 3.5–5.1)
SODIUM: 136 mmol/L (ref 135–145)

## 2014-07-28 LAB — VANCOMYCIN, TROUGH: Vancomycin Tr: 8 ug/mL — ABNORMAL LOW (ref 10.0–20.0)

## 2014-07-29 NOTE — Progress Notes (Addendum)
Patient ID: Patrick [name], male   DOB: 1913-10-04, 81 y.o.   MRN: [id number]                PROGRESS NOTE  DATE:  07/28/2014          FACILITY: [facility]    LEVEL OF CARE:   SNF   Acute Visit                      HISTORY OF PRESENT ILLNESS:  This is a now 81 year-old man whom I saw on 07/07/2014.  He had a swollen first toe.  There is an ulcer on the tip of the toe that has been a chronic wound.  I was not really sure whether this was cellulitis of the toe or an acute arthritis.  I put him on an anti-inflammatory and antibiotics.    Apparently, things did not improve.  The toe became intensely red and painful and the erythema extended up the dorsum of his foot up into his leg.  I was sent a picture of this last week while I was on vacation.  I recommended IV vancomycin and ciprofloxacin.  An x-ray of the foot showed an erosive process involving the tuft to the distal phalanx, compatible with acute osteomyelitis.  I actually had suggested that the patient go to the hospital, but he elected to stay here.  He is currently on vancomycin and ciprofloxacin.    Arterial dopplers were negative.  I believe he also had another venous Doppler of the leg due to the swelling, which was negative as well.      According to the nursing staff, the erythema and the pain are much better.  The patient states also that his pain is much better.    The patient is largely nonambulatory, but he uses his feet to push himself around in his wheelchair.  I think this is the issue with the chronic wound on the plantar aspect of his toe.    PHYSICAL EXAMINATION:   GENERAL APPEARANCE:  The patient is not systemically ill.       CHEST/RESPIRATORY:  Clear air entry bilaterally.    CARDIOVASCULAR:   CARDIAC:  Heart sounds are normal.  There are no murmurs.    GASTROINTESTINAL:   HERNIA:  Large abdominal hernia is noted.  This is chronic.   This is nontender.   SKIN:   INSPECTION:  Left foot:  There is  still swelling and erythema of the toe.  The erythema is spreading up the dorsal part of his foot and the anterior part of the leg.   There is considerable swelling here, although some of this is chronic.      ASSESSMENT/PLAN:        Acute osteomyelitis with extending cellulitis into his dorsal lower leg.  A culture of the toe wound has grown Staph aureus, although I do not yet have sensitivities.  He ultimately refused hosptialization   Lower extremity edema.  He has had arterial and venous dopplers before, which were unhelpful.  I think a lot of this is secondary to the acute infection.    Chronic wound on the tip of his toe.  We will need to find a way to offload this further.

## 2014-07-30 ENCOUNTER — Non-Acute Institutional Stay (SKILLED_NURSING_FACILITY): Payer: Medicare Other | Admitting: Internal Medicine

## 2014-07-30 DIAGNOSIS — M86172 Other acute osteomyelitis, left ankle and foot: Secondary | ICD-10-CM | POA: Diagnosis not present

## 2014-07-30 DIAGNOSIS — L03116 Cellulitis of left lower limb: Secondary | ICD-10-CM | POA: Diagnosis not present

## 2014-07-30 DIAGNOSIS — R279 Unspecified lack of coordination: Secondary | ICD-10-CM | POA: Diagnosis not present

## 2014-07-30 DIAGNOSIS — M869 Osteomyelitis, unspecified: Secondary | ICD-10-CM | POA: Diagnosis not present

## 2014-07-30 DIAGNOSIS — S91109D Unspecified open wound of unspecified toe(s) without damage to nail, subsequent encounter: Secondary | ICD-10-CM | POA: Diagnosis not present

## 2014-07-30 DIAGNOSIS — B9562 Methicillin resistant Staphylococcus aureus infection as the cause of diseases classified elsewhere: Secondary | ICD-10-CM | POA: Diagnosis not present

## 2014-07-30 LAB — WOUND CULTURE: Gram Stain: NONE SEEN

## 2014-07-31 DIAGNOSIS — M869 Osteomyelitis, unspecified: Secondary | ICD-10-CM | POA: Diagnosis not present

## 2014-07-31 DIAGNOSIS — S91109D Unspecified open wound of unspecified toe(s) without damage to nail, subsequent encounter: Secondary | ICD-10-CM | POA: Diagnosis not present

## 2014-07-31 DIAGNOSIS — R279 Unspecified lack of coordination: Secondary | ICD-10-CM | POA: Diagnosis not present

## 2014-07-31 LAB — VANCOMYCIN, TROUGH: VANCOMYCIN TR: 15 ug/mL (ref 10.0–20.0)

## 2014-07-31 LAB — BASIC METABOLIC PANEL
Anion gap: 6 (ref 5–15)
BUN: 7 mg/dL (ref 6–20)
CALCIUM: 8.1 mg/dL — AB (ref 8.9–10.3)
CO2: 26 mmol/L (ref 22–32)
Chloride: 103 mmol/L (ref 101–111)
Creatinine, Ser: 0.96 mg/dL (ref 0.61–1.24)
Glucose, Bld: 107 mg/dL — ABNORMAL HIGH (ref 65–99)
Potassium: 4 mmol/L (ref 3.5–5.1)
SODIUM: 135 mmol/L (ref 135–145)

## 2014-08-01 ENCOUNTER — Encounter (HOSPITAL_COMMUNITY)
Admission: AD | Admit: 2014-08-01 | Discharge: 2014-08-01 | Disposition: A | Discharge status: Home / Self Care | Payer: Medicare Other | Source: Skilled Nursing Facility | Attending: Internal Medicine | Admitting: Internal Medicine

## 2014-08-01 DIAGNOSIS — I259 Chronic ischemic heart disease, unspecified: Secondary | ICD-10-CM | POA: Diagnosis not present

## 2014-08-01 DIAGNOSIS — N400 Enlarged prostate without lower urinary tract symptoms: Secondary | ICD-10-CM | POA: Diagnosis not present

## 2014-08-01 DIAGNOSIS — E785 Hyperlipidemia, unspecified: Secondary | ICD-10-CM | POA: Diagnosis not present

## 2014-08-01 DIAGNOSIS — I10 Essential (primary) hypertension: Secondary | ICD-10-CM | POA: Diagnosis not present

## 2014-08-01 DIAGNOSIS — S91109D Unspecified open wound of unspecified toe(s) without damage to nail, subsequent encounter: Secondary | ICD-10-CM | POA: Diagnosis not present

## 2014-08-01 DIAGNOSIS — M869 Osteomyelitis, unspecified: Secondary | ICD-10-CM | POA: Diagnosis not present

## 2014-08-01 DIAGNOSIS — R279 Unspecified lack of coordination: Secondary | ICD-10-CM | POA: Diagnosis not present

## 2014-08-01 LAB — BASIC METABOLIC PANEL
ANION GAP: 10 (ref 5–15)
BUN: 7 mg/dL (ref 6–20)
CO2: 26 mmol/L (ref 22–32)
CREATININE: 0.97 mg/dL (ref 0.61–1.24)
Calcium: 8.2 mg/dL — ABNORMAL LOW (ref 8.9–10.3)
Chloride: 102 mmol/L (ref 101–111)
GFR calc non Af Amer: >60 mL/min (ref >60)
Glucose, Bld: 94 mg/dL (ref 65–99)
POTASSIUM: 3.8 mmol/L (ref 3.5–5.1)
Sodium: 138 mmol/L (ref 135–145)

## 2014-08-01 LAB — VANCOMYCIN, TROUGH: Vancomycin Tr: 15 ug/mL (ref 10.0–20.0)

## 2014-08-02 NOTE — Progress Notes (Addendum)
Patient ID: Patrick [name], male   DOB: February 28, 1913, 81 y.o.   MRN: [id number]                PROGRESS NOTE  DATE:  07/30/2014             FACILITY: [facility]                        LEVEL OF CARE:   SNF   Acute Visit                        CHIEF COMPLAINT:  Follow up osteomyelitis in the left great toe with extensive cellulitis in the foot and ankle.    HISTORY OF PRESENT ILLNESS:  I am following Patrick [name] today from my visit from two days ago.  Since then, his culture has come back showing abundant methicillin-resistant Staph aureus.  X-ray had suggested osteomyelitis.  In spite of this, the clinical situation of his left great toe, foot and leg is considerably better than last week, per all the staff whom I have talked to.  I was on vacation last week and actually did not see this during its most acute phase.    The patient has hammertoes and has had a chronic wound on the tip of the left great toe.  We are going to have to do something about this given the underlying infection here.    He also has chronic edema in the left leg below the knee.  This is worse than the right.  He has had three duplex ultrasounds since October 2015.   None of them showed DVTs.  A previous arterial study in bilateral legs did not show significant PAD.  In the left leg, ABI was non-compressible.  However, Doppler waveforms were biphasic.    REVIEW OF SYSTEMS:    MUSCULOSKELETAL:  Left leg:  The patient states the pain is considerably better.  This seems well verified by the staff.   CHEST/RESPIRATORY:  No shortness of breath.   CARDIAC:  No complaints of chest pain or shortness of breath.   GI:  No diarrhea.      PHYSICAL EXAMINATION:   GENERAL APPEARANCE:  The patient appears systemically well.  He is not febrile.   CHEST/RESPIRATORY:  Clear air entry bilaterally.    CARDIOVASCULAR:   CARDIAC:  Heart sounds are normal.  There are no murmurs.    GASTROINTESTINAL:   ABDOMEN:  No  tenderness.  No masses.   LIVER/SPLEEN/KIDNEYS:  No liver, no spleen.   MUSCULOSKELETAL:   EXTREMITIES:   BILATERAL LOWER EXTREMITIES:   Left leg:  The wound on his toe continues to look reasonably benign, although it is clearly the source of an extensive infection.  The edema of his toe is still quite extensive, but improved.  The erythema and swelling in the dorsal aspect of his foot up the ankle continues to gradually improve.  There continues to be swelling of the left leg.  I think this is chronic and may be multifactorial.   It does not appear to extend above the knee.  I think this may be a combination of venous insufficiency, contractures.  There is some swelling on the right leg, as well.  No signs of heart failure or renal failure.    ASSESSMENT/PLAN: osteomyelitis acute with co-existent cellulitis    The vancomycin is going to need to continue for 4-6 weeks.  I  think the ciprofloxacin can stop.  These were the antibiotics that I empirically chose over the phone based on his osteomyelitis and spreading infection.  The patient denied hospitalization, ultimately.        CPT CODE: 92230

## 2014-08-04 ENCOUNTER — Non-Acute Institutional Stay (SKILLED_NURSING_FACILITY): Payer: Medicare Other | Admitting: Internal Medicine

## 2014-08-04 DIAGNOSIS — L03116 Cellulitis of left lower limb: Secondary | ICD-10-CM

## 2014-08-04 DIAGNOSIS — B9562 Methicillin resistant Staphylococcus aureus infection as the cause of diseases classified elsewhere: Secondary | ICD-10-CM

## 2014-08-04 DIAGNOSIS — M86172 Other acute osteomyelitis, left ankle and foot: Secondary | ICD-10-CM | POA: Diagnosis not present

## 2014-08-04 DIAGNOSIS — R279 Unspecified lack of coordination: Secondary | ICD-10-CM | POA: Diagnosis not present

## 2014-08-04 DIAGNOSIS — S91109D Unspecified open wound of unspecified toe(s) without damage to nail, subsequent encounter: Secondary | ICD-10-CM | POA: Diagnosis not present

## 2014-08-04 DIAGNOSIS — M869 Osteomyelitis, unspecified: Secondary | ICD-10-CM | POA: Diagnosis not present

## 2014-08-05 DIAGNOSIS — I10 Essential (primary) hypertension: Secondary | ICD-10-CM | POA: Diagnosis not present

## 2014-08-05 DIAGNOSIS — E785 Hyperlipidemia, unspecified: Secondary | ICD-10-CM | POA: Diagnosis not present

## 2014-08-05 DIAGNOSIS — N400 Enlarged prostate without lower urinary tract symptoms: Secondary | ICD-10-CM | POA: Diagnosis not present

## 2014-08-05 DIAGNOSIS — I259 Chronic ischemic heart disease, unspecified: Secondary | ICD-10-CM | POA: Diagnosis not present

## 2014-08-05 LAB — VANCOMYCIN, TROUGH: VANCOMYCIN TR: 17 ug/mL (ref 10.0–20.0)

## 2014-08-06 ENCOUNTER — Non-Acute Institutional Stay (SKILLED_NURSING_FACILITY): Payer: Medicare Other | Admitting: Internal Medicine

## 2014-08-06 DIAGNOSIS — B9562 Methicillin resistant Staphylococcus aureus infection as the cause of diseases classified elsewhere: Secondary | ICD-10-CM

## 2014-08-06 DIAGNOSIS — S91109D Unspecified open wound of unspecified toe(s) without damage to nail, subsequent encounter: Secondary | ICD-10-CM | POA: Diagnosis not present

## 2014-08-06 DIAGNOSIS — R279 Unspecified lack of coordination: Secondary | ICD-10-CM | POA: Diagnosis not present

## 2014-08-06 DIAGNOSIS — M86172 Other acute osteomyelitis, left ankle and foot: Secondary | ICD-10-CM

## 2014-08-06 DIAGNOSIS — L03116 Cellulitis of left lower limb: Secondary | ICD-10-CM

## 2014-08-06 DIAGNOSIS — M869 Osteomyelitis, unspecified: Secondary | ICD-10-CM | POA: Diagnosis not present

## 2014-08-07 DIAGNOSIS — S91109D Unspecified open wound of unspecified toe(s) without damage to nail, subsequent encounter: Secondary | ICD-10-CM | POA: Diagnosis not present

## 2014-08-07 DIAGNOSIS — M869 Osteomyelitis, unspecified: Secondary | ICD-10-CM | POA: Diagnosis not present

## 2014-08-07 DIAGNOSIS — R279 Unspecified lack of coordination: Secondary | ICD-10-CM | POA: Diagnosis not present

## 2014-08-08 DIAGNOSIS — I10 Essential (primary) hypertension: Secondary | ICD-10-CM | POA: Diagnosis not present

## 2014-08-08 DIAGNOSIS — N400 Enlarged prostate without lower urinary tract symptoms: Secondary | ICD-10-CM | POA: Diagnosis not present

## 2014-08-08 DIAGNOSIS — E785 Hyperlipidemia, unspecified: Secondary | ICD-10-CM | POA: Diagnosis not present

## 2014-08-08 DIAGNOSIS — I259 Chronic ischemic heart disease, unspecified: Secondary | ICD-10-CM | POA: Diagnosis not present

## 2014-08-08 LAB — VANCOMYCIN, TROUGH: VANCOMYCIN TR: 21 ug/mL — AB (ref 10.0–20.0)

## 2014-08-09 NOTE — Progress Notes (Addendum)
Patient ID: Patrick [name], male   DOB: 1913/06/17, 81 y.o.   MRN: [id number]                PROGRESS NOTE  DATE:  08/04/2014            FACILITY: [facility]                    LEVEL OF CARE:   SNF   Acute Visit                          CHIEF COMPLAINT:  Follow up osteomyelitis in the left great toe, extensive cellulitis in the dorsal foot and ankle.    HISTORY OF PRESENT ILLNESS:  I continue to follow Mr. Qin for this issue.  His culture from the chronic wound on the tip of his left great toe grew MRSA.  X-rays suggested osteomyelitis.  He has had continued improvement in the degree of erythema spreading up his ankle and foot.  Although there is still swelling and some erythema of the toe, the pain here is a lot better.    The patient has hammertoes and has a chronic wound on the tip of the left great toe.  He continues to propel himself around in a wheelchair using that foot and I think that this is a lot of the reason that this has not healed.  He has had three venous duplex ultrasounds since October 2015.  None of them showed DVTs.   A previous arterial study in the bilateral legs did not show significant PAD.  His Doppler waveforms were triphasic.    REVIEW OF SYSTEMS:    MUSCULOSKELETAL:  Left leg:  The patient continues to say that this is considerably better.  This seems verified by the staff.   CHEST/RESPIRATORY:  No shortness of breath.    CARDIAC:  No chest pain.   GI:  No diarrhea.    PHYSICAL EXAMINATION:   GENERAL APPEARANCE:  The patient does not appear systemically well.   CHEST/RESPIRATORY:  Clear air entry bilaterally.    CARDIOVASCULAR:   CARDIAC:  Heart sounds are normal.  There are no murmurs.    GASTROINTESTINAL:   ABDOMEN:  No masses.     LIVER/SPLEEN/KIDNEYS:  No liver, no spleen.   MUSCULOSKELETAL:   EXTREMITIES:   LEFT LOWER EXTREMITY:  Left leg:   The wound on the tip of his toe looks continually open.  There will need to be debridement  done here, which I will try to arrange within the next days to maybe a week.  The edema of the toe is still extensive, but improved.  The erythema and swelling in the dorsal aspect of his foot up to his ankle continues to improve.   There is still tenderness on his lateral leg.  This seems to be in a venous distribution, perhaps the lesser saphenous vein.  This may be all superficial thrombophlebitis.    ASSESSMENT/PLAN:            Osteomyelitis of the left  great toe with secondary cellulitis secondary to MRSA.  The prolonged course of his vancomycin will need to continue.  His BUN and creatinine have remained stable at 7 and 0.97, respectively, as of 08/01/2014.  I will continue to check this in a week.    Pain on the medial aspect of the left leg and some swelling here, as well.  I wonder whether  this is superficial thrombophlebitis.  This does not appear to be a septic picture.  I will continue to monitor this for now.  Wrapping of the leg might also be useful.

## 2014-08-11 NOTE — Progress Notes (Addendum)
Patient ID: Patrick [name], male   DOB: 1913-11-25, 81 y.o.   MRN: [id number]                PROGRESS NOTE  DATE:  08/06/2014          FACILITY: [facility]                     LEVEL OF CARE:   SNF   Acute Visit               CHIEF COMPLAINT:  Debridement  of left great toe.    HISTORY OF PRESENT ILLNESS:  I have been following Patrick [name].  He has acute osteomyelitis in his left great toe.  This spread with cellulitis up the dorsum of his foot into his lower leg.  He declined hospitalization.  Culture of the wound grew MRSA and he is on a six-week course of vancomycin.  The erythema has gone down considerably in his leg and foot, and more recently his toe.  I came today to debride the wound and to see if we can get this in a better state for healing.    PHYSICAL EXAMINATION:   SKIN:   INSPECTION:   Left foot:  Once again, the erythema has considerably improved.  The swelling in his toe is better.    PROCEDURE:  The wound was anesthetized with 2% lidocaine and debrided with a #10 scalpel blade.  This was of surface slough and hypergranulation tissue.  While I was here, I removed a thick mycotic toenail.  All of this was done and well tolerated by the patient.       ASSESSMENT/PLAN:                             Acute osteomyelitis with coexistent cellulitis secondary to MRSA    Foot wound on the left great toe.  This was debrided.  I also removed the nail.  Changed to silver collagen based dressings and continued the vancomycin.

## 2014-08-12 DIAGNOSIS — E785 Hyperlipidemia, unspecified: Secondary | ICD-10-CM | POA: Diagnosis not present

## 2014-08-12 DIAGNOSIS — I10 Essential (primary) hypertension: Secondary | ICD-10-CM | POA: Diagnosis not present

## 2014-08-12 DIAGNOSIS — N400 Enlarged prostate without lower urinary tract symptoms: Secondary | ICD-10-CM | POA: Diagnosis not present

## 2014-08-12 DIAGNOSIS — I259 Chronic ischemic heart disease, unspecified: Secondary | ICD-10-CM | POA: Diagnosis not present

## 2014-08-12 LAB — CREATININE, SERUM
Creatinine, Ser: 1.1 mg/dL (ref 0.61–1.24)
GFR calc non Af Amer: 53 mL/min — ABNORMAL LOW (ref >60)

## 2014-08-12 LAB — BUN: BUN: 9 mg/dL (ref 6–20)

## 2014-08-12 LAB — VANCOMYCIN, TROUGH: VANCOMYCIN TR: 18 ug/mL (ref 10.0–20.0)

## 2014-08-13 DIAGNOSIS — F329 Major depressive disorder, single episode, unspecified: Secondary | ICD-10-CM | POA: Diagnosis not present

## 2014-08-15 ENCOUNTER — Other Ambulatory Visit (HOSPITAL_COMMUNITY)
Admission: AD | Admit: 2014-08-15 | Discharge: 2014-08-15 | Disposition: A | Discharge status: Home / Self Care | Payer: Medicare Other | Source: Skilled Nursing Facility | Attending: Internal Medicine | Admitting: Internal Medicine

## 2014-08-15 DIAGNOSIS — Z79899 Other long term (current) drug therapy: Secondary | ICD-10-CM | POA: Diagnosis not present

## 2014-08-15 LAB — VANCOMYCIN, TROUGH: Vancomycin Tr: 21 ug/mL — ABNORMAL HIGH (ref 10.0–20.0)

## 2014-08-20 ENCOUNTER — Non-Acute Institutional Stay (SKILLED_NURSING_FACILITY): Payer: Medicare Other | Admitting: Internal Medicine

## 2014-08-20 DIAGNOSIS — M86172 Other acute osteomyelitis, left ankle and foot: Secondary | ICD-10-CM

## 2014-08-20 DIAGNOSIS — S91109D Unspecified open wound of unspecified toe(s) without damage to nail, subsequent encounter: Secondary | ICD-10-CM

## 2014-08-21 ENCOUNTER — Other Ambulatory Visit (HOSPITAL_COMMUNITY)
Admission: RE | Admit: 2014-08-21 | Discharge: 2014-08-21 | Disposition: A | Discharge status: Home / Self Care | Payer: Medicare Other | Source: Ambulatory Visit | Attending: Internal Medicine | Admitting: Internal Medicine

## 2014-08-21 LAB — BUN: BUN: 9 mg/dL (ref 6–20)

## 2014-08-21 LAB — CREATININE, SERUM
Creatinine, Ser: 1.22 mg/dL (ref 0.61–1.24)
GFR calc Af Amer: 55 mL/min — ABNORMAL LOW (ref >60)
GFR calc non Af Amer: 47 mL/min — ABNORMAL LOW (ref >60)

## 2014-08-21 LAB — VANCOMYCIN, TROUGH: Vancomycin Tr: 13 ug/mL (ref 10.0–20.0)

## 2014-08-25 NOTE — Progress Notes (Addendum)
Patient ID: Patrick [name], male   DOB: 1913/10/02, 81 y.o.   MRN: [id number]                PROGRESS NOTE  DATE:  08/20/2014           FACILITY: [place] [facility]                         LEVEL OF CARE:   SNF   Acute Visit              CHIEF COMPLAINT:  Review of osteomyelitis, left great toe/open toe wound.      HISTORY OF PRESENT ILLNESS:  I continue to follow Mr. Albaugh for this issue.  His culture from the chronic wound on the tip of his left great toe grew MRSA.   X-ray suggested osteomyelitis.  He then continued to have spreading of erythema of his dorsal foot and ankle.  He refused hospitalization.  We were able to successfully treat him with vancomycin, which should be ending on roughly July 23.  The patient states he feels a lot better.  He has no pain.    The patient continues to have an open wound on the tip of the left great toe.  He has a forefoot offloading sandal that we discussed last time.  He does not have PAD, nor does he have venous DVT.    REVIEW OF SYSTEMS:   MUSCULOSKELETAL:  Left leg:  The patient continues to say his pain is resolved.  He has no pain.    PHYSICAL EXAMINATION:   CIRCULATION:   VASCULAR:    Left foot:  Peripheral pulses are absent, although not acutely ischemic.   SKIN:     INSPECTION:  The wound is still present.  There is hypergranulation here, which will need further debridement.  He is still having some drainage.    ASSESSMENT/PLAN:              Osteomyelitis of the left great toe.  He continues to make improvements.  His last creatinine is stable at 1.10 on the vancomycin.     Chronic open wound on the tip of his left great toe, which I think is largely pressure-related.  He is going to have change to silver alginate and he will likely need some debridement.

## 2014-08-29 LAB — BASIC METABOLIC PANEL
Anion gap: 6 (ref 5–15)
BUN: 9 mg/dL (ref 6–20)
CO2: 28 mmol/L (ref 22–32)
CREATININE: 1.14 mg/dL (ref 0.61–1.24)
Calcium: 8.1 mg/dL — ABNORMAL LOW (ref 8.9–10.3)
Chloride: 104 mmol/L (ref 101–111)
GFR calc non Af Amer: 51 mL/min — ABNORMAL LOW (ref >60)
GFR, EST AFRICAN AMERICAN: 59 mL/min — AB (ref >60)
GLUCOSE: 95 mg/dL (ref 65–99)
POTASSIUM: 3.9 mmol/L (ref 3.5–5.1)
Sodium: 138 mmol/L (ref 135–145)

## 2014-08-29 LAB — VANCOMYCIN, TROUGH: Vancomycin Tr: 12 ug/mL (ref 10.0–20.0)

## 2014-09-02 LAB — VANCOMYCIN, TROUGH: Vancomycin Tr: 18 ug/mL (ref 10.0–20.0)

## 2014-09-08 LAB — CREATININE, SERUM
CREATININE: 1.23 mg/dL (ref 0.61–1.24)
GFR calc Af Amer: 54 mL/min — ABNORMAL LOW (ref >60)
GFR, EST NON AFRICAN AMERICAN: 47 mL/min — AB (ref >60)

## 2014-09-08 LAB — VANCOMYCIN, TROUGH: Vancomycin Tr: 19 ug/mL (ref 10.0–20.0)

## 2014-09-08 LAB — BUN: BUN: 10 mg/dL (ref 6–20)

## 2014-09-30 ENCOUNTER — Non-Acute Institutional Stay (SKILLED_NURSING_FACILITY): Payer: Medicare Other | Admitting: Internal Medicine

## 2014-09-30 ENCOUNTER — Encounter: Payer: Self-pay | Admitting: Internal Medicine

## 2014-09-30 DIAGNOSIS — I509 Heart failure, unspecified: Secondary | ICD-10-CM | POA: Diagnosis not present

## 2014-09-30 DIAGNOSIS — L03032 Cellulitis of left toe: Secondary | ICD-10-CM

## 2014-09-30 DIAGNOSIS — N400 Enlarged prostate without lower urinary tract symptoms: Secondary | ICD-10-CM | POA: Diagnosis not present

## 2014-09-30 DIAGNOSIS — R634 Abnormal weight loss: Secondary | ICD-10-CM

## 2014-09-30 DIAGNOSIS — G2581 Restless legs syndrome: Secondary | ICD-10-CM

## 2014-09-30 NOTE — Progress Notes (Signed)
Patient ID: Patrick [name], male   DOB: 25-Jun-1913, 81 y.o.   MRN: [id number]          :         this is a routine visit Level care skilled.  Facility [facility].   Chief complaint----medical management of chronic medical conditions including left hip fracture-CHF-restless leg-history tachycardia-history weight loss--depression and BPH History of left great toe infection   .  History of present illness.   Patient is a very pleasant 81 year old male who was admitted to [facility] after sustaining a fall --he was diagnosed with a left femur neck fracture  This was surgically repaired with a hemi arthroplasty apparently he did quite well with this.  He was initially here for strengthening and physical therapy--but has become long-term-care Most acute issue recently was a left great toe infection which was quite significant he received long-term vancomycin therapy which he completed last month-this looks substantially better and is essentially resolved.  His other diagnoses include BPH--he is on Flomax and restless legs which he is on Requip for  He also has a history of depression and is on Zoloft.  Apparently shortly after his admission here he did have hypoxia with O2 saturation in the mid 80s-chest x-ray showed moderate bilateral effusions with bilateral passive atelectasis--he is now on low-dose Lasix and this appears to have been stable.  His stay here also has been complicated by tachycardia originally he was on Lopressor but his blood pressure did not really tolerate this well past the Lopressor has been discontinued nonetheless pulse rate now appears to be controlled --I do not see any recent tachycardic or  Consistent  bradycardic readings I do note occasionally he has systolics in the 51T I got 110/55 manually today he does not appear to be symptomatic of any hypotension at this point will continue to monitor his blood pressures but all in all appears to be doing quite  well He's also been treated for pneumonia and UTI during his stay here Patient is concerned today about some patches on the left side of his face and  his neck he has been seen by dermatology in the past I suspect he will need a repeat visit he does have numerous solar induced changes   -     Previous medical history.  History of left femur fracture status post repair.  BPH.  Restless legs.  Depression.  History of cervical DJD.  Osteoarthritis.  History of basal cell carcinoma.  Diverticulosis.  Hyperlipidemia.  Right inguinal hernia.  Ischemic heart disease.  Hiatal hernia.  History of kidney stones.  History of esophageal stricture and dilation in the past.  Recurrent TIAs.  Recurrent diverticulitis and perforation and exploratory laparotomy and sigmoid resection in June 2011.  GERD.  Hypertension  .  Social history-patient apparently lived in an assisted living facility--he has a supportive son who lives in [place].  Has a very distant history of tobacco use almost 50 years ago no history of alcohol abuse  .  Family history not pertinent.   Medication review per [facility] He is on aspirin enteric-coated 81 mg daily.  Colace 100 mg daily.  Vicodin 5-3 25 mg every 6 hours when necessary pain.  DuoNeb nebulizers every 6 hours when necessary.  Lasix 20 mg every other day.  Claritin 10 mg daily.  Melatonin 3 mg daily at bedtime when necessary.  Flomax 0.4 mg daily.  Zoloft 50 mg daily.  Potassium 10 mEq daily.  Prilosec 20 mg  daily  Review of systems   In general denies any fever chills--  Skin --again does have crusted lesion left side of his face also lesion at the base of his left neck and left earlobe  .  Head ears eyes nose mouth and throat-does not complain of any sore throat  .  Respiratory no complaints of shortness of breath or cough.  Cardiac- does have a history of coronary artery disease but does not complain of any chest pain --has some  left leg edema  GI-does not complaining of abdominal pain nausea vomiting diarrhea constipation-- does have a history of an inguinal hernia.  GU-does not complaining of overt dysuria today  Muscle skeletal-says his hip pain joint pain is controlled .  Neurologic-does not complaining of any headache dizziness numbness-does have a history of restless leg.  Psych history of depression does not appear to be depressed -- is in good spirits does not complain of depression or anxiety .  Physical exam.   Temperature 97.6 pulse 64 respirations 20 blood pressure taken manually 110/54 weight is 167 this appears stable during this month but a loss of about 5 pounds since mid July-.    In general this is a frail but quite alert and cognitively intact elderly male.  The skin is warm and dry--  Left foot appears markedly improved from when I last saw there is some minimal non-concerning erythema of his left great toe this does not appear to be cellulitic.  He does have a crusted lesion with a flat appearance left side of his face that also is a somewhat crusted erythematous area on the top of his left earlobe-he has what appears to be possibly an irritated developing seborrheic keratosis at the base of his left neck     Eyes pupils appear equal round react to light sclera and conjunctiva are clear he has prescription lenses visual acuity appears grossly intact.   Oropharynx is clear mucous membranes moist--.  Chest he has decreased breath sounds at the bases but no rhonchi rales or wheezes no labored breathing.  Heart is regular rate and rhythm without murmur gallop or rub he appears to have some Baseline edema of his left leg-this appears somewhat improved from previous exam-pedal pulses somewhat difficult to palpate which is not new    Abdomen is protuberant soft nontender he does have a well-healed surgical scar also note a right sided hernia--he does have bowel sounds in all 4 quadrants.    GU-the catheter draining amber colored urine Muscle  skeletal  Moves all extremities x4 largely ambulates in a wheelchair strength appears to be intact all extremities there is no significant pain with extension or flexion of his left hip ---  Neurologic-is grossly intact no focal deficits his speech is clear no lateralizing findings.  Psych he is alert and oriented x3 pleasant and appropriate  .  Labs 09/08/2014.  Creatinine 1.23-BUN 10.  08/29/2014.  Sodium 138 potassium 3.9 BUN 9 creatinine 1.14.  07/26/2014.  WBC 7.3 hemoglobin 10.8 platelets 211.  07/22/2014.  Albumin 2.9-AST 13-ALT 10-L, phosphatase 92   05/29/2014.  Sodium 137 potassium 4 BUN 14 creatinine 1.6.  Albumen 3.2 AST 60 otherwise liver function tests within normal limits.  WBC 10.2 hemoglobin 11.8 platelets 158  05/29/2014  Sodium 137 potassium 4 BUN 14 creatinine 1.00.  AST 60 otherwise liver function tests within normal limits.  WBC 10.2 hemoglobin 11.8 platelets 158.    01/27/2014.  WBC 6.5 hemoglobin 11.4 platelets 178.  Sodium 142 potassium 4.2 BUN 13 creatinine 1.03  .  12/05/2013.  Sodium 141 potassium 4.7 BUN 14 creatinine 1.06.  WBC 7.8 hemoglobin 11.9 platelets 190.     11/18/2013.  WBC 6.1 hemoglobin 12.0 platelets 174.  Sodium 143 potassium 4.2 BUN 11 creatinine 0.9 albumin 3.0 otherwise liver function tests within normal limits 09/11/2013.  WBC 6.4 hemoglobin 12.9 platelets 162.  Sodium 141 potassium 4.8 BUN 15 creatinine 0.9  08/22/2013.  TSH 1.599  .  Assessment and plan.  #1-left hip fracture with repair-this appears to be stable   -#2- CHF-this appears to be stable he is on low-dose Lasix with potassium update metabolic panel .  Marland Kitchen  #3-history of restless legs this appears to be stable on Requip   #4-history of BPH-continues on Flomax apparently this has been stable  .  #5-history of GERD-with hiatal hernia-he is on Zantac this appears to be relatively  controlled  .  #6-history depression he is on Zoloft this appears to be quite stable   #7-history of tachycardia- e is no longer on Lopressor --pulses appear to be controlled--recent pulses 70-64-57-   Pressures appear to be fairly stable occasionally systolics in the 23R but this is not consistent and he is asymptomatic  .   #9-history of increase left leg edema- This is somewhat chronic Dopplers have been negative.     #10-history constipation-he complained of this previously but appears the Colace which was added is helping at this point monitor.   #11-anemia-this appears to be  fairly stable most recent hemoglobin 10.8--we will update this   #12-history of skin lesions as noted above on left side of face neck and ear-will order a dermatology consult   #13 weight loss I have reviewed his weights appears this had stabilized but over the past month appearsshe's lost about 5 pounds and over the past couple months possibly as much as 12-apparently he is eating well now according to his nurse-I suspect possibly his left lower extremity infection contributed somewhat to this-he is on ensure supplementation at this point will monitor at one point he had been on Remeron we may need to restart this but apparently he is eating well now this will have to be watched also will update an albumin level I note it was 2.9 back in June   CPT- 99310--of note greater than 35 minutes spent assessing patient discussing his status with nursing staff-addressing his concerns-reviewing his chart-and coordinating and formulating a plan of care for numerous diagnoses-of note greater than 50% of time spent coordinating plan of care  \

## 2014-10-01 ENCOUNTER — Encounter (HOSPITAL_COMMUNITY)
Admission: AD | Admit: 2014-10-01 | Discharge: 2014-10-01 | Disposition: A | Discharge status: Home / Self Care | Payer: Medicare Other | Source: Skilled Nursing Facility | Attending: Internal Medicine | Admitting: Internal Medicine

## 2014-10-01 ENCOUNTER — Non-Acute Institutional Stay (SKILLED_NURSING_FACILITY): Payer: Medicare Other | Admitting: Internal Medicine

## 2014-10-01 DIAGNOSIS — I10 Essential (primary) hypertension: Secondary | ICD-10-CM | POA: Insufficient documentation

## 2014-10-01 DIAGNOSIS — H6123 Impacted cerumen, bilateral: Secondary | ICD-10-CM | POA: Diagnosis not present

## 2014-10-01 LAB — COMPREHENSIVE METABOLIC PANEL
ALBUMIN: 3.5 g/dL (ref 3.5–5.0)
ALT: 13 U/L — ABNORMAL LOW (ref 17–63)
ANION GAP: 6 (ref 5–15)
AST: 18 U/L (ref 15–41)
Alkaline Phosphatase: 104 U/L (ref 38–126)
BILIRUBIN TOTAL: 0.6 mg/dL (ref 0.3–1.2)
BUN: 11 mg/dL (ref 6–20)
CO2: 30 mmol/L (ref 22–32)
Calcium: 8.6 mg/dL — ABNORMAL LOW (ref 8.9–10.3)
Chloride: 102 mmol/L (ref 101–111)
Creatinine, Ser: 1.03 mg/dL (ref 0.61–1.24)
GFR calc Af Amer: >60 mL/min (ref >60)
GFR calc non Af Amer: 58 mL/min — ABNORMAL LOW (ref >60)
GLUCOSE: 93 mg/dL (ref 65–99)
POTASSIUM: 4.1 mmol/L (ref 3.5–5.1)
Sodium: 138 mmol/L (ref 135–145)
Total Protein: 7.2 g/dL (ref 6.5–8.1)

## 2014-10-01 LAB — CBC
HEMATOCRIT: 36.4 % — AB (ref 39.0–52.0)
HEMOGLOBIN: 11.7 g/dL — AB (ref 13.0–17.0)
MCH: 28.1 pg (ref 26.0–34.0)
MCHC: 32.1 g/dL (ref 30.0–36.0)
MCV: 87.3 fL (ref 78.0–100.0)
Platelets: 174 10*3/uL (ref 150–400)
RBC: 4.17 MIL/uL — ABNORMAL LOW (ref 4.22–5.81)
RDW: 15.5 % (ref 11.5–15.5)
WBC: 6.7 10*3/uL (ref 4.0–10.5)

## 2014-10-01 NOTE — Progress Notes (Signed)
Patient ID: Patrick [name], male   DOB: Feb 20, 1913, 81 y.o.   MRN: [id number]   This is an acute visit.  Level care skilled.  Facility [facility].  Chief complaint acute visit secondary to patient feels he has wax in his ears.  History of present illness.  Patient is a pleasant 81 year old male who I actually saw him for routine visit yesterday-today he is told nursing he feels his hearing is somewhat impaired and thinks he has wax in his ears and would like it checked out.  He does not complain of any ear pain.  Physical exam.  Tympanic membranes bilaterally are obscured by wax buildup they are visualized but minimally.  I do not note any erythema drainage or discharge or bleeding.  Assessment plan.  Cerumen buildup-will treat with the Debrox10 drops to each ear twice a day for 3 days and flushed with warm water on day 4-monitor for resolution  [id number]

## 2014-10-06 DIAGNOSIS — L820 Inflamed seborrheic keratosis: Secondary | ICD-10-CM | POA: Diagnosis not present

## 2014-10-06 DIAGNOSIS — L570 Actinic keratosis: Secondary | ICD-10-CM | POA: Diagnosis not present

## 2014-10-06 DIAGNOSIS — C44622 Squamous cell carcinoma of skin of right upper limb, including shoulder: Secondary | ICD-10-CM | POA: Diagnosis not present

## 2014-10-06 DIAGNOSIS — X32XXXD Exposure to sunlight, subsequent encounter: Secondary | ICD-10-CM | POA: Diagnosis not present

## 2014-10-06 DIAGNOSIS — Z08 Encounter for follow-up examination after completed treatment for malignant neoplasm: Secondary | ICD-10-CM | POA: Diagnosis not present

## 2014-10-06 DIAGNOSIS — Z85828 Personal history of other malignant neoplasm of skin: Secondary | ICD-10-CM | POA: Diagnosis not present

## 2014-10-07 ENCOUNTER — Non-Acute Institutional Stay (SKILLED_NURSING_FACILITY): Payer: Medicare Other | Admitting: Internal Medicine

## 2014-10-07 ENCOUNTER — Encounter: Payer: Self-pay | Admitting: Internal Medicine

## 2014-10-07 DIAGNOSIS — R634 Abnormal weight loss: Secondary | ICD-10-CM

## 2014-10-07 DIAGNOSIS — I959 Hypotension, unspecified: Secondary | ICD-10-CM | POA: Insufficient documentation

## 2014-10-07 DIAGNOSIS — G2581 Restless legs syndrome: Secondary | ICD-10-CM | POA: Diagnosis not present

## 2014-10-07 DIAGNOSIS — I9589 Other hypotension: Secondary | ICD-10-CM | POA: Diagnosis not present

## 2014-10-07 NOTE — Progress Notes (Signed)
Patient ID: Patrick [name], male   DOB: 1913-04-14, 81 y.o.   MRN: [id number]        this is an acute visit Level care skilled.  Facility [facility].   Chief complaint----   .  History of present illness.   Patient is a very pleasant 81 year old male with a long-term history of restless leg syndrome-he has been on Requip 1 mg daily at bedtime apparently with relief however over the past several weeks according to nursing he is complaining more of this he says it is inhibiting his sleep-.  Nursing also notes at times his systolic blood pressures in the 90s this does not appear to be overtly symptomatic-at one point he had been on Lopressor for some episodes of tachycardia but this was discontinued secondary to low blood pressure concerns nonetheless his rate has been controlled now for a considerable amount of time this has stabilized.  Today other than the restless legs at night he has no acute complaints he has received D Brock's for ear impaction recently-also saw dermatology earlier this week to have some precancerous lesions treated.       -     Previous medical history.  History of left femur fracture status post repair.  BPH.  Restless legs.  Depression.  History of cervical DJD.  Osteoarthritis.  History of basal cell carcinoma.  Diverticulosis.  Hyperlipidemia.  Right inguinal hernia.  Ischemic heart disease.  Hiatal hernia.  History of kidney stones.  History of esophageal stricture and dilation in the past.  Recurrent TIAs.  Recurrent diverticulitis and perforation and exploratory laparotomy and sigmoid resection in June 2011.  GERD.  Hypertension  .  Social history-patient apparently lived in an assisted living facility--he has a supportive son who lives in [place].  Has a very distant history of tobacco use almost 50 years ago no history of alcohol abuse  .  Family history not pertinent.   Medication review per [facility] He is on aspirin enteric-coated 81  mg daily.  Colace 100 mg daily.  Vicodin 5-3 25 mg every 6 hours when necessary pain.  DuoNeb nebulizers every 6 hours when necessary.  Lasix 20 mg every other day.  Claritin 10 mg daily.  Melatonin 3 mg daily at bedtime when necessary.  Flomax 0.4 mg daily.  Zoloft 50 mg daily.  Potassium 10 mEq daily.  Prilosec 20 mg daily  Review of systems   In general denies any fever chills--  Skin -- Again he has seen dermatology yesterday and had certain precancerous lesions treated mainly on the left side of his face  .  Head ears eyes nose mouth and throat-does not complain of any sore throat  .  Respiratory no complaints of shortness of breath or cough.  Cardiac- does have a history of coronary artery disease but does not complain of any chest pain --has some left leg edema  GI-does not complaining of abdominal pain nausea vomiting diarrhea constipation-- does have a history of an inguinal hernia.  GU-does not complaining of overt dysuria today  Muscle skeletal-says his hip pain joint pain is controlled .  Neurologic-does not complaining of any headache dizziness numbness-does have a history of restless leg and says this has gotten worse over the past several weeks.  Psych history of depression does not appear to be depressed -- is in good spirits does not complain of depression or anxiety .  Physical exam.  He is afebrile pulse 76 respirations 18 blood pressure 104/48 manually has systolic  variations from 140 down to the 90s I do not really see consistent readings below 979 systolically .    In general this is a frail but quite alert and cognitively intact elderly male.  The skin is warm and dry--he has numerous places on his face and neck that were treated yesterday by dermatology these do not appear to be infected increased erythematous or concerning    Eyes pupils appear equal round react to light sclera and conjunctiva are clear he has prescription lenses visual  acuity appears grossly intact.   Oropharynx is clear mucous membranes moist--.  Chest he has decreased breath sounds at the bases but no rhonchi rales or wheezes no labored breathing.  Heart is regular rate and rhythm without murmur gallop or rub he appears to have some Baseline edema of his left leg-    Abdomen is protuberant soft nontender he does have a well-healed surgical scar also note a right sided hernia--he does have bowel sounds in all 4 quadrants.   GU-the catheter draining amber colored urine Muscle  skeletal  Moves all extremities x4 largely ambulates in a wheelchair strength appears to be intact all extremities there is no significant pain extension and flexion at the knee-I did not note any deformities-of legs beyond baseline  Neurologic-is grossly intact no focal deficits his speech is clear no lateralizing findings.  Psych he is alert and oriented x3 pleasant and appropriate  .  Labs   10/01/2014.  Sodium 138 potassium 4.1 BUN 11 creatinine 1.03 CO2 30.  Liver function tests within normal limits except ALT of 13.  WBC 6.7 hemoglobin 11.7 platelets 174.    09/08/2014.  Creatinine 1.23-BUN 10.  08/29/2014.  Sodium 138 potassium 3.9 BUN 9 creatinine 1.14.  07/26/2014.  WBC 7.3 hemoglobin 10.8 platelets 211.  07/22/2014.  Albumin 2.9-AST 13-ALT 10-L, phosphatase 92   05/29/2014.  Sodium 137 potassium 4 BUN 14 creatinine 1.6.  Albumen 3.2 AST 60 otherwise liver function tests within normal limits.  WBC 10.2 hemoglobin 11.8 platelets 158  05/29/2014  Sodium 137 potassium 4 BUN 14 creatinine 1.00.  AST 60 otherwise liver function tests within normal limits.  WBC 10.2 hemoglobin 11.8 platelets 158.    01/27/2014.  WBC 6.5 hemoglobin 11.4 platelets 178.  Sodium 142 potassium 4.2 BUN 13 creatinine 1.03  .  12/05/2013.  Sodium 141 potassium 4.7 BUN 14 creatinine 1.06.  WBC 7.8 hemoglobin 11.9 platelets 190.     11/18/2013.  WBC  6.1 hemoglobin 12.0 platelets 174.  Sodium 143 potassium 4.2 BUN 11 creatinine 0.9 albumin 3.0 otherwise liver function tests within normal limits 09/11/2013.  WBC 6.4 hemoglobin 12.9 platelets 162.  Sodium 141 potassium 4.8 BUN 15 creatinine 0.9  08/22/2013.  TSH 1.599  .  Assessment and plan.    .  .  #1-history of restless legs apparently patient says this is gotten worse over last several weeks Y saw him last week he did not mention this-however he is quite adamant that it is causing difficulty sleeping at this point-will cautiously increase his Requip to 1.5 mg a day as suspect we can go up on this further but would like to see how he reacts to the somewhat higher dose-again somewhat conservative here secondary to his advanced age   #2-hypertension? Patient does not appear to be overtly symptomatic as noted above-I do not see consistent systolics in the 89Q although this does occur occasionally-at this point will monitor he is not on any blood pressure medication-again he had  been on Lopressor at one time that was discontinued because of low blood pressures nonetheless his heart rate continues to be well controlled-   Pressures appear to be fairly stable occasionally systolics in the 33V but this is not consistent and he is asymptomatic   #3-weight loss-this appears to have stabilized most recent weight 168 this is been stable now for about a month according to nursing staff eats  well will monitor  [id number] .         \

## 2014-10-13 DIAGNOSIS — R1314 Dysphagia, pharyngoesophageal phase: Secondary | ICD-10-CM | POA: Diagnosis not present

## 2014-10-13 DIAGNOSIS — M1990 Unspecified osteoarthritis, unspecified site: Secondary | ICD-10-CM | POA: Diagnosis not present

## 2014-10-13 DIAGNOSIS — F339 Major depressive disorder, recurrent, unspecified: Secondary | ICD-10-CM | POA: Diagnosis not present

## 2014-10-13 DIAGNOSIS — K219 Gastro-esophageal reflux disease without esophagitis: Secondary | ICD-10-CM | POA: Diagnosis not present

## 2014-10-13 DIAGNOSIS — E785 Hyperlipidemia, unspecified: Secondary | ICD-10-CM | POA: Diagnosis not present

## 2014-10-14 DIAGNOSIS — E785 Hyperlipidemia, unspecified: Secondary | ICD-10-CM | POA: Diagnosis not present

## 2014-10-14 DIAGNOSIS — K219 Gastro-esophageal reflux disease without esophagitis: Secondary | ICD-10-CM | POA: Diagnosis not present

## 2014-10-14 DIAGNOSIS — M1990 Unspecified osteoarthritis, unspecified site: Secondary | ICD-10-CM | POA: Diagnosis not present

## 2014-10-14 DIAGNOSIS — R1314 Dysphagia, pharyngoesophageal phase: Secondary | ICD-10-CM | POA: Diagnosis not present

## 2014-10-14 DIAGNOSIS — F339 Major depressive disorder, recurrent, unspecified: Secondary | ICD-10-CM | POA: Diagnosis not present

## 2014-10-15 DIAGNOSIS — K219 Gastro-esophageal reflux disease without esophagitis: Secondary | ICD-10-CM | POA: Diagnosis not present

## 2014-10-15 DIAGNOSIS — M79675 Pain in left toe(s): Secondary | ICD-10-CM | POA: Diagnosis not present

## 2014-10-15 DIAGNOSIS — M79674 Pain in right toe(s): Secondary | ICD-10-CM | POA: Diagnosis not present

## 2014-10-15 DIAGNOSIS — M1990 Unspecified osteoarthritis, unspecified site: Secondary | ICD-10-CM | POA: Diagnosis not present

## 2014-10-15 DIAGNOSIS — F339 Major depressive disorder, recurrent, unspecified: Secondary | ICD-10-CM | POA: Diagnosis not present

## 2014-10-15 DIAGNOSIS — B351 Tinea unguium: Secondary | ICD-10-CM | POA: Diagnosis not present

## 2014-10-15 DIAGNOSIS — E785 Hyperlipidemia, unspecified: Secondary | ICD-10-CM | POA: Diagnosis not present

## 2014-10-15 DIAGNOSIS — R1314 Dysphagia, pharyngoesophageal phase: Secondary | ICD-10-CM | POA: Diagnosis not present

## 2014-10-15 DIAGNOSIS — I739 Peripheral vascular disease, unspecified: Secondary | ICD-10-CM | POA: Diagnosis not present

## 2014-10-16 DIAGNOSIS — F339 Major depressive disorder, recurrent, unspecified: Secondary | ICD-10-CM | POA: Diagnosis not present

## 2014-10-16 DIAGNOSIS — E785 Hyperlipidemia, unspecified: Secondary | ICD-10-CM | POA: Diagnosis not present

## 2014-10-16 DIAGNOSIS — K219 Gastro-esophageal reflux disease without esophagitis: Secondary | ICD-10-CM | POA: Diagnosis not present

## 2014-10-16 DIAGNOSIS — M1990 Unspecified osteoarthritis, unspecified site: Secondary | ICD-10-CM | POA: Diagnosis not present

## 2014-10-16 DIAGNOSIS — R1314 Dysphagia, pharyngoesophageal phase: Secondary | ICD-10-CM | POA: Diagnosis not present

## 2014-10-20 DIAGNOSIS — R1314 Dysphagia, pharyngoesophageal phase: Secondary | ICD-10-CM | POA: Diagnosis not present

## 2014-10-20 DIAGNOSIS — M1990 Unspecified osteoarthritis, unspecified site: Secondary | ICD-10-CM | POA: Diagnosis not present

## 2014-10-20 DIAGNOSIS — F339 Major depressive disorder, recurrent, unspecified: Secondary | ICD-10-CM | POA: Diagnosis not present

## 2014-10-20 DIAGNOSIS — K219 Gastro-esophageal reflux disease without esophagitis: Secondary | ICD-10-CM | POA: Diagnosis not present

## 2014-10-20 DIAGNOSIS — E785 Hyperlipidemia, unspecified: Secondary | ICD-10-CM | POA: Diagnosis not present

## 2014-10-21 DIAGNOSIS — F339 Major depressive disorder, recurrent, unspecified: Secondary | ICD-10-CM | POA: Diagnosis not present

## 2014-10-21 DIAGNOSIS — E785 Hyperlipidemia, unspecified: Secondary | ICD-10-CM | POA: Diagnosis not present

## 2014-10-21 DIAGNOSIS — K219 Gastro-esophageal reflux disease without esophagitis: Secondary | ICD-10-CM | POA: Diagnosis not present

## 2014-10-21 DIAGNOSIS — M1990 Unspecified osteoarthritis, unspecified site: Secondary | ICD-10-CM | POA: Diagnosis not present

## 2014-10-21 DIAGNOSIS — R1314 Dysphagia, pharyngoesophageal phase: Secondary | ICD-10-CM | POA: Diagnosis not present

## 2014-10-22 DIAGNOSIS — M1990 Unspecified osteoarthritis, unspecified site: Secondary | ICD-10-CM | POA: Diagnosis not present

## 2014-10-22 DIAGNOSIS — K219 Gastro-esophageal reflux disease without esophagitis: Secondary | ICD-10-CM | POA: Diagnosis not present

## 2014-10-22 DIAGNOSIS — F339 Major depressive disorder, recurrent, unspecified: Secondary | ICD-10-CM | POA: Diagnosis not present

## 2014-10-22 DIAGNOSIS — E785 Hyperlipidemia, unspecified: Secondary | ICD-10-CM | POA: Diagnosis not present

## 2014-10-22 DIAGNOSIS — R1314 Dysphagia, pharyngoesophageal phase: Secondary | ICD-10-CM | POA: Diagnosis not present

## 2014-10-23 ENCOUNTER — Other Ambulatory Visit: Payer: Self-pay | Admitting: *Deleted

## 2014-10-23 DIAGNOSIS — R1314 Dysphagia, pharyngoesophageal phase: Secondary | ICD-10-CM | POA: Diagnosis not present

## 2014-10-23 DIAGNOSIS — F339 Major depressive disorder, recurrent, unspecified: Secondary | ICD-10-CM | POA: Diagnosis not present

## 2014-10-23 DIAGNOSIS — E785 Hyperlipidemia, unspecified: Secondary | ICD-10-CM | POA: Diagnosis not present

## 2014-10-23 DIAGNOSIS — M1990 Unspecified osteoarthritis, unspecified site: Secondary | ICD-10-CM | POA: Diagnosis not present

## 2014-10-23 DIAGNOSIS — K219 Gastro-esophageal reflux disease without esophagitis: Secondary | ICD-10-CM | POA: Diagnosis not present

## 2014-10-23 MED ORDER — HYDROCODONE-ACETAMINOPHEN 5-325 MG PO TABS: ORAL_TABLET | ORAL | Status: DC

## 2014-10-23 NOTE — Telephone Encounter (Signed)
[facility] 

## 2014-10-24 DIAGNOSIS — K219 Gastro-esophageal reflux disease without esophagitis: Secondary | ICD-10-CM | POA: Diagnosis not present

## 2014-10-24 DIAGNOSIS — E785 Hyperlipidemia, unspecified: Secondary | ICD-10-CM | POA: Diagnosis not present

## 2014-10-24 DIAGNOSIS — M1990 Unspecified osteoarthritis, unspecified site: Secondary | ICD-10-CM | POA: Diagnosis not present

## 2014-10-24 DIAGNOSIS — F339 Major depressive disorder, recurrent, unspecified: Secondary | ICD-10-CM | POA: Diagnosis not present

## 2014-10-24 DIAGNOSIS — R1314 Dysphagia, pharyngoesophageal phase: Secondary | ICD-10-CM | POA: Diagnosis not present

## 2014-10-27 DIAGNOSIS — K219 Gastro-esophageal reflux disease without esophagitis: Secondary | ICD-10-CM | POA: Diagnosis not present

## 2014-10-27 DIAGNOSIS — M1990 Unspecified osteoarthritis, unspecified site: Secondary | ICD-10-CM | POA: Diagnosis not present

## 2014-10-27 DIAGNOSIS — E785 Hyperlipidemia, unspecified: Secondary | ICD-10-CM | POA: Diagnosis not present

## 2014-10-27 DIAGNOSIS — F339 Major depressive disorder, recurrent, unspecified: Secondary | ICD-10-CM | POA: Diagnosis not present

## 2014-10-27 DIAGNOSIS — R1314 Dysphagia, pharyngoesophageal phase: Secondary | ICD-10-CM | POA: Diagnosis not present

## 2014-10-28 DIAGNOSIS — R1314 Dysphagia, pharyngoesophageal phase: Secondary | ICD-10-CM | POA: Diagnosis not present

## 2014-10-28 DIAGNOSIS — E785 Hyperlipidemia, unspecified: Secondary | ICD-10-CM | POA: Diagnosis not present

## 2014-10-28 DIAGNOSIS — M1990 Unspecified osteoarthritis, unspecified site: Secondary | ICD-10-CM | POA: Diagnosis not present

## 2014-10-28 DIAGNOSIS — K219 Gastro-esophageal reflux disease without esophagitis: Secondary | ICD-10-CM | POA: Diagnosis not present

## 2014-10-28 DIAGNOSIS — F339 Major depressive disorder, recurrent, unspecified: Secondary | ICD-10-CM | POA: Diagnosis not present

## 2014-10-29 ENCOUNTER — Non-Acute Institutional Stay (SKILLED_NURSING_FACILITY): Payer: Medicare Other | Admitting: Internal Medicine

## 2014-10-29 DIAGNOSIS — E785 Hyperlipidemia, unspecified: Secondary | ICD-10-CM | POA: Diagnosis not present

## 2014-10-29 DIAGNOSIS — F339 Major depressive disorder, recurrent, unspecified: Secondary | ICD-10-CM | POA: Diagnosis not present

## 2014-10-29 DIAGNOSIS — L309 Dermatitis, unspecified: Secondary | ICD-10-CM

## 2014-10-29 DIAGNOSIS — M1990 Unspecified osteoarthritis, unspecified site: Secondary | ICD-10-CM | POA: Diagnosis not present

## 2014-10-29 DIAGNOSIS — K219 Gastro-esophageal reflux disease without esophagitis: Secondary | ICD-10-CM | POA: Diagnosis not present

## 2014-10-29 DIAGNOSIS — R1314 Dysphagia, pharyngoesophageal phase: Secondary | ICD-10-CM | POA: Diagnosis not present

## 2014-10-30 DIAGNOSIS — M1990 Unspecified osteoarthritis, unspecified site: Secondary | ICD-10-CM | POA: Diagnosis not present

## 2014-10-30 DIAGNOSIS — R1314 Dysphagia, pharyngoesophageal phase: Secondary | ICD-10-CM | POA: Diagnosis not present

## 2014-10-30 DIAGNOSIS — E785 Hyperlipidemia, unspecified: Secondary | ICD-10-CM | POA: Diagnosis not present

## 2014-10-30 DIAGNOSIS — F339 Major depressive disorder, recurrent, unspecified: Secondary | ICD-10-CM | POA: Diagnosis not present

## 2014-10-30 DIAGNOSIS — K219 Gastro-esophageal reflux disease without esophagitis: Secondary | ICD-10-CM | POA: Diagnosis not present

## 2014-10-31 DIAGNOSIS — F339 Major depressive disorder, recurrent, unspecified: Secondary | ICD-10-CM | POA: Diagnosis not present

## 2014-10-31 DIAGNOSIS — E785 Hyperlipidemia, unspecified: Secondary | ICD-10-CM | POA: Diagnosis not present

## 2014-10-31 DIAGNOSIS — K219 Gastro-esophageal reflux disease without esophagitis: Secondary | ICD-10-CM | POA: Diagnosis not present

## 2014-10-31 DIAGNOSIS — M1990 Unspecified osteoarthritis, unspecified site: Secondary | ICD-10-CM | POA: Diagnosis not present

## 2014-10-31 DIAGNOSIS — R1314 Dysphagia, pharyngoesophageal phase: Secondary | ICD-10-CM | POA: Diagnosis not present

## 2014-11-01 NOTE — Progress Notes (Signed)
Patient ID: BRANSYN ADAMI, male   DOB: 07-20-1913, 81 y.o.   MRN: [id number]   This is an acute visit.  Level care skilled.  Facility [facility].  Chief complaint-acute visit follow-up right hand lesion.  History of present illness.  Patient is a very pleasant 81 year old male who does have a history of numerous solar induced issues-he has been seen by dermatology-and recently had some lesions removed or frozen off.  One of these lesions was on his dorsal right hand-most of dried up however one appears to be somewhat moist here  Nursing staff has asked me to look at it.  Patient is not complaining of any pain or discomfort with this.  Physical exam.  On the back of his right hand there is a small partially 1 cm in diameter area where it appears a lesion possibly was frozen off it is slightly moist-appearing there is no significant tenderness erythema drainage or elevation of this-I do not see signs of infection.  Assessment plan.  Dermatitis unspecified-at this point would encourage antibiotic ointment and monitor for any changes I do not see any sign of infection or areas of great concern right now but this will have to be watched I suspect with time this will gradually resolve.  In fact nursing staff has notified me that he actually has seen dermatology for follow-up of this as well and they did suggest again antibiotic ointment   [id number]

## 2014-11-03 ENCOUNTER — Encounter (HOSPITAL_COMMUNITY)
Admission: RE | Admit: 2014-11-03 | Discharge: 2014-11-03 | Disposition: A | Discharge status: Home / Self Care | Payer: Medicare Other | Source: Skilled Nursing Facility | Attending: Internal Medicine | Admitting: Internal Medicine

## 2014-11-03 DIAGNOSIS — Z79899 Other long term (current) drug therapy: Secondary | ICD-10-CM | POA: Diagnosis not present

## 2014-11-03 DIAGNOSIS — I509 Heart failure, unspecified: Secondary | ICD-10-CM | POA: Diagnosis not present

## 2014-11-03 LAB — MAGNESIUM: Magnesium: 2 mg/dL (ref 1.7–2.4)

## 2014-11-04 ENCOUNTER — Non-Acute Institutional Stay (SKILLED_NURSING_FACILITY): Payer: Medicare Other | Admitting: Internal Medicine

## 2014-11-04 DIAGNOSIS — G2581 Restless legs syndrome: Secondary | ICD-10-CM | POA: Diagnosis not present

## 2014-11-04 DIAGNOSIS — N400 Enlarged prostate without lower urinary tract symptoms: Secondary | ICD-10-CM | POA: Diagnosis not present

## 2014-11-04 DIAGNOSIS — R200 Anesthesia of skin: Secondary | ICD-10-CM | POA: Diagnosis not present

## 2014-11-04 DIAGNOSIS — K219 Gastro-esophageal reflux disease without esophagitis: Secondary | ICD-10-CM

## 2014-11-04 LAB — VITAMIN D 25 HYDROXY (VIT D DEFICIENCY, FRACTURES): VIT D 25 HYDROXY: 10.8 ng/mL — AB (ref 30.0–100.0)

## 2014-11-04 NOTE — Progress Notes (Signed)
Patient ID: Patrick [name], male   DOB: 1913-02-16, 81 y.o.   MRN: [id number]        this is a routine visit Level care skilled.  Facility [facility].   Chief complaint----medical management of chronic medical conditions including left hip fracture-CHF-restless leg-history tachycardia-history weight loss--depression and BPH History of left great toe infection Acute visit secondary to distal foot numbness   .  History of present illness.   Patient is a very pleasant 81 year old male who was admitted to [facility] after sustaining a fall --he was diagnosed with a left femur neck fracture  This was surgically repaired with a hemi arthroplasty apparently he did quite well with this.  He was initially here for strengthening and physical therapy--but has become long-term-care A recent  acute issue recently was a left great toe infection which was quite significant he received long-term vancomycin therapy which he completed in July-this looks substantially better and is essentially resolved.  His other diagnoses include BPH--he is on Flomax and restless legs which he is on Requip --this was actually up to twice a day but apparently there were pharmacy concerns and this has been scaled back to daily at bedtime apparently this is still helping  He also has a history of depression and is on Zoloft.  Apparently shortly after his admission here he did have hypoxia with O2 saturation in the mid 80s-chest x-ray showed moderate bilateral effusions with bilateral passive atelectasis--he is now on low-dose Lasix and this appears to have been stable.  His stay here also has been complicated by tachycardia originally he was on Lopressor but his blood pressure did not really tolerate this well past the Lopressor has been discontinued nonetheless pulse rate now appears to be controlled --I do not see any recent tachycardic or  Consistent  bradycardic readings I do note occasionally he has systolics in the 54O I  got 140/78 manually today he does not appear to be symptomatic of any hypotension at this point will continue to monitor his blood pressures but all in all appears to be doing quite well He's also been treated for pneumonia and UTI during his stay here  Patient's acute complaint today is numbness of his distal feet bilaterally way he describes his this is worse in the morning and gets somewhat better during the day says his toes on each foot feel like they are cramping in the morning and this resolves apparently throughout the day the numbness still persists somewhat however---  He has had arterial Dopplers done bilaterally I reviewed these these were done back in November 2015 and was not concerning on the right lower extremity which is his main complaint-apparently ABIs could not be calculated on the left because of noncompressibility of the vessels at the ankle-segmental Dopplers however apparently did show some atherosclerotic disease of the femoral popliteal region and bilateral tibial vessels.  Again though his main complaint is this is more on the right foot versus the left   -     Previous medical history.  History of left femur fracture status post repair.  BPH.  Restless legs.  Depression.  History of cervical DJD.  Osteoarthritis.  History of basal cell carcinoma.  Diverticulosis.  Hyperlipidemia.  Right inguinal hernia.  Ischemic heart disease.  Hiatal hernia.  History of kidney stones.  History of esophageal stricture and dilation in the past.  Recurrent TIAs.  Recurrent diverticulitis and perforation and exploratory laparotomy and sigmoid resection in June 2011.  GERD.  Hypertension  .  Social history-patient apparently lived in an assisted living facility--he has a supportive son who lives in [place].  Has a very distant history of tobacco use almost 50 years ago no history of alcohol abuse  .  Family history not pertinent.   Medication review per [facility] He is  on aspirin enteric-coated 81 mg daily.  Colace 100 mg daily.  Vicodin 5-3 25 mg every 6 hours when necessary pain.  DuoNeb nebulizers every 6 hours when necessary.  Lasix 20 mg every other day.  Claritin 10 mg daily.  Melatonin 3 mg daily at bedtime when necessary.  Flomax 0.4 mg daily.  Zoloft 50 mg daily.  Potassium 10 mEq daily.  Prilosec 20 mg daily  Requip 1.5 mg daily at bedtime  Review of systems   In general denies any fever chills--  Skin -- Did have recently's numerous skin lesions removed by dermatology or frozen-he had an area on the dorsum of his hand that was somewhat moist. When I last saw this appears to have dried up  Since than  .  Head ears eyes nose mouth and throat-does not complain of any sore throat  .  Respiratory no complaints of shortness of breath or cough.  Cardiac- does have a history of coronary artery disease but does not complain of any chest pain --has some left leg edema  GI-does not complaining of abdominal pain nausea vomiting diarrhea constipation-- does have a history of an inguinal hernia.  GU-does not complaining of overt dysuria today  Muscle skeletal-says his hip pain joint pain is controlled .  Neurologic-does not complaining of any headache dizziness versus leg symptoms apparently have improved-he is complaining of numbness as noted above.  Psych history of depression does not appear to be depressed -- is in good spirits does not complain of depression or anxiety .  Physical exam.    He is afebrile pulses 70 respirations 18 blood pressure taken manually today 140/78 I see previous ones 128/54-130/70-the last one I see is 87/42 I would wonder possibly this is more machine variation-weight is stable at 167.4-.    In general this is a frail but quite alert and cognitively intact elderly male.  His skin is warm and dry--again had numerous precancerous lesions recently removed      Eyes pupils appear equal round react to  light sclera and conjunctiva are clear he has prescription lenses visual acuity appears grossly intact.   Oropharynx is clear mucous membranes moist--.  Chest he has decreased breath sounds at the bases but no rhonchi rales or wheezes no labored breathing.  Heart is regular rate and rhythm without murmur gallop or rub he appears to have some mild edema of his left leg-this appearsstable from previous exam-pedal pulses somewhat difficult to palpate which is not new    Abdomen is protuberant soft nontender he does have a well-healed surgical scar also note a right sided hernia--he does have bowel sounds in all 4 quadrants.   GU-the catheter draining amber colored urine Muscle  skeletal  Moves all extremities x4 largely ambulates in a wheelchair strength appears to be intact all extremities there is no significant pain with extension or flexion of his left hip  Capillary refill appears to be intact of his toes bilaterally is able to move his toes bilaterally thisdoes not appear to be painful but do not really see any concerning erythema or edema of his distal feet bilaterally ---  Neurologic-is grossly intact no focal deficits his speech is clear  no lateralizing findings He appears to have possibly some decreased touch sensation of his distal feet bilaterally this as he states more pronounced left distal foot and the third fourth and fifth toes he does have touch sensation of the first and second toes.  Touch sensation is intact on the ankles and the proximal foot   .  Psych he is alert and oriented x3 pleasant and appropriate  .  Labs  11/03/2014.  Magnesium 2.0.  Vitamin D 10.8.  10/01/2014.  Sodium 138 potassium 4.1 BUN 11 creatinine 1.03 CO2 30.  WBC 6.7 hemoglobin 11.7 platelets 174.  Liver function tests within normal limits except ALT slightly low at 13-albumin was low normal at 3.5  09/08/2014.  Creatinine 1.23-BUN 10.  08/29/2014.  Sodium 138 potassium 3.9 BUN 9  creatinine 1.14.  07/26/2014.  WBC 7.3 hemoglobin 10.8 platelets 211.  07/22/2014.  Albumin 2.9-AST 13-ALT 10-L, phosphatase 92   05/29/2014.  Sodium 137 potassium 4 BUN 14 creatinine 1.6.  Albumen 3.2 AST 60 otherwise liver function tests within normal limits.  WBC 10.2 hemoglobin 11.8 platelets 158  05/29/2014  Sodium 137 potassium 4 BUN 14 creatinine 1.00.  AST 60 otherwise liver function tests within normal limits.  WBC 10.2 hemoglobin 11.8 platelets 158.    01/27/2014.  WBC 6.5 hemoglobin 11.4 platelets 178.  Sodium 142 potassium 4.2 BUN 13 creatinine 1.03  .  12/05/2013.  Sodium 141 potassium 4.7 BUN 14 creatinine 1.06.  WBC 7.8 hemoglobin 11.9 platelets 190.     11/18/2013.  WBC 6.1 hemoglobin 12.0 platelets 174.  Sodium 143 potassium 4.2 BUN 11 creatinine 0.9 albumin 3.0 otherwise liver function tests within normal limits 09/11/2013.  WBC 6.4 hemoglobin 12.9 platelets 162.  Sodium 141 potassium 4.8 BUN 15 creatinine 0.9  08/22/2013.  TSH 1.599  .  Assessment and plan.  #1-left hip fracture with repair-this appears to be stable   -#2- CHF-this appears to be stable he is on low-dose Lasix with potassium update metabolic panel .  Marland Kitchen  #3-history of restless legs this appears to be stable on Requip--again he is on this on a daily at bedtime   #4-history of BPH-continues on Flomax apparently this has been stable  .  #5-history of GERD-with hiatal hernia-he is on Zantac this appears to be relatively controlled  .  #6-history depression he is on Zoloft this appears to be quite stable   #7-history of tachycardia- e is no longer on Lopressor --pulses appear to be controlled--this has not been an issue in quite some time which is encouraging-   Pressures appear to be fairly stable occasionally systolics under 409 but this is not consistent and he is asymptomatic  .   #9-history of increase left leg edema- This is somewhat chronic Dopplers have  been negative.     #10-history constipation-he complained of this previously but appears the Colace which was added is helping at this point monitor.   #11-anemia-this appears to be  fairly stable most recent hemoglobin 11.7--we will update this   #12-history of skin lesions as noted above on left side of face neck and ear-these have been removed by dermatology   #13 weight loss I have reviewed his weights --- it appears most recent weights have been stable in the higher 160s per chart review it appears he eats between 50-100% of most of his meals he is on ensure supplementation as well  12-history of distal foot numbness left greater than right at this point will monitor I did  see him a bit later  he did not appear to be uncomfortable this appears to be more prominent when he wakes up in the morning-I did discuss this with Dr. Dellia Nims via phone-at some point possibly consider initiation of her neuropathic med like Neurontin or Lyrica-however consider patient's advanced age and that he is already on Requip at night would like to be very cautious before doing this-at this point will monitor and this was discussed with the patient Also will obtain a B12 level as well as hemoglobin A1c-patient does not have a history of diabetes  #13 vitamin D deficiency vitamin D level was low at 10.8 will supplement this  CPT- 99310--of note greater than 35 minutes spent assessing patient discussing his status with nursing staff--and Dr. Micheline Chapman his concerns-reviewing his chart-and coordinating and formulating a plan of care for numerous diagnoses-of note greater than 50% of time spent coordinating plan of care  \

## 2014-11-06 DIAGNOSIS — F329 Major depressive disorder, single episode, unspecified: Secondary | ICD-10-CM | POA: Diagnosis not present

## 2014-11-12 ENCOUNTER — Encounter (HOSPITAL_COMMUNITY)
Admission: AD | Admit: 2014-11-12 | Discharge: 2014-11-12 | Disposition: A | Discharge status: Home / Self Care | Payer: Medicare Other | Source: Skilled Nursing Facility | Attending: Internal Medicine | Admitting: Internal Medicine

## 2014-11-12 DIAGNOSIS — Z79899 Other long term (current) drug therapy: Secondary | ICD-10-CM | POA: Diagnosis not present

## 2014-11-12 DIAGNOSIS — I509 Heart failure, unspecified: Secondary | ICD-10-CM | POA: Diagnosis not present

## 2014-11-12 LAB — BASIC METABOLIC PANEL
ANION GAP: 6 (ref 5–15)
BUN: 13 mg/dL (ref 6–20)
CO2: 29 mmol/L (ref 22–32)
Calcium: 8.2 mg/dL — ABNORMAL LOW (ref 8.9–10.3)
Chloride: 99 mmol/L — ABNORMAL LOW (ref 101–111)
Creatinine, Ser: 1.01 mg/dL (ref 0.61–1.24)
GFR calc Af Amer: >60 mL/min (ref >60)
GFR, EST NON AFRICAN AMERICAN: 59 mL/min — AB (ref >60)
GLUCOSE: 95 mg/dL (ref 65–99)
POTASSIUM: 4.3 mmol/L (ref 3.5–5.1)
SODIUM: 134 mmol/L — AB (ref 135–145)

## 2014-11-12 LAB — VITAMIN B12: VITAMIN B 12: 277 pg/mL (ref 180–914)

## 2014-11-12 LAB — CBC
HEMATOCRIT: 35.1 % — AB (ref 39.0–52.0)
Hemoglobin: 11.4 g/dL — ABNORMAL LOW (ref 13.0–17.0)
MCH: 29.2 pg (ref 26.0–34.0)
MCHC: 32.5 g/dL (ref 30.0–36.0)
MCV: 90 fL (ref 78.0–100.0)
Platelets: 195 10*3/uL (ref 150–400)
RBC: 3.9 MIL/uL — ABNORMAL LOW (ref 4.22–5.81)
RDW: 15.1 % (ref 11.5–15.5)
WBC: 7.6 10*3/uL (ref 4.0–10.5)

## 2014-11-13 LAB — HEMOGLOBIN A1C
HEMOGLOBIN A1C: 6.1 % — AB (ref 4.8–5.6)
Mean Plasma Glucose: 128 mg/dL

## 2014-12-04 ENCOUNTER — Inpatient Hospital Stay (HOSPITAL_COMMUNITY): Payer: Self-pay

## 2014-12-04 ENCOUNTER — Inpatient Hospital Stay (HOSPITAL_COMMUNITY)
Admit: 2014-12-04 | Discharge: 2014-12-04 | Disposition: A | Discharge status: Home / Self Care | Payer: Medicare Other | Attending: Internal Medicine | Admitting: Internal Medicine

## 2014-12-04 DIAGNOSIS — R0989 Other specified symptoms and signs involving the circulatory and respiratory systems: Secondary | ICD-10-CM | POA: Diagnosis not present

## 2014-12-05 ENCOUNTER — Encounter (HOSPITAL_COMMUNITY): Payer: Self-pay | Admitting: Emergency Medicine

## 2014-12-05 ENCOUNTER — Emergency Department (HOSPITAL_COMMUNITY): Payer: Medicare Other

## 2014-12-05 ENCOUNTER — Other Ambulatory Visit: Payer: Self-pay

## 2014-12-05 ENCOUNTER — Encounter (HOSPITAL_COMMUNITY)
Admission: AD | Admit: 2014-12-05 | Discharge: 2014-12-05 | Disposition: A | Discharge status: Home / Self Care | Payer: Medicare Other | Source: Skilled Nursing Facility | Attending: Internal Medicine | Admitting: Internal Medicine

## 2014-12-05 ENCOUNTER — Other Ambulatory Visit (HOSPITAL_COMMUNITY)
Admission: RE | Admit: 2014-12-05 | Discharge: 2014-12-05 | Disposition: A | Discharge status: Home / Self Care | Payer: Medicare Other | Source: Skilled Nursing Facility | Attending: Internal Medicine | Admitting: Internal Medicine

## 2014-12-05 ENCOUNTER — Inpatient Hospital Stay (HOSPITAL_COMMUNITY)
Admission: EM | Admit: 2014-12-05 | Discharge: 2014-12-08 | DRG: 698 | Disposition: A | Discharge status: Skilled Nursing Facility | Payer: Medicare Other | Attending: Internal Medicine | Admitting: Internal Medicine

## 2014-12-05 ENCOUNTER — Non-Acute Institutional Stay (SKILLED_NURSING_FACILITY): Payer: Medicare Other | Admitting: Internal Medicine

## 2014-12-05 DIAGNOSIS — E785 Hyperlipidemia, unspecified: Secondary | ICD-10-CM | POA: Diagnosis present

## 2014-12-05 DIAGNOSIS — I110 Hypertensive heart disease with heart failure: Secondary | ICD-10-CM | POA: Diagnosis present

## 2014-12-05 DIAGNOSIS — R05 Cough: Secondary | ICD-10-CM | POA: Diagnosis not present

## 2014-12-05 DIAGNOSIS — Y95 Nosocomial condition: Secondary | ICD-10-CM

## 2014-12-05 DIAGNOSIS — J189 Pneumonia, unspecified organism: Secondary | ICD-10-CM

## 2014-12-05 DIAGNOSIS — N400 Enlarged prostate without lower urinary tract symptoms: Secondary | ICD-10-CM | POA: Diagnosis not present

## 2014-12-05 DIAGNOSIS — I10 Essential (primary) hypertension: Secondary | ICD-10-CM | POA: Diagnosis not present

## 2014-12-05 DIAGNOSIS — B964 Proteus (mirabilis) (morganii) as the cause of diseases classified elsewhere: Secondary | ICD-10-CM | POA: Diagnosis present

## 2014-12-05 DIAGNOSIS — Y846 Urinary catheterization as the cause of abnormal reaction of the patient, or of later complication, without mention of misadventure at the time of the procedure: Secondary | ICD-10-CM | POA: Diagnosis present

## 2014-12-05 DIAGNOSIS — A419 Sepsis, unspecified organism: Secondary | ICD-10-CM | POA: Diagnosis present

## 2014-12-05 DIAGNOSIS — Z23 Encounter for immunization: Secondary | ICD-10-CM

## 2014-12-05 DIAGNOSIS — N390 Urinary tract infection, site not specified: Secondary | ICD-10-CM | POA: Diagnosis not present

## 2014-12-05 DIAGNOSIS — N289 Disorder of kidney and ureter, unspecified: Secondary | ICD-10-CM

## 2014-12-05 DIAGNOSIS — Z66 Do not resuscitate: Secondary | ICD-10-CM | POA: Diagnosis present

## 2014-12-05 DIAGNOSIS — N170 Acute kidney failure with tubular necrosis: Secondary | ICD-10-CM | POA: Diagnosis present

## 2014-12-05 DIAGNOSIS — D638 Anemia in other chronic diseases classified elsewhere: Secondary | ICD-10-CM | POA: Diagnosis present

## 2014-12-05 DIAGNOSIS — T83511A Infection and inflammatory reaction due to indwelling urethral catheter, initial encounter: Principal | ICD-10-CM | POA: Diagnosis present

## 2014-12-05 DIAGNOSIS — R739 Hyperglycemia, unspecified: Secondary | ICD-10-CM | POA: Diagnosis present

## 2014-12-05 DIAGNOSIS — N179 Acute kidney failure, unspecified: Secondary | ICD-10-CM | POA: Diagnosis present

## 2014-12-05 DIAGNOSIS — Z8673 Personal history of transient ischemic attack (TIA), and cerebral infarction without residual deficits: Secondary | ICD-10-CM | POA: Diagnosis not present

## 2014-12-05 DIAGNOSIS — I259 Chronic ischemic heart disease, unspecified: Secondary | ICD-10-CM | POA: Insufficient documentation

## 2014-12-05 DIAGNOSIS — R509 Fever, unspecified: Secondary | ICD-10-CM

## 2014-12-05 DIAGNOSIS — D649 Anemia, unspecified: Secondary | ICD-10-CM | POA: Diagnosis present

## 2014-12-05 DIAGNOSIS — R1314 Dysphagia, pharyngoesophageal phase: Secondary | ICD-10-CM | POA: Diagnosis not present

## 2014-12-05 DIAGNOSIS — I509 Heart failure, unspecified: Secondary | ICD-10-CM | POA: Diagnosis present

## 2014-12-05 DIAGNOSIS — M1990 Unspecified osteoarthritis, unspecified site: Secondary | ICD-10-CM | POA: Diagnosis present

## 2014-12-05 DIAGNOSIS — D72829 Elevated white blood cell count, unspecified: Secondary | ICD-10-CM | POA: Diagnosis not present

## 2014-12-05 DIAGNOSIS — K4090 Unilateral inguinal hernia, without obstruction or gangrene, not specified as recurrent: Secondary | ICD-10-CM | POA: Diagnosis not present

## 2014-12-05 DIAGNOSIS — K219 Gastro-esophageal reflux disease without esophagitis: Secondary | ICD-10-CM | POA: Diagnosis not present

## 2014-12-05 DIAGNOSIS — M869 Osteomyelitis, unspecified: Secondary | ICD-10-CM | POA: Diagnosis not present

## 2014-12-05 DIAGNOSIS — M6281 Muscle weakness (generalized): Secondary | ICD-10-CM | POA: Diagnosis not present

## 2014-12-05 DIAGNOSIS — K5790 Diverticulosis of intestine, part unspecified, without perforation or abscess without bleeding: Secondary | ICD-10-CM | POA: Diagnosis not present

## 2014-12-05 DIAGNOSIS — Z85828 Personal history of other malignant neoplasm of skin: Secondary | ICD-10-CM

## 2014-12-05 DIAGNOSIS — Z9181 History of falling: Secondary | ICD-10-CM | POA: Diagnosis not present

## 2014-12-05 DIAGNOSIS — S91109D Unspecified open wound of unspecified toe(s) without damage to nail, subsequent encounter: Secondary | ICD-10-CM | POA: Diagnosis not present

## 2014-12-05 DIAGNOSIS — G2581 Restless legs syndrome: Secondary | ICD-10-CM | POA: Diagnosis present

## 2014-12-05 DIAGNOSIS — F339 Major depressive disorder, recurrent, unspecified: Secondary | ICD-10-CM | POA: Diagnosis not present

## 2014-12-05 DIAGNOSIS — R278 Other lack of coordination: Secondary | ICD-10-CM | POA: Diagnosis not present

## 2014-12-05 HISTORY — DX: Do not resuscitate: Z66

## 2014-12-05 HISTORY — DX: Retention of urine, unspecified: R33.9

## 2014-12-05 LAB — URINALYSIS, ROUTINE W REFLEX MICROSCOPIC
BILIRUBIN URINE: NEGATIVE
GLUCOSE, UA: NEGATIVE mg/dL
Ketones, ur: NEGATIVE mg/dL
Nitrite: POSITIVE — AB
Protein, ur: 30 mg/dL — AB
SPECIFIC GRAVITY, URINE: 1.01 (ref 1.005–1.030)
UROBILINOGEN UA: 0.2 mg/dL (ref 0.0–1.0)
pH: >9 — ABNORMAL HIGH (ref 5.0–8.0)

## 2014-12-05 LAB — COMPREHENSIVE METABOLIC PANEL
ALBUMIN: 3 g/dL — AB (ref 3.5–5.0)
ALK PHOS: 93 U/L (ref 38–126)
ALK PHOS: 85 U/L (ref 38–126)
ALT: 14 U/L — AB (ref 17–63)
ALT: 14 U/L — AB (ref 17–63)
ALT: 13 U/L — ABNORMAL LOW (ref 17–63)
AST: 26 U/L (ref 15–41)
AST: 31 U/L (ref 15–41)
AST: 24 U/L (ref 15–41)
Albumin: 3.3 g/dL — ABNORMAL LOW (ref 3.5–5.0)
Albumin: 3.1 g/dL — ABNORMAL LOW (ref 3.5–5.0)
Alkaline Phosphatase: 95 U/L (ref 38–126)
Anion gap: 11 (ref 5–15)
Anion gap: 13 (ref 5–15)
Anion gap: 9 (ref 5–15)
BILIRUBIN TOTAL: 0.9 mg/dL (ref 0.3–1.2)
BILIRUBIN TOTAL: 0.7 mg/dL (ref 0.3–1.2)
BUN: 23 mg/dL — ABNORMAL HIGH (ref 6–20)
BUN: 24 mg/dL — AB (ref 6–20)
BUN: 27 mg/dL — AB (ref 6–20)
CALCIUM: 8.6 mg/dL — AB (ref 8.9–10.3)
CALCIUM: 8.4 mg/dL — AB (ref 8.9–10.3)
CO2: 26 mmol/L (ref 22–32)
CO2: 22 mmol/L (ref 22–32)
CO2: 24 mmol/L (ref 22–32)
CREATININE: 1.58 mg/dL — AB (ref 0.61–1.24)
CREATININE: 1.67 mg/dL — AB (ref 0.61–1.24)
Calcium: 8.8 mg/dL — ABNORMAL LOW (ref 8.9–10.3)
Chloride: 100 mmol/L — ABNORMAL LOW (ref 101–111)
Chloride: 99 mmol/L — ABNORMAL LOW (ref 101–111)
Chloride: 100 mmol/L — ABNORMAL LOW (ref 101–111)
Creatinine, Ser: 1.79 mg/dL — ABNORMAL HIGH (ref 0.61–1.24)
GFR calc Af Amer: 37 mL/min — ABNORMAL LOW (ref >60)
GFR calc Af Amer: 34 mL/min — ABNORMAL LOW (ref >60)
GFR calc non Af Amer: 30 mL/min — ABNORMAL LOW (ref >60)
GFR, EST AFRICAN AMERICAN: 40 mL/min — AB (ref >60)
GFR, EST NON AFRICAN AMERICAN: 34 mL/min — AB (ref >60)
GFR, EST NON AFRICAN AMERICAN: 32 mL/min — AB (ref >60)
GLUCOSE: 154 mg/dL — AB (ref 65–99)
Glucose, Bld: 110 mg/dL — ABNORMAL HIGH (ref 65–99)
Glucose, Bld: 111 mg/dL — ABNORMAL HIGH (ref 65–99)
POTASSIUM: 4.4 mmol/L (ref 3.5–5.1)
Potassium: 4.4 mmol/L (ref 3.5–5.1)
Potassium: 4.2 mmol/L (ref 3.5–5.1)
SODIUM: 133 mmol/L — AB (ref 135–145)
Sodium: 137 mmol/L (ref 135–145)
Sodium: 134 mmol/L — ABNORMAL LOW (ref 135–145)
TOTAL PROTEIN: 6.7 g/dL (ref 6.5–8.1)
Total Bilirubin: 0.8 mg/dL (ref 0.3–1.2)
Total Protein: 7.3 g/dL (ref 6.5–8.1)
Total Protein: 6.8 g/dL (ref 6.5–8.1)

## 2014-12-05 LAB — CBC WITH DIFFERENTIAL/PLATELET
BASOS ABS: 0 10*3/uL (ref 0.0–0.1)
BASOS ABS: 0 10*3/uL (ref 0.0–0.1)
BASOS ABS: 0 10*3/uL (ref 0.0–0.1)
BASOS PCT: 0 %
Basophils Relative: 0 %
Basophils Relative: 0 %
EOS PCT: 0 %
EOS PCT: 0 %
Eosinophils Absolute: 0 10*3/uL (ref 0.0–0.7)
Eosinophils Absolute: 0 10*3/uL (ref 0.0–0.7)
Eosinophils Absolute: 0 10*3/uL (ref 0.0–0.7)
Eosinophils Relative: 0 %
HCT: 33.5 % — ABNORMAL LOW (ref 39.0–52.0)
HEMATOCRIT: 32.9 % — AB (ref 39.0–52.0)
HEMATOCRIT: 30.7 % — AB (ref 39.0–52.0)
HEMOGLOBIN: 10.7 g/dL — AB (ref 13.0–17.0)
Hemoglobin: 11.2 g/dL — ABNORMAL LOW (ref 13.0–17.0)
Hemoglobin: 10.2 g/dL — ABNORMAL LOW (ref 13.0–17.0)
LYMPHS PCT: 5 %
LYMPHS PCT: 5 %
LYMPHS PCT: 4 %
Lymphs Abs: 2.6 10*3/uL (ref 0.7–4.0)
Lymphs Abs: 2.5 10*3/uL (ref 0.7–4.0)
Lymphs Abs: 1.6 10*3/uL (ref 0.7–4.0)
MCH: 29.9 pg (ref 26.0–34.0)
MCH: 29.2 pg (ref 26.0–34.0)
MCH: 29.5 pg (ref 26.0–34.0)
MCHC: 33.4 g/dL (ref 30.0–36.0)
MCHC: 32.5 g/dL (ref 30.0–36.0)
MCHC: 33.2 g/dL (ref 30.0–36.0)
MCV: 89.6 fL (ref 78.0–100.0)
MCV: 89.6 fL (ref 78.0–100.0)
MCV: 88.7 fL (ref 78.0–100.0)
MONO ABS: 2.4 10*3/uL — AB (ref 0.1–1.0)
MONOS PCT: 6 %
Monocytes Absolute: 3.2 10*3/uL — ABNORMAL HIGH (ref 0.1–1.0)
Monocytes Absolute: 3.4 10*3/uL — ABNORMAL HIGH (ref 0.1–1.0)
Monocytes Relative: 6 %
Monocytes Relative: 7 %
NEUTROS PCT: 89 %
Neutro Abs: 45.2 10*3/uL — ABNORMAL HIGH (ref 1.7–7.7)
Neutro Abs: 42.7 10*3/uL — ABNORMAL HIGH (ref 1.7–7.7)
Neutro Abs: 34.7 10*3/uL — ABNORMAL HIGH (ref 1.7–7.7)
Neutrophils Relative %: 88 %
Neutrophils Relative %: 90 %
PLATELETS: 225 10*3/uL (ref 150–400)
PLATELETS: 215 10*3/uL (ref 150–400)
Platelets: 234 10*3/uL (ref 150–400)
RBC: 3.74 MIL/uL — AB (ref 4.22–5.81)
RBC: 3.67 MIL/uL — AB (ref 4.22–5.81)
RBC: 3.46 MIL/uL — ABNORMAL LOW (ref 4.22–5.81)
RDW: 14.5 % (ref 11.5–15.5)
RDW: 14.6 % (ref 11.5–15.5)
RDW: 14.6 % (ref 11.5–15.5)
WBC: 51 10*3/uL — AB (ref 4.0–10.5)
WBC: 48.6 10*3/uL — AB (ref 4.0–10.5)
WBC: 38.7 10*3/uL — ABNORMAL HIGH (ref 4.0–10.5)

## 2014-12-05 LAB — TROPONIN I: Troponin I: <0.03 ng/mL (ref <0.031)

## 2014-12-05 LAB — URINE MICROSCOPIC-ADD ON

## 2014-12-05 LAB — LACTIC ACID, PLASMA: Lactic Acid, Venous: 1.9 mmol/L (ref 0.5–2.0)

## 2014-12-05 LAB — I-STAT CG4 LACTIC ACID, ED: Lactic Acid, Venous: 3.35 mmol/L (ref 0.5–2.0)

## 2014-12-05 MED ORDER — PANTOPRAZOLE SODIUM 40 MG PO TBEC
40.0000 mg | DELAYED_RELEASE_TABLET | Freq: Every day | ORAL | Status: DC
  Administered 2014-12-06 – 2014-12-08 (×3): 40 mg via ORAL
  Filled 2014-12-05 (×3): qty 1

## 2014-12-05 MED ORDER — ASPIRIN EC 81 MG PO TBEC
81.0000 mg | DELAYED_RELEASE_TABLET | Freq: Every day | ORAL | Status: DC
  Administered 2014-12-06 – 2014-12-08 (×3): 81 mg via ORAL
  Filled 2014-12-05 (×4): qty 1

## 2014-12-05 MED ORDER — PIPERACILLIN-TAZOBACTAM 3.375 G IVPB 30 MIN
3.3750 g | Freq: Three times a day (TID) | INTRAVENOUS | Status: DC
  Administered 2014-12-06: 3.375 g via INTRAVENOUS
  Filled 2014-12-05 (×9): qty 50

## 2014-12-05 MED ORDER — MELATONIN 3 MG PO CAPS: 3.0000 mg | ORAL_CAPSULE | Freq: Every evening | ORAL | Status: DC | PRN

## 2014-12-05 MED ORDER — ALBUTEROL SULFATE (2.5 MG/3ML) 0.083% IN NEBU
2.5000 mg | INHALATION_SOLUTION | RESPIRATORY_TRACT | Status: DC
  Administered 2014-12-05: 2.5 mg via RESPIRATORY_TRACT
  Filled 2014-12-05: qty 3

## 2014-12-05 MED ORDER — VITAMIN D 1000 UNITS PO TABS
2000.0000 [IU] | ORAL_TABLET | Freq: Every day | ORAL | Status: DC
Start: 2014-12-05 — End: 2014-12-08
  Administered 2014-12-06 – 2014-12-08 (×3): 2000 [IU] via ORAL
  Filled 2014-12-05 (×3): qty 2

## 2014-12-05 MED ORDER — ROPINIROLE HCL 1 MG PO TABS
1.5000 mg | ORAL_TABLET | Freq: Every day | ORAL | Status: DC
  Administered 2014-12-06 – 2014-12-07 (×3): 1.5 mg via ORAL
  Filled 2014-12-05 (×3): qty 2

## 2014-12-05 MED ORDER — PIPERACILLIN-TAZOBACTAM 3.375 G IVPB 30 MIN
3.3750 g | Freq: Three times a day (TID) | INTRAVENOUS | Status: DC
  Administered 2014-12-05: 3.375 g via INTRAVENOUS
  Filled 2014-12-05: qty 50

## 2014-12-05 MED ORDER — LEVALBUTEROL HCL 0.63 MG/3ML IN NEBU
0.6300 mg | INHALATION_SOLUTION | Freq: Four times a day (QID) | RESPIRATORY_TRACT | Status: DC
  Administered 2014-12-06 – 2014-12-08 (×9): 0.63 mg via RESPIRATORY_TRACT
  Filled 2014-12-05 (×9): qty 3

## 2014-12-05 MED ORDER — BISACODYL 10 MG RE SUPP: 10.0000 mg | RECTAL | Status: DC | PRN

## 2014-12-05 MED ORDER — SODIUM CHLORIDE 0.9 % IV SOLN
INTRAVENOUS | Status: DC
  Administered 2014-12-05 – 2014-12-06 (×2): via INTRAVENOUS

## 2014-12-05 MED ORDER — PIPERACILLIN SOD-TAZOBACTAM SO 2.25 (2-0.25) G IV SOLR
2.2500 g | Freq: Three times a day (TID) | INTRAVENOUS | Status: DC
  Filled 2014-12-05 (×8): qty 2.25

## 2014-12-05 MED ORDER — SERTRALINE HCL 50 MG PO TABS
50.0000 mg | ORAL_TABLET | Freq: Every day | ORAL | Status: DC
  Administered 2014-12-06 – 2014-12-08 (×3): 50 mg via ORAL
  Filled 2014-12-05 (×3): qty 1

## 2014-12-05 MED ORDER — LEVALBUTEROL HCL 0.63 MG/3ML IN NEBU: 0.6300 mg | INHALATION_SOLUTION | Freq: Four times a day (QID) | RESPIRATORY_TRACT | Status: DC

## 2014-12-05 MED ORDER — VANCOMYCIN HCL IN DEXTROSE 1-5 GM/200ML-% IV SOLN
1000.0000 mg | Freq: Once | INTRAVENOUS | Status: AC
  Administered 2014-12-05: 1000 mg via INTRAVENOUS
  Filled 2014-12-05: qty 200

## 2014-12-05 MED ORDER — HYDROCODONE-ACETAMINOPHEN 5-325 MG PO TABS
1.0000 | ORAL_TABLET | ORAL | Status: DC | PRN
  Administered 2014-12-05 – 2014-12-08 (×7): 1 via ORAL
  Filled 2014-12-05 (×7): qty 1

## 2014-12-05 MED ORDER — SODIUM CHLORIDE 0.9 % IV SOLN: INTRAVENOUS | Status: DC

## 2014-12-05 MED ORDER — MAGNESIUM HYDROXIDE 400 MG/5ML PO SUSP: 30.0000 mL | Freq: Every day | ORAL | Status: DC | PRN

## 2014-12-05 MED ORDER — IPRATROPIUM-ALBUTEROL 0.5-2.5 (3) MG/3ML IN SOLN: 3.0000 mL | Freq: Four times a day (QID) | RESPIRATORY_TRACT | Status: DC | PRN

## 2014-12-05 MED ORDER — FUROSEMIDE 20 MG PO TABS
20.0000 mg | ORAL_TABLET | ORAL | Status: DC
  Administered 2014-12-07: 20 mg via ORAL
  Filled 2014-12-05: qty 1

## 2014-12-05 MED ORDER — IPRATROPIUM BROMIDE 0.02 % IN SOLN
0.5000 mg | Freq: Four times a day (QID) | RESPIRATORY_TRACT | Status: DC
  Administered 2014-12-05: 0.5 mg via RESPIRATORY_TRACT
  Filled 2014-12-05: qty 2.5

## 2014-12-05 MED ORDER — SODIUM CHLORIDE 0.9 % IV BOLUS (SEPSIS): 500.0000 mL | Freq: Once | INTRAVENOUS | Status: DC

## 2014-12-05 MED ORDER — ENOXAPARIN SODIUM 30 MG/0.3ML ~~LOC~~ SOLN
30.0000 mg | SUBCUTANEOUS | Status: DC
  Administered 2014-12-05: 30 mg via SUBCUTANEOUS
  Filled 2014-12-05: qty 0.3

## 2014-12-05 MED ORDER — POTASSIUM CHLORIDE CRYS ER 10 MEQ PO TBCR
10.0000 meq | EXTENDED_RELEASE_TABLET | ORAL | Status: DC
  Administered 2014-12-07: 10 meq via ORAL
  Filled 2014-12-05: qty 1

## 2014-12-05 MED ORDER — ENSURE ENLIVE PO LIQD
237.0000 mL | Freq: Every day | ORAL | Status: DC
  Administered 2014-12-06: 237 mL via ORAL

## 2014-12-05 MED ORDER — IPRATROPIUM BROMIDE 0.02 % IN SOLN
0.5000 mg | Freq: Four times a day (QID) | RESPIRATORY_TRACT | Status: DC
  Administered 2014-12-06 – 2014-12-08 (×9): 0.5 mg via RESPIRATORY_TRACT
  Filled 2014-12-05 (×9): qty 2.5

## 2014-12-05 MED ORDER — SODIUM CHLORIDE 0.9 % IV BOLUS (SEPSIS)
1000.0000 mL | INTRAVENOUS | Status: AC
  Administered 2014-12-05: 1000 mL via INTRAVENOUS

## 2014-12-05 MED ORDER — DOCUSATE SODIUM 100 MG PO CAPS
100.0000 mg | ORAL_CAPSULE | Freq: Every day | ORAL | Status: DC
  Administered 2014-12-06 – 2014-12-08 (×3): 100 mg via ORAL
  Filled 2014-12-05 (×3): qty 1

## 2014-12-05 MED ORDER — VANCOMYCIN HCL IN DEXTROSE 750-5 MG/150ML-% IV SOLN
750.0000 mg | INTRAVENOUS | Status: DC
  Administered 2014-12-06: 750 mg via INTRAVENOUS
  Filled 2014-12-05 (×3): qty 150

## 2014-12-05 MED ORDER — INFLUENZA VAC SPLIT QUAD 0.5 ML IM SUSY
0.5000 mL | PREFILLED_SYRINGE | INTRAMUSCULAR | Status: AC
  Administered 2014-12-07: 0.5 mL via INTRAMUSCULAR
  Filled 2014-12-05: qty 0.5

## 2014-12-05 MED ORDER — IPRATROPIUM BROMIDE 0.02 % IN SOLN: 0.5000 mg | Freq: Four times a day (QID) | RESPIRATORY_TRACT | Status: DC

## 2014-12-05 MED ORDER — SODIUM CHLORIDE 0.9 % IV BOLUS (SEPSIS)
500.0000 mL | INTRAVENOUS | Status: AC
  Administered 2014-12-05: 500 mL via INTRAVENOUS

## 2014-12-05 MED ORDER — PIPERACILLIN-TAZOBACTAM 3.375 G IVPB
3.3750 g | Freq: Three times a day (TID) | INTRAVENOUS | Status: DC
  Administered 2014-12-06: 3.375 g via INTRAVENOUS
  Filled 2014-12-05 (×8): qty 50

## 2014-12-05 MED ORDER — TAMSULOSIN HCL 0.4 MG PO CAPS
0.4000 mg | ORAL_CAPSULE | Freq: Every day | ORAL | Status: DC
  Administered 2014-12-06 – 2014-12-08 (×3): 0.4 mg via ORAL
  Filled 2014-12-05 (×3): qty 1

## 2014-12-05 NOTE — ED Notes (Signed)
PT from [facility] and was sent to ED d/t fever and abnormal increased WBC of 51.0 with dx of pneumonia yesterday and was started on Avelox po. PT c/o generalized weakness worsening today with productive cough.

## 2014-12-05 NOTE — Progress Notes (Signed)
PHARMACIST - PHYSICIAN ORDER COMMUNICATION  CONCERNING: P&T Medication Policy on Herbal Medications  DESCRIPTION:  This patient's order for:  Melotonin  has been noted.  This product(s) is classified as an "herbal" or natural product. Due to a lack of definitive safety studies or FDA approval, nonstandard manufacturing practices, plus the potential risk of unknown drug-drug interactions while on inpatient medications, the Pharmacy and Therapeutics Committee does not permit the use of "herbal" or natural products of this type within [facility].   ACTION TAKEN: The pharmacy department is unable to verify this order at this time and your patient has been informed of this safety policy. Please reevaluate patient's clinical condition at discharge and address if the herbal or natural product(s) should be resumed at that time.

## 2014-12-05 NOTE — Progress Notes (Addendum)
ANTIBIOTIC CONSULT NOTE - INITIAL  Pharmacy Consult for Vancomycin & renal adjustment antibiotics Indication: pneumonia  Allergies  Allergen Reactions  . Celebrex [Celecoxib]   . Codeine     Patient Measurements: Height: 5\' 11"  (180.3 cm) Weight: 168 lb (76.204 kg) IBW/kg (Calculated) : 75.3 Adjusted Body Weight:   Vital Signs: Temp: 98.2 F (36.8 C) (10/21 1443) Temp Source: Oral (10/21 1443) BP: 103/45 mmHg (10/21 1500) Pulse Rate: 91 (10/21 1500) Intake/Output from previous day:   Intake/Output from this shift:    Labs:  Recent Labs  12/05/14 0730 12/05/14 1010  WBC 51.0* 48.6*  HGB 11.2* 10.7*  PLT 225 234  CREATININE 1.58* 1.67*   Estimated Creatinine Clearance: 25.7 mL/min (by C-G formula based on Cr of 1.67). No results for input(s): VANCOTROUGH, VANCOPEAK, VANCORANDOM, GENTTROUGH, GENTPEAK, GENTRANDOM, TOBRATROUGH, TOBRAPEAK, TOBRARND, AMIKACINPEAK, AMIKACINTROU, AMIKACIN in the last 72 hours.   Microbiology: No results found for this or any previous visit (from the past 720 hour(s)).  Medical History: Past Medical History  Diagnosis Date  . Anxiety   . HTN (hypertension)   . Reflux   . DJD (degenerative joint disease), cervical   . Osteoarthritis   . Skin cancer, basal cell   . Diverticulitis   . Hyperlipidemia   . Inguinal hernia   . Ischemic heart disease   . Hiatal hernia   . Kidney stone   . Esophageal stricture   . History of recurrent TIAs   . Restless leg syndrome   . GERD (gastroesophageal reflux disease)   . Pleural effusion   . CHF (congestive heart failure) ([place])   . Urinary retention     Medications:   (Not in a hospital admission) Assessment: Patient from [facility], fever, elevated WBC. Avelox started yesterday at [facility]. Worsening today with productive cough Reduced renal function, CrCl 24 ml/min Zosyn 3.375 GM IV given in ED  Goal of Therapy:  Vancomycin trough level 15-20 mcg/ml  Plan:  Vancomycin 1 GM IV  x 1 dose, then Vancomycin 750 mg IV every 24 hours Vancomycin trough at steady state Zosyn 3.75 GM IV every 8 hours Monitor renal function Labs per protocol  Abner Greenspan, Ayerim Berquist Bennett 12/05/2014,3:34 PM

## 2014-12-05 NOTE — H&P (Signed)
[facility] History and Physical  Patrick [name] BCW:[id number] DOB: [birth year] DOA: 12/05/2014  Referring physician: ER PCP: Glenda Chroman., MD   Chief Complaint: Cough  HPI: Patrick [name] is a 81 y.o. male  This is a 81 year old man who gives a two-week history of persistent cough which has become productive in the last 2-3 days. He has had associated fevers the last 24-36 hours. He lives in a skilled nursing facility and blood work showed he had elevated white count yesterday and therefore has been sent to the emergency room. He feels weak and generally unwell. He does not complain of dyspnea, lightheadedness or any syncopal episodes. He denies any chest pain. Evaluation emergency room shows pneumonia by chest x-ray and also significant leukocytosis. He is now being admitted for further management.   Review of Systems:  Apart from symptoms above, all systems are negative.  Past Medical History  Diagnosis Date  . Anxiety   . HTN (hypertension)   . Reflux   . DJD (degenerative joint disease), cervical   . Osteoarthritis   . Skin cancer, basal cell   . Diverticulitis   . Hyperlipidemia   . Inguinal hernia   . Ischemic heart disease   . Hiatal hernia   . Kidney stone   . Esophageal stricture   . History of recurrent TIAs   . Restless leg syndrome   . GERD (gastroesophageal reflux disease)   . Pleural effusion   . CHF (congestive heart failure) ([place])   . Urinary retention   . DNR (do not resuscitate)    Past Surgical History  Procedure Laterality Date  . Intestines    . Exploratory laparotomy w/ bowel resection    . Esophageal dilation    . Fracture surgery    . Hemiarthroplasty hip     Social History:  reports that he has never smoked. He does not have any smokeless tobacco history on file. He reports that he does not drink alcohol or use illicit drugs.  Allergies  Allergen Reactions  . Celebrex [Celecoxib]   . Codeine     Family History  Problem  Relation Age of Onset  . Arthritis      Prior to Admission medications   Medication Sig Start Date End Date Taking? Authorizing Provider  aspirin EC 81 MG tablet Take 81 mg by mouth daily.   Yes Historical Provider, MD  bisacodyl (DULCOLAX) 10 MG suppository Place 10 mg rectally as needed for mild constipation or moderate constipation.   Yes Historical Provider, MD  Cholecalciferol (VITAMIN D) 2000 UNITS CAPS Take 1 capsule by mouth daily.   Yes Historical Provider, MD  docusate sodium (COLACE) 100 MG capsule Take 100 mg by mouth daily. For constipation.   Yes Historical Provider, MD  ENSURE (ENSURE) Take 237 mLs by mouth daily.   Yes Historical Provider, MD  furosemide (LASIX) 20 MG tablet Take 20 mg by mouth every other day. For edema.   Yes Historical Provider, MD  HYDROcodone-acetaminophen (NORCO/VICODIN) 5-325 MG per tablet Take one tablet by mouth four times a day as needed for pain DO NOT EXCEED 3 GM APAP IN 24 HOURS FROM ALL SOURCES 10/23/14  Yes Tiffany L Reed, DO  ipratropium (ATROVENT) 0.02 % nebulizer solution Take 0.5 mg by nebulization every 6 (six) hours.   Yes Historical Provider, MD  ipratropium-albuterol (DUONEB) 0.5-2.5 (3) MG/3ML SOLN Take 3 mLs by nebulization every 6 (six) hours as needed (shortness of breath).   Yes Historical Provider, MD  levalbuterol (XOPENEX) 0.63 MG/3ML nebulizer solution Take 0.63 mg by nebulization every 6 (six) hours.   Yes Historical Provider, MD  magnesium hydroxide (MILK OF MAGNESIA) 400 MG/5ML suspension Take 30 mLs by mouth daily as needed for mild constipation. If no relief try a Fleets enema, after 2 days.   Yes Historical Provider, MD  Melatonin 3 MG CAPS Take 3 mg by mouth at bedtime as needed (for sleep).   Yes Historical Provider, MD  omeprazole (PRILOSEC) 20 MG capsule Take 20 mg by mouth daily.   Yes Historical Provider, MD  OXYGEN Inhale 2 L into the lungs continuous.   Yes Historical Provider, MD  potassium chloride (K-DUR,KLOR-CON) 10  MEQ tablet Take 10 mEq by mouth every other day.    Yes Historical Provider, MD  rOPINIRole (REQUIP) 1 MG tablet Take 1.5 mg by mouth at bedtime.    Yes Historical Provider, MD  sertraline (ZOLOFT) 50 MG tablet Take 50 mg by mouth daily.     Yes Historical Provider, MD  tamsulosin (FLOMAX) 0.4 MG CAPS capsule Take 0.4 mg by mouth daily.   Yes Historical Provider, MD   Physical Exam: Filed Vitals:   12/05/14 1500 12/05/14 1536 12/05/14 1543 12/05/14 1600  BP: 103/45  123/67 117/64  Pulse: 91  85 84  Temp:  98.4 F (36.9 C)    TempSrc:  Rectal    Resp: 24  19 20   Height:      Weight:      SpO2: 95%  95% 95%    Wt Readings from Last 3 Encounters:  12/05/14 76.204 kg (168 lb)  03/08/13 77.111 kg (170 lb)  06/22/11 79.833 kg (176 lb)    General:  Appears calm and comfortable. He does not look toxic or septic. There is no increased work of breathing. He is alert and orientated. Eyes: PERRL, normal lids, irises & conjunctiva ENT: grossly normal hearing, lips & tongue Neck: no LAD, masses or thyromegaly Cardiovascular: RRR, no m/r/g. No LE edema. Telemetry: SR, no arrhythmias  Respiratory: Surprisingly, both lung fields appear to be clear clinically. There is no bronchial breathing, crackles or wheezing. Abdomen: soft, ntnd Skin: no rash or induration seen on limited exam Musculoskeletal: grossly normal tone BUE/BLE Psychiatric: grossly normal mood and affect, speech fluent and appropriate Neurologic: grossly non-focal.          Labs on Admission:  Basic Metabolic Panel:  Recent Labs Lab 12/05/14 0730 12/05/14 1010 12/05/14 1516  NA 137 134* 133*  K 4.4 4.4 4.2  CL 100* 99* 100*  CO2 26 22 24   GLUCOSE 110* 111* 154*  BUN 23* 24* 27*  CREATININE 1.58* 1.67* 1.79*  CALCIUM 8.8* 8.6* 8.4*   Liver Function Tests:  Recent Labs Lab 12/05/14 0730 12/05/14 1010 12/05/14 1516  AST 26 31 24   ALT 14* 14* 13*  ALKPHOS 95 93 85  BILITOT 0.8 0.9 0.7  PROT 7.3 6.8 6.7    ALBUMIN 3.3* 3.1* 3.0*   No results for input(s): LIPASE, AMYLASE in the last 168 hours. No results for input(s): AMMONIA in the last 168 hours. CBC:  Recent Labs Lab 12/05/14 0730 12/05/14 1010  WBC 51.0* 48.6*  NEUTROABS 45.2* 42.7*  HGB 11.2* 10.7*  HCT 33.5* 32.9*  MCV 89.6 89.6  PLT 225 234   Cardiac Enzymes:  Recent Labs Lab 12/05/14 1514  TROPONINI <0.03    BNP (last 3 results) No results for input(s): BNP in the last 8760 hours.  ProBNP (last 3 results)  No results for input(s): PROBNP in the last 8760 hours.  CBG: No results for input(s): GLUCAP in the last 168 hours.  Radiological Exams on Admission: Dg Chest 2 View  12/04/2014  CLINICAL DATA:  Chest congestion for 1 day EXAM: CHEST - 2 VIEW COMPARISON:  05/28/2014 FINDINGS: Cardiac shadow is stable. Mild aortic calcifications are again seen. The lungs demonstrate mild interstitial change bilaterally. Some early infiltrate is noted in the right lower lobe medially. No acute bony abnormality is seen. IMPRESSION: Mild right lower lobe infiltrate. Electronically Signed   By: Inez Catalina M.D.   On: 12/04/2014 20:25   Dg Chest Port 1 View  12/05/2014  CLINICAL DATA:  Cough 2-3 weeks. EXAM: PORTABLE CHEST 1 VIEW COMPARISON:  12/04/2014 and 05/28/2014 FINDINGS: Lordotic technique is used. Lungs are adequately inflated with persistent opacification over the medial right base which may be due to atelectasis or infection. Cardiomediastinal silhouette and remainder of the exam is unchanged. IMPRESSION: Persistent opacification over the medial right base which may be due to atelectasis or infection. Electronically Signed   By: Marin Olp M.D.   On: 12/05/2014 15:39      Assessment/Plan   1. Healthcare associated pneumonia. With broad-spectrum antibiotics. 2. Significant leukocytosis. White blood cell count differential indicates a neutrophilia. There is no lymphocytosis. Nonetheless, I will order a blood film.  Monitor CBC closely. I don't think he clinically has any sort of leukemia this is likely a leukemoid reaction.  He'll be admitted to the medical floor. Further recommendations will depend on patient's hospital progress.  Code Status: DO NOT RESUSCITATE. This was confirmed by the patient  DVT Prophylaxis: Lovenox.  Family Communication: I discussed the plan with the patient at the bedside.   Disposition Plan: Back to the skilled nursing facility when medically stable.  Time spent: 60 minutes.  Doree Albee [facility] Pager [phone].

## 2014-12-05 NOTE — ED Notes (Signed)
MD at bedside. 

## 2014-12-05 NOTE — ED Provider Notes (Signed)
CSN: [id number]     Arrival date & time 12/05/14  1439 History   First MD Initiated Contact with Patient 12/05/14 1512     Chief Complaint  Patient presents with  . Fever      HPI Pt was seen at 1520. Per EMS, NH report, pt and family, c/o gradual onset and worsening of persistent cough for the past 4 days. Has been associated with fevers and "elevated WBC count." NH gave pt tylenol PTA. Pt states he "just doesn't feel well." Denies CP/SOB, no abd pain, no N/V/D.   Pt and family both confirm pt is DNR (NH did not send paperwork). Past Medical History  Diagnosis Date  . Anxiety   . HTN (hypertension)   . Reflux   . DJD (degenerative joint disease), cervical   . Osteoarthritis   . Skin cancer, basal cell   . Diverticulitis   . Hyperlipidemia   . Inguinal hernia   . Ischemic heart disease   . Hiatal hernia   . Kidney stone   . Esophageal stricture   . History of recurrent TIAs   . Restless leg syndrome   . GERD (gastroesophageal reflux disease)   . Pleural effusion   . CHF (congestive heart failure) ([place])   . Urinary retention   . DNR (do not resuscitate)    Past Surgical History  Procedure Laterality Date  . Intestines    . Exploratory laparotomy w/ bowel resection    . Esophageal dilation    . Fracture surgery    . Hemiarthroplasty hip     Family History  Problem Relation Age of Onset  . Arthritis     Social History  Substance Use Topics  . Smoking status: Never Smoker   . Smokeless tobacco: None  . Alcohol Use: No    Review of Systems ROS: Statement: All systems negative except as marked or noted in the HPI; Constitutional: +fever and chills, "don't feel well."; ; Eyes: Negative for eye pain, redness and discharge. ; ; ENMT: Negative for ear pain, hoarseness, nasal congestion, sinus pressure and sore throat. ; ; Cardiovascular: Negative for chest pain, palpitations, diaphoresis, dyspnea and peripheral edema. ; ; Respiratory: +cough. Negative for wheezing and  stridor. ; ; Gastrointestinal: Negative for nausea, vomiting, diarrhea, abdominal pain, blood in stool, hematemesis, jaundice and rectal bleeding. . ; ; Genitourinary: Negative for dysuria, flank pain and hematuria. ; ; Musculoskeletal: Negative for back pain and neck pain. Negative for swelling and trauma.; ; Skin: Negative for pruritus, rash, abrasions, blisters, bruising and skin lesion.; ; Neuro: Negative for headache, lightheadedness and neck stiffness. Negative for weakness, altered level of consciousness , altered mental status, extremity weakness, paresthesias, involuntary movement, seizure and syncope.     Allergies  Celebrex and Codeine  Home Medications   Prior to Admission medications   Medication Sig Start Date End Date Taking? Authorizing Provider  aspirin EC 81 MG tablet Take 81 mg by mouth daily.    Historical Provider, MD  bisacodyl (DULCOLAX) 10 MG suppository Place 10 mg rectally as needed for mild constipation or moderate constipation.    Historical Provider, MD  docusate sodium (COLACE) 100 MG capsule Take 100 mg by mouth daily. For constipation.    Historical Provider, MD  furosemide (LASIX) 20 MG tablet Take 20 mg by mouth every other day. For edema.    Historical Provider, MD  HYDROcodone-acetaminophen (NORCO/VICODIN) 5-325 MG per tablet Take one tablet by mouth four times a day as needed for pain  DO NOT EXCEED 3 GM APAP IN 24 HOURS FROM ALL SOURCES 10/23/14   Tiffany L Reed, DO  ipratropium-albuterol (DUONEB) 0.5-2.5 (3) MG/3ML SOLN Take 3 mLs by nebulization every 6 (six) hours as needed (shortness of breath).    Historical Provider, MD  magnesium hydroxide (MILK OF MAGNESIA) 400 MG/5ML suspension Take 30 mLs by mouth daily as needed for mild constipation. If no relief try a Fleets enema, after 2 days.    Historical Provider, MD  Nutritional Supplements (ENSURE CLEAR) LIQD Take 200 mLs by mouth 2 (two) times daily.    Historical Provider, MD  omeprazole (PRILOSEC) 20 MG  capsule Take 20 mg by mouth daily.    Historical Provider, MD  potassium chloride (K-DUR,KLOR-CON) 10 MEQ tablet Take 10 mEq by mouth daily.    Historical Provider, MD  rOPINIRole (REQUIP) 1 MG tablet Take 1.5 mg by mouth at bedtime.     Historical Provider, MD  sertraline (ZOLOFT) 50 MG tablet Take 50 mg by mouth daily.      Historical Provider, MD  tamsulosin (FLOMAX) 0.4 MG CAPS capsule Take 0.4 mg by mouth daily.    Historical Provider, MD   BP 103/45 mmHg  Pulse 91  Temp(Src) 98.4 F (36.9 C) (Rectal)  Resp 24  Ht 5\' 11"  (1.803 m)  Wt 168 lb (76.204 kg)  BMI 23.44 kg/m2  SpO2 95%   Filed Vitals:   12/05/14 1443 12/05/14 1500 12/05/14 1536 12/05/14 1543  BP: 115/48 103/45  123/67  Pulse: 84 91  85  Temp: 98.2 F (36.8 C)  98.4 F (36.9 C)   TempSrc: Oral  Rectal   Resp: 20 24  19   Height: 5\' 11"  (1.803 m)     Weight: 168 lb (76.204 kg)     SpO2: 94% 95%  95%   Filed Vitals:   12/05/14 1536 12/05/14 1543 12/05/14 1600 12/05/14 1630  BP:  123/67 117/64 117/54  Pulse:  85 84 91  Temp: 98.4 F (36.9 C)   98 F (36.7 C)  TempSrc: Rectal   Oral  Resp:  19 20 24   Height:      Weight:      SpO2:  95% 95% 95%     15:43:25 Orthostatic Vital Signs RM  Orthostatic Lying  - BP- Lying: 110/52 mmHg ; Pulse- Lying: 83  Orthostatic Sitting - BP- Sitting: 123/67 mmHg ; Pulse- Sitting: 91  Orthostatic Standing at 0 minutes - BP- Standing at 0 minutes:  (patient unable to stand)       Physical Exam 1525: Physical examination:  Nursing notes reviewed; Vital signs and O2 SAT reviewed;  Constitutional: Well developed, Well nourished, In no acute distress; Head:  Normocephalic, atraumatic; Eyes: EOMI, PERRL, No scleral icterus; ENMT: Mouth and pharynx normal, Mucous membranes dry; Neck: Supple, Full range of motion, No lymphadenopathy; Cardiovascular: Regular rate and rhythm, No gallop; Respiratory: Breath sounds clear & equal bilaterally, No wheezes.  Speaking full sentences with  ease, Normal respiratory effort/excursion; Chest: Nontender, Movement normal; Abdomen: Soft, Nontender, Nondistended, Normal bowel sounds; Genitourinary: No CVA tenderness. +foley catheter in place with minimal dark urine in bag.; Extremities: Pulses normal, No tenderness, No edema.; Neuro: AA&Ox3, Major CN grossly intact.  Speech clear. +bilat LE's contracted per hx, otherwise moves bilat UE's spontaneously..; Skin: Color normal, Warm, Dry.    ED Course  Procedures (including critical care time) Labs Review   Imaging Review  I have personally reviewed and evaluated these images and lab results as part  of my medical decision-making.   EKG Interpretation   Date/Time:  Friday December 05 2014 14:58:57 EDT Ventricular Rate:  88 PR Interval:    QRS Duration: 121 QT Interval:  388 QTC Calculation: 469 R Axis:   67 Text Interpretation:  Normal sinus rhythm IVCD, consider atypical RBBB  Artifact When compared with ECG of 08/23/2013 No significant change was  found Confirmed by [facility]  MD, Nunzio Cory [phone]) on 12/05/2014 3:36:18 PM      MDM  MDM Reviewed: previous chart, nursing note and vitals Reviewed previous: labs, ECG and x-ray Interpretation: labs, ECG and x-ray Total time providing critical care: 30-74 minutes. This excludes time spent performing separately reportable procedures and services. Consults: admitting MD     CRITICAL CARE Performed by: Alfonzo Feller Total critical care time: 35 Critical care time was exclusive of separately billable procedures and treating other patients. Critical care was necessary to treat or prevent imminent or life-threatening deterioration. Critical care was time spent personally by me on the following activities: development of treatment plan with patient and/or surrogate as well as nursing, discussions with consultants, evaluation of patient's response to treatment, examination of patient, obtaining history from patient or surrogate,  ordering and performing treatments and interventions, ordering and review of laboratory studies, ordering and review of radiographic studies, pulse oximetry and re-evaluation of patient's condition.  Results for RAVEN, HARMES (MRN [id number]) as of 12/05/2014 15:38  Ref. Range 12/05/2014 07:30 12/05/2014 10:10  WBC Latest Ref Range: 4.0-10.5 K/uL 51.0 (HH) 48.6 (H)  RBC Latest Ref Range: 4.22-5.81 MIL/uL 3.74 (L) 3.67 (L)  Hemoglobin Latest Ref Range: 13.0-17.0 g/dL 11.2 (L) 10.7 (L)  HCT Latest Ref Range: 39.0-52.0 % 33.5 (L) 32.9 (L)  MCV Latest Ref Range: 78.0-100.0 fL 89.6 89.6  MCH Latest Ref Range: 26.0-34.0 pg 29.9 29.2  MCHC Latest Ref Range: 30.0-36.0 g/dL 33.4 32.5  RDW Latest Ref Range: 11.5-15.5 % 14.5 14.6  Platelets Latest Ref Range: 150-400 K/uL 225 234  Neutrophils Latest Units: % 89 88  Lymphocytes Latest Units: % 5 5  Monocytes Relative Latest Units: % 6 7  Eosinophil Latest Units: % 0 0  Basophil Latest Units: % 0 0  NEUT# Latest Ref Range: 1.7-7.7 K/uL 45.2 (H) 42.7 (H)  Lymphocyte # Latest Ref Range: 0.7-4.0 K/uL 2.6 2.5  Monocyte # Latest Ref Range: 0.1-1.0 K/uL 3.2 (H) 3.4 (H)  Eosinophils Absolute Latest Ref Range: 0.0-0.7 K/uL 0.0 0.0  Basophils Absolute Latest Ref Range: 0.0-0.1 K/uL 0.0 0.0   Results for ARSALAN, BRISBIN (MRN [id number]) as of 12/05/2014 15:38  Ref. Range 10/01/2014 07:15 11/12/2014 07:45 12/05/2014 07:30 12/05/2014 10:10  Sodium Latest Ref Range: 135-145 mmol/L 138 134 (L) 137 134 (L)  Potassium Latest Ref Range: 3.5-5.1 mmol/L 4.1 4.3 4.4 4.4  Chloride Latest Ref Range: 101-111 mmol/L 102 99 (L) 100 (L) 99 (L)  CO2 Latest Ref Range: 22-32 mmol/L 30 29 26 22   BUN Latest Ref Range: 6-20 mg/dL 11 13 23  (H) 24 (H)  Creatinine Latest Ref Range: 0.61-1.24 mg/dL 1.03 1.01 1.58 (H) 1.67 (H)      Dg Chest 2 View 12/04/2014  CLINICAL DATA:  Chest congestion for 1 day EXAM: CHEST - 2 VIEW COMPARISON:  05/28/2014 FINDINGS: Cardiac shadow is  stable. Mild aortic calcifications are again seen. The lungs demonstrate mild interstitial change bilaterally. Some early infiltrate is noted in the right lower lobe medially. No acute bony abnormality is seen. IMPRESSION: Mild right lower lobe infiltrate. Electronically Signed  By: Inez Catalina M.D.   On: 12/04/2014 20:25    Results for orders placed or performed during the hospital encounter of 12/05/14  Comprehensive metabolic panel  Result Value Ref Range   Sodium 133 (L) 135 - 145 mmol/L   Potassium 4.2 3.5 - 5.1 mmol/L   Chloride 100 (L) 101 - 111 mmol/L   CO2 24 22 - 32 mmol/L   Glucose, Bld 154 (H) 65 - 99 mg/dL   BUN 27 (H) 6 - 20 mg/dL   Creatinine, Ser 1.79 (H) 0.61 - 1.24 mg/dL   Calcium 8.4 (L) 8.9 - 10.3 mg/dL   Total Protein 6.7 6.5 - 8.1 g/dL   Albumin 3.0 (L) 3.5 - 5.0 g/dL   AST 24 15 - 41 U/L   ALT 13 (L) 17 - 63 U/L   Alkaline Phosphatase 85 38 - 126 U/L   Total Bilirubin 0.7 0.3 - 1.2 mg/dL   GFR calc non Af Amer 30 (L) >60 mL/min   GFR calc Af Amer 34 (L) >60 mL/min   Anion gap 9 5 - 15  CBC with Differential  Result Value Ref Range   WBC 38.7 (H) 4.0 - 10.5 K/uL   RBC 3.46 (L) 4.22 - 5.81 MIL/uL   Hemoglobin 10.2 (L) 13.0 - 17.0 g/dL   HCT 30.7 (L) 39.0 - 52.0 %   MCV 88.7 78.0 - 100.0 fL   MCH 29.5 26.0 - 34.0 pg   MCHC 33.2 30.0 - 36.0 g/dL   RDW 14.6 11.5 - 15.5 %   Platelets 215 150 - 400 K/uL   Neutrophils Relative % 90 %   Neutro Abs 34.7 (H) 1.7 - 7.7 K/uL   Lymphocytes Relative 4 %   Lymphs Abs 1.6 0.7 - 4.0 K/uL   Monocytes Relative 6 %   Monocytes Absolute 2.4 (H) 0.1 - 1.0 K/uL   Eosinophils Relative 0 %   Eosinophils Absolute 0.0 0.0 - 0.7 K/uL   Basophils Relative 0 %   Basophils Absolute 0.0 0.0 - 0.1 K/uL  Troponin I  Result Value Ref Range   Troponin I <0.03 <0.031 ng/mL  Urinalysis, Routine w reflex microscopic  Result Value Ref Range   Color, Urine YELLOW YELLOW   APPearance HAZY (A) CLEAR   Specific Gravity, Urine 1.010  1.005 - 1.030   pH >9.0 (H) 5.0 - 8.0   Glucose, UA NEGATIVE NEGATIVE mg/dL   Hgb urine dipstick LARGE (A) NEGATIVE   Bilirubin Urine NEGATIVE NEGATIVE   Ketones, ur NEGATIVE NEGATIVE mg/dL   Protein, ur 30 (A) NEGATIVE mg/dL   Urobilinogen, UA 0.2 0.0 - 1.0 mg/dL   Nitrite POSITIVE (A) NEGATIVE   Leukocytes, UA MODERATE (A) NEGATIVE  Urine microscopic-add on  Result Value Ref Range   Squamous Epithelial / LPF FEW (A) RARE   WBC, UA 21-50 <3 WBC/hpf   RBC / HPF 21-50 <3 RBC/hpf   Bacteria, UA MANY (A) RARE   Crystals TRIPLE PHOSPHATE CRYSTALS (A) NEGATIVE  I-Stat CG4 Lactic Acid, ED  Result Value Ref Range   Lactic Acid, Venous 3.35 (HH) 0.5 - 2.0 mmol/L     Dg Chest Port 1 View 12/05/2014  CLINICAL DATA:  Cough 2-3 weeks. EXAM: PORTABLE CHEST 1 VIEW COMPARISON:  12/04/2014 and 05/28/2014 FINDINGS: Lordotic technique is used. Lungs are adequately inflated with persistent opacification over the medial right base which may be due to atelectasis or infection. Cardiomediastinal silhouette and remainder of the exam is unchanged. IMPRESSION: Persistent opacification over  the medial right base which may be due to atelectasis or infection. Electronically Signed   By: Marin Olp M.D.   On: 12/05/2014 15:39    1550:  Labs and CXR obtained at [facility] reviewed (as above). BCx2 obtained at [facility]. UA/UC obtained in ED with lactic acid. Code Sepsis called: IVF bolus and IV abx for HCAP ordered (will also cover for UTI). BUN/Cr elevated from baseline; IVF given. Pt continues to mentate per baseline.   Dx and testing d/w pt and family.  Questions answered.  Verb understanding, agreeable to admit. T/C to [facility] Anastasio Champion, case discussed, including:  HPI, pertinent PM/SHx, VS/PE, dx testing, ED course and treatment:  Agreeable to admit, requests to write temporary orders, obtain medical bed to team APAdmits.   Francine Graven, DO 12/09/14 [phone]

## 2014-12-05 NOTE — Progress Notes (Signed)
Patient ID: Patrick [name], male   DOB: Jan 05, 1914, 81 y.o.   MRN: [id number]         this is an acute visit Level care skilled.  Facility [facility].   Chief complaint---- Acute visit secondary to elevated white count-low-grade fever-increased weakness  .  History of present illness.   Patient is a very pleasant 81 year old male who actually has been quite stable for some time he does have a chronic indwelling Foley catheter with a history of BPH also has also arthritis restless legs history of left femur fracture and at times a history of A. fib wound.  All these have stabilized.  Patient apparently developed some diaphoresis overnight and lab work was ordered chest ray did show positive for pneumonia--mild right lower lobe infiltrate Lab work was ordered which was significant for white count of over 50,000-this was repeated to check accuracy  did come back at 48,600.  Patient is increasingly weak according in nursing staff-we did do a rectal temp it was 100.7 otherwise vital signs stable blood pressure slightly reduced at 96/76.  Patient statesshe just doesn't feel very well and weak but otherwise does not complain of acute discomfort.        -     Previous medical history.  History of left femur fracture status post repair.  BPH.  Restless legs.  Depression.  History of cervical DJD.  Osteoarthritis.  History of basal cell carcinoma.  Diverticulosis.  Hyperlipidemia.  Right inguinal hernia.  Ischemic heart disease.  Hiatal hernia.  History of kidney stones.  History of esophageal stricture and dilation in the past.  Recurrent TIAs.  Recurrent diverticulitis and perforation and exploratory laparotomy and sigmoid resection in June 2011.  GERD.  Hypertension  .  Social history-patient apparently lived in an assisted living facility--he has a supportive son who lives in [place].  Has a very distant history of tobacco use almost 50 years ago no history of  alcohol abuse  .  Family history not pertinent.   Medication review per [facility] He is on aspirin enteric-coated 81 mg daily.  Colace 100 mg daily.  Vicodin 5-3 25 mg every 6 hours when necessary pain.  DuoNeb nebulizers every 6 hours when necessary.  Lasix 20 mg every other day.  Claritin 10 mg daily.  Melatonin 3 mg daily at bedtime when necessary.  Flomax 0.4 mg daily.  Zoloft 50 mg daily.  Potassium 10 mEq daily.  Prilosec 20 mg daily  Requip 1.5 mg daily at bedtime  Review of systems   In general complains of feeling weak and not well does not feel like he has a significant fever or any chills however-  Skin -- Did have recently's numerous skin lesions removed by dermatology or frozen-he had an area on the dorsum of his hand that was somewhat moist. When I last saw this appears to have dried up  Since than  .  Head ears eyes nose mouth and throat-does not complain of any sore throat  .  Respiratory no complaints of shortness of breath --has a cough--chest x-ray show pneumonia Cardiac- does have a history of coronary artery disease but does not complain of any chest pain --has some left leg edema  GI-does not complaining of abdominal pain nausea vomiting diarrhea constipation-- does have a history of an inguinal hernia.  GU-does not complaining of overt dysuria today  Muscle skeletal-says his hip pain joint pain is controlled .  Neurologic-does not complaining of any headache dizziness versus leg  symptoms apparently have improved-he is complaining of numbness as noted above.  Psych history of depression does not appear to be depressed -- .  Physical exam.    Temperature 100.7 rectal pulse 82 respirations 20 blood pressure 96/76.    In general this is a frail but quite alert and cognitively intact elderly male.--Appears somewhat weaker than his normal presentation  His skin is warm and dry--again had numerous precancerous lesions recently removed      Eyes  pupils appear equal round react to light sclera and conjunctiva are clear he has prescription lenses visual acuity appears grossly intact.   Oropharynx is clear mucous membranes moist--.  Chest he has decreased breath sounds at the bases --but has minimal chest congestion- no labored breathing.  Heart is regular rate and rhythm without murmur gallop or rub he appears to have some mild edema of his left leg-this appearsstable from previous exam    Abdomen is protuberant soft nontender he does have a well-healed surgical scar also note a right sided hernia--he does have bowel sounds in all 4 quadrants.   GU-the catheter draining amber colored urine Muscle  skeletal  Moves all extremities x4 largely ambulates in a wheelchair strength appears to be intact all extremities there is no significant pain with extension or flexion of his left hip  Capillary refill appears to be intact of his toes bilaterally is able to move his toes bilaterally thisdoes not appear to be painful but do not really see any concerning erythema or edema of his distal feet bilaterally ---  Neurologic-is grossly intact no focal deficits his speech is clear no lateralizing findings     .  Psych he is alert and oriented x3 pleasant and appropriate  .  Labs  12/05/2014.  WBC 48.6 hemoglobin 10.7 platelets 234.  Sodium 134 potassium 4.4 BUN 64 creatinine 1.67.  Albumin 3.1-ALT 14-otherwise liver function tests within normal limits  11/03/2014.  Magnesium 2.0.  Vitamin D 10.8.  10/01/2014.  Sodium 138 potassium 4.1 BUN 11 creatinine 1.03 CO2 30.  WBC 6.7 hemoglobin 11.7 platelets 174.  Liver function tests within normal limits except ALT slightly low at 13-albumin was low normal at 3.5  09/08/2014.  Creatinine 1.23-BUN 10.  08/29/2014.  Sodium 138 potassium 3.9 BUN 9 creatinine 1.14.  07/26/2014.  WBC 7.3 hemoglobin 10.8 platelets 211.  07/22/2014.  Albumin 2.9-AST 13-ALT 10-L, phosphatase 92    05/29/2014.  Sodium 137 potassium 4 BUN 14 creatinine 1.6.  Albumen 3.2 AST 60 otherwise liver function tests within normal limits.  WBC 10.2 hemoglobin 11.8 platelets 158  05/29/2014  Sodium 137 potassium 4 BUN 14 creatinine 1.00.  AST 60 otherwise liver function tests within normal limits.  WBC 10.2 hemoglobin 11.8 platelets 158.    01/27/2014.  WBC 6.5 hemoglobin 11.4 platelets 178.  Sodium 142 potassium 4.2 BUN 13 creatinine 1.03  .  12/05/2013.  Sodium 141 potassium 4.7 BUN 14 creatinine 1.06.  WBC 7.8 hemoglobin 11.9 platelets 190.     11/18/2013.  WBC 6.1 hemoglobin 12.0 platelets 174.  Sodium 143 potassium 4.2 BUN 11 creatinine 0.9 albumin 3.0 otherwise liver function tests within normal limits 09/11/2013.  WBC 6.4 hemoglobin 12.9 platelets 162.  Sodium 141 potassium 4.8 BUN 15 creatinine 0.9  08/22/2013.  TSH 1.599  .  Assessment and plan.  Significantly elevated leukocytosis-pneumonia-patient does appear increasingly weak and somewhat different from his baseline-he is a very fragile individual.  I cannot really appreciate any suggestion of abdominal issues diarrhea or  acute abdominal pain-nonetheless this significantly elevated white count is a concern Will send him to the ER for urgent evaluation.  [id number] note greater than 35 minutes spent assessing patient-assessing patient- reviewing his chart labs-discussing his status with nursing staff-and coordinating and formulating a plan of care-of note greater than 50% of time spent coordinating plan of care with nursing input as well as examination and speaking with patient     .       \

## 2014-12-05 NOTE — ED Notes (Signed)
Hospitalist at bedside 

## 2014-12-06 DIAGNOSIS — D649 Anemia, unspecified: Secondary | ICD-10-CM | POA: Diagnosis present

## 2014-12-06 DIAGNOSIS — N179 Acute kidney failure, unspecified: Secondary | ICD-10-CM | POA: Diagnosis present

## 2014-12-06 LAB — COMPREHENSIVE METABOLIC PANEL
ALK PHOS: 79 U/L (ref 38–126)
ALT: 11 U/L — AB (ref 17–63)
ANION GAP: 7 (ref 5–15)
AST: 23 U/L (ref 15–41)
Albumin: 2.7 g/dL — ABNORMAL LOW (ref 3.5–5.0)
BILIRUBIN TOTAL: 0.7 mg/dL (ref 0.3–1.2)
BUN: 22 mg/dL — ABNORMAL HIGH (ref 6–20)
CALCIUM: 8.1 mg/dL — AB (ref 8.9–10.3)
CO2: 25 mmol/L (ref 22–32)
CREATININE: 1.32 mg/dL — AB (ref 0.61–1.24)
Chloride: 105 mmol/L (ref 101–111)
GFR, EST AFRICAN AMERICAN: 50 mL/min — AB (ref >60)
GFR, EST NON AFRICAN AMERICAN: 43 mL/min — AB (ref >60)
Glucose, Bld: 107 mg/dL — ABNORMAL HIGH (ref 65–99)
Potassium: 3.8 mmol/L (ref 3.5–5.1)
SODIUM: 137 mmol/L (ref 135–145)
TOTAL PROTEIN: 6.1 g/dL — AB (ref 6.5–8.1)

## 2014-12-06 LAB — CBC WITH DIFFERENTIAL/PLATELET
BASOS ABS: 0 10*3/uL (ref 0.0–0.1)
BASOS PCT: 0 %
EOS ABS: 0 10*3/uL (ref 0.0–0.7)
Eosinophils Relative: 0 %
HCT: 29.6 % — ABNORMAL LOW (ref 39.0–52.0)
HEMOGLOBIN: 9.8 g/dL — AB (ref 13.0–17.0)
Lymphocytes Relative: 6 %
Lymphs Abs: 1.4 10*3/uL (ref 0.7–4.0)
MCH: 29.3 pg (ref 26.0–34.0)
MCHC: 33.1 g/dL (ref 30.0–36.0)
MCV: 88.4 fL (ref 78.0–100.0)
MONO ABS: 1.6 10*3/uL — AB (ref 0.1–1.0)
MONOS PCT: 7 %
NEUTROS PCT: 87 %
Neutro Abs: 20 10*3/uL — ABNORMAL HIGH (ref 1.7–7.7)
Platelets: 172 10*3/uL (ref 150–400)
RBC: 3.35 MIL/uL — ABNORMAL LOW (ref 4.22–5.81)
RDW: 14.4 % (ref 11.5–15.5)
WBC: 23 10*3/uL — ABNORMAL HIGH (ref 4.0–10.5)

## 2014-12-06 LAB — HIV ANTIBODY (ROUTINE TESTING W REFLEX): HIV Screen 4th Generation wRfx: NONREACTIVE

## 2014-12-06 MED ORDER — PIPERACILLIN-TAZOBACTAM 3.375 G IVPB
3.3750 g | Freq: Three times a day (TID) | INTRAVENOUS | Status: DC
  Administered 2014-12-06 – 2014-12-08 (×6): 3.375 g via INTRAVENOUS
  Filled 2014-12-06 (×10): qty 50

## 2014-12-06 MED ORDER — PIPERACILLIN-TAZOBACTAM 3.375 G IVPB
3.3750 g | Freq: Three times a day (TID) | INTRAVENOUS | Status: DC
  Filled 2014-12-06 (×7): qty 50

## 2014-12-06 MED ORDER — PIPERACILLIN-TAZOBACTAM 3.375 G IVPB
INTRAVENOUS | Status: AC
  Filled 2014-12-06: qty 100

## 2014-12-06 MED ORDER — POTASSIUM CHLORIDE IN NACL 20-0.9 MEQ/L-% IV SOLN
INTRAVENOUS | Status: DC
  Administered 2014-12-06 – 2014-12-07 (×2): via INTRAVENOUS

## 2014-12-06 MED ORDER — BENZONATATE 100 MG PO CAPS
100.0000 mg | ORAL_CAPSULE | Freq: Three times a day (TID) | ORAL | Status: DC
  Administered 2014-12-06 – 2014-12-08 (×6): 100 mg via ORAL
  Filled 2014-12-06 (×6): qty 1

## 2014-12-06 MED ORDER — ENOXAPARIN SODIUM 40 MG/0.4ML ~~LOC~~ SOLN
40.0000 mg | SUBCUTANEOUS | Status: DC
  Administered 2014-12-06: 40 mg via SUBCUTANEOUS
  Filled 2014-12-06: qty 0.4

## 2014-12-06 NOTE — Progress Notes (Signed)
[facility] PROGRESS NOTE  Patrick [name] DGU:[id number] DOB: 04-18-1913 DOA: 12/05/2014 PCP: Glenda Chroman., MD    Code Status: DO NOT RESUSCITATE Family Communication: Discussed with patient; family not available. Disposition Plan: Discharge back to SNF, likely in 2-3 days.   Consultants:  None  Procedures:  None  Antibiotics:  Vancomycin 10/21>>  Zosyn 10/21>>  HPI/Subjective: Patient complains of a cough, but he is unable to expectorate. He denies shortness of breath and chest pain at rest.  Objective: Filed Vitals:   12/06/14 0619  BP: 103/44  Pulse: 74  Temp: 98.3 F (36.8 C)  Resp: 18   oxygen saturation 92% on room air.  Intake/Output Summary (Last 24 hours) at 12/06/14 1156 Last data filed at 12/06/14 0853  Gross per 24 hour  Intake 2856.25 ml  Output   1700 ml  Net 1156.25 ml   Filed Weights   12/05/14 1443  Weight: 76.204 kg (168 lb)    Exam:   General:  Alert elderly man sitting up in bed, in no acute distress.  Cardiovascular: S1, S2, with soft systolic murmur.  Respiratory: Few scattered crackles; no wheezes; breathing nonlabored.  Abdomen: Positive bowel sounds, soft, nontender, nondistended.  GU: Indwelling Foley catheter in place, to draining yellow urine.  Musculoskeletal/extremities: No pedal edema. No acute hot red joints.  Neurologic: Elderly 81 year old man who is alert and oriented 2. Speech is clear. He is hard of hearing, otherwise cranial nerves appear to be grossly intact.   Data Reviewed: Basic Metabolic Panel:  Recent Labs Lab 12/05/14 0730 12/05/14 1010 12/05/14 1516 12/06/14 0550  NA 137 134* 133* 137  K 4.4 4.4 4.2 3.8  CL 100* 99* 100* 105  CO2 26 22 24 25   GLUCOSE 110* 111* 154* 107*  BUN 23* 24* 27* 22*  CREATININE 1.58* 1.67* 1.79* 1.32*  CALCIUM 8.8* 8.6* 8.4* 8.1*   Liver Function Tests:  Recent Labs Lab 12/05/14 0730 12/05/14 1010 12/05/14 1516 12/06/14 0550  AST 26 31 24 23    ALT 14* 14* 13* 11*  ALKPHOS 95 93 85 79  BILITOT 0.8 0.9 0.7 0.7  PROT 7.3 6.8 6.7 6.1*  ALBUMIN 3.3* 3.1* 3.0* 2.7*   No results for input(s): LIPASE, AMYLASE in the last 168 hours. No results for input(s): AMMONIA in the last 168 hours. CBC:  Recent Labs Lab 12/05/14 0730 12/05/14 1010 12/05/14 1516 12/06/14 0550  WBC 51.0* 48.6* 38.7* 23.0*  NEUTROABS 45.2* 42.7* 34.7* 20.0*  HGB 11.2* 10.7* 10.2* 9.8*  HCT 33.5* 32.9* 30.7* 29.6*  MCV 89.6 89.6 88.7 88.4  PLT 225 234 215 172   Cardiac Enzymes:  Recent Labs Lab 12/05/14 1514  TROPONINI <0.03   BNP (last 3 results) No results for input(s): BNP in the last 8760 hours.  ProBNP (last 3 results) No results for input(s): PROBNP in the last 8760 hours.  CBG: No results for input(s): GLUCAP in the last 168 hours.  Recent Results (from the past 240 hour(s))  Culture, blood (routine x 2)     Status: None (Preliminary result)   Collection Time: 12/05/14  7:30 AM  Result Value Ref Range Status   Specimen Description RIGHT ANTECUBITAL  Final   Special Requests BOTTLES DRAWN AEROBIC AND ANAEROBIC 10CC   Final   Culture NO GROWTH < 24 HOURS  Final   Report Status PENDING  Incomplete  Culture, blood (routine x 2)     Status: None (Preliminary result)   Collection Time: 12/05/14  7:35 AM  Result Value Ref Range Status   Specimen Description BLOOD RIGHT FOREARM  Final   Special Requests BOTTLES DRAWN AEROBIC AND ANAEROBIC 8CC EACH  Final   Culture NO GROWTH < 24 HOURS  Final   Report Status PENDING  Incomplete     Studies: Dg Chest 2 View  12/04/2014  CLINICAL DATA:  Chest congestion for 1 day EXAM: CHEST - 2 VIEW COMPARISON:  05/28/2014 FINDINGS: Cardiac shadow is stable. Mild aortic calcifications are again seen. The lungs demonstrate mild interstitial change bilaterally. Some early infiltrate is noted in the right lower lobe medially. No acute bony abnormality is seen. IMPRESSION: Mild right lower lobe infiltrate.  Electronically Signed   By: Inez Catalina M.D.   On: 12/04/2014 20:25   Dg Chest Port 1 View  12/05/2014  CLINICAL DATA:  Cough 2-3 weeks. EXAM: PORTABLE CHEST 1 VIEW COMPARISON:  12/04/2014 and 05/28/2014 FINDINGS: Lordotic technique is used. Lungs are adequately inflated with persistent opacification over the medial right base which may be due to atelectasis or infection. Cardiomediastinal silhouette and remainder of the exam is unchanged. IMPRESSION: Persistent opacification over the medial right base which may be due to atelectasis or infection. Electronically Signed   By: Marin Olp M.D.   On: 12/05/2014 15:39    Scheduled Meds: . aspirin EC  81 mg Oral Daily  . benzonatate  100 mg Oral TID  . cholecalciferol  2,000 Units Oral Daily  . docusate sodium  100 mg Oral Daily  . enoxaparin (LOVENOX) injection  30 mg Subcutaneous Q24H  . feeding supplement (ENSURE ENLIVE)  237 mL Oral Daily  . furosemide  20 mg Oral QODAY  . Influenza vac split quadrivalent PF  0.5 mL Intramuscular Tomorrow-1000  . ipratropium  0.5 mg Nebulization Q6H WA  . levalbuterol  0.63 mg Nebulization Q6H WA  . pantoprazole  40 mg Oral Daily  . piperacillin-tazobactam (ZOSYN)  IV  3.375 g Intravenous Q8H  . potassium chloride  10 mEq Oral QODAY  . rOPINIRole  1.5 mg Oral QHS  . sertraline  50 mg Oral Daily  . tamsulosin  0.4 mg Oral Daily  . vancomycin  750 mg Intravenous Q24H   Continuous Infusions: . sodium chloride 75 mL/hr at 12/06/14 0400  Assessment and Plan:  Principal Problem:   Healthcare-associated pneumonia Active Problems:   UTI (urinary tract infection)   Sepsis ([place])   AKI (acute kidney injury) ([place])   Restless legs   Normocytic anemia   1. Healthcare associated pneumonia. On admission, the patient was afebrile and was seemed dynamically stable. His chest x-ray revealed persistent opacification over the middle right base-atelectasis or infection. His white blood cell count was 51.0 on  admission; his lactic acid was elevated at 3.35. -Patient was started on vancomycin and Zosyn. We'll continue these for now. We'll continue Xopenex and Atrovent nebulizer. Will at the salon pearls for cough. -HIV negative. Blood cultures ordered and are negative to date. Urine Legionella and strep antigens ordered and is pending. -WBC is trending downward. Lactic acid has normalized.  Urinary tract infection associated with chronic Foley catheter. Patient's urinalysis was indicative of infection. He has a chronic Foley catheter for treatment of urinary retention per history. -Continue antibiotics above. Urine culture ordered and is pending.  Sepsis, secondary to healthcare associated pneumonia and UTI. His blood pressure has improved. Continue treatment of infections and IV fluid hydration.  Acute kidney injury. Patient's renal function was within normal limits one month ago. On admission, it  was 1.79, likely secondary to prerenal azotemia and ATN from infections. IV fluids started. His renal function has improved progressively.  Normocytic anemia. The patient probably has anemia of chronic disease. His hemoglobin was 10.2 on admission and has fallen to 9.8. This is likely secondary to dilutional effects of IV fluids rather than GI or GU blood loss. His vitamin B12 level was within normal limits 3 weeks ago. We'll continue to monitor.  Mild hyperglycemia. Patient has no reported history of diabetes. He may have prediabetes. Hemoglobin A1c 3 weeks ago was 6.1. We'll continue to monitor.   Time spent: 35 minutes.    [place] [facility] Pager [phone] If 7PM-7AM, please contact night-coverage at [email or web address], password [facility] 12/06/2014, 11:56 AM  LOS: 1 day

## 2014-12-07 DIAGNOSIS — T83511A Infection and inflammatory reaction due to indwelling urethral catheter, initial encounter: Secondary | ICD-10-CM | POA: Diagnosis not present

## 2014-12-07 LAB — CBC
HEMATOCRIT: 30.7 % — AB (ref 39.0–52.0)
HEMOGLOBIN: 10 g/dL — AB (ref 13.0–17.0)
MCH: 29.1 pg (ref 26.0–34.0)
MCHC: 32.6 g/dL (ref 30.0–36.0)
MCV: 89.2 fL (ref 78.0–100.0)
Platelets: 198 10*3/uL (ref 150–400)
RBC: 3.44 MIL/uL — AB (ref 4.22–5.81)
RDW: 14.4 % (ref 11.5–15.5)
WBC: 11.1 10*3/uL — ABNORMAL HIGH (ref 4.0–10.5)

## 2014-12-07 LAB — BASIC METABOLIC PANEL
Anion gap: 7 (ref 5–15)
BUN: 14 mg/dL (ref 6–20)
CHLORIDE: 106 mmol/L (ref 101–111)
CO2: 23 mmol/L (ref 22–32)
Calcium: 8.3 mg/dL — ABNORMAL LOW (ref 8.9–10.3)
Creatinine, Ser: 1.15 mg/dL (ref 0.61–1.24)
GFR calc Af Amer: 59 mL/min — ABNORMAL LOW (ref >60)
GFR calc non Af Amer: 51 mL/min — ABNORMAL LOW (ref >60)
GLUCOSE: 94 mg/dL (ref 65–99)
Potassium: 4.8 mmol/L (ref 3.5–5.1)
Sodium: 136 mmol/L (ref 135–145)

## 2014-12-07 NOTE — Progress Notes (Signed)
[facility] PROGRESS NOTE  Patrick [name] EXN:[id number] DOB: 1913-10-08 DOA: 12/05/2014 PCP: Glenda Chroman., MD    Code Status: DO NOT RESUSCITATE Family Communication: Discussed with patient; family not available. Disposition Plan: Discharge back to SNF, likely in 1-2 days.   Consultants:  None  Procedures:  None  Antibiotics:  Vancomycin 10/21>>  Zosyn 10/21>>  HPI/Subjective: Patient says he hurts all over from arthritis. His cough is better with the addition of Tessalon Perles.  Objective: Filed Vitals:   12/07/14 1504  BP: 131/67  Pulse: 69  Temp: 98.2 F (36.8 C)  Resp: 16   oxygen saturation 95%  Intake/Output Summary (Last 24 hours) at 12/07/14 1842 Last data filed at 12/07/14 1838  Gross per 24 hour  Intake    960 ml  Output   3300 ml  Net  -2340 ml   Filed Weights   12/05/14 1443  Weight: 76.204 kg (168 lb)    Exam:   General:  Alert elderly man sitting up in bed, in no acute distress.  Cardiovascular: S1, S2, with soft systolic murmur.  Respiratory: Occasional crackles; no wheezes; breathing nonlabored.  Abdomen: Positive bowel sounds, soft, nontender, nondistended.  GU: Indwelling Foley catheter in place, to draining yellow urine.  Musculoskeletal/extremities: No pedal edema. No acute hot red joints.  Neurologic: Nonfocal. He is alert and oriented to himself and place. Speech is clear.   Data Reviewed: Basic Metabolic Panel:  Recent Labs Lab 12/05/14 0730 12/05/14 1010 12/05/14 1516 12/06/14 0550 12/07/14 0641  NA 137 134* 133* 137 136  K 4.4 4.4 4.2 3.8 4.8  CL 100* 99* 100* 105 106  CO2 26 22 24 25 23   GLUCOSE 110* 111* 154* 107* 94  BUN 23* 24* 27* 22* 14  CREATININE 1.58* 1.67* 1.79* 1.32* 1.15  CALCIUM 8.8* 8.6* 8.4* 8.1* 8.3*   Liver Function Tests:  Recent Labs Lab 12/05/14 0730 12/05/14 1010 12/05/14 1516 12/06/14 0550  AST 26 31 24 23   ALT 14* 14* 13* 11*  ALKPHOS 95 93 85 79  BILITOT 0.8  0.9 0.7 0.7  PROT 7.3 6.8 6.7 6.1*  ALBUMIN 3.3* 3.1* 3.0* 2.7*   No results for input(s): LIPASE, AMYLASE in the last 168 hours. No results for input(s): AMMONIA in the last 168 hours. CBC:  Recent Labs Lab 12/05/14 0730 12/05/14 1010 12/05/14 1516 12/06/14 0550 12/07/14 0641  WBC 51.0* 48.6* 38.7* 23.0* 11.1*  NEUTROABS 45.2* 42.7* 34.7* 20.0*  --   HGB 11.2* 10.7* 10.2* 9.8* 10.0*  HCT 33.5* 32.9* 30.7* 29.6* 30.7*  MCV 89.6 89.6 88.7 88.4 89.2  PLT 225 234 215 172 198   Cardiac Enzymes:  Recent Labs Lab 12/05/14 1514  TROPONINI <0.03   BNP (last 3 results) No results for input(s): BNP in the last 8760 hours.  ProBNP (last 3 results) No results for input(s): PROBNP in the last 8760 hours.  CBG: No results for input(s): GLUCAP in the last 168 hours.  Recent Results (from the past 240 hour(s))  Culture, blood (routine x 2)     Status: None (Preliminary result)   Collection Time: 12/05/14  7:30 AM  Result Value Ref Range Status   Specimen Description RIGHT ANTECUBITAL  Final   Special Requests BOTTLES DRAWN AEROBIC AND ANAEROBIC 10CC   Final   Culture NO GROWTH 2 DAYS  Final   Report Status PENDING  Incomplete  Culture, blood (routine x 2)     Status: None (Preliminary result)   Collection Time: 12/05/14  7:35 AM  Result Value Ref Range Status   Specimen Description BLOOD RIGHT FOREARM  Final   Special Requests BOTTLES DRAWN AEROBIC AND ANAEROBIC [place]  Final   Culture NO GROWTH 2 DAYS  Final   Report Status PENDING  Incomplete  Urine culture     Status: None (Preliminary result)   Collection Time: 12/05/14  3:40 PM  Result Value Ref Range Status   Specimen Description URINE, CLEAN CATCH  Final   Special Requests NONE  Final   Culture   Final    >=100,000 COLONIES/mL GRAM NEGATIVE RODS Performed at [facility]    Report Status PENDING  Incomplete     Studies: No results found.  Scheduled Meds: . aspirin EC  81 mg Oral Daily  .  benzonatate  100 mg Oral TID  . cholecalciferol  2,000 Units Oral Daily  . docusate sodium  100 mg Oral Daily  . enoxaparin (LOVENOX) injection  40 mg Subcutaneous Q24H  . feeding supplement (ENSURE ENLIVE)  237 mL Oral Daily  . furosemide  20 mg Oral QODAY  . ipratropium  0.5 mg Nebulization Q6H WA  . levalbuterol  0.63 mg Nebulization Q6H WA  . pantoprazole  40 mg Oral Daily  . piperacillin-tazobactam (ZOSYN)  IV  3.375 g Intravenous Q8H  . potassium chloride  10 mEq Oral QODAY  . rOPINIRole  1.5 mg Oral QHS  . sertraline  50 mg Oral Daily  . tamsulosin  0.4 mg Oral Daily  . vancomycin  750 mg Intravenous Q24H   Continuous Infusions: . 0.9 % NaCl with KCl 20 mEq / L 75 mL/hr at 12/06/14 1406  Assessment and Plan:  Principal Problem:   Healthcare-associated pneumonia Active Problems:   UTI (urinary tract infection)   Sepsis ([place])   AKI (acute kidney injury) ([place])   Restless legs   Normocytic anemia   1. Healthcare associated pneumonia. On admission, the patient was afebrile and was seemed dynamically stable. His chest x-ray revealed persistent opacification over the middle right base-atelectasis or infection. His white blood cell count was 51.0 on admission; his lactic acid was elevated at 3.35. -Patient was started on vancomycin and Zosyn. We'll continue these for now. We'll continue Xopenex and Atrovent nebulizer. Will continue Tessalon Perles for cough. -HIV negative. Blood cultures ordered and are negative to date. Urine Legionella and strep antigens ordered but apparently not sent yet. -WBC is trending downward. Lactic acid has normalized. Urinary tract infection associated with chronic Foley catheter. Patient's urinalysis was indicative of infection. He has a chronic Foley catheter for treatment of urinary retention per history. Urine culture is growing greater than 100,000 colonies of gram-negative rods. Await identification. -Continue antibiotics above.  Sepsis,  secondary to healthcare associated pneumonia and UTI. His blood pressure has improved. Continue treatment of infections and IV fluid hydration; will decrease IV fluids. Acute kidney injury. Patient's renal function was within normal limits one month ago. On admission, it was 1.79, likely secondary to prerenal azotemia and ATN from infections. IV fluids were started. His renal function has improved progressively. Will decrease IV fluids here at Normocytic anemia. The patient probably has anemia of chronic disease. His hemoglobin was 10.2 on admission and has fallen to 9.8. This is likely secondary to dilutional effects of IV fluids rather than GI or GU blood loss. His vitamin B12 level was within normal limits 3 weeks ago. We'll continue to monitor.  Mild hyperglycemia. Patient has no reported history of diabetes. He may  have prediabetes. Hemoglobin A1c 3 weeks ago was 6.1. His CBGs in the hospital has trended toward within normal limits. We'll continue to monitor.  DJD. Continue as needed Vicodin for pain.   Time spent: 30 minutes.    [facility] Pager [phone] If 7PM-7AM, please contact night-coverage at [email or web address], password [facility] 12/07/2014, 6:42 PM  LOS: 2 days

## 2014-12-08 ENCOUNTER — Inpatient Hospital Stay
Admission: RE | Admit: 2014-12-08 | Discharge: 2015-04-12 | Disposition: A | Discharge status: Nursing Home (Includes ICF) | Payer: BLUE CROSS/BLUE SHIELD | Source: Ambulatory Visit | Attending: Internal Medicine | Admitting: Internal Medicine

## 2014-12-08 DIAGNOSIS — I259 Chronic ischemic heart disease, unspecified: Secondary | ICD-10-CM | POA: Diagnosis not present

## 2014-12-08 DIAGNOSIS — N390 Urinary tract infection, site not specified: Secondary | ICD-10-CM

## 2014-12-08 DIAGNOSIS — F324 Major depressive disorder, single episode, in partial remission: Secondary | ICD-10-CM | POA: Diagnosis not present

## 2014-12-08 DIAGNOSIS — B351 Tinea unguium: Secondary | ICD-10-CM | POA: Diagnosis not present

## 2014-12-08 DIAGNOSIS — E785 Hyperlipidemia, unspecified: Secondary | ICD-10-CM | POA: Diagnosis not present

## 2014-12-08 DIAGNOSIS — D649 Anemia, unspecified: Secondary | ICD-10-CM

## 2014-12-08 DIAGNOSIS — L97511 Non-pressure chronic ulcer of other part of right foot limited to breakdown of skin: Secondary | ICD-10-CM | POA: Diagnosis not present

## 2014-12-08 DIAGNOSIS — I5032 Chronic diastolic (congestive) heart failure: Secondary | ICD-10-CM | POA: Diagnosis not present

## 2014-12-08 DIAGNOSIS — N400 Enlarged prostate without lower urinary tract symptoms: Secondary | ICD-10-CM | POA: Diagnosis not present

## 2014-12-08 DIAGNOSIS — M25551 Pain in right hip: Secondary | ICD-10-CM

## 2014-12-08 DIAGNOSIS — M25561 Pain in right knee: Secondary | ICD-10-CM

## 2014-12-08 DIAGNOSIS — R1314 Dysphagia, pharyngoesophageal phase: Secondary | ICD-10-CM | POA: Diagnosis not present

## 2014-12-08 DIAGNOSIS — M79672 Pain in left foot: Secondary | ICD-10-CM | POA: Diagnosis not present

## 2014-12-08 DIAGNOSIS — F339 Major depressive disorder, recurrent, unspecified: Secondary | ICD-10-CM | POA: Diagnosis not present

## 2014-12-08 DIAGNOSIS — R278 Other lack of coordination: Secondary | ICD-10-CM | POA: Diagnosis not present

## 2014-12-08 DIAGNOSIS — J189 Pneumonia, unspecified organism: Secondary | ICD-10-CM

## 2014-12-08 DIAGNOSIS — I509 Heart failure, unspecified: Secondary | ICD-10-CM | POA: Diagnosis not present

## 2014-12-08 DIAGNOSIS — M6281 Muscle weakness (generalized): Secondary | ICD-10-CM | POA: Diagnosis not present

## 2014-12-08 DIAGNOSIS — N179 Acute kidney failure, unspecified: Secondary | ICD-10-CM | POA: Diagnosis not present

## 2014-12-08 DIAGNOSIS — K4090 Unilateral inguinal hernia, without obstruction or gangrene, not specified as recurrent: Secondary | ICD-10-CM | POA: Diagnosis not present

## 2014-12-08 DIAGNOSIS — K5790 Diverticulosis of intestine, part unspecified, without perforation or abscess without bleeding: Secondary | ICD-10-CM | POA: Diagnosis not present

## 2014-12-08 DIAGNOSIS — G2581 Restless legs syndrome: Secondary | ICD-10-CM | POA: Diagnosis not present

## 2014-12-08 DIAGNOSIS — Z9181 History of falling: Secondary | ICD-10-CM | POA: Diagnosis not present

## 2014-12-08 DIAGNOSIS — A419 Sepsis, unspecified organism: Secondary | ICD-10-CM

## 2014-12-08 DIAGNOSIS — M869 Osteomyelitis, unspecified: Secondary | ICD-10-CM | POA: Diagnosis not present

## 2014-12-08 DIAGNOSIS — I10 Essential (primary) hypertension: Secondary | ICD-10-CM | POA: Diagnosis not present

## 2014-12-08 DIAGNOSIS — G629 Polyneuropathy, unspecified: Secondary | ICD-10-CM | POA: Diagnosis not present

## 2014-12-08 DIAGNOSIS — G4700 Insomnia, unspecified: Secondary | ICD-10-CM | POA: Diagnosis not present

## 2014-12-08 DIAGNOSIS — N10 Acute tubulo-interstitial nephritis: Secondary | ICD-10-CM | POA: Diagnosis not present

## 2014-12-08 DIAGNOSIS — K219 Gastro-esophageal reflux disease without esophagitis: Secondary | ICD-10-CM | POA: Diagnosis not present

## 2014-12-08 DIAGNOSIS — M1990 Unspecified osteoarthritis, unspecified site: Secondary | ICD-10-CM | POA: Diagnosis not present

## 2014-12-08 DIAGNOSIS — S91109D Unspecified open wound of unspecified toe(s) without damage to nail, subsequent encounter: Secondary | ICD-10-CM | POA: Diagnosis not present

## 2014-12-08 DIAGNOSIS — L97521 Non-pressure chronic ulcer of other part of left foot limited to breakdown of skin: Secondary | ICD-10-CM | POA: Diagnosis not present

## 2014-12-08 DIAGNOSIS — R634 Abnormal weight loss: Secondary | ICD-10-CM | POA: Diagnosis not present

## 2014-12-08 DIAGNOSIS — M179 Osteoarthritis of knee, unspecified: Secondary | ICD-10-CM | POA: Diagnosis not present

## 2014-12-08 DIAGNOSIS — M25579 Pain in unspecified ankle and joints of unspecified foot: Secondary | ICD-10-CM | POA: Diagnosis not present

## 2014-12-08 DIAGNOSIS — M79671 Pain in right foot: Principal | ICD-10-CM

## 2014-12-08 DIAGNOSIS — F329 Major depressive disorder, single episode, unspecified: Secondary | ICD-10-CM | POA: Diagnosis not present

## 2014-12-08 LAB — BASIC METABOLIC PANEL
ANION GAP: 7 (ref 5–15)
BUN: 12 mg/dL (ref 6–20)
CALCIUM: 8.5 mg/dL — AB (ref 8.9–10.3)
CO2: 23 mmol/L (ref 22–32)
Chloride: 104 mmol/L (ref 101–111)
Creatinine, Ser: 1.21 mg/dL (ref 0.61–1.24)
GFR, EST AFRICAN AMERICAN: 55 mL/min — AB (ref >60)
GFR, EST NON AFRICAN AMERICAN: 48 mL/min — AB (ref >60)
Glucose, Bld: 100 mg/dL — ABNORMAL HIGH (ref 65–99)
POTASSIUM: 4.5 mmol/L (ref 3.5–5.1)
SODIUM: 134 mmol/L — AB (ref 135–145)

## 2014-12-08 LAB — CBC
HEMATOCRIT: 32.4 % — AB (ref 39.0–52.0)
HEMOGLOBIN: 10.7 g/dL — AB (ref 13.0–17.0)
MCH: 29.1 pg (ref 26.0–34.0)
MCHC: 33 g/dL (ref 30.0–36.0)
MCV: 88 fL (ref 78.0–100.0)
Platelets: 229 10*3/uL (ref 150–400)
RBC: 3.68 MIL/uL — ABNORMAL LOW (ref 4.22–5.81)
RDW: 14.1 % (ref 11.5–15.5)
WBC: 10.5 10*3/uL (ref 4.0–10.5)

## 2014-12-08 LAB — URINE CULTURE: Culture: >=100000

## 2014-12-08 LAB — STREP PNEUMONIAE URINARY ANTIGEN: STREP PNEUMO URINARY ANTIGEN: NEGATIVE

## 2014-12-08 MED ORDER — BENZONATATE 100 MG PO CAPS: 100.0000 mg | ORAL_CAPSULE | Freq: Three times a day (TID) | ORAL | Status: DC

## 2014-12-08 MED ORDER — LEVOFLOXACIN 750 MG PO TABS: 750.0000 mg | ORAL_TABLET | Freq: Every day | ORAL | Status: DC

## 2014-12-08 NOTE — Progress Notes (Signed)
Pt rested well with no complaints of pain voiced.

## 2014-12-08 NOTE — Care Management Important Message (Signed)
Important Message  Patient Details  Name: VINCE AINSLEY MRN: [id number] Date of Birth: 05/12/1913   Medicare Important Message Given:  Yes-second notification given    Sherald Barge, RN 12/08/2014, 11:32 AM

## 2014-12-08 NOTE — Care Management Note (Signed)
Case Management Note  Patient Details  Name: DOIS JUARBE MRN: [id number] Date of Birth: September 04, 1913  Subjective/Objective:                  Pt admitted with HCAP. From [facility].   Action/Plan: Plan for return to SNF today. CSW is aware and will arrange for return to facility. No CM needs noted.   Expected Discharge Date:    12/08/2014              Expected Discharge Plan:  Skilled Nursing Facility  In-House Referral:  Clinical Social Work  Discharge planning Services  CM Consult  Post Acute Care Choice:  NA Choice offered to:  NA  DME Arranged:    DME Agency:     HH Arranged:    [place] Agency:     Status of Service:  Completed, signed off  Medicare Important Message Given:  Yes-second notification given Date Medicare IM Given:    Medicare IM give by:    Date Additional Medicare IM Given:    Additional Medicare Important Message give by:     If discussed at [place] of Stay Meetings, dates discussed:    Additional Comments:  Sherald Barge, RN 12/08/2014, 11:32 AM

## 2014-12-08 NOTE — Clinical Social Work Note (Signed)
Clinical Social Work Assessment  Patient Details  Name: Patrick [name] MRN: [id number] Date of Birth: 1913/11/14  Date of referral:  12/08/14               Reason for consult:  Facility Placement                Permission sought to share information with:    Permission granted to share information::     Name::     Enid Derry  Agency::     Relationship::  daughter  Contact Information:     Housing/Transportation Living arrangements for the past 2 months:  [place] of Information:  Patient Patient Interpreter Needed:  None Criminal Activity/Legal Involvement Pertinent to Current Situation/Hospitalization:  No - Comment as needed Significant Relationships:  Other Family Members Lives with:  Facility Resident Do you feel safe going back to the place where you live?  Yes Need for family participation in patient care:  Yes (Comment)  Care giving concerns:  Pt is long term resident at [facility].    Social Worker assessment / plan:  CSW met with pt at bedside. Pt alert and oriented. He has been a resident at [facility] for about 2 years. Pt's daughter, Enid Derry lives out of town, but is involved and supportive. He has a niece who visits frequently. Pt reports he enjoys being at [facility] and Enid Derry indicates things are going well. They are aware of d/c and are agreeable. Pt to transfer with staff. Per Tami at [facility], pt is nursing level of care and okay to return. He requires assist with all ADLs and uses a wheelchair.   Employment status:  Retired Forensic scientist:  Medicare PT Recommendations:  Not assessed at this time [place] / Referral to community resources:  [place]  Patient/Family's Response to care:  Pt and family request return to [facility] at d/c.   Patient/Family's Understanding of and Emotional Response to Diagnosis, Current Treatment, and Prognosis:  Pt and daughter indicate they are aware of admission diagnosis and are happy pt is ready for return to  [facility]. Enid Derry states she visited pt in hospital yesterday.   Emotional Assessment Appearance:  Appears stated age Attitude/Demeanor/Rapport:  Other (Cooperative) Affect (typically observed):  Appropriate Orientation:  Oriented to Self, Oriented to Place, Oriented to  Time, Oriented to Situation Alcohol / Substance use:  Not Applicable Psych involvement (Current and /or in the community):  No (Comment)  Discharge Needs  Concerns to be addressed:  Discharge Planning Concerns Readmission within the last 30 days:  No Current discharge risk:  None Barriers to Discharge:  Continued Medical Work up   [employer], [employer], [place] 12/08/2014, 12:00 PM [phone]

## 2014-12-08 NOTE — Discharge Summary (Signed)
Physician Discharge Summary  Patrick [name] GEX:[id number] DOB: 1913-06-22 DOA: 12/05/2014  PCP: Glenda Chroman., MD  Admit date: 12/05/2014 Discharge date: 12/08/2014  Time spent: 25 minutes  Recommendations for Outpatient Follow-up:  1. Discharge to [facility].   Discharge Diagnoses:  Principal Problem:   Healthcare-associated pneumonia Active Problems:   Restless legs   UTI (urinary tract infection)   Sepsis ([place])   AKI (acute kidney injury) ([place])   Normocytic anemia   Discharge Condition: Improved  Diet recommendation: Heart healthy   Filed Weights   12/05/14 1443  Weight: 76.204 kg (168 lb)    History of present illness:  72 yom [facility] resident [facility] presented with complaints of two week persistent cough that worsen and became productive within 2 days prior to admission. Was noted to have elevated WBC and fever at SNF and sent to ED for admission. Evaluation in the ED revealed PNA by CXR and significant leukocytosis. Admitted for treatment of PNA.   Hospital Course:  CXR revealed persistent opacification over the middle right base-atelectasis or infection. Initially started on vancomycin and Zosyn, which was transitioned to oral Levaquin upon discharge. He remained afebrile during admission and WBC normalized with treatment. Blood cultures, urine legionella, and strep antigens pending. He is breathing comfortably on room air. Will complete his course of oral abx. 1. Proteus UTI associated with chronic foley, present on admission. Infection noted on UA. Urine culture is growing proteus. He is being adequately treated with abx.  2. Sepsis secondary to HCAP and UTI. Blood pressure improved. Transitioned to oral Levaquin. Blood cultures show no growth to date. He is afebrile and leukocytosis has resolved. Clinically he appears improved.   3. AKI, resolved with IV hydration. likely secondary to prerenal azotemia and ATN from infections. 4. Normocytic  anemia, improving. No evidence of active bleeding. Likely related to IV hydration vs GI or GU blood loss.  5. DJD. Continue Vicodin for pain as PRN.  Procedures:  None  Consultations:  None   Discharge Exam: Filed Vitals:   12/08/14 0639  BP: 146/57  Pulse: 73  Temp: 99.4 F (37.4 C)  Resp: 20   6. General: NAD, looks comfortable 7. Cardiovascular: RRR, S1, S2  8. Respiratory: clear bilaterally, No wheezing, rales or rhonchi 9. Abdomen: soft, non tender, no distention , bowel sounds normal 10. Musculoskeletal: No edema b/l  Discharge Instructions    Current Discharge Medication List    CONTINUE these medications which have NOT CHANGED   Details  aspirin EC 81 MG tablet Take 81 mg by mouth daily.    bisacodyl (DULCOLAX) 10 MG suppository Place 10 mg rectally as needed for mild constipation or moderate constipation.    Cholecalciferol (VITAMIN D) 2000 UNITS CAPS Take 1 capsule by mouth daily.    docusate sodium (COLACE) 100 MG capsule Take 100 mg by mouth daily. For constipation.    ENSURE (ENSURE) Take 237 mLs by mouth daily.    furosemide (LASIX) 20 MG tablet Take 20 mg by mouth every other day. For edema.    HYDROcodone-acetaminophen (NORCO/VICODIN) 5-325 MG per tablet Take one tablet by mouth four times a day as needed for pain DO NOT EXCEED 3 GM APAP IN 24 HOURS FROM ALL SOURCES Qty: 120 tablet, Refills: 0    ipratropium (ATROVENT) 0.02 % nebulizer solution Take 0.5 mg by nebulization every 6 (six) hours.    ipratropium-albuterol (DUONEB) 0.5-2.5 (3) MG/3ML SOLN Take 3 mLs by nebulization every 6 (six) hours as  needed (shortness of breath).    levalbuterol (XOPENEX) 0.63 MG/3ML nebulizer solution Take 0.63 mg by nebulization every 6 (six) hours.    magnesium hydroxide (MILK OF MAGNESIA) 400 MG/5ML suspension Take 30 mLs by mouth daily as needed for mild constipation. If no relief try a Fleets enema, after 2 days.    Melatonin 3 MG CAPS Take 3 mg by mouth  at bedtime as needed (for sleep).    omeprazole (PRILOSEC) 20 MG capsule Take 20 mg by mouth daily.    OXYGEN Inhale 2 L into the lungs continuous.    potassium chloride (K-DUR,KLOR-CON) 10 MEQ tablet Take 10 mEq by mouth every other day.     rOPINIRole (REQUIP) 1 MG tablet Take 1.5 mg by mouth at bedtime.     sertraline (ZOLOFT) 50 MG tablet Take 50 mg by mouth daily.      tamsulosin (FLOMAX) 0.4 MG CAPS capsule Take 0.4 mg by mouth daily.       Allergies  Allergen Reactions  . Celebrex [Celecoxib]   . Codeine       The results of significant diagnostics from this hospitalization (including imaging, microbiology, ancillary and laboratory) are listed below for reference.    Significant Diagnostic Studies: Dg Chest 2 View  12/04/2014  CLINICAL DATA:  Chest congestion for 1 day EXAM: CHEST - 2 VIEW COMPARISON:  05/28/2014 FINDINGS: Cardiac shadow is stable. Mild aortic calcifications are again seen. The lungs demonstrate mild interstitial change bilaterally. Some early infiltrate is noted in the right lower lobe medially. No acute bony abnormality is seen. IMPRESSION: Mild right lower lobe infiltrate. Electronically Signed   By: Inez Catalina M.D.   On: 12/04/2014 20:25   Dg Chest Port 1 View  12/05/2014  CLINICAL DATA:  Cough 2-3 weeks. EXAM: PORTABLE CHEST 1 VIEW COMPARISON:  12/04/2014 and 05/28/2014 FINDINGS: Lordotic technique is used. Lungs are adequately inflated with persistent opacification over the medial right base which may be due to atelectasis or infection. Cardiomediastinal silhouette and remainder of the exam is unchanged. IMPRESSION: Persistent opacification over the medial right base which may be due to atelectasis or infection. Electronically Signed   By: Marin Olp M.D.   On: 12/05/2014 15:39    Microbiology: Recent Results (from the past 240 hour(s))  Culture, blood (routine x 2)     Status: None (Preliminary result)   Collection Time: 12/05/14  7:30 AM   Result Value Ref Range Status   Specimen Description RIGHT ANTECUBITAL  Final   Special Requests BOTTLES DRAWN AEROBIC AND ANAEROBIC 10CC   Final   Culture NO GROWTH 3 DAYS  Final   Report Status PENDING  Incomplete  Culture, blood (routine x 2)     Status: None (Preliminary result)   Collection Time: 12/05/14  7:35 AM  Result Value Ref Range Status   Specimen Description BLOOD RIGHT FOREARM  Final   Special Requests BOTTLES DRAWN AEROBIC AND ANAEROBIC [place]  Final   Culture NO GROWTH 3 DAYS  Final   Report Status PENDING  Incomplete  Urine culture     Status: None   Collection Time: 12/05/14  3:40 PM  Result Value Ref Range Status   Specimen Description URINE, CLEAN CATCH  Final   Special Requests NONE  Final   Culture   Final    >=100,000 COLONIES/mL PROTEUS MIRABILIS Performed at [facility]    Report Status 12/08/2014 FINAL  Final   Organism ID, Bacteria PROTEUS MIRABILIS  Final  Susceptibility   Proteus mirabilis - MIC*    AMPICILLIN 8 SENSITIVE Sensitive     CEFAZOLIN <=4 SENSITIVE Sensitive     CEFTRIAXONE <=1 SENSITIVE Sensitive     CIPROFLOXACIN >=4 RESISTANT Resistant     GENTAMICIN 8 INTERMEDIATE Intermediate     IMIPENEM 4 SENSITIVE Sensitive     NITROFURANTOIN 128 RESISTANT Resistant     TRIMETH/SULFA >=320 RESISTANT Resistant     AMPICILLIN/SULBACTAM 8 SENSITIVE Sensitive     PIP/TAZO <=4 SENSITIVE Sensitive     * >=100,000 COLONIES/mL PROTEUS MIRABILIS     Labs: Basic Metabolic Panel:  Recent Labs Lab 12/05/14 1010 12/05/14 1516 12/06/14 0550 12/07/14 0641 12/08/14 0606  NA 134* 133* 137 136 134*  K 4.4 4.2 3.8 4.8 4.5  CL 99* 100* 105 106 104  CO2 22 24 25 23 23   GLUCOSE 111* 154* 107* 94 100*  BUN 24* 27* 22* 14 12  CREATININE 1.67* 1.79* 1.32* 1.15 1.21  CALCIUM 8.6* 8.4* 8.1* 8.3* 8.5*   Liver Function Tests:  Recent Labs Lab 12/05/14 0730 12/05/14 1010 12/05/14 1516 12/06/14 0550  AST 26 31 24 23   ALT 14* 14* 13*  11*  ALKPHOS 95 93 85 79  BILITOT 0.8 0.9 0.7 0.7  PROT 7.3 6.8 6.7 6.1*  ALBUMIN 3.3* 3.1* 3.0* 2.7*   CBC:  Recent Labs Lab 12/05/14 0730 12/05/14 1010 12/05/14 1516 12/06/14 0550 12/07/14 0641 12/08/14 0606  WBC 51.0* 48.6* 38.7* 23.0* 11.1* 10.5  NEUTROABS 45.2* 42.7* 34.7* 20.0*  --   --   HGB 11.2* 10.7* 10.2* 9.8* 10.0* 10.7*  HCT 33.5* 32.9* 30.7* 29.6* 30.7* 32.4*  MCV 89.6 89.6 88.7 88.4 89.2 88.0  PLT 225 234 215 172 198 229   Cardiac Enzymes:  Recent Labs Lab 12/05/14 1514  TROPONINI <0.03     Signed:  Kathie Dike, MD  [facility] 12/08/2014, 11:15 AM   By signing my name below, I, Rennis Harding, attest that this documentation has been prepared under the direction and in the presence of Kathie Dike, MD. Electronically signed: Rennis Harding, Scribe. 12/08/2014 11:15 AM  I, Dr. Kathie Dike, personally performed the services described in this documentaiton. All medical record entries made by the scribe were at my direction and in my presence. I have reviewed the chart and agree that the record reflects my personal performance and is accurate and complete  Kathie Dike, MD, 12/08/2014 11:31 AM

## 2014-12-08 NOTE — Progress Notes (Signed)
Report called to Inez Catalina at the [facility].  Will continue to monitor until transported to [facility].

## 2014-12-09 ENCOUNTER — Non-Acute Institutional Stay (SKILLED_NURSING_FACILITY): Payer: Medicare Other | Admitting: Internal Medicine

## 2014-12-09 ENCOUNTER — Other Ambulatory Visit: Payer: Self-pay | Admitting: *Deleted

## 2014-12-09 ENCOUNTER — Encounter: Payer: Self-pay | Admitting: Internal Medicine

## 2014-12-09 DIAGNOSIS — N179 Acute kidney failure, unspecified: Secondary | ICD-10-CM | POA: Diagnosis not present

## 2014-12-09 DIAGNOSIS — N10 Acute tubulo-interstitial nephritis: Secondary | ICD-10-CM

## 2014-12-09 DIAGNOSIS — J189 Pneumonia, unspecified organism: Secondary | ICD-10-CM

## 2014-12-09 DIAGNOSIS — N400 Enlarged prostate without lower urinary tract symptoms: Secondary | ICD-10-CM | POA: Diagnosis not present

## 2014-12-09 DIAGNOSIS — I509 Heart failure, unspecified: Secondary | ICD-10-CM | POA: Diagnosis not present

## 2014-12-09 MED ORDER — HYDROCODONE-ACETAMINOPHEN 5-325 MG PO TABS: ORAL_TABLET | ORAL | Status: DC

## 2014-12-09 NOTE — Progress Notes (Signed)
Patient ID: Patrick [name], male   DOB: 30-Dec-1913, 81 y.o.   MRN: [id number]   This is an acute visit.  Level care skilled.  Facility [facility].  Chief complaint acute visit status post hospitalization for leukocytosis thought secondary to pneumonia-UTI.  History of present illness.  Patient is a very pleasant 81 year old male Y saw last week after lab work revealed an significantly elevated white count of over 50,000-patient had some increased weakness although he did not appear to be unstable-he was sent to the ER for concerns of the weakness with elevated white count-and was thought to have pneumonia by chest x-ray.  He initially was started on vancomycin and Zosyn this has been transitioned to oral Levaquin.  WBC normalized with treatment with cultures apparently were negative-she was found to have Proteus UTI as well.  Leukocytosis did resolve he has been afebrile and is back here at the facility.  He was also felt to have acute kidney injury as well that resolved with IV hydration.  .  Currently he does complain of feeling weak but otherwise has no complaints she is afebrile appears to be relatively at his baseline although possibly a bit weaker he is eating lunch currently and appears to have a fairly decent appetite  Is not complaining of any increased shortness of breath or increased cough  Previous medical history.  History of left femur fracture status post repair.  BPH.  Restless legs.  Depression.  History of cervical DJD.  Osteoarthritis.  Osteomyelitis lower extremity.  History of basal cell carcinoma.  Diverticulosis.  Hyperlipidemia.  Right inguinal hernia.  Ischemic heart disease.  Hiatal hernia.  History of kidney stones.  History is esophageal stricture and dilation in the past.  Recurrent TIAs.  Recurrent diverticulitis and perforation and exploratory laparotomy and sigmoid resection in June  2011.  GERD.  Hypertension.  Social history-patient previously lived in an assisted living facility today he has a very supportive son who lives in [place] distant history of tobacco use almost 50 years ago no history of alcohol abuse.  Family history is not pertinent.  Medications  Aspirin enteric-coated 81 mg daily.  Ducolax rectally and milligrams daily when necessary.  Vitamin D 2000 units daily.  Colace 100 mg daily.  Ensure daily.  Lasix 20 mg every other day.  Vicodin 5-3 25 mg-4 times a day when necessary pain.  DuoNeb nebulizers every 6 hours when necessary.  Milk of magnesia when necessary.  Melatonin 3 mg daily at bedtime when necessary.  Prilosec 20 mg daily.  Oxygen as needed via nasal cannula.  Potassium chloride 10 mEq every other day.  Requip 1.5 mg daily at bedtime.  Zoloft 50 mg daily.  Flomax 0.4 mg daily   Review of systems.  In general does not complain of any fever or chills does say he is somewhat weak.  Skin does not complain of any rashes or itching.  Eyes ears nose mouth and throat does not complain of visual changes or sore throat.  Respiratory does not complain of shortness of breath nursing staff has not really noted a significantly increased cough.  Cardiac does not complain of any chest pain palpitations--   GI is not complaining of any abdominal pain nausea vomiting diarrhea or constipation appears to have a fairly decent appetite.  GU is not complaining of dysuria does have a chronic indwelling Foley catheter.  Muscle skeletal other than weakness does not really complain of joint pain.  Neurologic is not complaining of  dizziness headache syncopal-type feelings.  Psych-has a history depression on Zoloft-actually after exam today he later approached me and stated a didn't think he had much to live for any more-he did not express any specific plan to harm himself just did not think he had much live for any  more-nursing and I did have a fairly extensive discussion with him-again he did not have any specific plan to harm himself but he will need psychiatric follow-up Despite that --he is presently engaging in conversation with nursing staff laughing appears to be in good humor   Physical exam.  Temperature is 98.9 pulse 80 respirations 24 blood pressure 100/61 in general this is a very pleasant elderly male who looks young than his stated age.  His skin is warm and dry he does have known numerous solar induced changes and skin cancer removal sites these appear to be stable  Oropharynx is clear mucous membranes appear fairly moist.  Eyes pupils appear reactive to light sclera and conjunctiva are clear visual acuity appears grossly intact.  Chest shallow air entry but I could not really appreciate overt congestion there is no labored breathing.  Heart is regular rate and rhythm without murmur gallop or rub-has slight edema of his left leg.  This is baseline.  Abdomen is protuberant nontender he has a well-healed surgical scar and a right sided hernia which appears to be unchanged-she does have positive bowel sounds.  GU he has a chronic indwelling catheter draining amber colored urine.  Musculoskeletal is able to move all extremities 4 has some lower extremity weakness which is not new up her extremity strength appears preserved he ambulates in a wheelchair without difficulty.  Neurologic is grossly intact to speech is clear no lateralizing findings.  Psych he is largely alert and oriented-again did express to [place] what appears to be some depression symptoms although he still pleasant conversant and engaged  Labs.  12/08/2014.  Sodium 134 potassium 4.5 BUN 12 creatinine 1.21.  12/06/2014.  AST 23-ALT 11-S1 phosphatase 79-bilirubin 0.7-albumin 2.7.  12/08/2014.  WBC 10.5-hemoglobin 10.7-platelets 229.   Assessment and plan.  #1 suspected pneumonia-he is completing a course of  Levaquin this appears to be stable clinically I could not really appreciate much cough today no complaint shortness of breath he is receiving duo nebs as needed as well he has [employer] ordered as well although again I do not really see much of a cough today--.  #2 history of Proteus UTI this was thought to be adequately treated with antibiotics in the hospital he is finishing course of Levaquin as noted above.  #3 acute kidney injury this resolved with IV hydration most recent creatinine 1.21 BUN of 12 will update this next week.  #4 normocytic anemia apparently this improved in the hospital with no evidence of bleeding this is thought secondary to IV hydration--appears to be stable with HBG 10.7  #5-CHF this appears to be baseline on low-dose Lasix every other day with potassium supplementation Will update metabolic panel again next week.  #6-history of restless leg since appears to be stable on Requip.  #7 history BPH she continues on Flomax this is stable he does have an indwelling Foley catheter.  #8-history constipation this appears to be stable on current medications.  #9 history depression-as noted above patient did express some depressive symptoms to me later today as well as to nursing-will need a psychiatric consult again he does not express any  specific plan to harm himself--at this point monitor closely  he continues to be pleasant and engaged--he is on Zoloft  [id number]-of note greater than 35 minutes spent assessing patient-discussing his status with nursing staff-reviewing his chart-and coordinating and formulating a plan of care for numerous diagnoses-of note greater than 50% of time spent coordinating plan of care

## 2014-12-09 NOTE — Telephone Encounter (Signed)
[facility] 

## 2014-12-10 ENCOUNTER — Non-Acute Institutional Stay (SKILLED_NURSING_FACILITY): Payer: Medicare Other | Admitting: Internal Medicine

## 2014-12-10 DIAGNOSIS — R634 Abnormal weight loss: Secondary | ICD-10-CM

## 2014-12-10 DIAGNOSIS — J181 Lobar pneumonia, unspecified organism: Principal | ICD-10-CM

## 2014-12-10 DIAGNOSIS — IMO0002 Reserved for concepts with insufficient information to code with codable children: Secondary | ICD-10-CM

## 2014-12-10 DIAGNOSIS — M179 Osteoarthritis of knee, unspecified: Secondary | ICD-10-CM | POA: Diagnosis not present

## 2014-12-10 DIAGNOSIS — J189 Pneumonia, unspecified organism: Secondary | ICD-10-CM | POA: Diagnosis not present

## 2014-12-10 DIAGNOSIS — F324 Major depressive disorder, single episode, in partial remission: Secondary | ICD-10-CM | POA: Diagnosis not present

## 2014-12-10 DIAGNOSIS — M1710 Unilateral primary osteoarthritis, unspecified knee: Secondary | ICD-10-CM

## 2014-12-10 LAB — CULTURE, BLOOD (ROUTINE X 2)
CULTURE: NO GROWTH
Culture: NO GROWTH

## 2014-12-11 DIAGNOSIS — G4700 Insomnia, unspecified: Secondary | ICD-10-CM | POA: Diagnosis not present

## 2014-12-11 DIAGNOSIS — F329 Major depressive disorder, single episode, unspecified: Secondary | ICD-10-CM | POA: Diagnosis not present

## 2014-12-14 DIAGNOSIS — R509 Fever, unspecified: Secondary | ICD-10-CM | POA: Insufficient documentation

## 2014-12-14 DIAGNOSIS — D72829 Elevated white blood cell count, unspecified: Secondary | ICD-10-CM | POA: Insufficient documentation

## 2014-12-15 ENCOUNTER — Encounter (HOSPITAL_COMMUNITY)
Admission: RE | Admit: 2014-12-15 | Discharge: 2014-12-15 | Disposition: A | Discharge status: Home / Self Care | Payer: Medicare Other | Source: Skilled Nursing Facility | Attending: Internal Medicine | Admitting: Internal Medicine

## 2014-12-15 DIAGNOSIS — I259 Chronic ischemic heart disease, unspecified: Secondary | ICD-10-CM | POA: Diagnosis not present

## 2014-12-15 LAB — COMPREHENSIVE METABOLIC PANEL
ALK PHOS: 88 U/L (ref 38–126)
ALT: 12 U/L — AB (ref 17–63)
AST: 18 U/L (ref 15–41)
Albumin: 3 g/dL — ABNORMAL LOW (ref 3.5–5.0)
Anion gap: 3 — ABNORMAL LOW (ref 5–15)
BILIRUBIN TOTAL: 0.3 mg/dL (ref 0.3–1.2)
BUN: 11 mg/dL (ref 6–20)
CALCIUM: 8.2 mg/dL — AB (ref 8.9–10.3)
CHLORIDE: 104 mmol/L (ref 101–111)
CO2: 27 mmol/L (ref 22–32)
CREATININE: 0.86 mg/dL (ref 0.61–1.24)
Glucose, Bld: 96 mg/dL (ref 65–99)
Potassium: 3.9 mmol/L (ref 3.5–5.1)
Sodium: 134 mmol/L — ABNORMAL LOW (ref 135–145)
TOTAL PROTEIN: 6.7 g/dL (ref 6.5–8.1)

## 2014-12-15 LAB — CBC WITH DIFFERENTIAL/PLATELET
BASOS ABS: 0 10*3/uL (ref 0.0–0.1)
Basophils Relative: 0 %
Eosinophils Absolute: 0.3 10*3/uL (ref 0.0–0.7)
Eosinophils Relative: 4 %
HEMATOCRIT: 31.3 % — AB (ref 39.0–52.0)
HEMOGLOBIN: 10.2 g/dL — AB (ref 13.0–17.0)
LYMPHS ABS: 1.4 10*3/uL (ref 0.7–4.0)
LYMPHS PCT: 20 %
MCH: 28.8 pg (ref 26.0–34.0)
MCHC: 32.6 g/dL (ref 30.0–36.0)
MCV: 88.4 fL (ref 78.0–100.0)
Monocytes Absolute: 0.8 10*3/uL (ref 0.1–1.0)
Monocytes Relative: 12 %
NEUTROS ABS: 4.4 10*3/uL (ref 1.7–7.7)
Neutrophils Relative %: 64 %
Platelets: 221 10*3/uL (ref 150–400)
RBC: 3.54 MIL/uL — AB (ref 4.22–5.81)
RDW: 13.8 % (ref 11.5–15.5)
WBC: 6.9 10*3/uL (ref 4.0–10.5)

## 2014-12-17 NOTE — Progress Notes (Addendum)
Patient ID: Patrick [name], male   DOB: 1913/03/03, 81 y.o.   MRN: [id number]                HISTORY & PHYSICAL  DATE:  12/10/2014          FACILITY: [facility]                LEVEL OF CARE:   SNF   CHIEF COMPLAINT:  Readmission to the facility, post stay at [facility], 12/05/2014 through 12/08/2014.    HISTORY OF PRESENT ILLNESS:  This is an elderly man who has been a long-term resident of this building.  He is largely wheelchair-bound, coming to [place] from [facility] in January 2015.  He was admitted at that point after suffering a left femoral neck fracture.    He apparently has not been well in the facility for some time, although I was not aware that he was seen by our service, however, and diagnosed with pneumonia.  He was put on Levaquin.  By the time he got to the emergency room, his white count  was 51,000 with 89% neutrophils.     He was also found to have Proteus in a chronically catheterized bladder.  This was quinolone resistant, although he was discharged on Levaquin.    His blood cultures were negative.  Nevertheless, his leukocytosis resolved.    He had acute kidney injury which resolved with IV hydration, felt to be secondary to prerenal azotemia and ATN from infection.    I reviewed his admission chest x-ray.  This showed a probable small area of pneumonia in the right middle lobe with indistinctiveness of the right heart border.  However, for a white count of 51,000, I would have expected a little more in the way of findings.  Nevertheless, he has done fairly well.    I am seeing him today for readmission to the facility.    Past Medical History  Diagnosis Date  . Anxiety   . HTN (hypertension)   . Reflux   . DJD (degenerative joint disease), cervical   . Osteoarthritis   . Skin cancer, basal cell   . Diverticulitis   . Hyperlipidemia   . Inguinal hernia   . Ischemic heart disease   . Hiatal hernia   . Kidney stone   . Esophageal  stricture   . History of recurrent TIAs   . Restless leg syndrome   . GERD (gastroesophageal reflux disease)   . Pleural effusion   . CHF (congestive heart failure) ([place])   . Urinary retention   . DNR (do not resuscitate)     CURRENT MEDICATIONS:  Medication list is reviewed.           ASA 81 q.d.      Vitamin D 2000 U daily.    Colace 100 q.d.     Ensure 1 can daily.     Lasix 20 q.d.     Hydrocodone 5/325 daily.    Atrovent nebulizers every 6 hours.     Albuterol nebulizers every 6 hours p.r.n.      Xopenex every 6 hours.     Prilosec 20 q.d.     KCl 10 mEq q.d.     Requip 1.5 mg at bedtime (restless legs syndrome).    Zoloft 50 mg q.d. (depression).     Flomax 0.4 daily.     REVIEW OF SYSTEMS:       GENERAL:  The patient states he  is not in any distress.    CHEST/RESPIRATORY:  No cough.  No sputum.    CARDIAC:  No chest pain.    GI:  No abdominal pain.  No diarrhea.   GU:  He has a chronic Foley catheter here secondary to BPH.  He is not complaining of lower pelvic pain or back pain.      MUSCULOSKELETAL:  Extremities:  Widespread osteoarthritis, largely accounting for his nonambulatory status.     PHYSICAL EXAMINATION:   GENERAL APPEARANCE:  The patient is not in any distress, although he looks like he has lost weight.    HEENT:   MOUTH/THROAT:    Oral exam is normal.    CHEST/RESPIRATORY:  Very marked decreased air entry at the bases of his lungs.  There is no wheezing.        CARDIOVASCULAR:   CARDIAC:  Heart sounds are normal.  He does not appear to be dehydrated.  There are no murmurs.  No carotid bruits.     GASTROINTESTINAL:   ABDOMEN:  Multiple surgical scars.   HERNIA:  Ventral hernia.    LIVER/SPLEEN/KIDNEY:   No liver, no spleen.  No tenderness.    GENITOURINARY:   BLADDER:  No suprapubic or costovertebral angle tenderness.    CIRCULATION:   EDEMA/VARICOSITIES:  Extremities:  No evidence of a DVT.   SKIN:   INSPECTION:  I did not review  his wound today.     ASSESSMENT/PLAN:                Right middle lobe pneumonia.   Once again, not very impressive for a white count  of 51,000.  He was treated initially with vancomycin and Zosyn, then transitioned to oral Levaquin at discharge.     Proteus in a catheterized urine specimen.   I am not sure whether this man was symptomatic or not.    ?Weight loss.  I will need to follow up on this.    Depression.   He is on Zoloft and was being followed by Psychiatry in the facility.     Lower extremity wounds.  I will need to see this next time I am in the building.    History of diverticulitis.  As far as I know, this is asymptomatic.    Osteoarthritis: generalized. I think he is largely imoboized secondary to this.   I will remain a bit vigilant with his respiratory status, especially with the lung bases being very abnormal and a lot more impressive at the bedside than the look of his chest x-ray.  I am a little skeptical, given his 51,000 white count, although he seems to have responded to antibiotics and there was not an obvious additional source.

## 2014-12-26 DIAGNOSIS — G629 Polyneuropathy, unspecified: Secondary | ICD-10-CM | POA: Diagnosis not present

## 2014-12-26 DIAGNOSIS — B351 Tinea unguium: Secondary | ICD-10-CM | POA: Diagnosis not present

## 2014-12-26 DIAGNOSIS — L97511 Non-pressure chronic ulcer of other part of right foot limited to breakdown of skin: Secondary | ICD-10-CM | POA: Diagnosis not present

## 2014-12-26 DIAGNOSIS — L97521 Non-pressure chronic ulcer of other part of left foot limited to breakdown of skin: Secondary | ICD-10-CM | POA: Diagnosis not present

## 2014-12-29 ENCOUNTER — Encounter (HOSPITAL_COMMUNITY)
Admission: RE | Admit: 2014-12-29 | Discharge: 2014-12-29 | Disposition: A | Discharge status: Home / Self Care | Payer: Medicare Other | Source: Skilled Nursing Facility | Attending: Internal Medicine | Admitting: Internal Medicine

## 2014-12-29 DIAGNOSIS — N400 Enlarged prostate without lower urinary tract symptoms: Secondary | ICD-10-CM | POA: Insufficient documentation

## 2014-12-29 LAB — URINE MICROSCOPIC-ADD ON

## 2014-12-29 LAB — URINALYSIS, ROUTINE W REFLEX MICROSCOPIC
Bilirubin Urine: NEGATIVE
Glucose, UA: NEGATIVE mg/dL
Ketones, ur: NEGATIVE mg/dL
Nitrite: NEGATIVE
PROTEIN: NEGATIVE mg/dL
Specific Gravity, Urine: 1.015 (ref 1.005–1.030)
UROBILINOGEN UA: 0.2 mg/dL (ref 0.0–1.0)
pH: 8 (ref 5.0–8.0)

## 2014-12-31 ENCOUNTER — Ambulatory Visit (HOSPITAL_COMMUNITY)
Admit: 2014-12-31 | Discharge: 2014-12-31 | Disposition: A | Discharge status: Home / Self Care | Payer: No Typology Code available for payment source | Source: Ambulatory Visit | Attending: Internal Medicine | Admitting: Internal Medicine

## 2014-12-31 ENCOUNTER — Non-Acute Institutional Stay (SKILLED_NURSING_FACILITY): Payer: Medicare Other | Admitting: Internal Medicine

## 2014-12-31 ENCOUNTER — Encounter: Payer: Self-pay | Admitting: Internal Medicine

## 2014-12-31 DIAGNOSIS — M79671 Pain in right foot: Secondary | ICD-10-CM | POA: Diagnosis not present

## 2014-12-31 DIAGNOSIS — M25579 Pain in unspecified ankle and joints of unspecified foot: Secondary | ICD-10-CM | POA: Diagnosis not present

## 2014-12-31 DIAGNOSIS — N10 Acute tubulo-interstitial nephritis: Secondary | ICD-10-CM | POA: Diagnosis not present

## 2014-12-31 DIAGNOSIS — N400 Enlarged prostate without lower urinary tract symptoms: Secondary | ICD-10-CM | POA: Diagnosis not present

## 2014-12-31 DIAGNOSIS — M79672 Pain in left foot: Secondary | ICD-10-CM | POA: Insufficient documentation

## 2014-12-31 DIAGNOSIS — I5032 Chronic diastolic (congestive) heart failure: Secondary | ICD-10-CM | POA: Diagnosis not present

## 2014-12-31 NOTE — Progress Notes (Signed)
Patient ID: Patrick [name], male   DOB: 20-Jan-1914, 81 y.o.   MRN: [id number]     This is a routine visit.  Level care skilled.  Facility [facility].  Chief complaint --medical management of chronic medical conditions including-CHF-BPH-depression-restless legs.--- acute visit secondary to right foot pain--  UTI   History of present illness.  Patient is a very pleasant 81 year old male who has been quite stable-he did have a recent hospitalization for significantly elevated white count of over 50,000-this was thought to be possibly secondary to pneumonia this was treated and white count actually normalized fairly quickly.   in the past has been treated for osteomyelitis of the left foot as well.   he will see recently seen by psychiatric services for concerns C may have increased depression however this appeared been a short-term change in aby the time he psychiatric nurse practitioner saw him he was back to his baseline he does continue on Zoloft.  His complaint today is he has right foot discomfort apparently they are getting him some new shoes which mostly will help but I am following up on this   I did note he did have an arterial Doppler study last year which showed no evidence arterial occlusive disease of the right lower extremity   he a apparently had a uine culture done  recentlyt for complaints of dysuria A has a chronic indwelling Foley catheter with a history of BPH-it has grown out Proteus he has been afebrile and does not complain of fever or chills   Previous medical history.  History of left femur fracture status post repair.  BPH.  Restless legs.  Depression.  History of cervical DJD.  Osteoarthritis.  Osteomyelitis lower extremity.  History of basal cell carcinoma.  Diverticulosis.  Hyperlipidemia.  Right inguinal hernia.  Ischemic heart disease.  Hiatal hernia.  History of kidney stones.  History is esophageal stricture and dilation in  the past.  Recurrent TIAs.  Recurrent diverticulitis and perforation and exploratory laparotomy and sigmoid resection in June 2011.  GERD.  Hypertension.  Social history-patient previously lived in an assisted living facility today he has a very supportive son who lives in [place] distant history of tobacco use almost 50 years ago no history of alcohol abuse.  Family history is not pertinent.  Medications  Aspirin enteric-coated 81 mg daily.  Ducolax rectally and milligrams daily when necessary.  Vitamin D 2000 units daily.  Colace 100 mg daily.  Ensure daily.  Lasix 20 mg every other day.  Vicodin 5-3 25 mg-4 times a day when necessary pain.  DuoNeb nebulizers every 6 hours when necessary.  Milk of magnesia when necessary.  Melatonin 3 mg daily at bedtime when necessary.  Prilosec 20 mg daily.  Oxygen as needed via nasal cannula.  Potassium chloride 10 mEq every other day.  Requip 1.5 mg daily at bedtime.  Zoloft 50 mg daily.  Flomax 0.4 mg daily   Review of systems.  In general does not complain of any fever or chills does say he is somewhat weak.  Skin does not complain of any rashes or itching.  Eyes ears nose mouth and throat does not complain of visual changes or sore throat.  Respiratory does not complain of shortness of breath nursing staff has not really noted a significantly increased cough--recently treated for suspected pneumonia.  Cardiac does not complain of any chest pain palpitations--   GI is not complaining of any abdominal pain nausea vomiting diarrhea or constipation appears to  have a fairly decent appetite.  GU is not complaining of  overt dysuria does have a chronic indwelling Foley catheter--has at times apparently complained of dysuria.  Muscle skeletal--is complaining of some right foot pain at times this appears to be intermittent no history of trauma  A Neurologic is not complaining of dizziness headache  syncopal-type feelings.--Says his feet feel numb at times distal aspects bilaterally  Psych-has a history depression on Zoloft--this actually appears stable appears when I saw him last time he made some comments indicating worsening depression but this has not  reoccurred in fact couple days later he came by and told me that he was feeling  much   better--he has seen by the psychiatric nurse practitioner as well      Physical exam.   Temperature 97.4 pulse 74 respirations 18 blood pressure 103/50-125/50 most recently   In general this is a pleasant elderly male in no distress sitting comfortably in his chair  His skin is warm and dry he does have known numerous solar induced changes and skin cancer removal sites these appear to be stable  Oropharynx is clear mucous membranes appear fairly moist.  Eyes pupils appear reactive to light sclera and conjunctiva are clear visual acuity appears grossly intact.  Chest shallow air entry but I could not really appreciate overt congestion there is no labored breathing.  Heart is regular rate and rhythm without murmur gallop or rub-has slight edema of his left leg.  This is baseline.  Abdomen is protuberant nontender he has a well-healed surgical scar and a right sided hernia which appears to be unchanged-she does have positive bowel sounds.  GU he has a chronic indwelling catheter draining amber colored urin with some sediment.  Musculoskeletal is able to move all extremities 4 has some lower extremity weakness which is not new upper extremity strength appears preserved he ambulates in a wheelchair without difficulty. There is slight tenderness to palpation of the second toe on his right foot his foot is somewhat cool to touch nonerythematous there is a positive pedal pulse although somewhat reduced.  I distal aspect of right second toe there is a small ulcer  there istenderness there is no drainage bleeding or erythema.  I do not note any  deformity of the foot  Neurologic is grossly intact to speech is clear no lateralizing findings--touch sensation appears to be intact feet bilaterally.  Psych he is largely alert and oriented-again did express to [place] what appears to be some depression symptoms although he still pleasant conversant and engaged  Labs.  12/15/2014.  WBC 6.9 hemoglobin 10.2 platelets 221.  Sodium 134 potassium 3.9 BUN 11 creatinine 0.86.  Albumin 3.0 ALT 12 otherwise liver function tests within normal limits  12/08/2014.  Sodium 134 potassium 4.5 BUN 12 creatinine 1.21.  12/06/2014.  AST 23-ALT 11-S1 phosphatase 79-bilirubin 0.7-albumin 2.7.  12/08/2014.  WBC 10.5-hemoglobin 10.7-platelets 229.   Assessment and plan.  #1 suspected pneumonia-at this time no evidence of reoccurrence there is no cough or fever we will recheck a CBC for white count but this did gradually come down when he was in the hospital -.  #2 history of Proteus UTI --final culture and sensitivity reports are pending --we'll start Keflex empirically-I suspect this will help as well with any possible infection of his right second toe .  #3 acute kidney injury this resolved with IV hydration airing this hospitalization most recent creatinine 0.86 BUN of 11 on October 31 will recheck this  #4  normocytic anemia apparently this improved in the hospital with no evidence of bleeding this is thought secondary to IV hydration--appears to be stable with HBG 10.2 on lab done on 12/15/2014 will recheck this  #5-CHF this appears to be baseline on low-dose Lasix every other day with potassium supplementation Will update metabolic panel  [id number] of restless leg since appears to be stable on Requip.  #7 history BPHshe continues on Flomax this is stable he does have an indwelling Foley catheter.  #8-history constipation this appears to be stable on current medications.  #9 history depression- As noted above continues on Zoloft this  appears to be stable.  #10-history of right foot discomfort-new shoes have been ordered which hopefully will help will order an x-ray of the foot as well. Again he does have a somewhat tender right second toe we will see what the x-ray tells us-will start Keflex empirically for any possible small infection  He does complain of some bilateral foot numbness at times this appears to be somewhat neuropathic pain   theway he describes it-will do a trial course of Neurontin 100 mg daily I suspect we will titrate this up fairly aggressively  [id number]-of note greater than 35 minutes spent assessing patient-discussing his status with nursing staff-reviewing his chart-and coordinating and formulating a plan of care for numerous diagnoses-of note greater than 50% of time spent coordinating plan of care

## 2015-01-01 ENCOUNTER — Encounter (HOSPITAL_COMMUNITY)
Admission: AD | Admit: 2015-01-01 | Discharge: 2015-01-01 | Disposition: A | Discharge status: Home / Self Care | Payer: Medicare Other | Source: Skilled Nursing Facility | Attending: Internal Medicine | Admitting: Internal Medicine

## 2015-01-01 LAB — URINE CULTURE

## 2015-01-01 LAB — CBC WITH DIFFERENTIAL/PLATELET
BASOS PCT: 0 %
Basophils Absolute: 0 10*3/uL (ref 0.0–0.1)
Eosinophils Absolute: 0.3 10*3/uL (ref 0.0–0.7)
Eosinophils Relative: 6 %
HEMATOCRIT: 34.2 % — AB (ref 39.0–52.0)
HEMOGLOBIN: 11 g/dL — AB (ref 13.0–17.0)
LYMPHS ABS: 1.5 10*3/uL (ref 0.7–4.0)
LYMPHS PCT: 28 %
MCH: 29.1 pg (ref 26.0–34.0)
MCHC: 32.2 g/dL (ref 30.0–36.0)
MCV: 90.5 fL (ref 78.0–100.0)
MONO ABS: 0.9 10*3/uL (ref 0.1–1.0)
MONOS PCT: 16 %
NEUTROS ABS: 2.8 10*3/uL (ref 1.7–7.7)
NEUTROS PCT: 50 %
Platelets: 177 10*3/uL (ref 150–400)
RBC: 3.78 MIL/uL — ABNORMAL LOW (ref 4.22–5.81)
RDW: 13.5 % (ref 11.5–15.5)
WBC: 5.5 10*3/uL (ref 4.0–10.5)

## 2015-01-01 LAB — COMPREHENSIVE METABOLIC PANEL
ALT: 11 U/L — AB (ref 17–63)
ANION GAP: 4 — AB (ref 5–15)
AST: 19 U/L (ref 15–41)
Albumin: 3.1 g/dL — ABNORMAL LOW (ref 3.5–5.0)
Alkaline Phosphatase: 89 U/L (ref 38–126)
BUN: 12 mg/dL (ref 6–20)
CHLORIDE: 103 mmol/L (ref 101–111)
CO2: 29 mmol/L (ref 22–32)
CREATININE: 1.08 mg/dL (ref 0.61–1.24)
Calcium: 8.5 mg/dL — ABNORMAL LOW (ref 8.9–10.3)
GFR, EST NON AFRICAN AMERICAN: 55 mL/min — AB (ref >60)
Glucose, Bld: 93 mg/dL (ref 65–99)
POTASSIUM: 4.6 mmol/L (ref 3.5–5.1)
SODIUM: 136 mmol/L (ref 135–145)
Total Bilirubin: 0.6 mg/dL (ref 0.3–1.2)
Total Protein: 6.6 g/dL (ref 6.5–8.1)

## 2015-01-05 ENCOUNTER — Encounter (HOSPITAL_COMMUNITY)
Admission: RE | Admit: 2015-01-05 | Discharge: 2015-01-05 | Disposition: A | Discharge status: Home / Self Care | Payer: Medicare Other | Source: Skilled Nursing Facility | Attending: Internal Medicine | Admitting: Internal Medicine

## 2015-01-05 DIAGNOSIS — N400 Enlarged prostate without lower urinary tract symptoms: Secondary | ICD-10-CM | POA: Diagnosis not present

## 2015-01-06 LAB — VITAMIN D 25 HYDROXY (VIT D DEFICIENCY, FRACTURES): Vit D, 25-Hydroxy: 40.8 ng/mL (ref 30.0–100.0)

## 2015-02-17 ENCOUNTER — Other Ambulatory Visit: Payer: Self-pay | Admitting: *Deleted

## 2015-02-17 MED ORDER — HYDROCODONE-ACETAMINOPHEN 5-325 MG PO TABS: ORAL_TABLET | ORAL | Status: DC

## 2015-02-17 NOTE — Telephone Encounter (Signed)
[facility] 

## 2015-03-04 ENCOUNTER — Non-Acute Institutional Stay (SKILLED_NURSING_FACILITY): Payer: Medicare Other | Admitting: Internal Medicine

## 2015-03-04 DIAGNOSIS — L03115 Cellulitis of right lower limb: Secondary | ICD-10-CM | POA: Diagnosis not present

## 2015-03-09 DIAGNOSIS — L84 Corns and callosities: Secondary | ICD-10-CM | POA: Diagnosis not present

## 2015-03-09 DIAGNOSIS — B351 Tinea unguium: Secondary | ICD-10-CM | POA: Diagnosis not present

## 2015-03-09 DIAGNOSIS — I739 Peripheral vascular disease, unspecified: Secondary | ICD-10-CM | POA: Diagnosis not present

## 2015-03-09 DIAGNOSIS — M79671 Pain in right foot: Secondary | ICD-10-CM | POA: Diagnosis not present

## 2015-03-10 NOTE — Progress Notes (Signed)
Patient ID: Patrick [name], male   DOB: 10/14/1913, 82 y.o.   MRN: [id number]                PROGRESS NOTE  DATE:  03/04/2015        FACILITY: [facility]                   LEVEL OF CARE:   SNF   Acute Visit            CHIEF COMPLAINT:  Right foot pain.      HISTORY OF PRESENT ILLNESS:  Patrick [name] is a gentleman with whom we had a lot of problems with wounds and infection in his left foot, predominantly his great toe, through the summer of last year.  I believe we had to give him IV antibiotics at one point.    He has most recently developed pain and swelling in the right second toe.  I have been asked to look at this.  It appears that this came about over the weekend.  This is intensely painful and associated with some erythema and skin destruction dorsally over the second metatarsal head.  There is also some swelling in the dorsum of his foot.    PHYSICAL EXAMINATION:   SKIN:   INSPECTION:  Right foot:  His peripheral pulses are intact.  There is intense erythema and pain in the second toe, tenderness in the second metatarsophalangeal joint.  As mentioned, some skin destruction proximal to the second toe and some light erythema and edema in the proximal right foot.    ASSESSMENT/PLAN:                   Cellulitis of the right foot, predominantly involving the second toe.  Some of this looked almost like a crystal arthropathy, although I think the erythema on the dorsal aspect of the foot is beyond what I might expect for gout.  He is going to need oral antibiotics, doxycycline and Cipro.  There is nothing here to culture.  We will have to follow this clinically.     CPT CODE: 29562

## 2015-03-25 ENCOUNTER — Inpatient Hospital Stay (HOSPITAL_COMMUNITY)
Admit: 2015-03-25 | Discharge: 2015-03-25 | Disposition: A | Discharge status: Home / Self Care | Payer: Medicare Other | Attending: Internal Medicine | Admitting: Internal Medicine

## 2015-03-25 DIAGNOSIS — M25551 Pain in right hip: Secondary | ICD-10-CM | POA: Diagnosis not present

## 2015-03-25 DIAGNOSIS — M1711 Unilateral primary osteoarthritis, right knee: Secondary | ICD-10-CM | POA: Diagnosis not present

## 2015-03-26 DIAGNOSIS — G4700 Insomnia, unspecified: Secondary | ICD-10-CM | POA: Diagnosis not present

## 2015-03-26 DIAGNOSIS — F329 Major depressive disorder, single episode, unspecified: Secondary | ICD-10-CM | POA: Diagnosis not present

## 2015-04-08 ENCOUNTER — Non-Acute Institutional Stay (SKILLED_NURSING_FACILITY): Payer: Medicare Other | Admitting: Internal Medicine

## 2015-04-08 ENCOUNTER — Encounter: Payer: Self-pay | Admitting: Internal Medicine

## 2015-04-08 DIAGNOSIS — N179 Acute kidney failure, unspecified: Secondary | ICD-10-CM

## 2015-04-08 DIAGNOSIS — Z8739 Personal history of other diseases of the musculoskeletal system and connective tissue: Secondary | ICD-10-CM | POA: Insufficient documentation

## 2015-04-08 DIAGNOSIS — F329 Major depressive disorder, single episode, unspecified: Secondary | ICD-10-CM | POA: Diagnosis not present

## 2015-04-08 DIAGNOSIS — I950 Idiopathic hypotension: Secondary | ICD-10-CM

## 2015-04-08 DIAGNOSIS — L03031 Cellulitis of right toe: Secondary | ICD-10-CM | POA: Diagnosis not present

## 2015-04-08 DIAGNOSIS — I509 Heart failure, unspecified: Secondary | ICD-10-CM

## 2015-04-08 DIAGNOSIS — F32A Depression, unspecified: Secondary | ICD-10-CM

## 2015-04-08 NOTE — Progress Notes (Signed)
Patient ID: MEHDI MEYERHOFER, male   DOB: 16-Feb-1913, 82 y.o.   MRN: [id number]      This is a routine visit.  Level care skilled.  Facility [facility].  Chief complaint --medical management of chronic medical conditions including-CHF-BPH-depression-restless legs.--- acute visit secondary right foot erythema   History of present illness.  Patient is a very pleasant 82 year old male who has been quite stable-he did have a  Hospitalization  Last year for significantly elevated white count of over 50,000-this was thought to be possibly secondary to pneumonia this was treated and white count actually normalized fairly quickly.   in the past has been treated for osteomyelitis of the left foot as well. He was seen last month by Dr. Dellia Nims for some erythema of his right foot this responded to a course of Cipro and doxycycline-nursing staff he has some recurrent erythema here again-and some tenderness of his right second toe- He is afebrile otherwise does not really have any complaints         I did note he did have an arterial Doppler study last year which showed no evidence arterial occlusive disease of the right lower extremity  He was also seen late last year for possibly increased depressive symptoms-he is well by nurse practitioner thought to be stable in this regard he continues on Zoloft does not really present as depressed today continues to be pleasant conversant   Previous medical history.  History of left femur fracture status post repair.  BPH.  Restless legs.  Depression.  History of cervical DJD.  Osteoarthritis.  Osteomyelitis lower extremity.  History of basal cell carcinoma.  Diverticulosis.  Hyperlipidemia.  Right inguinal hernia.  Ischemic heart disease.  Hiatal hernia.  History of kidney stones.  History is esophageal stricture and dilation in the past.  Recurrent TIAs.  Recurrent diverticulitis and perforation and exploratory laparotomy  and sigmoid resection in June 2011.  GERD.  Hypertension.  Social history-patient previously lived in an assisted living facility today he has a very supportive son who lives in [place] distant history of tobacco use almost 50 years ago no history of alcohol abuse.  Family history is not pertinent.  Medications  Aspirin enteric-coated 81 mg daily.  Ducolax rectally and milligrams daily when necessary.  Vitamin D 2000 units daily.  Colace 100 mg daily.  Ensure daily.  Lasix 20 mg every other day.  Vicodin 5-3 25 mg-4 times a day when necessary pain.  DuoNeb nebulizers every 6 hours when necessary.  Milk of magnesia when necessary.  Melatonin 3 mg daily at bedtime when necessary.  Prilosec 20 mg daily.  Oxygen as needed via nasal cannula.  Potassium chloride 10 mEq every other day.  Requip 1.5 mg daily at bedtime.  Zoloft 50 mg daily.  Flomax 0.4 mg daily   Review of systems.  In general does not complain of any fever or chills   Skin does not complain of any rashes or itching does have increased erythema distal portion of his right foot.  Eyes ears nose mouth and throat does not complain of visual changes or sore throat.  Respiratory does not complain of shortness of breath nursing staff has not really noted a significantly increased cough--  Cardiac does not complain of any chest pain palpitations--   GI is not complaining of any abdominal pain nausea vomiting diarrhea or constipation appears to have a fairly decent appetite.  GU is not complaining of  overt dysuria does have a chronic indwelling Foley  catheter   Muscle skeletal--is complaining of some right foot pain at times this appears to be intermittent no history of trauma  A Neurologic is not complaining of dizziness headache syncopal-type feelings.--   Psych-has a history depression on Zoloft--this actually appears stable       Physical exam.  Temp    98.0 pulse 76  respirations 20 blood pressure 114/47   In general this is a pleasant elderly male in no distress sitting comfortably in his chair  His skin is warm and dry he does have known numerous solar induced changes and skin cancer removal sites these appear to be stable  Oropharynx is clear mucous membranes appear fairly moist.  Eyes pupils appear reactive to light sclera and conjunctiva are clear visual acuity appears grossly intact.  Chest shallow air entry but I could not really appreciate overt congestion there is no labored breathing . Heart  regular rate and rhythm he does have mild edema of his lower extremities bilaterally    This is baseline.  Abdomen is protuberant nontender he has a well-healed surgical scar and a right sided hernia which appears to be unchanged-does have positive bowel sounds.  GU he has a chronic indwelling catheter draining amber colored urine  Musculoskeletal is able to move all extremities 4 has some lower extremity weakness which is not new strength appears preserved he ambulates in a wheelchair without difficulty. He does have erythema the distal aspect on the top of his right foot extending to the second third and fourth toes there is some tenderness to palpation of the toes as well slight warmth   I do not note any deformity of the foot  Neurologic is grossly intact to speech is clear no lateralizing findings--touch sensation appears to be intact feet bilaterally.  Psych he is largely alert and oriented-pleasant and conversant   Labs.  01/05/2015.  Vitamin D level XL.8.  01/01/2059.  Sodium 136 potassium 4.6 BUN 12 creatinine 1.08.  Albumin 3.3 ALT 11 otherwise liver function tests within normal limits.  WBC 5.5 hemoglobin 11.0 platelets 177  12/15/2014.  WBC 6.9 hemoglobin 10.2 platelets 221.  Sodium 134 potassium 3.9 BUN 11 creatinine 0.86.  Albumin 3.0 ALT 12 otherwise liver function tests within normal  limits  12/08/2014.  Sodium 134 potassium 4.5 BUN 12 creatinine 1.21.  12/06/2014.  AST 23-ALT 11-S1 phosphatase 79-bilirubin 0.7-albumin 2.7.  12/08/2014.  WBC 10.5-hemoglobin 10.7-platelets 229.   Assessment and plan  ght foot cellulitis-we'll treat as before with Cipro and doxycycline for a seven-day course this apparently worked pretty effectively previously-there also is concerned somewhat for gout etiology we will do a short course of colchicine 0.6 mg twice a day for 3 days and update a uric acid    #2 history of acute kidney injury this appears resolved with creatinine of 1.08 BUN of 12 on 01/01/2015 will update this  #3--history of depression again this appears to be quite stable on current dose of Zoloft he is followed by psychiatric nurse practitioner  #4 normocytic anemia apparently this improved in the hospital with no evidence of bleeding this is thought secondary to IV hydration--appears to be stable with HBG 11.0 on lab done 01/01/2015   #5-CHF this appears to be baseline on low-dose Lasix every other day with potassium supplementation Will update metabolic panel--edema appears to be stable to somewhat improved  #6-history of restless leg since appears to be stable on Requip.  #7 history BPHe continues on Flomax this is stable he does  have an indwelling Foley catheter.  #8-history constipation this appears to be stable on current medications.    Number9-- hypotension at one point does appear to be an issue but this has been stable now for an extended period-recent blood pressures 114/47-108/50-154/64   He has complained of some bilateral foot numbness at times this appears to be somewhat neuropathic pain  He appears her responded well to Neurontin 100 mg daily this was initially started as a trial dose   [id number]-of note greater than 35 minutes spent assessing patient-discussing his status with nursing staff-reviewing his chart-and coordinating and  formulating a plan of care for numerous diagnoses-of note greater than 50% of time spent coordinating plan of care

## 2015-04-09 ENCOUNTER — Encounter (HOSPITAL_COMMUNITY)
Admission: AD | Admit: 2015-04-09 | Discharge: 2015-04-09 | Disposition: A | Discharge status: Home / Self Care | Payer: Medicare Other | Source: Skilled Nursing Facility | Attending: Internal Medicine | Admitting: Internal Medicine

## 2015-04-09 DIAGNOSIS — I10 Essential (primary) hypertension: Secondary | ICD-10-CM | POA: Diagnosis not present

## 2015-04-09 LAB — URIC ACID: Uric Acid, Serum: 6.6 mg/dL (ref 4.4–7.6)

## 2015-04-09 LAB — COMPREHENSIVE METABOLIC PANEL
ALT: 12 U/L — ABNORMAL LOW (ref 17–63)
AST: 17 U/L (ref 15–41)
Albumin: 3.4 g/dL — ABNORMAL LOW (ref 3.5–5.0)
Alkaline Phosphatase: 102 U/L (ref 38–126)
Anion gap: 7 (ref 5–15)
BILIRUBIN TOTAL: 0.5 mg/dL (ref 0.3–1.2)
BUN: 13 mg/dL (ref 6–20)
CALCIUM: 8.6 mg/dL — AB (ref 8.9–10.3)
CO2: 30 mmol/L (ref 22–32)
Chloride: 101 mmol/L (ref 101–111)
Creatinine, Ser: 1.04 mg/dL (ref 0.61–1.24)
GFR calc Af Amer: >60 mL/min (ref >60)
GFR calc non Af Amer: 57 mL/min — ABNORMAL LOW (ref >60)
GLUCOSE: 94 mg/dL (ref 65–99)
POTASSIUM: 4.5 mmol/L (ref 3.5–5.1)
SODIUM: 138 mmol/L (ref 135–145)
TOTAL PROTEIN: 7.5 g/dL (ref 6.5–8.1)

## 2015-04-09 LAB — CBC WITH DIFFERENTIAL/PLATELET
BASOS ABS: 0 10*3/uL (ref 0.0–0.1)
Basophils Relative: 0 %
EOS ABS: 0.3 10*3/uL (ref 0.0–0.7)
EOS PCT: 4 %
HCT: 35.9 % — ABNORMAL LOW (ref 39.0–52.0)
Hemoglobin: 11.8 g/dL — ABNORMAL LOW (ref 13.0–17.0)
LYMPHS PCT: 21 %
Lymphs Abs: 1.7 10*3/uL (ref 0.7–4.0)
MCH: 28.9 pg (ref 26.0–34.0)
MCHC: 32.9 g/dL (ref 30.0–36.0)
MCV: 88 fL (ref 78.0–100.0)
MONO ABS: 1.2 10*3/uL — AB (ref 0.1–1.0)
Monocytes Relative: 14 %
Neutro Abs: 5 10*3/uL (ref 1.7–7.7)
Neutrophils Relative %: 61 %
PLATELETS: 238 10*3/uL (ref 150–400)
RBC: 4.08 MIL/uL — ABNORMAL LOW (ref 4.22–5.81)
RDW: 14.2 % (ref 11.5–15.5)
WBC: 8.2 10*3/uL (ref 4.0–10.5)

## 2015-04-10 LAB — VITAMIN D 25 HYDROXY (VIT D DEFICIENCY, FRACTURES): VIT D 25 HYDROXY: 44.3 ng/mL (ref 30.0–100.0)

## 2015-04-12 ENCOUNTER — Emergency Department (HOSPITAL_COMMUNITY)
Admission: EM | Admit: 2015-04-12 | Discharge: 2015-04-12 | Disposition: A | Discharge status: Home / Self Care | Payer: Medicare Other | Attending: Emergency Medicine | Admitting: Emergency Medicine

## 2015-04-12 ENCOUNTER — Emergency Department (HOSPITAL_COMMUNITY): Payer: Medicare Other

## 2015-04-12 ENCOUNTER — Encounter (HOSPITAL_COMMUNITY): Payer: Self-pay

## 2015-04-12 DIAGNOSIS — R05 Cough: Secondary | ICD-10-CM | POA: Diagnosis not present

## 2015-04-12 DIAGNOSIS — J029 Acute pharyngitis, unspecified: Secondary | ICD-10-CM | POA: Diagnosis not present

## 2015-04-12 DIAGNOSIS — I10 Essential (primary) hypertension: Secondary | ICD-10-CM | POA: Diagnosis not present

## 2015-04-12 DIAGNOSIS — R112 Nausea with vomiting, unspecified: Secondary | ICD-10-CM | POA: Insufficient documentation

## 2015-04-12 DIAGNOSIS — K219 Gastro-esophageal reflux disease without esophagitis: Secondary | ICD-10-CM | POA: Insufficient documentation

## 2015-04-12 DIAGNOSIS — Z85828 Personal history of other malignant neoplasm of skin: Secondary | ICD-10-CM | POA: Diagnosis not present

## 2015-04-12 DIAGNOSIS — R059 Cough, unspecified: Secondary | ICD-10-CM

## 2015-04-12 DIAGNOSIS — M1990 Unspecified osteoarthritis, unspecified site: Secondary | ICD-10-CM | POA: Diagnosis not present

## 2015-04-12 DIAGNOSIS — R197 Diarrhea, unspecified: Secondary | ICD-10-CM | POA: Insufficient documentation

## 2015-04-12 DIAGNOSIS — R109 Unspecified abdominal pain: Secondary | ICD-10-CM

## 2015-04-12 DIAGNOSIS — R079 Chest pain, unspecified: Secondary | ICD-10-CM | POA: Diagnosis not present

## 2015-04-12 DIAGNOSIS — Z8673 Personal history of transient ischemic attack (TIA), and cerebral infarction without residual deficits: Secondary | ICD-10-CM | POA: Diagnosis not present

## 2015-04-12 DIAGNOSIS — Z792 Long term (current) use of antibiotics: Secondary | ICD-10-CM | POA: Insufficient documentation

## 2015-04-12 DIAGNOSIS — Z7982 Long term (current) use of aspirin: Secondary | ICD-10-CM | POA: Diagnosis not present

## 2015-04-12 DIAGNOSIS — I509 Heart failure, unspecified: Secondary | ICD-10-CM | POA: Insufficient documentation

## 2015-04-12 DIAGNOSIS — Z79899 Other long term (current) drug therapy: Secondary | ICD-10-CM | POA: Insufficient documentation

## 2015-04-12 DIAGNOSIS — R1111 Vomiting without nausea: Secondary | ICD-10-CM | POA: Diagnosis not present

## 2015-04-12 DIAGNOSIS — Z87442 Personal history of urinary calculi: Secondary | ICD-10-CM | POA: Insufficient documentation

## 2015-04-12 HISTORY — DX: Gout, unspecified: M10.9

## 2015-04-12 HISTORY — DX: Pain in right foot: M79.671

## 2015-04-12 HISTORY — DX: Pain in right hip: M25.551

## 2015-04-12 HISTORY — DX: Pain in left foot: M79.672

## 2015-04-12 LAB — URINALYSIS, ROUTINE W REFLEX MICROSCOPIC
Bilirubin Urine: NEGATIVE
GLUCOSE, UA: NEGATIVE mg/dL
Ketones, ur: NEGATIVE mg/dL
Nitrite: POSITIVE — AB
PROTEIN: 100 mg/dL — AB
Specific Gravity, Urine: 1.01 (ref 1.005–1.030)

## 2015-04-12 LAB — COMPREHENSIVE METABOLIC PANEL
ALT: 13 U/L — AB (ref 17–63)
AST: 20 U/L (ref 15–41)
Albumin: 3.8 g/dL (ref 3.5–5.0)
Alkaline Phosphatase: 105 U/L (ref 38–126)
Anion gap: 10 (ref 5–15)
BUN: 20 mg/dL (ref 6–20)
CO2: 27 mmol/L (ref 22–32)
CREATININE: 1.18 mg/dL (ref 0.61–1.24)
Calcium: 9.2 mg/dL (ref 8.9–10.3)
Chloride: 104 mmol/L (ref 101–111)
GFR calc Af Amer: 57 mL/min — ABNORMAL LOW (ref >60)
GFR, EST NON AFRICAN AMERICAN: 49 mL/min — AB (ref >60)
Glucose, Bld: 130 mg/dL — ABNORMAL HIGH (ref 65–99)
Potassium: 4.2 mmol/L (ref 3.5–5.1)
Sodium: 141 mmol/L (ref 135–145)
TOTAL PROTEIN: 8 g/dL (ref 6.5–8.1)
Total Bilirubin: 0.7 mg/dL (ref 0.3–1.2)

## 2015-04-12 LAB — RAPID STREP SCREEN (MED CTR MEBANE ONLY): Streptococcus, Group A Screen (Direct): NEGATIVE

## 2015-04-12 LAB — CBC WITH DIFFERENTIAL/PLATELET
Basophils Absolute: 0 10*3/uL (ref 0.0–0.1)
Basophils Relative: 0 %
EOS PCT: 0 %
Eosinophils Absolute: 0 10*3/uL (ref 0.0–0.7)
HCT: 36.3 % — ABNORMAL LOW (ref 39.0–52.0)
Hemoglobin: 11.9 g/dL — ABNORMAL LOW (ref 13.0–17.0)
LYMPHS ABS: 1.2 10*3/uL (ref 0.7–4.0)
LYMPHS PCT: 8 %
MCH: 28.5 pg (ref 26.0–34.0)
MCHC: 32.8 g/dL (ref 30.0–36.0)
MCV: 86.8 fL (ref 78.0–100.0)
MONO ABS: 1.3 10*3/uL — AB (ref 0.1–1.0)
Monocytes Relative: 9 %
Neutro Abs: 11.6 10*3/uL — ABNORMAL HIGH (ref 1.7–7.7)
Neutrophils Relative %: 83 %
PLATELETS: 310 10*3/uL (ref 150–400)
RBC: 4.18 MIL/uL — AB (ref 4.22–5.81)
RDW: 14.1 % (ref 11.5–15.5)
WBC: 14.1 10*3/uL — ABNORMAL HIGH (ref 4.0–10.5)

## 2015-04-12 LAB — LACTIC ACID, PLASMA
Lactic Acid, Venous: 1.6 mmol/L (ref 0.5–2.0)
Lactic Acid, Venous: 1 mmol/L (ref 0.5–2.0)

## 2015-04-12 LAB — URINE MICROSCOPIC-ADD ON

## 2015-04-12 LAB — BRAIN NATRIURETIC PEPTIDE: B Natriuretic Peptide: 245 pg/mL — ABNORMAL HIGH (ref 0.0–100.0)

## 2015-04-12 LAB — TROPONIN I: TROPONIN I: 0.03 ng/mL (ref <0.031)

## 2015-04-12 LAB — LIPASE, BLOOD: Lipase: 17 U/L (ref 11–51)

## 2015-04-12 MED ORDER — IOHEXOL 300 MG/ML  SOLN
100.0000 mL | Freq: Once | INTRAMUSCULAR | Status: AC | PRN
  Administered 2015-04-12: 100 mL via INTRAVENOUS

## 2015-04-12 MED ORDER — DIATRIZOATE MEGLUMINE & SODIUM 66-10 % PO SOLN
ORAL | Status: AC
  Administered 2015-04-12: 30 mL
  Filled 2015-04-12: qty 30

## 2015-04-12 MED ORDER — ONDANSETRON HCL 4 MG/2ML IJ SOLN
4.0000 mg | INTRAMUSCULAR | Status: DC | PRN
  Administered 2015-04-12: 4 mg via INTRAVENOUS
  Filled 2015-04-12: qty 2

## 2015-04-12 MED ORDER — SODIUM CHLORIDE 0.9 % IV SOLN
INTRAVENOUS | Status: DC
  Administered 2015-04-12: 75 mL/h via INTRAVENOUS

## 2015-04-12 NOTE — ED Notes (Signed)
Pt coughing up thick clear sputum.  States he is "vomiting" and his stomach hurts, but no bile or stomach contents noted.

## 2015-04-12 NOTE — ED Provider Notes (Signed)
CSN: [id number]     Arrival date & time 04/12/15  1420 History   First MD Initiated Contact with Patient 04/12/15 1511     Chief Complaint  Patient presents with  . Abdominal Pain      HPI  Pt was seen at 1515. Per NH report and pt: c/o gradual onset and persistence of constant multiple symptoms for the past 2 days. Pt states his symptoms include: sore throat, moist cough, abd pain, N/V/D, and generalized body aches. NH states pt told them he had chest pain; pt denies he has had any CP to ED staff on arrival to ED. No reported fevers, no black or blood in stools or emesis, no back pain, no SOB.    Past Medical History  Diagnosis Date  . Anxiety   . HTN (hypertension)   . Reflux   . Skin cancer, basal cell   . Diverticulitis   . Hyperlipidemia   . Inguinal hernia   . Ischemic heart disease   . Hiatal hernia   . Kidney stone   . Esophageal stricture   . History of recurrent TIAs   . Restless leg syndrome   . GERD (gastroesophageal reflux disease)   . Pleural effusion   . CHF (congestive heart failure) ([place])   . Urinary retention   . DNR (do not resuscitate)   . DJD (degenerative joint disease), cervical   . Osteoarthritis     bilat knees  . Gout   . Right hip pain   . Bilateral foot pain    Past Surgical History  Procedure Laterality Date  . Intestines    . Exploratory laparotomy w/ bowel resection    . Esophageal dilation    . Fracture surgery    . Hemiarthroplasty hip     Family History  Problem Relation Age of Onset  . Arthritis     Social History  Substance Use Topics  . Smoking status: Never Smoker   . Smokeless tobacco: None  . Alcohol Use: No    Review of Systems ROS: Statement: All systems negative except as marked or noted in the HPI; Constitutional: Negative for fever and chills. +generalized body aches.; ; Eyes: Negative for eye pain, redness and discharge. ; ; ENMT: Negative for ear pain, hoarseness, nasal congestion, sinus pressure and +sore  throat. ; ; Cardiovascular: +CP. Negative for palpitations, diaphoresis, dyspnea and peripheral edema. ; ; Respiratory: +cough. Negative for wheezing and stridor. ; ; Gastrointestinal: +N/V/D, abd pain. Negative for blood in stool, hematemesis, jaundice and rectal bleeding. . ; ; Genitourinary: Negative for dysuria, flank pain and hematuria. ; ; Musculoskeletal: Negative for back pain and neck pain. Negative for swelling and trauma.; ; Skin: Negative for pruritus, rash, abrasions, blisters, bruising and skin lesion.; ; Neuro: Negative for headache, lightheadedness and neck stiffness. Negative for weakness, altered level of consciousness , altered mental status, extremity weakness, paresthesias, involuntary movement, seizure and syncope.      Allergies  Celebrex and Codeine  Home Medications   Prior to Admission medications   Medication Sig Start Date End Date Taking? Authorizing Provider  aspirin EC 81 MG tablet Take 81 mg by mouth daily.    Historical Provider, MD  benzonatate (TESSALON) 100 MG capsule Take 1 capsule (100 mg total) by mouth 3 (three) times daily. 12/08/14   Kathie Dike, MD  bisacodyl (DULCOLAX) 10 MG suppository Place 10 mg rectally as needed for mild constipation or moderate constipation.    Historical Provider, MD  Cholecalciferol (  VITAMIN D) 2000 UNITS CAPS Take 1 capsule by mouth daily.    Historical Provider, MD  docusate sodium (COLACE) 100 MG capsule Take 100 mg by mouth daily. For constipation.    Historical Provider, MD  ENSURE (ENSURE) Take 237 mLs by mouth daily.    Historical Provider, MD  furosemide (LASIX) 20 MG tablet Take 20 mg by mouth every other day. For edema.    Historical Provider, MD  HYDROcodone-acetaminophen (NORCO/VICODIN) 5-325 MG tablet Take one tablet by mouth four times a day as needed for pain DO NOT EXCEED 3 GM APAP IN 24 HOURS FROM ALL SOURCES 02/17/15   Estill Dooms, MD  ipratropium-albuterol (DUONEB) 0.5-2.5 (3) MG/3ML SOLN Take 3 mLs by  nebulization every 6 (six) hours as needed (shortness of breath).    Historical Provider, MD  levalbuterol Penne Lash) 0.63 MG/3ML nebulizer solution Take 0.63 mg by nebulization every 6 (six) hours.    Historical Provider, MD  levofloxacin (LEVAQUIN) 750 MG tablet Take 1 tablet (750 mg total) by mouth daily. For 3 more days 12/08/14   Kathie Dike, MD  magnesium hydroxide (MILK OF MAGNESIA) 400 MG/5ML suspension Take 30 mLs by mouth daily as needed for mild constipation. If no relief try a Fleets enema, after 2 days.    Historical Provider, MD  Melatonin 3 MG CAPS Take 3 mg by mouth at bedtime as needed (for sleep).    Historical Provider, MD  omeprazole (PRILOSEC) 20 MG capsule Take 20 mg by mouth daily.    Historical Provider, MD  OXYGEN Inhale 2 L into the lungs continuous.    Historical Provider, MD  potassium chloride (K-DUR,KLOR-CON) 10 MEQ tablet Take 10 mEq by mouth every other day.     Historical Provider, MD  rOPINIRole (REQUIP) 1 MG tablet Take 1.5 mg by mouth at bedtime.     Historical Provider, MD  sertraline (ZOLOFT) 50 MG tablet Take 50 mg by mouth daily.      Historical Provider, MD  tamsulosin (FLOMAX) 0.4 MG CAPS capsule Take 0.4 mg by mouth daily.    Historical Provider, MD   BP 158/98 mmHg  Pulse 71  Temp(Src) 97.6 F (36.4 C) (Oral)  Resp 18  Ht 5\' 10"  (1.778 m)  Wt 168 lb (76.204 kg)  BMI 24.11 kg/m2  SpO2 99% Physical Exam  1520: Physical examination:  Nursing notes reviewed; Vital signs and O2 SAT reviewed;  Constitutional: Well developed, Well nourished, In no acute distress; Head:  Normocephalic, atraumatic; Eyes: EOMI, PERRL, No scleral icterus; ENMT: Mouth and pharynx normal, Mucous membranes dry. Mouth and pharynx without lesions. No tonsillar exudates. No intra-oral edema. No submandibular or sublingual edema. No hoarse voice, no drooling, no stridor. No pain with manipulation of larynx. No trismus.; Neck: Supple, Full range of motion, No lymphadenopathy;  Cardiovascular: Regular rate and rhythm, No gallop; Respiratory: Breath sounds coarse & equal bilaterally, No wheezes.  Speaking full sentences with ease, Normal respiratory effort/excursion; Chest: Nontender, Movement normal; Abdomen: Soft, Nontender, Nondistended, Normal bowel sounds; Genitourinary: No CVA tenderness; Extremities: Pulses normal, No tenderness, No edema, No calf edema or asymmetry.; Neuro: AA&Ox3, vague historian. Major CN grossly intact.  Speech clear. Moves all extremities on stretcher..; Skin: Color normal, Warm, Dry.   ED Course  Procedures (including critical care time) Labs Review  Imaging Review  I have personally reviewed and evaluated these images and lab results as part of my medical decision-making.   EKG Interpretation   Date/Time:  Sunday April 12 2015 14:55:22  EST Ventricular Rate:  59 PR Interval:  51 QRS Duration: 113 QT Interval:  458 QTC Calculation: [id number] R Axis:   -73 Text Interpretation:  Sinus rhythm Short PR interval Borderline IVCD with  LAD Anteroseptal infarct, age indeterminate Artifact When compared with  ECG of 12/05/2014 No significant change was found Confirmed by [facility]   MD, Nunzio Cory [phone]) on 04/12/2015 3:30:44 PM      MDM  MDM Reviewed: previous chart, nursing note and vitals Reviewed previous: labs and ECG Interpretation: labs, ECG and x-ray     Results for orders placed or performed during the hospital encounter of 04/12/15  Rapid strep screen  Result Value Ref Range   Streptococcus, Group A Screen (Direct) NEGATIVE NEGATIVE  Comprehensive metabolic panel  Result Value Ref Range   Sodium 141 135 - 145 mmol/L   Potassium 4.2 3.5 - 5.1 mmol/L   Chloride 104 101 - 111 mmol/L   CO2 27 22 - 32 mmol/L   Glucose, Bld 130 (H) 65 - 99 mg/dL   BUN 20 6 - 20 mg/dL   Creatinine, Ser 1.18 0.61 - 1.24 mg/dL   Calcium 9.2 8.9 - 10.3 mg/dL   Total Protein 8.0 6.5 - 8.1 g/dL   Albumin 3.8 3.5 - 5.0 g/dL   AST 20 15 - 41 U/L    ALT 13 (L) 17 - 63 U/L   Alkaline Phosphatase 105 38 - 126 U/L   Total Bilirubin 0.7 0.3 - 1.2 mg/dL   GFR calc non Af Amer 49 (L) >60 mL/min   GFR calc Af Amer 57 (L) >60 mL/min   Anion gap 10 5 - 15  Troponin I  Result Value Ref Range   Troponin I <0.03 <0.031 ng/mL  Lactic acid, plasma  Result Value Ref Range   Lactic Acid, Venous 1.6 0.5 - 2.0 mmol/L  Lactic acid, plasma  Result Value Ref Range   Lactic Acid, Venous 1.0 0.5 - 2.0 mmol/L  Lipase, blood  Result Value Ref Range   Lipase 17 11 - 51 U/L  CBC with Differential  Result Value Ref Range   WBC 14.1 (H) 4.0 - 10.5 K/uL   RBC 4.18 (L) 4.22 - 5.81 MIL/uL   Hemoglobin 11.9 (L) 13.0 - 17.0 g/dL   HCT 36.3 (L) 39.0 - 52.0 %   MCV 86.8 78.0 - 100.0 fL   MCH 28.5 26.0 - 34.0 pg   MCHC 32.8 30.0 - 36.0 g/dL   RDW 14.1 11.5 - 15.5 %   Platelets 310 150 - 400 K/uL   Neutrophils Relative % 83 %   Neutro Abs 11.6 (H) 1.7 - 7.7 K/uL   Lymphocytes Relative 8 %   Lymphs Abs 1.2 0.7 - 4.0 K/uL   Monocytes Relative 9 %   Monocytes Absolute 1.3 (H) 0.1 - 1.0 K/uL   Eosinophils Relative 0 %   Eosinophils Absolute 0.0 0.0 - 0.7 K/uL   Basophils Relative 0 %   Basophils Absolute 0.0 0.0 - 0.1 K/uL  Urinalysis, Routine w reflex microscopic  Result Value Ref Range   Color, Urine YELLOW YELLOW   APPearance CLOUDY (A) CLEAR   Specific Gravity, Urine 1.010 1.005 - 1.030   pH >9.0 (H) 5.0 - 8.0   Glucose, UA NEGATIVE NEGATIVE mg/dL   Hgb urine dipstick LARGE (A) NEGATIVE   Bilirubin Urine NEGATIVE NEGATIVE   Ketones, ur NEGATIVE NEGATIVE mg/dL   Protein, ur 100 (A) NEGATIVE mg/dL   Nitrite POSITIVE (A) NEGATIVE   Leukocytes,  UA SMALL (A) NEGATIVE  Brain natriuretic peptide  Result Value Ref Range   B Natriuretic Peptide 245.0 (H) 0.0 - 100.0 pg/mL  Urine microscopic-add on  Result Value Ref Range   Squamous Epithelial / LPF 0-5 (A) NONE SEEN   WBC, UA 0-5 0 - 5 WBC/hpf   RBC / HPF TOO NUMEROUS TO COUNT 0 - 5 RBC/hpf    Bacteria, UA FEW (A) NONE SEEN   Crystals TRIPLE PHOSPHATE CRYSTALS (A) NEGATIVE  Troponin I  Result Value Ref Range   Troponin I 0.03 <0.031 ng/mL   Ct Abdomen Pelvis W Contrast 04/12/2015  CLINICAL DATA:  Abdominal pain and vomiting. EXAM: CT ABDOMEN AND PELVIS WITH CONTRAST TECHNIQUE: Multidetector CT imaging of the abdomen and pelvis was performed using the standard protocol following bolus administration of intravenous contrast. CONTRAST:  171mL OMNIPAQUE IOHEXOL 300 MG/ML  SOLN COMPARISON:  03/08/2013 FINDINGS: Lower chest: No acute findings. Bilateral pleural effusions and bibasilar atelectasis have resolved since previous study. Small hiatal hernia again noted. Hepatobiliary: Left hepatic lobe cyst remains stable however no liver masses are identified. Tiny calcified gallstones are seen however there is no evidence acute cholecystitis or biliary ductal dilatation. Pancreas: No mass, inflammatory changes, or other significant abnormality. Spleen: Within normal limits in size and appearance. Adrenals/Urinary Tract: No masses identified. No evidence of hydronephrosis. Foley catheter is seen within the bladder which is nearly completely empty. Stomach/Bowel: No evidence of obstruction, inflammatory process, or abnormal fluid collections. Diverticulosis is seen, however there is no evidence of diverticulitis moderate anterior abdominal wall hernia is seen containing small bowel loops, however there is no evidence of small bowel obstruction or ischemia. Vascular/Lymphatic: No pathologically enlarged lymph nodes. Ectasia of abdominal aorta stable but no evidence of abdominal aortic aneurysm. Reproductive: No mass or other significant abnormality identified. Other: Beam hardening artifact through the inferior pelvis from left hip prosthesis. Musculoskeletal:  No suspicious bone lesions identified. IMPRESSION: No acute findings identified within the abdomen or pelvis. Cholelithiasis.  No radiographic evidence  of cholecystitis. Stable small hiatal hernia. Colonic diverticulosis. No radiographic evidence of diverticulitis. Stable ventral abdominal wall hernia containing small bowel. No evidence of small bowel obstruction or ischemia. Electronically Signed   By: Earle Gell M.D.   On: 04/12/2015 19:41   Dg Abd Acute W/chest 04/12/2015  CLINICAL DATA:  Vomiting.  Prior esophageal strictures. EXAM: DG ABDOMEN ACUTE W/ 1V CHEST COMPARISON:  Multiple exams, including 12/05/2014 and 03/08/2013 FINDINGS: The patient is rotated to the right on today's radiograph, reducing diagnostic sensitivity and specificity. Atherosclerotic aortic arch. Heart size within normal limits. Linear subsegmental atelectasis or scarring along the left hemidiaphragm. Bony demineralization. No free intraperitoneal gas is observed beneath the hemidiaphragms. Paucity of bowel gas with some gas and formed stool in the right colon and rectum but generally gasless elsewhere. Thoracolumbar spondylosis with levoconvex lumbar scoliosis. Left hip hemiarthroplasty. Degenerative arthropathy of the right hip. Lumbar spondylosis and degenerative disc disease. Splenic artery and pelvic vascular calcifications. IMPRESSION: 1. Although the bowel gas pattern is unremarkable, much of the bowel is gasless, and accordingly does not have a pattern to further characterize. This can occasionally discitis bowel pathology. 2. Atherosclerotic aortic arch. 3. Thoracolumbar spondylosis with lumbar scoliosis. 4. Atherosclerosis. 5. Bony demineralization. Electronically Signed   By: Van Clines M.D.   On: 04/12/2015 16:42    2110:  Pt has tol PO well while in the ED without N/V, choking, gagging or coughing.  No stooling while in the ED.  Abd  remains benign, VSS. Pt continues to deny he has had CP today or at any time recently. Workup reassuring; no clear indication for admission at this time. Dx and testing d/w pt.  Questions answered.  Verb understanding, agreeable to  d/c back to NH with outpt f/u.     Francine Graven, DO 04/15/15 2104

## 2015-04-12 NOTE — Discharge Instructions (Signed)
°Emergency Department Resource Guide °1) Find a Doctor and Pay Out of Pocket °Although you won't have to find out who is covered by your insurance plan, it is a good idea to ask around and get recommendations. You will then need to call the office and see if the doctor you have chosen will accept you as a new patient and what types of options they offer for patients who are self-pay. Some doctors offer discounts or will set up payment plans for their patients who do not have insurance, but you will need to ask so you aren't surprised when you get to your appointment. ° °2) Contact Your Local Health Department °Not all health departments have doctors that can see patients for sick visits, but many do, so it is worth a call to see if yours does. If you don't know where your local health department is, you can check in your phone book. The CDC also has a tool to help you locate your state's health department, and many state websites also have listings of all of their local health departments. ° °3) Find a Walk-in Clinic °If your illness is not likely to be very severe or complicated, you may want to try a walk in clinic. These are popping up all over the country in pharmacies, drugstores, and shopping centers. They're usually staffed by nurse practitioners or physician assistants that have been trained to treat common illnesses and complaints. They're usually fairly quick and inexpensive. However, if you have serious medical issues or chronic medical problems, these are probably not your best option. ° °No Primary Care Doctor: °- Call [facility] at  832-8000 - they can help you locate a primary care doctor that  accepts your insurance, provides certain services, etc. °- Physician Referral Service- 1-800-533-3463 ° °Chronic Pain Problems: °Organization         Address  Phone   Notes  °[facility]  (336) 297-2271 Patients need to be referred by their primary care doctor.  ° °Medication  Assistance: °Organization         Address  Phone   Notes  °[place] [facility] [address] °[place], [place] [address] (336) 641-8030 --Must be a resident of [place] °-- Must have NO insurance coverage whatsoever (no Medicaid/ Medicare, etc.) °-- The pt. MUST have a primary care doctor that directs their care regularly and follows them in the community °  °[facility]  (866) 331-1348   °[employer]  (888) 892-1162   ° °Agencies that provide inexpensive medical care: °Organization         Address  Phone   Notes  °[facility]  (336) 832-8035   °[facility]    (336) 832-7272   °[facility] [address] °[place], [place] [address] (336) 832-4777   °[facility] [address], °[place] (336) 271-4999   °[facility]    (336) 373-0678   °[facility]    (336) 272-1050   °[facility] ° [address], [place] Phone:  (336) 832-4444, Fax:  (336) 832-4440 Hours of Operation:  9 am - 6 pm, M-F.  Also accepts Medicaid/Medicare and self-pay.  °[facility] ° [address], [place] Phone: (336) 832-3150, Fax: (336) 832-3151. Hours of Operation:  8:30 am - 5:30 pm, M-F.  Also accepts Medicaid and self-pay.  °[facility] [place] [address], [place] Phone: (336) 878-6027   °[facility] [address], [place], [place] (336)723-1848, Ext. 123 Mondays & Thursdays: 7-9 AM.  First 15 patients are seen on a first come, first serve basis. °  ° °Medicaid-accepting [place] Providers: ° °Organization         Address  Phone   Notes  °[facility] [address], [place] (336) 641-2100 Also accepts self-pay patients.  °[facility] [address], [place] ° (336) 856-9996   °[facility] [address], [place]  (336) 288-8857   °[facility] [address] [place] [address] (336) 299-7000   °Veita Bland [address], [place]  ° (336) 373-1557 Only accepts [place] Access Medicaid patients after they have their name applied to their card.  ° °Self-Pay (no insurance) in [place]: ° °Organization         Address  Phone   Notes  °Sickle Cell Patients, [facility] [address], [place] (336) 832-1970   °[facility] [address], [place] (336) 832-4400   °[facility] ° [address], [place] (336) 992-4800   °[facility]/Dr. Osei-Bonsu ° [address] [place] [address], [place] or [address], [place] (336) 841-8500 Phone number for both [place] and [place] locations is the same.  °[facility] [address], [place] (336) 299-0000   °[facility] [address] [place] [address], [place] or [address] (336) 852-7530 °(336) 878-2260   °[facility] [address], [place] (336) 350-1642, phone; (336) 294-5005, fax Sees patients 1st and 3rd Saturday of every month.  Must not qualify for public or private insurance (i.e. Medicaid, Medicare, [place] Health Choice, Veterans' Benefits) • Household income should be no more than 200% of the poverty level •The clinic cannot treat you if you are pregnant or think you are pregnant • Sexually transmitted diseases are not treated at the clinic.  ° ° °Dental Care: °Organization         Address  Phone  Notes  °[place] [facility] [address],  (336) 641-6152 Accepts children up to age 21 who are enrolled in Medicaid or [place] Health Choice; pregnant women with a Medicaid card; and children who have applied for Medicaid or [place] Health Choice, but were declined, whose parents can pay a reduced fee at time of service.  °[place]  [facility] [place]  [address], [place] (336) 641-7733 Accepts children up to age 21 who are enrolled in Medicaid or [place] Health Choice; pregnant women with a Medicaid card; and children who have applied for Medicaid or [place] Health Choice, but were declined, whose parents can pay a reduced fee at time of service.  °[facility] ° [address],  (336) 641-4533 Patients are seen by appointment only. Walk-ins are not accepted. [facility] will see patients 18 years of age and older. °Monday - Tuesday (8am-5pm) °Most Wednesdays (8:30-5pm) °$30 per visit, cash only  °[facility] ° [address], [place] (336) 641-4533 Patients are seen by appointment only. Walk-ins are not accepted. [facility] will see patients 18 years of age and older. °One   Wednesday Evening (Monthly: Volunteer Based).  $30 per visit, cash only  °[facility]  (919) 537-3737 for adults; Children under age 4, call Graduate Pediatric Dentistry at (919) [phone]. Children aged 4-14, please call (919) [phone] to request a pediatric application. ° Dental services are provided in all areas of dental care including fillings, crowns and bridges, complete and partial dentures, implants, gum treatment, root canals, and extractions. Preventive care is also provided. Treatment is provided to both adults and children. °Patients are selected via a lottery and there is often a waiting list. °  °[facility] [address], °[place] ° (336) 763-8833 [email or web address] °  °[facility] [address], [place], [place] (336)723-1848, Ext. 123 Second and Fourth Thursday of each month, opens at 6:30 AM; Clinic ends at 9 AM.  Patients are seen on a first-come first-served basis, and a limited number are seen during each clinic.  ° °[facility] ° [address], [place], [place] (336) 723-7904    Eligibility Requirements °You must have lived in [place], [place], or [place] counties for at least the last three months. °  You cannot be eligible for state or federal sponsored healthcare insurance, including Veterans Administration, Medicaid, or Medicare. °  You generally cannot be eligible for healthcare insurance through your employer.  °  How to apply: °Eligibility screenings are held every Tuesday and Wednesday afternoon from 1:00 pm until 4:00 pm. You do not need an appointment for the interview!  °[facility] [address], [place], [place] 336-631-2330   °[facility]  336-342-8273   °[place] [facility]  336-703-3100   °[facility]  336-570-6415   ° °Behavioral Health Resources in the Community: °Intensive Outpatient Programs °Organization         Address  Phone  Notes  °[facility] [address], [place], [place] 336-878-6098   °[facility] [address], [place], [place] 336-832-9800   °[facility] [address], [place], [place] ° 336-882-2125   °[facility] [address],  °[place], [place] 1-800-853-5163 or 336-641-4981   °Substance Abuse Resources °Organization         Address  Phone  Notes  °[facility]  336-882-2125   °[facility]  336-784-9470   °[facility]  336-285-9073   °[facility]  336-845-3988   °Residential & Outpatient Substance Abuse Program  1-800-659-3381   °Psychological Services °Organization         Address  Phone  Notes  °[facility]  336- 832-9600   °[facility]  336- 378-7881   °[facility] [address], [place] 1-800-853-5163 or 336-641-4981   ° °Mobile Crisis Teams °Organization         Address  Phone  Notes  °[facility], Mobile Crisis Care Unit  1-877-626-1772   °Assertive °[facility] ° [address]  [place], [place] 336-834-9664   °Sharon DeEsch [address] °[place] 336-554-5454   ° °Self-Help/Support Groups °Organization         Address  Phone             Notes  °[facility] - variety of support groups  336- 373-1402 Call for more information  °Narcotics Anonymous (NA), [facility] [address], °[place]   2 meetings at this location  ° °  Residential Treatment Programs °Organization         Address  Phone  Notes  °[facility] [address],    °[place]  1-866-801-8205   °[facility] ° [address], [place], [place] 704-293-8524   °[facility] [address], [place] 336-845-3988 Admissions: 8am-3pm M-F  °[facility] [address]    °[place], [place] 336-841-1104   °[facility] [address], [place], [place] 336-379-7146   °[facility] [address]  °[place], [place] 336-285-9073   °[facility] - Intensive Outpatient [address], [place], [place] 336-852-3033   °[facility] [address]  °[place], [place] 1-877-615-2722 or 336-784-9470   °[facility] [address] [place], [place] 336-227-7417 Accepts Medicaid  °[facility] [address]  °[place] 1-800-659-3381 Substance Abuse/Addiction Treatment  ° °[place] Behavioral Health Resources °Organization         Address  Phone  Notes  °[facility]  (888) 581-9988   °Julie Brannon, PhD [address] [place], [place]   (336) 349-5553 or (336) 951-0000   °[facility]   [address] °[place], [place] (336) 349-4454   °[facility] [address], [place], [place] (336) 342-8316 Insurance/Medicaid/sponsorship through [facility]  °[facility] [address]                                    [place], [place] (336) 342-8316 Therapy/tele-psych/case    °[facility] [address]  ° [place], [place] (336) 349-2233    °Dr. Arfeen  (336) 349-4544   °[facility] [place]  [employer] [place] [facility] 1) [address], [place] °2) [address], [place] °3)  [address], [place] (336) 349-3220 °(336) 342-7768 ° °(336) 342-8140   °[place] [facility] (336) 342-1394 or (336) 342-3537 (After Hours)    ° ° ° °Take your usual prescriptions as previously directed.  Call your regular medical doctor tomorrow to schedule a follow up appointment within the next 2 days. Return to the Emergency Department immediately sooner if worsening.  ° °

## 2015-04-12 NOTE — ED Notes (Signed)
Pt here from the [facility] for evaluation of nausea, chest discomfort and drooling for two days. Pt states all of his joints hurt. Denies n/vd

## 2015-04-12 NOTE — ED Notes (Signed)
Pt drank a couple sips of water and a few bites of apples sauce.  No coughing, choking, or vomiting.

## 2015-04-12 NOTE — ED Notes (Signed)
Report given to [facility] [place] [facility] and transportation requested

## 2015-04-13 LAB — URINE CULTURE

## 2015-04-15 ENCOUNTER — Other Ambulatory Visit: Payer: Self-pay | Admitting: *Deleted

## 2015-04-15 LAB — CULTURE, GROUP A STREP (THRC)

## 2015-04-15 MED ORDER — HYDROCODONE-ACETAMINOPHEN 5-325 MG PO TABS: ORAL_TABLET | ORAL | Status: DC

## 2015-04-15 NOTE — Telephone Encounter (Signed)
[facility] 

## 2015-04-24 ENCOUNTER — Non-Acute Institutional Stay (SKILLED_NURSING_FACILITY): Payer: Medicare Other | Admitting: Internal Medicine

## 2015-04-24 DIAGNOSIS — L0390 Cellulitis, unspecified: Secondary | ICD-10-CM | POA: Diagnosis not present

## 2015-04-24 DIAGNOSIS — D72829 Elevated white blood cell count, unspecified: Secondary | ICD-10-CM | POA: Diagnosis not present

## 2015-04-24 DIAGNOSIS — N289 Disorder of kidney and ureter, unspecified: Secondary | ICD-10-CM

## 2015-04-24 NOTE — Progress Notes (Signed)
Patient ID: Patrick [name], male   DOB: 12/12/1913, 82 y.o.   MRN: [id number]       This is a routine visit.  Level care skilled.  Facility [facility].  Chief complaint -- Acute visit secondary Left foot erythema--follow-up ER visit    History of present illness.  Patient is a very pleasant 82 year old male who has been quite stable-he did have a  Hospitalization  Last year for significantly elevated white count of over 50,000-this was thought to be possibly secondary to pneumonia this was treated and white count actually normalized fairly quickly.   in the past has been treated for osteomyelitis of the feet as well. He was seen recently by Dr. Dellia Nims for some erythema this responded to a course of Cipro and doxycycline-nursing staff up couple weeks ago noted some recurrent erythema here again--this appears to have responded to a course of Cipro and doxycycline  However it appears there is again some erythema of theleft distal foot as well as a second third and fourth toe-with some tenderness to palpation and slight warmth.  He is afebrile.  Patient also went to the ER on February 26 apparently with generalized complaints of body aches nausea and vomiting cough and sore throat-workup was negative for any acute process it appears CT of the abdomen was negative urine appear to be negative as well-he has return to the facility and has really not complaining of this.  I do note his white count was elevated at 14.1 we will recheck this-       Previous medical history.  History of left femur fracture status post repair.  BPH.  Restless legs.  Depression.  History of cervical DJD.  Osteoarthritis.  Osteomyelitis lower extremity.  History of basal cell carcinoma.  Diverticulosis.  Hyperlipidemia.  Right inguinal hernia.  Ischemic heart disease.  Hiatal hernia.  History of kidney stones.  History is esophageal stricture and dilation in the past.  Recurrent  TIAs.  Recurrent diverticulitis and perforation and exploratory laparotomy and sigmoid resection in June 2011.  GERD.  Hypertension.  Social history-patient previously lived in an assisted living facility today he has a very supportive son who lives in [place] distant history of tobacco use almost 50 years ago no history of alcohol abuse.  Family history is not pertinent.  Medications  Aspirin enteric-coated 81 mg daily.  Ducolax rectally and milligrams daily when necessary.  Vitamin D 2000 units daily.  Colace 100 mg daily.  Ensure daily.  Lasix 20 mg every other day.  Vicodin 5-3 25 mg-4 times a day when necessary pain.  DuoNeb nebulizers every 6 hours when necessary.  Milk of magnesia when necessary.  Melatonin 3 mg daily at bedtime when necessary.  Prilosec 20 mg daily.  Oxygen as needed via nasal cannula.  Potassium chloride 10 mEq every other day.  Requip 1.5 mg daily at bedtime.  Zoloft 50 mg daily.  Flomax 0.4 mg daily   Review of systems.  In general does not complain of any fever or chills   Skin does not complain of any rashes or itching does have increased erythema distal portion of his  leftt foot.  Eyes ears nose mouth and throat does not complain of visual changes or sore throat.  Respiratory does not complain of shortness of breath nursing staff has not really noted a significantly increased cough--  Cardiac does not complain of any chest pain palpitations--   GI is not complaining of any abdominal pain nausea vomiting  diarrhea or constipation appears to have a fairly decent appetite.  GU is not complaining of  overt dysuria does have a chronic indwelling Foley catheter   Muscle skeletal--at times will complain of foot discomfort but this does not appear grossly changed  A Neurologic is not complaining of dizziness headache syncopal-type feelings.--   Psych-has a history depression on Zoloft--this actually appears  stable       Physical exam.  Temperature 97.3 pulse 65 respirations 20 blood pressure 104/48   In general this is a pleasant elderly male in no distress sitting comfortably in his chair  His skin is warm and dry he does have known numerous solar induced changes and skin cancer removal sites these appear to be stable  Oropharynx is clear mucous membranes appear fairly moist.  Eyes pupils appear reactive to light sclera and conjunctiva are clear visual acuity appears grossly intact.  Chest shallow air entry but I could not really appreciate overt congestion there is no labored breathing . Heart  regular rate and rhythm he does have mild edema of his lower extremities bilaterally    This is baseline.  Abdomen is protuberant nontender he has a well-healed surgical scar and a right sided hernia which appears to be unchanged-does have positive bowel sounds.  GU he has a chronic indwelling catheter draining amber colored urine  Musculoskeletal is able to move all extremities 4 has some lower extremity weakness which is not new strength appears preserved he ambulates in a wheelchair without difficulty. He does have erythema the distal aspect on the top of hisleftt foot extending to the second third and fourth toes there is some tenderness to palpation of the toes as well slight warmth---  I do not note any deformity of the foot  Neurologic is grossly intact to speech is clear no lateralizing findings--touch sensation appears to be intact feet bilaterally.  Psych he is largely alert and oriented-pleasant and conversant   Labs.  04/12/2015.  Sodium 141 potassium 4.2 BUN 20 creatinine 1.18.  ALT 13 otherwise liver function tests within normal limits.  WBC 14.1 hemoglobin 11.9 platelets 310  01/05/2015.  Vitamin D level XL.8.  01/01/2059.  Sodium 136 potassium 4.6 BUN 12 creatinine 1.08.  Albumin 3.3 ALT 11 otherwise liver function tests within normal limits.  WBC  5.5 hemoglobin 11.0 platelets 177  12/15/2014.  WBC 6.9 hemoglobin 10.2 platelets 221.  Sodium 134 potassium 3.9 BUN 11 creatinine 0.86.  Albumin 3.0 ALT 12 otherwise liver function tests within normal limits  12/08/2014.  Sodium 134 potassium 4.5 BUN 12 creatinine 1.21.  12/06/2014.  AST 23-ALT 11-S1 phosphatase 79-bilirubin 0.7-albumin 2.7.  12/08/2014.  WBC 10.5-hemoglobin 10.7-platelets 229.   Assessment and plan  Left foot cellulitis-we'll treat as before with Cipro and doxycycline for a seven-day course this apparently worked pretty effectively previously- Follow-up by wound care-. #2 Also will update a CBC to look for an elevated white count as well as BMP tomorrow    #3history of acute kidney injury this appears to have stabilized for some time on lab done February 26 BUN of 20 creatinine 1.18     #4 normocytic anemia--this appears stable at 11.9 on lab done February 26  #5-CHF this appears to be baseline on low-dose Lasix every other day with potassium supplementation  Edema appears to be baseline   [id number]

## 2015-04-25 ENCOUNTER — Encounter (HOSPITAL_COMMUNITY)
Admission: RE | Admit: 2015-04-25 | Discharge: 2015-04-25 | Disposition: A | Discharge status: Home / Self Care | Payer: Medicare Other | Source: Skilled Nursing Facility | Attending: Internal Medicine | Admitting: Internal Medicine

## 2015-04-25 DIAGNOSIS — D638 Anemia in other chronic diseases classified elsewhere: Secondary | ICD-10-CM | POA: Diagnosis not present

## 2015-04-25 LAB — CBC WITH DIFFERENTIAL/PLATELET
BASOS ABS: 0 10*3/uL (ref 0.0–0.1)
Basophils Relative: 0 %
EOS ABS: 0.4 10*3/uL (ref 0.0–0.7)
Eosinophils Relative: 6 %
HCT: 32.2 % — ABNORMAL LOW (ref 39.0–52.0)
HEMOGLOBIN: 10.4 g/dL — AB (ref 13.0–17.0)
LYMPHS PCT: 30 %
Lymphs Abs: 2 10*3/uL (ref 0.7–4.0)
MCH: 28.7 pg (ref 26.0–34.0)
MCHC: 32.3 g/dL (ref 30.0–36.0)
MCV: 88.7 fL (ref 78.0–100.0)
MONO ABS: 1 10*3/uL (ref 0.1–1.0)
Monocytes Relative: 14 %
NEUTROS ABS: 3.4 10*3/uL (ref 1.7–7.7)
NEUTROS PCT: 50 %
PLATELETS: 189 10*3/uL (ref 150–400)
RBC: 3.63 MIL/uL — ABNORMAL LOW (ref 4.22–5.81)
RDW: 14.1 % (ref 11.5–15.5)
WBC: 6.8 10*3/uL (ref 4.0–10.5)

## 2015-04-25 LAB — BASIC METABOLIC PANEL
Anion gap: 5 (ref 5–15)
BUN: 18 mg/dL (ref 6–20)
CALCIUM: 8.3 mg/dL — AB (ref 8.9–10.3)
CHLORIDE: 101 mmol/L (ref 101–111)
CO2: 29 mmol/L (ref 22–32)
CREATININE: 1.09 mg/dL (ref 0.61–1.24)
GFR, EST NON AFRICAN AMERICAN: 54 mL/min — AB (ref >60)
Glucose, Bld: 106 mg/dL — ABNORMAL HIGH (ref 65–99)
Potassium: 4.9 mmol/L (ref 3.5–5.1)
SODIUM: 135 mmol/L (ref 135–145)

## 2015-04-27 ENCOUNTER — Other Ambulatory Visit (HOSPITAL_COMMUNITY)
Admission: AD | Admit: 2015-04-27 | Discharge: 2015-04-27 | Disposition: A | Discharge status: Home / Self Care | Payer: Medicare Other | Source: Skilled Nursing Facility | Attending: Internal Medicine | Admitting: Internal Medicine

## 2015-04-27 DIAGNOSIS — N390 Urinary tract infection, site not specified: Secondary | ICD-10-CM | POA: Insufficient documentation

## 2015-04-27 LAB — URINALYSIS, ROUTINE W REFLEX MICROSCOPIC
Bilirubin Urine: NEGATIVE
Glucose, UA: NEGATIVE mg/dL
KETONES UR: NEGATIVE mg/dL
NITRITE: NEGATIVE
PH: 6.5 (ref 5.0–8.0)
SPECIFIC GRAVITY, URINE: 1.01 (ref 1.005–1.030)

## 2015-04-27 LAB — URINE MICROSCOPIC-ADD ON

## 2015-04-28 ENCOUNTER — Emergency Department (HOSPITAL_COMMUNITY): Payer: Medicare Other

## 2015-04-28 ENCOUNTER — Encounter: Payer: Self-pay | Admitting: Internal Medicine

## 2015-04-28 ENCOUNTER — Encounter (HOSPITAL_COMMUNITY): Payer: Self-pay | Admitting: Emergency Medicine

## 2015-04-28 ENCOUNTER — Inpatient Hospital Stay (HOSPITAL_COMMUNITY)
Admission: EM | Admit: 2015-04-28 | Discharge: 2015-05-01 | DRG: 392 | Disposition: A | Discharge status: Skilled Nursing Facility | Payer: Medicare Other | Attending: Internal Medicine | Admitting: Internal Medicine

## 2015-04-28 ENCOUNTER — Non-Acute Institutional Stay (SKILLED_NURSING_FACILITY): Payer: Medicare Other | Admitting: Internal Medicine

## 2015-04-28 DIAGNOSIS — N179 Acute kidney failure, unspecified: Secondary | ICD-10-CM | POA: Diagnosis not present

## 2015-04-28 DIAGNOSIS — R110 Nausea: Secondary | ICD-10-CM | POA: Diagnosis present

## 2015-04-28 DIAGNOSIS — R112 Nausea with vomiting, unspecified: Secondary | ICD-10-CM | POA: Diagnosis not present

## 2015-04-28 DIAGNOSIS — R1310 Dysphagia, unspecified: Secondary | ICD-10-CM

## 2015-04-28 DIAGNOSIS — L03116 Cellulitis of left lower limb: Secondary | ICD-10-CM | POA: Diagnosis present

## 2015-04-28 DIAGNOSIS — G2581 Restless legs syndrome: Secondary | ICD-10-CM | POA: Diagnosis present

## 2015-04-28 DIAGNOSIS — Z85828 Personal history of other malignant neoplasm of skin: Secondary | ICD-10-CM

## 2015-04-28 DIAGNOSIS — T364X5A Adverse effect of tetracyclines, initial encounter: Secondary | ICD-10-CM | POA: Diagnosis present

## 2015-04-28 DIAGNOSIS — L03031 Cellulitis of right toe: Secondary | ICD-10-CM | POA: Diagnosis not present

## 2015-04-28 DIAGNOSIS — Z9981 Dependence on supplemental oxygen: Secondary | ICD-10-CM

## 2015-04-28 DIAGNOSIS — Z79899 Other long term (current) drug therapy: Secondary | ICD-10-CM

## 2015-04-28 DIAGNOSIS — L8990 Pressure ulcer of unspecified site, unspecified stage: Secondary | ICD-10-CM | POA: Diagnosis present

## 2015-04-28 DIAGNOSIS — T368X5A Adverse effect of other systemic antibiotics, initial encounter: Secondary | ICD-10-CM | POA: Diagnosis present

## 2015-04-28 DIAGNOSIS — E785 Hyperlipidemia, unspecified: Secondary | ICD-10-CM | POA: Diagnosis present

## 2015-04-28 DIAGNOSIS — M1990 Unspecified osteoarthritis, unspecified site: Secondary | ICD-10-CM | POA: Diagnosis present

## 2015-04-28 DIAGNOSIS — N10 Acute tubulo-interstitial nephritis: Secondary | ICD-10-CM

## 2015-04-28 DIAGNOSIS — Z87442 Personal history of urinary calculi: Secondary | ICD-10-CM

## 2015-04-28 DIAGNOSIS — Z7982 Long term (current) use of aspirin: Secondary | ICD-10-CM

## 2015-04-28 DIAGNOSIS — Z8673 Personal history of transient ischemic attack (TIA), and cerebral infarction without residual deficits: Secondary | ICD-10-CM

## 2015-04-28 DIAGNOSIS — N390 Urinary tract infection, site not specified: Secondary | ICD-10-CM | POA: Diagnosis present

## 2015-04-28 DIAGNOSIS — Z66 Do not resuscitate: Secondary | ICD-10-CM | POA: Diagnosis present

## 2015-04-28 DIAGNOSIS — K5900 Constipation, unspecified: Secondary | ICD-10-CM | POA: Diagnosis present

## 2015-04-28 DIAGNOSIS — L03039 Cellulitis of unspecified toe: Secondary | ICD-10-CM

## 2015-04-28 DIAGNOSIS — L0390 Cellulitis, unspecified: Secondary | ICD-10-CM | POA: Diagnosis present

## 2015-04-28 DIAGNOSIS — F419 Anxiety disorder, unspecified: Secondary | ICD-10-CM | POA: Diagnosis present

## 2015-04-28 DIAGNOSIS — K219 Gastro-esophageal reflux disease without esophagitis: Secondary | ICD-10-CM | POA: Diagnosis present

## 2015-04-28 DIAGNOSIS — I10 Essential (primary) hypertension: Secondary | ICD-10-CM | POA: Diagnosis present

## 2015-04-28 DIAGNOSIS — L02619 Cutaneous abscess of unspecified foot: Secondary | ICD-10-CM

## 2015-04-28 LAB — LACTIC ACID, PLASMA
Lactic Acid, Venous: 1.5 mmol/L (ref 0.5–2.0)
Lactic Acid, Venous: 1.2 mmol/L (ref 0.5–2.0)

## 2015-04-28 LAB — CBC WITH DIFFERENTIAL/PLATELET
Basophils Absolute: 0 10*3/uL (ref 0.0–0.1)
Basophils Relative: 0 %
Eosinophils Absolute: 0 10*3/uL (ref 0.0–0.7)
Eosinophils Relative: 0 %
HCT: 35.2 % — ABNORMAL LOW (ref 39.0–52.0)
HEMOGLOBIN: 11.7 g/dL — AB (ref 13.0–17.0)
LYMPHS ABS: 1.5 10*3/uL (ref 0.7–4.0)
LYMPHS PCT: 14 %
MCH: 28.9 pg (ref 26.0–34.0)
MCHC: 33.2 g/dL (ref 30.0–36.0)
MCV: 86.9 fL (ref 78.0–100.0)
Monocytes Absolute: 0.6 10*3/uL (ref 0.1–1.0)
Monocytes Relative: 5 %
NEUTROS PCT: 81 %
Neutro Abs: 8.3 10*3/uL — ABNORMAL HIGH (ref 1.7–7.7)
Platelets: 232 10*3/uL (ref 150–400)
RBC: 4.05 MIL/uL — AB (ref 4.22–5.81)
RDW: 14.2 % (ref 11.5–15.5)
WBC: 10.4 10*3/uL (ref 4.0–10.5)

## 2015-04-28 LAB — COMPREHENSIVE METABOLIC PANEL
ALT: 15 U/L — AB (ref 17–63)
AST: 24 U/L (ref 15–41)
Albumin: 3.6 g/dL (ref 3.5–5.0)
Alkaline Phosphatase: 94 U/L (ref 38–126)
Anion gap: 9 (ref 5–15)
BUN: 19 mg/dL (ref 6–20)
CHLORIDE: 100 mmol/L — AB (ref 101–111)
CO2: 28 mmol/L (ref 22–32)
CREATININE: 1.27 mg/dL — AB (ref 0.61–1.24)
Calcium: 8.8 mg/dL — ABNORMAL LOW (ref 8.9–10.3)
GFR calc Af Amer: 52 mL/min — ABNORMAL LOW (ref >60)
GFR calc non Af Amer: 45 mL/min — ABNORMAL LOW (ref >60)
GLUCOSE: 118 mg/dL — AB (ref 65–99)
Potassium: 4.8 mmol/L (ref 3.5–5.1)
SODIUM: 137 mmol/L (ref 135–145)
Total Bilirubin: 0.7 mg/dL (ref 0.3–1.2)
Total Protein: 7.2 g/dL (ref 6.5–8.1)

## 2015-04-28 LAB — URINALYSIS, ROUTINE W REFLEX MICROSCOPIC
Bilirubin Urine: NEGATIVE
GLUCOSE, UA: NEGATIVE mg/dL
Ketones, ur: NEGATIVE mg/dL
Nitrite: NEGATIVE
SPECIFIC GRAVITY, URINE: 1.015 (ref 1.005–1.030)
pH: 7 (ref 5.0–8.0)

## 2015-04-28 LAB — LIPASE, BLOOD: Lipase: 16 U/L (ref 11–51)

## 2015-04-28 LAB — TROPONIN I: Troponin I: <0.03 ng/mL (ref <0.031)

## 2015-04-28 LAB — URINE MICROSCOPIC-ADD ON

## 2015-04-28 MED ORDER — FAMOTIDINE IN NACL 20-0.9 MG/50ML-% IV SOLN
20.0000 mg | Freq: Once | INTRAVENOUS | Status: AC
  Administered 2015-04-28: 20 mg via INTRAVENOUS
  Filled 2015-04-28: qty 50

## 2015-04-28 MED ORDER — ONDANSETRON HCL 4 MG/2ML IJ SOLN: 4.0000 mg | Freq: Three times a day (TID) | INTRAMUSCULAR | Status: AC | PRN

## 2015-04-28 MED ORDER — MORPHINE SULFATE (PF) 2 MG/ML IV SOLN
1.0000 mg | INTRAVENOUS | Status: DC | PRN
  Administered 2015-04-29 (×2): 1 mg via INTRAVENOUS
  Filled 2015-04-28 (×2): qty 1

## 2015-04-28 MED ORDER — IPRATROPIUM-ALBUTEROL 0.5-2.5 (3) MG/3ML IN SOLN: 3.0000 mL | Freq: Four times a day (QID) | RESPIRATORY_TRACT | Status: DC | PRN

## 2015-04-28 MED ORDER — SODIUM CHLORIDE 0.9 % IV SOLN
INTRAVENOUS | Status: DC
  Administered 2015-04-28 – 2015-04-29 (×2): via INTRAVENOUS

## 2015-04-28 MED ORDER — SODIUM CHLORIDE 0.9 % IV BOLUS (SEPSIS)
500.0000 mL | Freq: Once | INTRAVENOUS | Status: AC
  Administered 2015-04-28: 500 mL via INTRAVENOUS

## 2015-04-28 MED ORDER — SODIUM CHLORIDE 0.9 % IV SOLN
INTRAVENOUS | Status: DC
  Administered 2015-04-28: 17:00:00 via INTRAVENOUS

## 2015-04-28 MED ORDER — MORPHINE SULFATE (PF) 2 MG/ML IV SOLN
1.0000 mg | Freq: Once | INTRAVENOUS | Status: AC
  Administered 2015-04-28: 1 mg via INTRAVENOUS
  Filled 2015-04-28: qty 1

## 2015-04-28 MED ORDER — BISACODYL 10 MG RE SUPP: 10.0000 mg | RECTAL | Status: DC | PRN

## 2015-04-28 MED ORDER — DEXTROSE 5 % IV SOLN
1.0000 g | Freq: Once | INTRAVENOUS | Status: AC
  Administered 2015-04-28: 1 g via INTRAVENOUS
  Filled 2015-04-28: qty 10

## 2015-04-28 MED ORDER — ONDANSETRON HCL 4 MG PO TABS: 4.0000 mg | ORAL_TABLET | Freq: Four times a day (QID) | ORAL | Status: DC | PRN

## 2015-04-28 MED ORDER — ONDANSETRON HCL 4 MG/2ML IJ SOLN
4.0000 mg | INTRAMUSCULAR | Status: AC | PRN
Start: 2015-04-28 — End: 2015-04-28
  Administered 2015-04-28 (×2): 4 mg via INTRAVENOUS
  Filled 2015-04-28 (×2): qty 2

## 2015-04-28 MED ORDER — ENOXAPARIN SODIUM 30 MG/0.3ML ~~LOC~~ SOLN
30.0000 mg | SUBCUTANEOUS | Status: DC
  Administered 2015-04-28 – 2015-04-30 (×3): 30 mg via SUBCUTANEOUS
  Filled 2015-04-28 (×3): qty 0.3

## 2015-04-28 MED ORDER — ONDANSETRON HCL 4 MG/2ML IJ SOLN
4.0000 mg | Freq: Four times a day (QID) | INTRAMUSCULAR | Status: DC | PRN
  Administered 2015-04-29: 4 mg via INTRAVENOUS
  Filled 2015-04-28: qty 2

## 2015-04-28 NOTE — ED Notes (Signed)
Pt is able to maintain salvia at this time, can lick top and bottom lip, and cough on command. When given a drink via cup pt swallowed some, while a small amount fell from right side of the mouth and pt shortly after became N/ and V/ clear sputum. MD Mcmanus notified at this time. Pt failed swallow screen.

## 2015-04-28 NOTE — ED Notes (Signed)
[facility] from [facility], has been on antibiotics (cipro and doxycycline) and has since been having trouble swallowing, not able to keep food down, emesis today after eating cracker and gingerale.  Renee from [facility] reports that he has had trouble swallowing in past when he is taking antibiotics.

## 2015-04-28 NOTE — ED Notes (Signed)
Per MD perform fluid challenge, stated pt failed stroke screen earlier r/t N/, repeat at this time.

## 2015-04-28 NOTE — H&P (Signed)
PCP:   Glenda Chroman., MD   Chief Complaint:  Nausea  HPI: 82 year old male who   has a past medical history of Anxiety; HTN (hypertension); Reflux; Skin cancer, basal cell; Diverticulitis; Hyperlipidemia; Inguinal hernia; Ischemic heart disease; Hiatal hernia; Kidney stone; Esophageal stricture; History of recurrent TIAs; Restless leg syndrome; GERD (gastroesophageal reflux disease); Pleural effusion; CHF (congestive heart failure) ([place]); Urinary retention; DNR (do not resuscitate); DJD (degenerative joint disease), cervical; Osteoarthritis; Gout; Right hip pain; and Bilateral foot pain. Patient was brought to the hospital from skilled facility after he was unable to eat or drink anything. Patient was started on antibiotic Cipro and doxycycline for cellulitis of foot and started having symptoms of nausea. He did not have vomiting or diarrhea. Denies abdominal pain. No chest or shortness of breath. Patient also was diagnosed with UTI and incidentally urine culture results on 04/12/2015 showed contamination. Again today UA was done which showed negative nitrite, too numerous to count WBC. Patient denies dysuria.  Allergies:   Allergies  Allergen Reactions  . Celebrex [Celecoxib] Other (See Comments)    unknown  . Codeine Other (See Comments)    unknown      Past Medical History  Diagnosis Date  . Anxiety   . HTN (hypertension)   . Reflux   . Skin cancer, basal cell   . Diverticulitis   . Hyperlipidemia   . Inguinal hernia   . Ischemic heart disease   . Hiatal hernia   . Kidney stone   . Esophageal stricture   . History of recurrent TIAs   . Restless leg syndrome   . GERD (gastroesophageal reflux disease)   . Pleural effusion   . CHF (congestive heart failure) ([place])   . Urinary retention   . DNR (do not resuscitate)   . DJD (degenerative joint disease), cervical   . Osteoarthritis     bilat knees  . Gout   . Right hip pain   . Bilateral foot pain     Past Surgical  History  Procedure Laterality Date  . Intestines    . Exploratory laparotomy w/ bowel resection    . Esophageal dilation    . Fracture surgery    . Hemiarthroplasty hip      Prior to Admission medications   Medication Sig Start Date End Date Taking? Authorizing Provider  Amino Acids-Protein Hydrolys (FEEDING SUPPLEMENT, PRO-STAT SUGAR FREE 64,) LIQD Take 30 mLs by mouth 2 (two) times daily.   Yes Historical Provider, MD  aspirin EC 81 MG tablet Take 81 mg by mouth daily.   Yes Historical Provider, MD  bisacodyl (DULCOLAX) 10 MG suppository Place 10 mg rectally as needed for mild constipation or moderate constipation.   Yes Historical Provider, MD  Cholecalciferol (VITAMIN D) 2000 UNITS CAPS Take 1 capsule by mouth daily.   Yes Historical Provider, MD  ciprofloxacin (CIPRO) 250 MG tablet Take 250 mg by mouth 2 (two) times daily.   Yes Historical Provider, MD  docusate sodium (COLACE) 100 MG capsule Take 100 mg by mouth daily. For constipation.   Yes Historical Provider, MD  doxycycline (VIBRAMYCIN) 100 MG capsule Take 100 mg by mouth 2 (two) times daily.   Yes Historical Provider, MD  furosemide (LASIX) 20 MG tablet Take 20 mg by mouth every other day. For edema.   Yes Historical Provider, MD  gabapentin (NEURONTIN) 100 MG capsule Take 100 mg by mouth at bedtime.   Yes Historical Provider, MD  HYDROcodone-acetaminophen (NORCO/VICODIN) 5-325 MG tablet Take  one tablet by mouth four times a day as needed for pain DO NOT EXCEED 3 GM APAP IN 24 HOURS FROM ALL SOURCES 04/15/15  Yes Gildardo Cranker, DO  ipratropium-albuterol (DUONEB) 0.5-2.5 (3) MG/3ML SOLN Take 3 mLs by nebulization every 6 (six) hours as needed (shortness of breath).   Yes Historical Provider, MD  Melatonin 3 MG CAPS Take 3 mg by mouth at bedtime as needed (for sleep).   Yes Historical Provider, MD  omeprazole (PRILOSEC) 20 MG capsule Take 20 mg by mouth daily.   Yes Historical Provider, MD  potassium chloride (K-DUR,KLOR-CON) 10 MEQ  tablet Take 10 mEq by mouth every other day.    Yes Historical Provider, MD  promethazine (PHENERGAN) 12.5 MG tablet Take 12.5 mg by mouth every 6 (six) hours as needed for nausea or vomiting.   Yes Historical Provider, MD  rOPINIRole (REQUIP) 1 MG tablet Take 1.5 mg by mouth at bedtime.    Yes Historical Provider, MD  sertraline (ZOLOFT) 50 MG tablet Take 50 mg by mouth at bedtime.    Yes Historical Provider, MD  sodium phosphate (FLEET) enema Place 1 enema rectally once. follow package directions   Yes Historical Provider, MD  tamsulosin (FLOMAX) 0.4 MG CAPS capsule Take 0.4 mg by mouth daily.   Yes Historical Provider, MD  magnesium hydroxide (MILK OF MAGNESIA) 400 MG/5ML suspension Take 30 mLs by mouth daily as needed for mild constipation. If no relief try a Fleets enema, after 2 days.    Historical Provider, MD  OXYGEN Inhale 2 L into the lungs continuous.    Historical Provider, MD    Social History:  reports that he has never smoked. He does not have any smokeless tobacco history on file. He reports that he does not drink alcohol or use illicit drugs.  Family History  Problem Relation Age of Onset  . Arthritis      All the positives are listed in BOLD  Review of Systems:  HEENT: Headache, blurred vision, runny nose, sore throat Neck: Hypothyroidism, hyperthyroidism,,lymphadenopathy Chest : Shortness of breath, history of COPD, Asthma Heart : Chest pain, history of coronary arterey disease GI:  Nausea, vomiting, diarrhea, constipation, GERD GU: Dysuria, urgency, frequency of urination, hematuria Neuro: Stroke, seizures, syncope Psych: Depression, anxiety, hallucinations   Physical Exam: Blood pressure 121/46, pulse 83, temperature 98.3 F (36.8 C), temperature source Oral, resp. rate 17, SpO2 95 %. Constitutional:   Patient is a well-developed and well-nourished male in no acute distress and cooperative with exam. Head: Normocephalic and atraumatic Mouth: Mucus membranes  moist Eyes: PERRL, EOMI, conjunctivae normal Neck: Supple, No Thyromegaly Cardiovascular: RRR, S1 normal, S2 normal Pulmonary/Chest: CTAB, no wheezes, rales, or rhonchi Abdominal: Soft. Non-tender, non-distended, bowel sounds are normal, no masses, organomegaly, or guarding present.  Neurological: A&O x3, Strength is normal and symmetric bilaterally, cranial nerve II-XII are grossly intact, no focal motor deficit, sensory intact to light touch bilaterally.  Extremities : Erythema and tenderness elicited on the dorsum of the left foot  Labs on Admission:  Basic Metabolic Panel:  Recent Labs Lab 04/25/15 0427 04/28/15 1655  NA 135 137  K 4.9 4.8  CL 101 100*  CO2 29 28  GLUCOSE 106* 118*  BUN 18 19  CREATININE 1.09 1.27*  CALCIUM 8.3* 8.8*   Liver Function Tests:  Recent Labs Lab 04/28/15 1655  AST 24  ALT 15*  ALKPHOS 94  BILITOT 0.7  PROT 7.2  ALBUMIN 3.6    Recent Labs Lab 04/28/15  1655  LIPASE 16   CBC:  Recent Labs Lab 04/25/15 0427 04/28/15 1655  WBC 6.8 10.4  NEUTROABS 3.4 8.3*  HGB 10.4* 11.7*  HCT 32.2* 35.2*  MCV 88.7 86.9  PLT 189 232   Cardiac Enzymes:  Recent Labs Lab 04/28/15 1655  TROPONINI <0.03    BNP (last 3 results)  Recent Labs  04/12/15 1535  BNP 245.0*     Radiological Exams on Admission: Ct Head Wo Contrast  04/28/2015  CLINICAL DATA:  Acute onset of difficulty swallowing. Initial encounter. EXAM: CT HEAD WITHOUT CONTRAST TECHNIQUE: Contiguous axial images were obtained from the base of the skull through the vertex without intravenous contrast. COMPARISON:  CT of the head performed 02/23/2013, and MRI of the brain performed 08/23/2013 FINDINGS: There is no evidence of acute infarction, mass lesion, or intra- or extra-axial hemorrhage on CT. Prominence of the ventricles and sulci reflects moderate cortical volume loss. Cerebellar atrophy is noted. Scattered periventricular and subcortical white matter change likely  reflects small vessel ischemic microangiopathy. A few chronic lacunar infarcts are seen at the left cerebellar hemisphere. The brainstem and fourth ventricle are within normal limits. The basal ganglia are unremarkable in appearance. The cerebral hemispheres demonstrate grossly normal gray-white differentiation. No mass effect or midline shift is seen. There is no evidence of fracture; visualized osseous structures are unremarkable in appearance. The orbits are within normal limits. The paranasal sinuses and mastoid air cells are well-aerated. No significant soft tissue abnormalities are seen. IMPRESSION: 1. No acute intracranial pathology seen on CT. 2. Moderate cortical volume loss and scattered small vessel ischemic microangiopathy. 3. Few chronic lacunar infarcts at the left cerebellar hemisphere. Electronically Signed   By: Garald Balding M.D.   On: 04/28/2015 20:57   Dg Abd Acute W/chest  04/28/2015  CLINICAL DATA:  Nausea, vomiting. EXAM: DG ABDOMEN ACUTE W/ 1V CHEST COMPARISON:  April 12, 2015. FINDINGS: There is no evidence of dilated bowel loops or free intraperitoneal air. Phleboliths are noted in the pelvis. Heart size and mediastinal contours are within normal limits. Both lungs are clear. IMPRESSION: No evidence of bowel obstruction or ileus. No acute cardiopulmonary disease. Electronically Signed   By: Marijo Conception, M.D.   On: 04/28/2015 17:50    EKG: Independently reviewed. Normal sinus rhythm   Assessment/Plan Active Problems:   UTI (urinary tract infection)   Cellulitis   AKI (acute kidney injury) (HCC)   Dysphagia   Nausea  Left foot cellulitis We'll start the patient on IV cefazolin per pharmacy consultation.  Nausea Seconded to by mouth antibiotics doxycycline as well as ciprofloxacin which are currently on hold. There was concern for aspiration. Will obtain swallow evaluation. Will keep patient nothing by mouth.  By mouth medications Will hold by mouth medications  due to above. After swallow evaluation, if normal consider starting back on home medications in a.m.  ? UTI Patient had urine culture done on 04/12/2015, which showed multiple organisms. UA again today shows too numerous to count WBC Will start IV cefazolin as above. Follow urine culture results.  Acute kidney injury Patient has mild acute kidney injury with creatinine 1.27 Start gentle IV hydration with normal saline at 50 mL per hour  DVT prophylaxis Lovenox   Code status: DO NOT RESUSCITATE  Family discussion: No family present at bedside   Time Spent on Admission: 39 min  [facility] Pager: [phone] 04/28/2015, 9:45 PM  If 7PM-7AM, please contact night-coverage  [email or web address]  Password [email or web address]

## 2015-04-28 NOTE — ED Notes (Signed)
Pt is taking antibiotics for cellulitis on right foot.

## 2015-04-28 NOTE — ED Notes (Signed)
DNR at pt's bedside. Cellulitis noted on right second and third toe, and left great and third toe.

## 2015-04-28 NOTE — ED Provider Notes (Signed)
CSN: [id number]     Arrival date & time 04/28/15  1545 History   First MD Initiated Contact with Patient 04/28/15 1623     Chief Complaint  Patient presents with  . Dysphagia  . Nausea  . Emesis      HPI Pt was seen at 1630. Per EMS, NH report and pt, c/o gradual onset and persistence of multiple intermittent episodes of N/V that began this morning. Pt states he "can't keep anything down." NH states pt is currently taking abx for cellulitis of his foot (cipro, doxycycline) and has had these symptoms previously when taking abx. Denies abd pain, no CP/SOB, no back pain, no fevers, no black or blood in stools or emesis.     Past Medical History  Diagnosis Date  . Anxiety   . HTN (hypertension)   . Reflux   . Skin cancer, basal cell   . Diverticulitis   . Hyperlipidemia   . Inguinal hernia   . Ischemic heart disease   . Hiatal hernia   . Kidney stone   . Esophageal stricture   . History of recurrent TIAs   . Restless leg syndrome   . GERD (gastroesophageal reflux disease)   . Pleural effusion   . CHF (congestive heart failure) ([place])   . Urinary retention   . DNR (do not resuscitate)   . DJD (degenerative joint disease), cervical   . Osteoarthritis     bilat knees  . Gout   . Right hip pain   . Bilateral foot pain    Past Surgical History  Procedure Laterality Date  . Intestines    . Exploratory laparotomy w/ bowel resection    . Esophageal dilation    . Fracture surgery    . Hemiarthroplasty hip     Family History  Problem Relation Age of Onset  . Arthritis     Social History  Substance Use Topics  . Smoking status: Never Smoker   . Smokeless tobacco: None  . Alcohol Use: No    Review of Systems ROS: Statement: All systems negative except as marked or noted in the HPI; Constitutional: Negative for fever and chills. ; ; Eyes: Negative for eye pain, redness and discharge. ; ; ENMT: Negative for ear pain, hoarseness, nasal congestion, sinus pressure and sore  throat. ; ; Cardiovascular: Negative for chest pain, palpitations, diaphoresis, dyspnea and peripheral edema. ; ; Respiratory: Negative for cough, wheezing and stridor. ; ; Gastrointestinal: +N/V. Negative for diarrhea, abdominal pain, blood in stool, hematemesis, jaundice and rectal bleeding. . ; ; Genitourinary: Negative for dysuria, flank pain and hematuria. ; ; Musculoskeletal: Negative for back pain and neck pain. Negative for swelling and trauma.; ; Skin: Negative for pruritus, rash, abrasions, blisters, bruising and skin lesion.; ; Neuro: Negative for headache, lightheadedness and neck stiffness. Negative for weakness, altered level of consciousness , altered mental status, extremity weakness, paresthesias, involuntary movement, seizure and syncope.      Allergies  Celebrex and Codeine  Home Medications   Prior to Admission medications   Medication Sig Start Date End Date Taking? Authorizing Provider  Amino Acids-Protein Hydrolys (FEEDING SUPPLEMENT, PRO-STAT SUGAR FREE 64,) LIQD Take 30 mLs by mouth 2 (two) times daily.   Yes Historical Provider, MD  aspirin EC 81 MG tablet Take 81 mg by mouth daily.   Yes Historical Provider, MD  bisacodyl (DULCOLAX) 10 MG suppository Place 10 mg rectally as needed for mild constipation or moderate constipation.   Yes Historical Provider, MD  Cholecalciferol (VITAMIN D) 2000 UNITS CAPS Take 1 capsule by mouth daily.   Yes Historical Provider, MD  ciprofloxacin (CIPRO) 250 MG tablet Take 250 mg by mouth 2 (two) times daily.   Yes Historical Provider, MD  docusate sodium (COLACE) 100 MG capsule Take 100 mg by mouth daily. For constipation.   Yes Historical Provider, MD  doxycycline (VIBRAMYCIN) 100 MG capsule Take 100 mg by mouth 2 (two) times daily.   Yes Historical Provider, MD  furosemide (LASIX) 20 MG tablet Take 20 mg by mouth every other day. For edema.   Yes Historical Provider, MD  gabapentin (NEURONTIN) 100 MG capsule Take 100 mg by mouth at  bedtime.   Yes Historical Provider, MD  HYDROcodone-acetaminophen (NORCO/VICODIN) 5-325 MG tablet Take one tablet by mouth four times a day as needed for pain DO NOT EXCEED 3 GM APAP IN 24 HOURS FROM ALL SOURCES 04/15/15  Yes Gildardo Cranker, DO  ipratropium-albuterol (DUONEB) 0.5-2.5 (3) MG/3ML SOLN Take 3 mLs by nebulization every 6 (six) hours as needed (shortness of breath).   Yes Historical Provider, MD  Melatonin 3 MG CAPS Take 3 mg by mouth at bedtime as needed (for sleep).   Yes Historical Provider, MD  omeprazole (PRILOSEC) 20 MG capsule Take 20 mg by mouth daily.   Yes Historical Provider, MD  potassium chloride (K-DUR,KLOR-CON) 10 MEQ tablet Take 10 mEq by mouth every other day.    Yes Historical Provider, MD  promethazine (PHENERGAN) 12.5 MG tablet Take 12.5 mg by mouth every 6 (six) hours as needed for nausea or vomiting.   Yes Historical Provider, MD  rOPINIRole (REQUIP) 1 MG tablet Take 1.5 mg by mouth at bedtime.    Yes Historical Provider, MD  sertraline (ZOLOFT) 50 MG tablet Take 50 mg by mouth at bedtime.    Yes Historical Provider, MD  sodium phosphate (FLEET) enema Place 1 enema rectally once. follow package directions   Yes Historical Provider, MD  tamsulosin (FLOMAX) 0.4 MG CAPS capsule Take 0.4 mg by mouth daily.   Yes Historical Provider, MD  magnesium hydroxide (MILK OF MAGNESIA) 400 MG/5ML suspension Take 30 mLs by mouth daily as needed for mild constipation. If no relief try a Fleets enema, after 2 days.    Historical Provider, MD  OXYGEN Inhale 2 L into the lungs continuous.    Historical Provider, MD   BP 157/67 mmHg  Pulse 87  Temp(Src) 97.9 F (36.6 C) (Oral)  Resp 19  SpO2 100% Physical Exam  1635: Physical examination:  Nursing notes reviewed; Vital signs and O2 SAT reviewed;  Constitutional: Well developed, Well nourished, Well hydrated, In no acute distress; Head:  Normocephalic, atraumatic; Eyes: EOMI, PERRL, No scleral icterus; ENMT: Mouth and pharynx normal,  Mucous membranes moist; Neck: Supple, Full range of motion, No lymphadenopathy; Cardiovascular: Regular rate and rhythm, No gallop; Respiratory: Breath sounds clear & equal bilaterally, No wheezes.  Speaking full sentences with ease, Normal respiratory effort/excursion; Chest: Nontender, Movement normal; Abdomen: Soft, Nontender, Nondistended, Normal bowel sounds; Genitourinary: No CVA tenderness; Extremities: Pulses normal, No tenderness, No edema, No calf edema or asymmetry.; Neuro: AA&Ox3, Major CN grossly intact. No facial droop. Speech clear. Moves extremities on stretcher..; Skin: Color normal, Warm, Dry.   ED Course  Procedures (including critical care time) Labs Review   Imaging Review  I have personally reviewed and evaluated these images and lab results as part of my medical decision-making.   EKG Interpretation None      MDM  MDM  Reviewed: previous chart, nursing note and vitals Reviewed previous: labs and ECG Interpretation: labs, ECG and x-ray      Results for orders placed or performed during the hospital encounter of 04/28/15  Comprehensive metabolic panel  Result Value Ref Range   Sodium 137 135 - 145 mmol/L   Potassium 4.8 3.5 - 5.1 mmol/L   Chloride 100 (L) 101 - 111 mmol/L   CO2 28 22 - 32 mmol/L   Glucose, Bld 118 (H) 65 - 99 mg/dL   BUN 19 6 - 20 mg/dL   Creatinine, Ser 1.27 (H) 0.61 - 1.24 mg/dL   Calcium 8.8 (L) 8.9 - 10.3 mg/dL   Total Protein 7.2 6.5 - 8.1 g/dL   Albumin 3.6 3.5 - 5.0 g/dL   AST 24 15 - 41 U/L   ALT 15 (L) 17 - 63 U/L   Alkaline Phosphatase 94 38 - 126 U/L   Total Bilirubin 0.7 0.3 - 1.2 mg/dL   GFR calc non Af Amer 45 (L) >60 mL/min   GFR calc Af Amer 52 (L) >60 mL/min   Anion gap 9 5 - 15  Lipase, blood  Result Value Ref Range   Lipase 16 11 - 51 U/L  Troponin I  Result Value Ref Range   Troponin I <0.03 <0.031 ng/mL  Lactic acid, plasma  Result Value Ref Range   Lactic Acid, Venous 1.5 0.5 - 2.0 mmol/L  CBC with  Differential  Result Value Ref Range   WBC 10.4 4.0 - 10.5 K/uL   RBC 4.05 (L) 4.22 - 5.81 MIL/uL   Hemoglobin 11.7 (L) 13.0 - 17.0 g/dL   HCT 35.2 (L) 39.0 - 52.0 %   MCV 86.9 78.0 - 100.0 fL   MCH 28.9 26.0 - 34.0 pg   MCHC 33.2 30.0 - 36.0 g/dL   RDW 14.2 11.5 - 15.5 %   Platelets 232 150 - 400 K/uL   Neutrophils Relative % 81 %   Neutro Abs 8.3 (H) 1.7 - 7.7 K/uL   Lymphocytes Relative 14 %   Lymphs Abs 1.5 0.7 - 4.0 K/uL   Monocytes Relative 5 %   Monocytes Absolute 0.6 0.1 - 1.0 K/uL   Eosinophils Relative 0 %   Eosinophils Absolute 0.0 0.0 - 0.7 K/uL   Basophils Relative 0 %   Basophils Absolute 0.0 0.0 - 0.1 K/uL  Urinalysis, Routine w reflex microscopic  Result Value Ref Range   Color, Urine YELLOW YELLOW   APPearance CLEAR CLEAR   Specific Gravity, Urine 1.015 1.005 - 1.030   pH 7.0 5.0 - 8.0   Glucose, UA NEGATIVE NEGATIVE mg/dL   Hgb urine dipstick MODERATE (A) NEGATIVE   Bilirubin Urine NEGATIVE NEGATIVE   Ketones, ur NEGATIVE NEGATIVE mg/dL   Protein, ur TRACE (A) NEGATIVE mg/dL   Nitrite NEGATIVE NEGATIVE   Leukocytes, UA LARGE (A) NEGATIVE  Urine microscopic-add on  Result Value Ref Range   Squamous Epithelial / LPF 6-30 (A) NONE SEEN   WBC, UA TOO NUMEROUS TO COUNT 0 - 5 WBC/hpf   RBC / HPF 0-5 0 - 5 RBC/hpf   Bacteria, UA MANY (A) NONE SEEN   Dg Abd Acute W/chest 04/28/2015  CLINICAL DATA:  Nausea, vomiting. EXAM: DG ABDOMEN ACUTE W/ 1V CHEST COMPARISON:  April 12, 2015. FINDINGS: There is no evidence of dilated bowel loops or free intraperitoneal air. Phleboliths are noted in the pelvis. Heart size and mediastinal contours are within normal limits. Both lungs are clear. IMPRESSION: No  evidence of bowel obstruction or ileus. No acute cardiopulmonary disease. Electronically Signed   By: Marijo Conception, M.D.   On: 04/28/2015 17:50    2000:  Pt initially stated he "didn't want to drink anything" because he was nauseated. IV zofran and pepcid given; pt  took a nap. PO challenge attempted again; pt will not swallow (holds liquid in mouth and spits out). Will obtain CT-H for completeness (r/o CVA). +UTI, UC pending; will dose IV rocephin. IVF given for elevated BUN/Cr from baseline. T/C to [facility] Dr. Darrick Meigs, case discussed, including:  HPI, pertinent PM/SHx, VS/PE, dx testing, ED course and treatment:  Agreeable to admit, requests to write temporary orders, obtain observation medical bed to team APAdmits.   Francine Graven, DO 05/01/15 1751

## 2015-04-29 ENCOUNTER — Encounter: Payer: Self-pay | Admitting: Internal Medicine

## 2015-04-29 DIAGNOSIS — T17320A Food in larynx causing asphyxiation, initial encounter: Secondary | ICD-10-CM | POA: Diagnosis not present

## 2015-04-29 DIAGNOSIS — K219 Gastro-esophageal reflux disease without esophagitis: Secondary | ICD-10-CM | POA: Diagnosis present

## 2015-04-29 DIAGNOSIS — L8990 Pressure ulcer of unspecified site, unspecified stage: Secondary | ICD-10-CM | POA: Diagnosis present

## 2015-04-29 DIAGNOSIS — N179 Acute kidney failure, unspecified: Secondary | ICD-10-CM | POA: Diagnosis present

## 2015-04-29 DIAGNOSIS — R1310 Dysphagia, unspecified: Secondary | ICD-10-CM | POA: Diagnosis present

## 2015-04-29 DIAGNOSIS — G2581 Restless legs syndrome: Secondary | ICD-10-CM | POA: Diagnosis present

## 2015-04-29 DIAGNOSIS — E785 Hyperlipidemia, unspecified: Secondary | ICD-10-CM | POA: Diagnosis present

## 2015-04-29 DIAGNOSIS — L03113 Cellulitis of right upper limb: Secondary | ICD-10-CM | POA: Diagnosis not present

## 2015-04-29 DIAGNOSIS — T364X5A Adverse effect of tetracyclines, initial encounter: Secondary | ICD-10-CM | POA: Diagnosis present

## 2015-04-29 DIAGNOSIS — Z79899 Other long term (current) drug therapy: Secondary | ICD-10-CM | POA: Diagnosis not present

## 2015-04-29 DIAGNOSIS — K5900 Constipation, unspecified: Secondary | ICD-10-CM | POA: Diagnosis present

## 2015-04-29 DIAGNOSIS — R110 Nausea: Secondary | ICD-10-CM | POA: Diagnosis present

## 2015-04-29 DIAGNOSIS — Z85828 Personal history of other malignant neoplasm of skin: Secondary | ICD-10-CM | POA: Diagnosis not present

## 2015-04-29 DIAGNOSIS — F419 Anxiety disorder, unspecified: Secondary | ICD-10-CM | POA: Diagnosis present

## 2015-04-29 DIAGNOSIS — N390 Urinary tract infection, site not specified: Secondary | ICD-10-CM | POA: Diagnosis present

## 2015-04-29 DIAGNOSIS — Z66 Do not resuscitate: Secondary | ICD-10-CM | POA: Diagnosis present

## 2015-04-29 DIAGNOSIS — Z8673 Personal history of transient ischemic attack (TIA), and cerebral infarction without residual deficits: Secondary | ICD-10-CM | POA: Diagnosis not present

## 2015-04-29 DIAGNOSIS — L03116 Cellulitis of left lower limb: Secondary | ICD-10-CM | POA: Diagnosis present

## 2015-04-29 DIAGNOSIS — I10 Essential (primary) hypertension: Secondary | ICD-10-CM | POA: Diagnosis present

## 2015-04-29 DIAGNOSIS — T368X5A Adverse effect of other systemic antibiotics, initial encounter: Secondary | ICD-10-CM | POA: Diagnosis present

## 2015-04-29 DIAGNOSIS — Z7982 Long term (current) use of aspirin: Secondary | ICD-10-CM | POA: Diagnosis not present

## 2015-04-29 DIAGNOSIS — Z87442 Personal history of urinary calculi: Secondary | ICD-10-CM | POA: Diagnosis not present

## 2015-04-29 DIAGNOSIS — M1990 Unspecified osteoarthritis, unspecified site: Secondary | ICD-10-CM | POA: Diagnosis present

## 2015-04-29 DIAGNOSIS — Z9981 Dependence on supplemental oxygen: Secondary | ICD-10-CM | POA: Diagnosis not present

## 2015-04-29 LAB — COMPREHENSIVE METABOLIC PANEL
ALT: 12 U/L — AB (ref 17–63)
AST: 18 U/L (ref 15–41)
Albumin: 3 g/dL — ABNORMAL LOW (ref 3.5–5.0)
Alkaline Phosphatase: 77 U/L (ref 38–126)
Anion gap: 7 (ref 5–15)
BUN: 17 mg/dL (ref 6–20)
CHLORIDE: 104 mmol/L (ref 101–111)
CO2: 28 mmol/L (ref 22–32)
CREATININE: 1.18 mg/dL (ref 0.61–1.24)
Calcium: 8.4 mg/dL — ABNORMAL LOW (ref 8.9–10.3)
GFR calc Af Amer: 57 mL/min — ABNORMAL LOW (ref >60)
GFR calc non Af Amer: 49 mL/min — ABNORMAL LOW (ref >60)
GLUCOSE: 88 mg/dL (ref 65–99)
Potassium: 3.9 mmol/L (ref 3.5–5.1)
SODIUM: 139 mmol/L (ref 135–145)
Total Bilirubin: 0.7 mg/dL (ref 0.3–1.2)
Total Protein: 6.3 g/dL — ABNORMAL LOW (ref 6.5–8.1)

## 2015-04-29 LAB — CBC
HCT: 31.8 % — ABNORMAL LOW (ref 39.0–52.0)
HEMOGLOBIN: 10.6 g/dL — AB (ref 13.0–17.0)
MCH: 29.2 pg (ref 26.0–34.0)
MCHC: 33.3 g/dL (ref 30.0–36.0)
MCV: 87.6 fL (ref 78.0–100.0)
PLATELETS: 209 10*3/uL (ref 150–400)
RBC: 3.63 MIL/uL — AB (ref 4.22–5.81)
RDW: 14.5 % (ref 11.5–15.5)
WBC: 11.6 10*3/uL — AB (ref 4.0–10.5)

## 2015-04-29 LAB — MRSA PCR SCREENING: MRSA by PCR: NEGATIVE

## 2015-04-29 MED ORDER — CEFAZOLIN SODIUM 1-5 GM-% IV SOLN
1.0000 g | Freq: Three times a day (TID) | INTRAVENOUS | Status: DC
  Administered 2015-04-29 – 2015-05-01 (×8): 1 g via INTRAVENOUS
  Filled 2015-04-29 (×16): qty 50

## 2015-04-29 NOTE — Care Management Note (Signed)
Case Management Note  Patient Details  Name: REYLAN VARDEMAN MRN: [id number] Date of Birth: [birth year]  Subjective/Objective:                  Pt admitted with dysphagia. Pt is from [facility] where he resides as a long term resident. Pt plans to return to [facility] [place] is aware of DC plan and will arrange for return to facility when discharged.   Action/Plan: No CM needs.   Expected Discharge Date:     04/30/2015             Expected Discharge Plan:  Skilled Nursing Facility  In-House Referral:  Clinical Social Work  Discharge planning Services  CM Consult  Post Acute Care Choice:  NA Choice offered to:  NA  DME Arranged:    DME Agency:     HH Arranged:    [place] Agency:     Status of Service:  Completed, signed off  Medicare Important Message Given:    Date Medicare IM Given:    Medicare IM give by:    Date Additional Medicare IM Given:    Additional Medicare Important Message give by:     If discussed at [place] of Stay Meetings, dates discussed:    Additional Comments:  Sherald Barge, RN 04/29/2015, 11:23 AM

## 2015-04-29 NOTE — Care Management Obs Status (Signed)
[place] NOTIFICATION   Patient Details  Name: Patrick [name] MRN: [id number] Date of Birth: 12/29/1913   Medicare Observation Status Notification Given:  Yes    Sherald Barge, RN 04/29/2015, 11:23 AM

## 2015-04-29 NOTE — Progress Notes (Signed)
Pharmacy Antibiotic Note  Patrick [name] is a 82 y.o. male admitted on 04/28/2015 with cellulitis.  Pharmacy has been consulted for ANCEF dosing.  Plan: Ancef 1gm IV q8h Transition to PO when improved / appropriate Monitor labs, progress and c/s     Temp (24hrs), Avg:98.1 F (36.7 C), Min:97.9 F (36.6 C), Max:98.3 F (36.8 C)   Recent Labs Lab 04/25/15 0427 04/28/15 1655 04/28/15 2043 04/29/15 0448  WBC 6.8 10.4  --  11.6*  CREATININE 1.09 1.27*  --  1.18  LATICACIDVEN  --  1.5 1.2  --     CrCl cannot be calculated (Unknown ideal weight.).    Allergies  Allergen Reactions  . Celebrex [Celecoxib] Other (See Comments)    unknown  . Codeine Other (See Comments)    unknown   Antimicrobials this admission: Ancef 3/15 >>   Results for orders placed or performed during the hospital encounter of 04/28/15  MRSA PCR Screening     Status: None   Collection Time: 04/28/15 11:47 PM  Result Value Ref Range Status   MRSA by PCR NEGATIVE NEGATIVE Final    Comment:        The GeneXpert MRSA Assay (FDA approved for NASAL specimens only), is one component of a comprehensive MRSA colonization surveillance program. It is not intended to diagnose MRSA infection nor to guide or monitor treatment for MRSA infections.     Thank you for allowing pharmacy to be a part of this patient's care.  Hart Robinsons A 04/29/2015 11:24 AM

## 2015-04-29 NOTE — Evaluation (Signed)
Clinical/Bedside Swallow Evaluation Patient Details  Name: Patrick [name] MRN: [id number] Date of Birth: 09-Jul-1913  Today's Date: 04/29/2015 Time: SLP Start Time (ACUTE ONLY): [id number] SLP Stop Time (ACUTE ONLY): 1530 SLP Time Calculation (min) (ACUTE ONLY): 36 min  Past Medical History:  Past Medical History  Diagnosis Date  . Anxiety   . HTN (hypertension)   . Reflux   . Skin cancer, basal cell   . Diverticulitis   . Hyperlipidemia   . Inguinal hernia   . Ischemic heart disease   . Hiatal hernia   . Kidney stone   . Esophageal stricture   . History of recurrent TIAs   . Restless leg syndrome   . GERD (gastroesophageal reflux disease)   . Pleural effusion   . CHF (congestive heart failure) ([place])   . Urinary retention   . DNR (do not resuscitate)   . DJD (degenerative joint disease), cervical   . Osteoarthritis     bilat knees  . Gout   . Right hip pain   . Bilateral foot pain    Past Surgical History:  Past Surgical History  Procedure Laterality Date  . Intestines    . Exploratory laparotomy w/ bowel resection    . Esophageal dilation    . Fracture surgery    . Hemiarthroplasty hip     HPI:  82 year old male who  has a past medical history of Anxiety; HTN (hypertension); Reflux; Skin cancer, basal cell; Diverticulitis; Hyperlipidemia; Inguinal hernia; Ischemic heart disease; Hiatal hernia; Kidney stone; Esophageal stricture; History of recurrent TIAs; Restless leg syndrome; GERD (gastroesophageal reflux disease); Pleural effusion; CHF (congestive heart failure) ([place]); Urinary retention; DNR (do not resuscitate); DJD (degenerative joint disease), cervical; Osteoarthritis; Gout; Right hip pain; and Bilateral foot pain. Patient was brought to the hospital from skilled facility after he was unable to eat or drink anything. Patient was started on antibiotic Cipro and doxycycline for cellulitis of foot and started having symptoms of nausea. He did not have vomiting or  diarrhea. Denies abdominal pain. No chest or shortness of breath. Patient also was diagnosed with UTI and incidentally urine culture results on 04/12/2015 showed contamination. Again today UA was done which showed negative nitrite, too numerous to count WBC. Patient denies dysuria. SLP asked to evaluate swallow.   Assessment / Plan / Recommendation Clinical Impression  Pt seen at bedside for clinical swallow evaluation. Pt was alert and cooperative. He verbalized that he "couldn't keep anything down" yesterday, but that he thought he was feeling better today. Oral motor examination reveals dry oral cavity, but otherwise unremarkable. Pt wears upper dentures that are ill-fitting at this time due to lack of denture cream (he has some at [facility]). Dentures removed for po trials. Swallow initiation appears timely and pt tolerated ice chips, thin water by cup and straw, applesauce, and self fed a graham cracker. Pt requested additional food, but then complained of discomfort in stomach area. He reported that he felt that he was very full, but needed to burp. Pt then (unfortunately) started to cough/forcefully expel phlegm and what appeared to be water and applesauce. Pt has a history of esophageal stricture (pt's report only) and need for dilation. At this time, pt appears to tolerate po from an oropharyngeal standpoint without signs/symptoms aspiration, however he is obviously unable to keep anything down. Will defer to MD to initiate diet, pt safe for mechanical soft and thin. Consider GI consult if stricture is suspected. SLP will follow in acute setting for diet  tolerance. Above to RN who stated she could give pt Zofran.    Aspiration Risk  Mild aspiration risk (post prandial)    Diet Recommendation Dysphagia 3 (Mech soft);Thin liquid   Liquid Administration via: Cup;Straw Medication Administration: Whole meds with liquid Supervision: Patient able to self feed;Intermittent supervision to cue for compensatory  strategies Compensations: Small sips/bites Postural Changes: Seated upright at 90 degrees;Remain upright for at least 30 minutes after po intake    Other  Recommendations Oral Care Recommendations: Oral care BID Other Recommendations: Clarify dietary restrictions   Follow up Recommendations  None    Frequency and Duration min 2x/week  1 week       Prognosis Prognosis for Safe Diet Advancement: Good Barriers to Reach Goals:  (nausea)      Swallow Study   General Date of Onset: 04/28/15 HPI: 82 year old male who  has a past medical history of Anxiety; HTN (hypertension); Reflux; Skin cancer, basal cell; Diverticulitis; Hyperlipidemia; Inguinal hernia; Ischemic heart disease; Hiatal hernia; Kidney stone; Esophageal stricture; History of recurrent TIAs; Restless leg syndrome; GERD (gastroesophageal reflux disease); Pleural effusion; CHF (congestive heart failure) ([place]); Urinary retention; DNR (do not resuscitate); DJD (degenerative joint disease), cervical; Osteoarthritis; Gout; Right hip pain; and Bilateral foot pain. Patient was brought to the hospital from skilled facility after he was unable to eat or drink anything. Patient was started on antibiotic Cipro and doxycycline for cellulitis of foot and started having symptoms of nausea. He did not have vomiting or diarrhea. Denies abdominal pain. No chest or shortness of breath. Patient also was diagnosed with UTI and incidentally urine culture results on 04/12/2015 showed contamination. Again today UA was done which showed negative nitrite, too numerous to count WBC. Patient denies dysuria. SLP asked to evaluate swallow. Type of Study: Bedside Swallow Evaluation Previous Swallow Assessment: None on record Diet Prior to this Study: NPO Temperature Spikes Noted: No Respiratory Status: Room air History of Recent Intubation: No Behavior/Cognition: Alert;Cooperative;Pleasant mood Oral Cavity Assessment: Within Functional Limits;Dry Oral Care  Completed by SLP: Yes Oral Cavity - Dentition: Dentures, top Vision: Functional for self-feeding Self-Feeding Abilities: Able to feed self Patient Positioning: Upright in bed Baseline Vocal Quality: Normal Volitional Cough: Strong Volitional Swallow: Able to elicit    Oral/Motor/Sensory Function Overall Oral Motor/Sensory Function: Within functional limits   Ice Chips Ice chips: Within functional limits Presentation: Spoon   Thin Liquid Thin Liquid: Within functional limits Presentation: Cup;Self Fed;Straw    Nectar Thick Nectar Thick Liquid: Not tested   Honey Thick Honey Thick Liquid: Not tested   Puree Puree: Within functional limits Presentation: Spoon   Solid   Thank you,  Genene Churn, CCC-SLP [phone]    Solid: Within functional limits Presentation: Self Fed        Melisa Donofrio 04/29/2015,3:34 PM

## 2015-04-30 ENCOUNTER — Telehealth: Payer: Self-pay | Admitting: Nurse Practitioner

## 2015-04-30 DIAGNOSIS — R112 Nausea with vomiting, unspecified: Secondary | ICD-10-CM | POA: Insufficient documentation

## 2015-04-30 DIAGNOSIS — R1310 Dysphagia, unspecified: Secondary | ICD-10-CM

## 2015-04-30 DIAGNOSIS — R110 Nausea: Principal | ICD-10-CM

## 2015-04-30 DIAGNOSIS — L03113 Cellulitis of right upper limb: Secondary | ICD-10-CM

## 2015-04-30 LAB — URINE CULTURE: Culture: >=100000

## 2015-04-30 NOTE — Progress Notes (Signed)
[facility] PROGRESS NOTE  Patrick [name] [id number] DOB: [birth year]    PCP:   Glenda Chroman., MD   HPI:  I saw patient yesterday, but was not able to make an entry.  Patient is a 82 yo male with hx of HTN, anxiety, GERD, esosphageal stricture, HLD, CHF, Gout, admitted for nausea by Dr Darrick Meigs.  He was given Doxy and Cipro, and it was thought that the Doxy may have given him GI problem.  He was also found to have left foot cellulitis, and was started on Ancef IV.  There was concerns about aspiration, and SPT was consulted, and determined it was not the oral pharyngeal phase, but it may have been due to stricture.   Since admission, he has done better, and is alert and orient and conversing meaningfully.   Currently, he is on Dysphagia III, thin.    Rewiew of Systems:  Constitutional: Negative for malaise, fever and chills. No significant weight loss or weight gain Eyes: Negative for eye pain, redness and discharge, diplopia, visual changes, or flashes of light. ENMT: Negative for ear pain, hoarseness, nasal congestion, sinus pressure and sore throat. No headaches; tinnitus, drooling, or problem swallowing. Cardiovascular: Negative for chest pain, palpitations, diaphoresis, dyspnea and peripheral edema. ; No orthopnea, PND Respiratory: Negative for cough, hemoptysis, wheezing and stridor. No pleuritic chestpain. Gastrointestinal: Negative for nausea, vomiting, diarrhea, constipation, abdominal pain, melena, blood in stool, hematemesis, jaundice and rectal bleeding.    Genitourinary: Negative for frequency, dysuria, incontinence,flank pain and hematuria; Musculoskeletal: Negative for back pain and neck pain. Negative for swelling and trauma.;  Skin: . Negative for pruritus, rash, abrasions, bruising and skin lesion.; ulcerations Neuro: Negative for headache, lightheadedness and neck stiffness. Negative for weakness, altered level of consciousness , altered mental status, extremity weakness,  burning feet, involuntary movement, seizure and syncope.  Psych: negative for anxiety, depression, insomnia, tearfulness, panic attacks, hallucinations, paranoia, suicidal or homicidal ideation    Past Medical History  Diagnosis Date  . Anxiety   . HTN (hypertension)   . Reflux   . Skin cancer, basal cell   . Diverticulitis   . Hyperlipidemia   . Inguinal hernia   . Ischemic heart disease   . Hiatal hernia   . Kidney stone   . Esophageal stricture   . History of recurrent TIAs   . Restless leg syndrome   . GERD (gastroesophageal reflux disease)   . Pleural effusion   . CHF (congestive heart failure) ([place])   . Urinary retention   . DNR (do not resuscitate)   . DJD (degenerative joint disease), cervical   . Osteoarthritis     bilat knees  . Gout   . Right hip pain   . Bilateral foot pain     Past Surgical History  Procedure Laterality Date  . Intestines    . Exploratory laparotomy w/ bowel resection    . Esophageal dilation    . Fracture surgery    . Hemiarthroplasty hip      Medications:  HOME MEDS: Prior to Admission medications   Medication Sig Start Date End Date Taking? Authorizing Provider  Amino Acids-Protein Hydrolys (FEEDING SUPPLEMENT, PRO-STAT SUGAR FREE 64,) LIQD Take 30 mLs by mouth 2 (two) times daily.   Yes Historical Provider, MD  aspirin EC 81 MG tablet Take 81 mg by mouth daily.   Yes Historical Provider, MD  bisacodyl (DULCOLAX) 10 MG suppository Place 10 mg rectally as needed for mild constipation or moderate constipation.  Yes Historical Provider, MD  Cholecalciferol (VITAMIN D) 2000 UNITS CAPS Take 1 capsule by mouth daily.   Yes Historical Provider, MD  ciprofloxacin (CIPRO) 250 MG tablet Take 250 mg by mouth 2 (two) times daily.   Yes Historical Provider, MD  docusate sodium (COLACE) 100 MG capsule Take 100 mg by mouth daily. For constipation.   Yes Historical Provider, MD  doxycycline (VIBRAMYCIN) 100 MG capsule Take 100 mg by mouth 2 (two)  times daily.   Yes Historical Provider, MD  furosemide (LASIX) 20 MG tablet Take 20 mg by mouth every other day. For edema.   Yes Historical Provider, MD  gabapentin (NEURONTIN) 100 MG capsule Take 100 mg by mouth at bedtime.   Yes Historical Provider, MD  HYDROcodone-acetaminophen (NORCO/VICODIN) 5-325 MG tablet Take one tablet by mouth four times a day as needed for pain DO NOT EXCEED 3 GM APAP IN 24 HOURS FROM ALL SOURCES 04/15/15  Yes Gildardo Cranker, DO  ipratropium-albuterol (DUONEB) 0.5-2.5 (3) MG/3ML SOLN Take 3 mLs by nebulization every 6 (six) hours as needed (shortness of breath).   Yes Historical Provider, MD  Melatonin 3 MG CAPS Take 3 mg by mouth at bedtime as needed (for sleep).   Yes Historical Provider, MD  omeprazole (PRILOSEC) 20 MG capsule Take 20 mg by mouth daily.   Yes Historical Provider, MD  potassium chloride (K-DUR,KLOR-CON) 10 MEQ tablet Take 10 mEq by mouth every other day.    Yes Historical Provider, MD  promethazine (PHENERGAN) 12.5 MG tablet Take 12.5 mg by mouth every 6 (six) hours as needed for nausea or vomiting.   Yes Historical Provider, MD  rOPINIRole (REQUIP) 1 MG tablet Take 1.5 mg by mouth at bedtime.    Yes Historical Provider, MD  sertraline (ZOLOFT) 50 MG tablet Take 50 mg by mouth at bedtime.    Yes Historical Provider, MD  sodium phosphate (FLEET) enema Place 1 enema rectally once. follow package directions   Yes Historical Provider, MD  tamsulosin (FLOMAX) 0.4 MG CAPS capsule Take 0.4 mg by mouth daily.   Yes Historical Provider, MD  magnesium hydroxide (MILK OF MAGNESIA) 400 MG/5ML suspension Take 30 mLs by mouth daily as needed for mild constipation. If no relief try a Fleets enema, after 2 days.    Historical Provider, MD  OXYGEN Inhale 2 L into the lungs continuous.    Historical Provider, MD     Allergies:  Allergies  Allergen Reactions  . Celebrex [Celecoxib] Other (See Comments)    unknown  . Codeine Other (See Comments)    unknown     Social History:   reports that he has never smoked. He does not have any smokeless tobacco history on file. He reports that he does not drink alcohol or use illicit drugs.  Family History: Family History  Problem Relation Age of Onset  . Arthritis       Physical Exam: Filed Vitals:   04/29/15 1138 04/29/15 1625 04/29/15 2208 04/30/15 0453  BP:  134/88 138/67 155/56  Pulse:  86 72 76  Temp:  98.2 F (36.8 C) 98.2 F (36.8 C) 98 F (36.7 C)  TempSrc:  Oral Oral Axillary  Resp:  18 20 20   Height:  5\' 11"  (1.803 m)    Weight:  69 kg (152 lb 1.9 oz)    SpO2: 94% 95% 96% 97%   Blood pressure 155/56, pulse 76, temperature 98 F (36.7 C), temperature source Axillary, resp. rate 20, height 5\' 11"  (1.803 m), weight  69 kg (152 lb 1.9 oz), SpO2 97 %.  GEN:  Pleasant  patient lying in the stretcher in no acute distress; cooperative with exam. PSYCH:  alert and oriented x4; does not appear anxious or depressed; affect is appropriate. HEENT: Mucous membranes pink and anicteric; PERRLA; EOM intact; no cervical lymphadenopathy nor thyromegaly or carotid bruit; no JVD; There were no stridor. Neck is very supple. Breasts:: Not examined CHEST WALL: No tenderness CHEST: Normal respiration, clear to auscultation bilaterally.  HEART: Regular rate and rhythm.  There are no murmur, rub, or gallops.   BACK: No kyphosis or scoliosis; no CVA tenderness ABDOMEN: soft and non-tender; no masses, no organomegaly, normal abdominal bowel sounds; no pannus; no intertriginous candida. There is no rebound and no distention. Rectal Exam: Not done EXTREMITIES: No bone or joint deformity; age-appropriate arthropathy of the hands and knees; no edema; no ulcerations.  There is no calf tenderness. Genitalia: not examined PULSES: 2+ and symmetric SKIN: Normal hydration no rash or ulceration CNS: Cranial nerves 2-12 grossly intact no focal lateralizing neurologic deficit.  Speech is fluent; uvula elevated with  phonation, facial symmetry and tongue midline. DTR are normal bilaterally, cerebella exam is intact, barbinski is negative and strengths are equaled bilaterally.  No sensory loss.   Labs on Admission:  Basic Metabolic Panel:  Recent Labs Lab 04/25/15 0427 04/28/15 1655 04/29/15 0448  NA 135 137 139  K 4.9 4.8 3.9  CL 101 100* 104  CO2 29 28 28   GLUCOSE 106* 118* 88  BUN 18 19 17   CREATININE 1.09 1.27* 1.18  CALCIUM 8.3* 8.8* 8.4*   Liver Function Tests:  Recent Labs Lab 04/28/15 1655 04/29/15 0448  AST 24 18  ALT 15* 12*  ALKPHOS 94 77  BILITOT 0.7 0.7  PROT 7.2 6.3*  ALBUMIN 3.6 3.0*    Recent Labs Lab 04/28/15 1655  LIPASE 16    Recent Labs Lab 04/25/15 0427 04/28/15 1655 04/29/15 0448  WBC 6.8 10.4 11.6*  NEUTROABS 3.4 8.3*  --   HGB 10.4* 11.7* 10.6*  HCT 32.2* 35.2* 31.8*  MCV 88.7 86.9 87.6  PLT 189 232 209   Cardiac Enzymes:  Recent Labs Lab 04/28/15 1655  TROPONINI <0.03   Radiological Exams on Admission: Ct Head Wo Contrast  04/28/2015  CLINICAL DATA:  Acute onset of difficulty swallowing. Initial encounter. EXAM: CT HEAD WITHOUT CONTRAST TECHNIQUE: Contiguous axial images were obtained from the base of the skull through the vertex without intravenous contrast. COMPARISON:  CT of the head performed 02/23/2013, and MRI of the brain performed 08/23/2013 FINDINGS: There is no evidence of acute infarction, mass lesion, or intra- or extra-axial hemorrhage on CT. Prominence of the ventricles and sulci reflects moderate cortical volume loss. Cerebellar atrophy is noted. Scattered periventricular and subcortical white matter change likely reflects small vessel ischemic microangiopathy. A few chronic lacunar infarcts are seen at the left cerebellar hemisphere. The brainstem and fourth ventricle are within normal limits. The basal ganglia are unremarkable in appearance. The cerebral hemispheres demonstrate grossly normal gray-white differentiation. No mass  effect or midline shift is seen. There is no evidence of fracture; visualized osseous structures are unremarkable in appearance. The orbits are within normal limits. The paranasal sinuses and mastoid air cells are well-aerated. No significant soft tissue abnormalities are seen. IMPRESSION: 1. No acute intracranial pathology seen on CT. 2. Moderate cortical volume loss and scattered small vessel ischemic microangiopathy. 3. Few chronic lacunar infarcts at the left cerebellar hemisphere. Electronically Signed  By: Garald Balding M.D.   On: 04/28/2015 20:57   Dg Abd Acute W/chest  04/28/2015  CLINICAL DATA:  Nausea, vomiting. EXAM: DG ABDOMEN ACUTE W/ 1V CHEST COMPARISON:  April 12, 2015. FINDINGS: There is no evidence of dilated bowel loops or free intraperitoneal air. Phleboliths are noted in the pelvis. Heart size and mediastinal contours are within normal limits. Both lungs are clear. IMPRESSION: No evidence of bowel obstruction or ileus. No acute cardiopulmonary disease. Electronically Signed   By: Marijo Conception, M.D.   On: 04/28/2015 17:50   Assessment/Plan Present on Admission:  . Nausea . UTI (urinary tract infection) . Cellulitis . AKI (acute kidney injury) ([place])  PLAN:  UTI:  Continue with IV Ancef.  Good for cellulitis as well.  Urine culture is pending.  Dysphagia:  He would like GI to see him and dilate his esophagus as before.  Will consult and defer to GI.  Pending opinion from GI, he is stable for discharge.   AKI:  Cr of 1.3 and stable.   Other plans as per orders. Code Status: DNR.    Orvan Falconer, MD.  FACP [facility] Pager [phone] 7pm to 7am.  04/30/2015, 10:34 AM

## 2015-04-30 NOTE — Progress Notes (Addendum)
Speech Language Pathology Treatment: Dysphagia  Patient Details Name: Patrick [name] MRN: [id number] DOB: 05-08-1913 Today's Date: 04/30/2015 Time: [id number] SLP Time Calculation (min) (ACUTE ONLY): 26 min  Assessment / Plan / Recommendation Clinical Impression  Upon SLP arrival, pt laying sideways in bed attempting to self feed full liquid lunch tray. RN helped reposition pt to upright. Pt able to self feed with some loss of bolus from spoon to mouth due to decreased fine motor coordination with upper extremities. Pt with mild wet vocal quality and cued to clear throat and repeat swallow occasionally. No overt coughing. Pt reports improvement in swallow today. I spoke with pt's daughter on the phone and she reports that her father required esophageal dilation in the past and noted improvement following. She feels that he may need this again because he has complained on and off over the past few months about "food not going down".   I looked back in imaging and noted that pt had right infiltrates in the past which is suspicious for aspiration. Recommend GI consult to assess need for EGD; if desired, SLP can complete MBSS and perform esophageal sweep to assist in determining pt's needs given advanced age (ie. Less invasive although may end up needing EGD anyway); or barium swallow could be completed. Pt appears to tolerate full liquids (no regurgitation over my 30 minute visit), but does present with mild wet vocal quality. SLP will follow per direction of hospitalist and GI. Please call if questions.   DG ABDOMEN ACUTE W/ 1V CHEST  COMPARISON: April 12, 2015.  FINDINGS: There is no evidence of dilated bowel loops or free intraperitoneal air. Phleboliths are noted in the pelvis. Heart size and mediastinal contours are within normal limits. Both lungs are clear.   HPI HPI: 82 year old male who  has a past medical history of Anxiety; HTN (hypertension); Reflux; Skin cancer, basal cell;  Diverticulitis; Hyperlipidemia; Inguinal hernia; Ischemic heart disease; Hiatal hernia; Kidney stone; Esophageal stricture; History of recurrent TIAs; Restless leg syndrome; GERD (gastroesophageal reflux disease); Pleural effusion; CHF (congestive heart failure) ([place]); Urinary retention; DNR (do not resuscitate); DJD (degenerative joint disease), cervical; Osteoarthritis; Gout; Right hip pain; and Bilateral foot pain. Patient was brought to the hospital from skilled facility after he was unable to eat or drink anything. Patient was started on antibiotic Cipro and doxycycline for cellulitis of foot and started having symptoms of nausea. He did not have vomiting or diarrhea. Denies abdominal pain. No chest or shortness of breath. Patient also was diagnosed with UTI and incidentally urine culture results on 04/12/2015 showed contamination. Again today UA was done which showed negative nitrite, too numerous to count WBC. Patient denies dysuria. SLP asked to evaluate swallow.      SLP Plan  Continue with current plan of care     Recommendations  Diet recommendations: Dysphagia 3 (mechanical soft);Thin liquid Liquids provided via: Cup;Straw Medication Administration: Whole meds with liquid Supervision: Patient able to self feed;Intermittent supervision to cue for compensatory strategies Compensations: Small sips/bites Postural Changes and/or Swallow Maneuvers: Seated upright 90 degrees;Upright 30-60 min after meal             Oral Care Recommendations: Oral care BID Follow up Recommendations: Skilled Nursing facility Plan: Continue with current plan of care     Thank you,  Genene Churn, [place]                 [place] 04/30/2015, 1:26 PM

## 2015-04-30 NOTE — Telephone Encounter (Signed)
Please schedule patient for BPE in 2 week and notify the [facility]. Anticipate d/c back to [facility] today.

## 2015-04-30 NOTE — Consult Note (Signed)
Referring Provider: No ref. provider found Primary Care Physician:  Glenda Chroman., MD Primary Gastroenterologist:  Dr. Oneida Alar  Date of Admission: 04/28/15 Date of Consultation: 04/30/15  Reason for Consultation:  dysphagia  HPI:  Patrick [name] is a 82 y.o. male with a past medical history of anxiety, hypertension, diverticulitis, hyperlipidemia, recurrent TIAs, GERD, congestive heart failure, and recurrent dysphagia status post dilation presented to the hospital from skilled nursing facility due to difficulties eating and drinking. Patient had been started on Cipro and doxycycline for cellulitis and began having nausea and decreased appetite. Questionable UTI although denies symptoms. Subsequently has been started on Ancef IV. There were concerns about aspiration and a speech language pathology consult performed which determined to be not oral pharyngeal phase and possibly due to stricture. Currently on dysphagia 3 diet, thin liquids. Per patient he would like a GI consult with esophageal dilation "as before." He is nearing discharge potential and awaiting GI opinion. No records and our system of previous endoscopy.  Today he states he is feeling much better today. No more nausea, keeping food down. Ate 50% full liquids with no issue. Last had esophageal dilation [facility]-15 years ago at [facility]. Denies any dysphagia symptoms, only had "getting sick on my stomach when I tried to eat." Symptoms that brought him here/currently are "very different" from symptoms when he had a stricture. Denies abdominal pain, vomiting, hematochezia, melena, GERD symptoms. Denies other upper or lower GI symptoms. Breathing well.Is not wanting to do a procedure right now.  Past Medical History  Diagnosis Date  . Anxiety   . HTN (hypertension)   . Reflux   . Skin cancer, basal cell   . Diverticulitis   . Hyperlipidemia   . Inguinal hernia   . Ischemic heart disease   . Hiatal hernia   . Kidney stone   .  Esophageal stricture   . History of recurrent TIAs   . Restless leg syndrome   . GERD (gastroesophageal reflux disease)   . Pleural effusion   . CHF (congestive heart failure) ([place])   . Urinary retention   . DNR (do not resuscitate)   . DJD (degenerative joint disease), cervical   . Osteoarthritis     bilat knees  . Gout   . Right hip pain   . Bilateral foot pain     Past Surgical History  Procedure Laterality Date  . Intestines    . Exploratory laparotomy w/ bowel resection    . Esophageal dilation    . Fracture surgery    . Hemiarthroplasty hip      Prior to Admission medications   Medication Sig Start Date End Date Taking? Authorizing Provider  Amino Acids-Protein Hydrolys (FEEDING SUPPLEMENT, PRO-STAT SUGAR FREE 64,) LIQD Take 30 mLs by mouth 2 (two) times daily.   Yes Historical Provider, MD  aspirin EC 81 MG tablet Take 81 mg by mouth daily.   Yes Historical Provider, MD  bisacodyl (DULCOLAX) 10 MG suppository Place 10 mg rectally as needed for mild constipation or moderate constipation.   Yes Historical Provider, MD  Cholecalciferol (VITAMIN D) 2000 UNITS CAPS Take 1 capsule by mouth daily.   Yes Historical Provider, MD  ciprofloxacin (CIPRO) 250 MG tablet Take 250 mg by mouth 2 (two) times daily.   Yes Historical Provider, MD  docusate sodium (COLACE) 100 MG capsule Take 100 mg by mouth daily. For constipation.   Yes Historical Provider, MD  doxycycline (VIBRAMYCIN) 100 MG capsule Take 100 mg by mouth  2 (two) times daily.   Yes Historical Provider, MD  furosemide (LASIX) 20 MG tablet Take 20 mg by mouth every other day. For edema.   Yes Historical Provider, MD  gabapentin (NEURONTIN) 100 MG capsule Take 100 mg by mouth at bedtime.   Yes Historical Provider, MD  HYDROcodone-acetaminophen (NORCO/VICODIN) 5-325 MG tablet Take one tablet by mouth four times a day as needed for pain DO NOT EXCEED 3 GM APAP IN 24 HOURS FROM ALL SOURCES 04/15/15  Yes Gildardo Cranker, DO   ipratropium-albuterol (DUONEB) 0.5-2.5 (3) MG/3ML SOLN Take 3 mLs by nebulization every 6 (six) hours as needed (shortness of breath).   Yes Historical Provider, MD  Melatonin 3 MG CAPS Take 3 mg by mouth at bedtime as needed (for sleep).   Yes Historical Provider, MD  omeprazole (PRILOSEC) 20 MG capsule Take 20 mg by mouth daily.   Yes Historical Provider, MD  potassium chloride (K-DUR,KLOR-CON) 10 MEQ tablet Take 10 mEq by mouth every other day.    Yes Historical Provider, MD  promethazine (PHENERGAN) 12.5 MG tablet Take 12.5 mg by mouth every 6 (six) hours as needed for nausea or vomiting.   Yes Historical Provider, MD  rOPINIRole (REQUIP) 1 MG tablet Take 1.5 mg by mouth at bedtime.    Yes Historical Provider, MD  sertraline (ZOLOFT) 50 MG tablet Take 50 mg by mouth at bedtime.    Yes Historical Provider, MD  sodium phosphate (FLEET) enema Place 1 enema rectally once. follow package directions   Yes Historical Provider, MD  tamsulosin (FLOMAX) 0.4 MG CAPS capsule Take 0.4 mg by mouth daily.   Yes Historical Provider, MD  magnesium hydroxide (MILK OF MAGNESIA) 400 MG/5ML suspension Take 30 mLs by mouth daily as needed for mild constipation. If no relief try a Fleets enema, after 2 days.    Historical Provider, MD  OXYGEN Inhale 2 L into the lungs continuous.    Historical Provider, MD    Current Facility-Administered Medications  Medication Dose Route Frequency Provider Last Rate Last Dose  . 0.9 %  sodium chloride infusion   Intravenous Continuous Oswald Hillock, MD 50 mL/hr at 04/29/15 1110    . bisacodyl (DULCOLAX) suppository 10 mg  10 mg Rectal PRN Oswald Hillock, MD      . ceFAZolin (ANCEF) IVPB 1 g/50 mL premix  1 g Intravenous Q8H Orvan Falconer, MD   1 g at 04/30/15 1004  . enoxaparin (LOVENOX) injection 30 mg  30 mg Subcutaneous Q24H Oswald Hillock, MD   30 mg at 04/30/15 0011  . ipratropium-albuterol (DUONEB) 0.5-2.5 (3) MG/3ML nebulizer solution 3 mL  3 mL Nebulization Q6H PRN Oswald Hillock,  MD      . morphine 2 MG/ML injection 1 mg  1 mg Intravenous Q4H PRN Oswald Hillock, MD   1 mg at 04/29/15 1348  . ondansetron (ZOFRAN) tablet 4 mg  4 mg Oral Q6H PRN Oswald Hillock, MD       Or  . ondansetron (ZOFRAN) injection 4 mg  4 mg Intravenous Q6H PRN Oswald Hillock, MD   4 mg at 04/29/15 1556    Allergies as of 04/28/2015 - Review Complete 04/28/2015  Allergen Reaction Noted  . Celebrex [celecoxib] Other (See Comments) 06/03/2010  . Codeine Other (See Comments) 06/03/2010    Family History  Problem Relation Age of Onset  . Arthritis      Social History   Social History  . Marital Status: Married  Spouse Name: N/A  . Number of Children: N/A  . Years of Education: na   Occupational History  . retired    Social History Main Topics  . Smoking status: Never Smoker   . Smokeless tobacco: Not on file  . Alcohol Use: No  . Drug Use: No  . Sexual Activity: Not on file   Other Topics Concern  . Not on file   Social History Narrative    Review of Systems: Gen: Denies fever, chills. CV: Denies chest pain, heart palpitations, syncope, edema  Resp: Denies shortness of breath with rest, cough, wheezing GI: See HPI.  Psych: Denies depression, anxiety,confusion, or memory loss Heme: Denies bruising, bleeding.  Physical Exam: Vital signs in last 24 hours: Temp:  [97.3 F (36.3 C)-98.2 F (36.8 C)] 98 F (36.7 C) (03/16 0453) Pulse Rate:  [65-86] 76 (03/16 0453) Resp:  [18-20] 20 (03/16 0453) BP: (104-155)/(48-88) 155/56 mmHg (03/16 0453) SpO2:  [95 %-97 %] 97 % (03/16 0453) Weight:  [152 lb 1.9 oz (69 kg)] 152 lb 1.9 oz (69 kg) (03/15 1625) Last BM Date: 04/27/15 General:   Alert,  Well-developed, well-nourished, pleasant and cooperative in NAD Head:  Normocephalic and atraumatic. Eyes:  Sclera clear, no icterus. Conjunctiva pink. Lungs:  Clear throughout to auscultation.   No wheezes, crackles, or rhonchi. No acute distress. Heart:  Regular rate and rhythm; no  murmurs, clicks, rubs,  or gallops. Abdomen:  Soft, nontender and nondistended. Normal bowel sounds, without guarding, and without rebound.   Rectal:  Deferred.   Msk:  Difficulty turning head to his left side. Pulses:  Normal bilateral DP pulses noted. Extremities:  Without clubbing or edema. Neurologic:  Alert and  oriented x4;  grossly normal neurologically. Psych:  Alert and cooperative. Normal mood and affect.  Intake/Output from previous day: 03/15 0701 - 03/16 0700 In: 0  Out: 1100 [Urine:1100] Intake/Output this shift:    Lab Results:  Recent Labs  04/28/15 1655 04/29/15 0448  WBC 10.4 11.6*  HGB 11.7* 10.6*  HCT 35.2* 31.8*  PLT 232 209   BMET  Recent Labs  04/28/15 1655 04/29/15 0448  NA 137 139  K 4.8 3.9  CL 100* 104  CO2 28 28  GLUCOSE 118* 88  BUN 19 17  CREATININE 1.27* 1.18  CALCIUM 8.8* 8.4*   LFT  Recent Labs  04/28/15 1655 04/29/15 0448  PROT 7.2 6.3*  ALBUMIN 3.6 3.0*  AST 24 18  ALT 15* 12*  ALKPHOS 94 77  BILITOT 0.7 0.7   PT/INR No results for input(s): LABPROT, INR in the last 72 hours. Hepatitis Panel No results for input(s): HEPBSAG, HCVAB, HEPAIGM, HEPBIGM in the last 72 hours. C-Diff No results for input(s): CDIFFTOX in the last 72 hours.  Studies/Results: Ct Head Wo Contrast  04/28/2015  CLINICAL DATA:  Acute onset of difficulty swallowing. Initial encounter. EXAM: CT HEAD WITHOUT CONTRAST TECHNIQUE: Contiguous axial images were obtained from the base of the skull through the vertex without intravenous contrast. COMPARISON:  CT of the head performed 02/23/2013, and MRI of the brain performed 08/23/2013 FINDINGS: There is no evidence of acute infarction, mass lesion, or intra- or extra-axial hemorrhage on CT. Prominence of the ventricles and sulci reflects moderate cortical volume loss. Cerebellar atrophy is noted. Scattered periventricular and subcortical white matter change likely reflects small vessel ischemic  microangiopathy. A few chronic lacunar infarcts are seen at the left cerebellar hemisphere. The brainstem and fourth ventricle are within normal limits. The basal  ganglia are unremarkable in appearance. The cerebral hemispheres demonstrate grossly normal gray-white differentiation. No mass effect or midline shift is seen. There is no evidence of fracture; visualized osseous structures are unremarkable in appearance. The orbits are within normal limits. The paranasal sinuses and mastoid air cells are well-aerated. No significant soft tissue abnormalities are seen. IMPRESSION: 1. No acute intracranial pathology seen on CT. 2. Moderate cortical volume loss and scattered small vessel ischemic microangiopathy. 3. Few chronic lacunar infarcts at the left cerebellar hemisphere. Electronically Signed   By: Garald Balding M.D.   On: 04/28/2015 20:57   Dg Abd Acute W/chest  04/28/2015  CLINICAL DATA:  Nausea, vomiting. EXAM: DG ABDOMEN ACUTE W/ 1V CHEST COMPARISON:  April 12, 2015. FINDINGS: There is no evidence of dilated bowel loops or free intraperitoneal air. Phleboliths are noted in the pelvis. Heart size and mediastinal contours are within normal limits. Both lungs are clear. IMPRESSION: No evidence of bowel obstruction or ileus. No acute cardiopulmonary disease. Electronically Signed   By: Marijo Conception, M.D.   On: 04/28/2015 17:50    Impression: 82 year old male with a history of GERD, esophageal stricture s/p dilation 10-15 years ago admitted with difficulty eating. Symptoms described as nausea with eating, attributed to likely antibiotics effect on antimicrobials for foot cellulitis. SLP note questions esophageal stricture with no noted oropharyngeal dysphagia. Patient states symptoms currently different from symptoms when he last had a stricture. Symptoms much improved today, no nausea or heaving, tolerating full liquid diet, ate 50% of his lunch with no symptoms. States he wasn't wanting to have a  procedure right now.  Symptoms seem to be more antibiotic-induced nausea, less likely stricture. Discussed options with patient and family at bedside including EGD with dilation versus discharge to home and follow-up as outpatient. The patient seems to prefer outpatient evaluation. At this point recommend continued soft diet as per hospitalist and SLP recommendations, allow patient to return to nursing home and complete antibiotics and resolution of GI upset. Will schedule BPE in 2 weeks and we can see him as an outpatient in 6-8 weeks and discuss his symptoms further. At that time if there's still symptoms attributable to dysphagia can consider EGD +/- dilation for further evaluation and management. Possible component of GERD as well, not currently on PPI  Plan: 1. Continue diet per SLP recommendations 2. BPE in 2 weeks, to be scheduled by our office. 3. Outpatient GI follow-up in 6-8 weeks 4. Follow-up sooner if symptoms worsen 5. Dysphagia diet instructions in the interim 6. Omeprazole 20 mg daily as outpatient until GI follow-up.    Walden Field, AGNP-C Adult & Gerontological Nurse Practitioner [facility]    LOS: 1 day     04/30/2015, 1:25 PM

## 2015-04-30 NOTE — Telephone Encounter (Signed)
Please arrange for 6-8 week hospital follow-up for this patient. Will be d/c to the [facility].

## 2015-05-01 ENCOUNTER — Inpatient Hospital Stay
Admission: RE | Admit: 2015-05-01 | Discharge: 2016-04-13 | Disposition: A | Discharge status: Other Acute Inpatient Hospital | Payer: BLUE CROSS/BLUE SHIELD | Source: Ambulatory Visit | Attending: Internal Medicine | Admitting: Internal Medicine

## 2015-05-01 ENCOUNTER — Inpatient Hospital Stay (HOSPITAL_COMMUNITY): Payer: Medicare Other

## 2015-05-01 DIAGNOSIS — N179 Acute kidney failure, unspecified: Secondary | ICD-10-CM

## 2015-05-01 LAB — URINE CULTURE: Culture: >=100000

## 2015-05-01 MED ORDER — MAGNESIUM HYDROXIDE 400 MG/5ML PO SUSP
30.0000 mL | Freq: Once | ORAL | Status: AC
  Administered 2015-05-01: 30 mL via ORAL
  Filled 2015-05-01: qty 30

## 2015-05-01 NOTE — Discharge Summary (Signed)
Physician Discharge Summary  Patrick [name] [id number] DOB: 1913/07/21 DOA: 04/28/2015  PCP: Glenda Chroman., MD  Admit date: 04/28/2015 Discharge date: 05/01/2015  Time spent: 35 minutes  Recommendations for Outpatient Follow-up:  1. Follow up with GI next week, Dr Oneida Alar. 2. Follow up with PCP next week.    Discharge Diagnoses:  Active Problems:   UTI (urinary tract infection)   Cellulitis   AKI (acute kidney injury) ([place])   Dysphagia   Nausea   Pressure ulcer   Nausea and vomiting in adult patient   Discharge Condition: Improved.   Diet recommendation: soft mechanical diet.   Filed Weights   04/29/15 1625  Weight: 69 kg (152 lb 1.9 oz)    History of present illness: Patient is a 82 yo male admitted with nausea by Dr Eleonore Chiquito on April 28, 2015.  As per his H and P:  " 82 year old male who  has a past medical history of Anxiety; HTN (hypertension); Reflux; Skin cancer, basal cell; Diverticulitis; Hyperlipidemia; Inguinal hernia; Ischemic heart disease; Hiatal hernia; Kidney stone; Esophageal stricture; History of recurrent TIAs; Restless leg syndrome; GERD (gastroesophageal reflux disease); Pleural effusion; CHF (congestive heart failure) ([place]); Urinary retention; DNR (do not resuscitate); DJD (degenerative joint disease), cervical; Osteoarthritis; Gout; Right hip pain; and Bilateral foot pain. Patient was brought to the hospital from skilled facility after he was unable to eat or drink anything. Patient was started on antibiotic Cipro and doxycycline for cellulitis of foot and started having symptoms of nausea. He did not have vomiting or diarrhea. Denies abdominal pain. No chest or shortness of breath. Patient also was diagnosed with UTI and incidentally urine culture results on 04/12/2015 showed contamination. Again today UA was done which showed negative nitrite, too numerous to count WBC. Patient denies dysuria.   Hospital Course:  Patient is a 82 yo male with hx of  HTN, anxiety, GERD, esosphageal stricture, HLD, CHF, Gout, admitted for nausea by Dr Darrick Meigs. He was given Doxy and Cipro, and it was thought that the Doxy may have given him GI problem. He was also found to have left foot cellulitis, and was started on Ancef IV. There was concerns about aspiration, and SPT was consulted, and determined it was not the oral pharyngeal phase, but it may have been due to stricture. Since admission, he has done better, and is alert and orient and conversing meaningfully. Currently, he is on Dysphagia III, thin.  Consultation was requested by GI, and Dr Eden Lathe had seen him and felt that it may have been age related eophageal motility disorder.  Plan would be for him to undergo a pill esophagram and follow up with Dr Eden Lathe in 2 weeks. He was found to have a UTI, and it was tx with Ancef, and he has completed his course.   He will be discharged after the procedure today and have PCP and GI follow up next week.  Thank you and Good Day.    Discharge Exam: Filed Vitals:   04/30/15 2112 05/01/15 0510  BP: 131/53 145/68  Pulse: 62 67  Temp: 98.3 F (36.8 C) 97.6 F (36.4 C)  Resp: 20 20    Discharge Instructions   Discharge Instructions    Diet - low sodium heart healthy    Complete by:  As directed      Increase activity slowly    Complete by:  As directed           Current Discharge Medication List    CONTINUE  these medications which have NOT CHANGED   Details  Amino Acids-Protein Hydrolys (FEEDING SUPPLEMENT, PRO-STAT SUGAR FREE 64,) LIQD Take 30 mLs by mouth 2 (two) times daily.    aspirin EC 81 MG tablet Take 81 mg by mouth daily.    bisacodyl (DULCOLAX) 10 MG suppository Place 10 mg rectally as needed for mild constipation or moderate constipation.    Cholecalciferol (VITAMIN D) 2000 UNITS CAPS Take 1 capsule by mouth daily.    docusate sodium (COLACE) 100 MG capsule Take 100 mg by mouth daily. For constipation.    furosemide (LASIX) 20 MG tablet  Take 20 mg by mouth every other day. For edema.    HYDROcodone-acetaminophen (NORCO/VICODIN) 5-325 MG tablet Take one tablet by mouth four times a day as needed for pain DO NOT EXCEED 3 GM APAP IN 24 HOURS FROM ALL SOURCES Qty: 120 tablet, Refills: 0    ipratropium-albuterol (DUONEB) 0.5-2.5 (3) MG/3ML SOLN Take 3 mLs by nebulization every 6 (six) hours as needed (shortness of breath).    Melatonin 3 MG CAPS Take 3 mg by mouth at bedtime as needed (for sleep).    omeprazole (PRILOSEC) 20 MG capsule Take 20 mg by mouth daily.    potassium chloride (K-DUR,KLOR-CON) 10 MEQ tablet Take 10 mEq by mouth every other day.     promethazine (PHENERGAN) 12.5 MG tablet Take 12.5 mg by mouth every 6 (six) hours as needed for nausea or vomiting.    rOPINIRole (REQUIP) 1 MG tablet Take 1.5 mg by mouth at bedtime.     sertraline (ZOLOFT) 50 MG tablet Take 50 mg by mouth at bedtime.     sodium phosphate (FLEET) enema Place 1 enema rectally once. follow package directions    tamsulosin (FLOMAX) 0.4 MG CAPS capsule Take 0.4 mg by mouth daily.    magnesium hydroxide (MILK OF MAGNESIA) 400 MG/5ML suspension Take 30 mLs by mouth daily as needed for mild constipation. If no relief try a Fleets enema, after 2 days.    OXYGEN Inhale 2 L into the lungs continuous.      STOP taking these medications     ciprofloxacin (CIPRO) 250 MG tablet      doxycycline (VIBRAMYCIN) 100 MG capsule      gabapentin (NEURONTIN) 100 MG capsule        Allergies  Allergen Reactions  . Celebrex [Celecoxib] Other (See Comments)    unknown  . Codeine Other (See Comments)    unknown      The results of significant diagnostics from this hospitalization (including imaging, microbiology, ancillary and laboratory) are listed below for reference.    Significant Diagnostic Studies: Ct Head Wo Contrast  04/28/2015  CLINICAL DATA:  Acute onset of difficulty swallowing. Initial encounter. EXAM: CT HEAD WITHOUT CONTRAST  TECHNIQUE: Contiguous axial images were obtained from the base of the skull through the vertex without intravenous contrast. COMPARISON:  CT of the head performed 02/23/2013, and MRI of the brain performed 08/23/2013 FINDINGS: There is no evidence of acute infarction, mass lesion, or intra- or extra-axial hemorrhage on CT. Prominence of the ventricles and sulci reflects moderate cortical volume loss. Cerebellar atrophy is noted. Scattered periventricular and subcortical white matter change likely reflects small vessel ischemic microangiopathy. A few chronic lacunar infarcts are seen at the left cerebellar hemisphere. The brainstem and fourth ventricle are within normal limits. The basal ganglia are unremarkable in appearance. The cerebral hemispheres demonstrate grossly normal gray-white differentiation. No mass effect or midline shift is seen. There is  no evidence of fracture; visualized osseous structures are unremarkable in appearance. The orbits are within normal limits. The paranasal sinuses and mastoid air cells are well-aerated. No significant soft tissue abnormalities are seen. IMPRESSION: 1. No acute intracranial pathology seen on CT. 2. Moderate cortical volume loss and scattered small vessel ischemic microangiopathy. 3. Few chronic lacunar infarcts at the left cerebellar hemisphere. Electronically Signed   By: Garald Balding M.D.   On: 04/28/2015 20:57   Ct Abdomen Pelvis W Contrast  04/12/2015  CLINICAL DATA:  Abdominal pain and vomiting. EXAM: CT ABDOMEN AND PELVIS WITH CONTRAST TECHNIQUE: Multidetector CT imaging of the abdomen and pelvis was performed using the standard protocol following bolus administration of intravenous contrast. CONTRAST:  124mL OMNIPAQUE IOHEXOL 300 MG/ML  SOLN COMPARISON:  03/08/2013 FINDINGS: Lower chest: No acute findings. Bilateral pleural effusions and bibasilar atelectasis have resolved since previous study. Small hiatal hernia again noted. Hepatobiliary: Left hepatic  lobe cyst remains stable however no liver masses are identified. Tiny calcified gallstones are seen however there is no evidence acute cholecystitis or biliary ductal dilatation. Pancreas: No mass, inflammatory changes, or other significant abnormality. Spleen: Within normal limits in size and appearance. Adrenals/Urinary Tract: No masses identified. No evidence of hydronephrosis. Foley catheter is seen within the bladder which is nearly completely empty. Stomach/Bowel: No evidence of obstruction, inflammatory process, or abnormal fluid collections. Diverticulosis is seen, however there is no evidence of diverticulitis moderate anterior abdominal wall hernia is seen containing small bowel loops, however there is no evidence of small bowel obstruction or ischemia. Vascular/Lymphatic: No pathologically enlarged lymph nodes. Ectasia of abdominal aorta stable but no evidence of abdominal aortic aneurysm. Reproductive: No mass or other significant abnormality identified. Other: Beam hardening artifact through the inferior pelvis from left hip prosthesis. Musculoskeletal:  No suspicious bone lesions identified. IMPRESSION: No acute findings identified within the abdomen or pelvis. Cholelithiasis.  No radiographic evidence of cholecystitis. Stable small hiatal hernia. Colonic diverticulosis. No radiographic evidence of diverticulitis. Stable ventral abdominal wall hernia containing small bowel. No evidence of small bowel obstruction or ischemia. Electronically Signed   By: Earle Gell M.D.   On: 04/12/2015 19:41   Dg Esophagus  05/01/2015  CLINICAL DATA:  Dysphagia, choking episodes on Monday and Tuesday this week, history hypertension, GERD, CHF, ischemic heart disease, advanced Age EXAM: ESOPHOGRAM / BARIUM SWALLOW / BARIUM TABLET STUDY TECHNIQUE: Combined double contrast and single contrast examination performed using effervescent crystals, thick barium liquid, and thin barium liquid. The patient was attempted to  swallow a 13 mm barium sulphate tablet but was unsuccessful. FLUOROSCOPY TIME:  Radiation Exposure Index (as provided by the fluoroscopic device): Not provided If the device does not provide the exposure index: Fluoroscopy Time:  2 minutes 48 seconds Number of Acquired Images: Multiple screen captures during fluoroscopy COMPARISON:  None FINDINGS: Exam performed in LPO and RPO projections with head of bed elevated 30 degrees. Patient unable to stand for exam. Diffuse impairment of esophageal motility with incomplete clearance of barium by primary peristaltic waves. Numerous secondary and tertiary waves identified with prolonged thoracic retention of contrast. Esophagus appears to distend normally. No gross evidence of esophageal mass or stricture identified. Small hiatal hernia present. No definite esophageal wall irregularity or persistent intraluminal filling defect. Patient attempted to swallow a 12.5 mm diameter barium tablet with water but was unable to propel the pill into the hypopharynx and swallow it. Patient experienced an episode of coughing while attempting to swallow the pill with water. Imaging demonstrated  tracheal aspiration of contrast, likely from the residuals which had been noted in the piriform sinuses. IMPRESSION: Laryngeal penetration and aspiration of contrast with identification of a spontaneous cough reflex as above. Diffuse severe esophageal dysmotility question age-related. No gross evidence of esophageal mass or stricture subjectively identified though patient was unable to swallow a 12.5 mm diameter barium tablet to confirm esophageal patency. Small hiatal hernia. Electronically Signed   By: Lavonia Dana M.D.   On: 05/01/2015 14:11   Dg Abd Acute W/chest  04/28/2015  CLINICAL DATA:  Nausea, vomiting. EXAM: DG ABDOMEN ACUTE W/ 1V CHEST COMPARISON:  April 12, 2015. FINDINGS: There is no evidence of dilated bowel loops or free intraperitoneal air. Phleboliths are noted in the pelvis.  Heart size and mediastinal contours are within normal limits. Both lungs are clear. IMPRESSION: No evidence of bowel obstruction or ileus. No acute cardiopulmonary disease. Electronically Signed   By: Marijo Conception, M.D.   On: 04/28/2015 17:50   Dg Abd Acute W/chest  04/12/2015  CLINICAL DATA:  Vomiting.  Prior esophageal strictures. EXAM: DG ABDOMEN ACUTE W/ 1V CHEST COMPARISON:  Multiple exams, including 12/05/2014 and 03/08/2013 FINDINGS: The patient is rotated to the right on today's radiograph, reducing diagnostic sensitivity and specificity. Atherosclerotic aortic arch. Heart size within normal limits. Linear subsegmental atelectasis or scarring along the left hemidiaphragm. Bony demineralization. No free intraperitoneal gas is observed beneath the hemidiaphragms. Paucity of bowel gas with some gas and formed stool in the right colon and rectum but generally gasless elsewhere. Thoracolumbar spondylosis with levoconvex lumbar scoliosis. Left hip hemiarthroplasty. Degenerative arthropathy of the right hip. Lumbar spondylosis and degenerative disc disease. Splenic artery and pelvic vascular calcifications. IMPRESSION: 1. Although the bowel gas pattern is unremarkable, much of the bowel is gasless, and accordingly does not have a pattern to further characterize. This can occasionally discitis bowel pathology. 2. Atherosclerotic aortic arch. 3. Thoracolumbar spondylosis with lumbar scoliosis. 4. Atherosclerosis. 5. Bony demineralization. Electronically Signed   By: Van Clines M.D.   On: 04/12/2015 16:42    Microbiology: Recent Results (from the past 240 hour(s))  Culture, Urine     Status: None   Collection Time: 04/27/15  8:00 PM  Result Value Ref Range Status   Specimen Description URINE, CLEAN CATCH  Final   Special Requests NONE  Final   Culture   Final    >=100,000 COLONIES/mL PROTEUS MIRABILIS Performed at [facility]    Report Status 04/30/2015 FINAL  Final   Organism ID,  Bacteria PROTEUS MIRABILIS  Final      Susceptibility   Proteus mirabilis - MIC*    AMPICILLIN 16 INTERMEDIATE Intermediate     CEFAZOLIN 8 SENSITIVE Sensitive     CEFTRIAXONE <=1 SENSITIVE Sensitive     CIPROFLOXACIN >=4 RESISTANT Resistant     GENTAMICIN >=16 RESISTANT Resistant     IMIPENEM 2 SENSITIVE Sensitive     NITROFURANTOIN 128 RESISTANT Resistant     TRIMETH/SULFA >=320 RESISTANT Resistant     AMPICILLIN/SULBACTAM 8 SENSITIVE Sensitive     PIP/TAZO <=4 SENSITIVE Sensitive     * >=100,000 COLONIES/mL PROTEUS MIRABILIS  Urine culture     Status: None   Collection Time: 04/28/15  6:30 PM  Result Value Ref Range Status   Specimen Description URINE, CATHETERIZED  Final   Special Requests NONE  Final   Culture   Final    >=100,000 COLONIES/mL PROTEUS MIRABILIS Performed at [facility]    Report Status 05/01/2015 FINAL  Final   Organism ID, Bacteria PROTEUS MIRABILIS  Final      Susceptibility   Proteus mirabilis - MIC*    AMPICILLIN 16 INTERMEDIATE Intermediate     CEFAZOLIN 8 SENSITIVE Sensitive     CEFTRIAXONE <=1 SENSITIVE Sensitive     CIPROFLOXACIN >=4 RESISTANT Resistant     GENTAMICIN 8 INTERMEDIATE Intermediate     IMIPENEM 4 SENSITIVE Sensitive     NITROFURANTOIN 128 RESISTANT Resistant     TRIMETH/SULFA >=320 RESISTANT Resistant     AMPICILLIN/SULBACTAM 4 SENSITIVE Sensitive     PIP/TAZO <=4 SENSITIVE Sensitive     * >=100,000 COLONIES/mL PROTEUS MIRABILIS  MRSA PCR Screening     Status: None   Collection Time: 04/28/15 11:47 PM  Result Value Ref Range Status   MRSA by PCR NEGATIVE NEGATIVE Final    Comment:        The GeneXpert MRSA Assay (FDA approved for NASAL specimens only), is one component of a comprehensive MRSA colonization surveillance program. It is not intended to diagnose MRSA infection nor to guide or monitor treatment for MRSA infections.      Labs: Basic Metabolic Panel:  Recent Labs Lab 04/25/15 0427 04/28/15 1655  04/29/15 0448  NA 135 137 139  K 4.9 4.8 3.9  CL 101 100* 104  CO2 29 28 28   GLUCOSE 106* 118* 88  BUN 18 19 17   CREATININE 1.09 1.27* 1.18  CALCIUM 8.3* 8.8* 8.4*   Liver Function Tests:  Recent Labs Lab 04/28/15 1655 04/29/15 0448  AST 24 18  ALT 15* 12*  ALKPHOS 94 77  BILITOT 0.7 0.7  PROT 7.2 6.3*  ALBUMIN 3.6 3.0*    Recent Labs Lab 04/28/15 1655  LIPASE 16   No results for input(s): AMMONIA in the last 168 hours. CBC:  Recent Labs Lab 04/25/15 0427 04/28/15 1655 04/29/15 0448  WBC 6.8 10.4 11.6*  NEUTROABS 3.4 8.3*  --   HGB 10.4* 11.7* 10.6*  HCT 32.2* 35.2* 31.8*  MCV 88.7 86.9 87.6  PLT 189 232 209   Cardiac Enzymes:  Recent Labs Lab 04/28/15 1655  TROPONINI <0.03    Recent Labs  04/12/15 1535  BNP 245.0*   Signed:  Mirakle Tomlin MD.  [facility] 05/01/2015, 2:36 PM

## 2015-05-01 NOTE — Discharge Planning (Signed)
Pt discharged back to [facility], belongings sent with patient, no c/o pain, vitals stable, all paperwork sent with patient, no questions for the nurse

## 2015-05-01 NOTE — Clinical Social Work Note (Signed)
Clinical Social Work Assessment  Patient Details  Name: ABDULSAMAD [name] MRN: [id number] Date of Birth: 1913/08/10  Date of referral:  05/01/15               Reason for consult:   (return to SNF)                Permission sought to share information with:  Case Manager, Customer service manager, Family Supports Permission granted to share information::  Yes, Verbal Permission Granted  Name::        Agency::     Relationship::     Contact Information:     Housing/Transportation Living arrangements for the past 2 months:  [place] of Information:  Patient, Scientist, water quality, Facility Patient Interpreter Needed:  None Criminal Activity/Legal Involvement Pertinent to Current Situation/Hospitalization:  No - Comment as needed Significant Relationships:  Other Family Members, Spouse Lives with:  Facility Resident Do you feel safe going back to the place where you live?  Yes Need for family participation in patient care:  Yes (Comment)  Care giving concerns:  No care giving concerns. Patient is a facility resisdent and agreeable to return: [facility]   Social Worker assessment / plan:  Patient will return to SNF at DC.  No current needs to be addressed.   Patient to be DC today. Hospital staff to transport patient. FL2 updated per request of facility. DC to [facility].  Employment status:  Retired Forensic scientist:  Medicare PT Recommendations:  [place] / Referral to community resources:  Other (Comment Required) (none requested)  Patient/Family's Response to care:  Agreeable to plan  Patient/Family's Understanding of and Emotional Response to Diagnosis, Current Treatment, and Prognosis:  All in agreement to plan. Patient aware of current prognosis as well as family. No concerns.  Emotional Assessment Appearance:  Appears stated age Attitude/Demeanor/Rapport:  Other (calm and cooperative) Affect (typically observed):   Accepting, Adaptable Orientation:  Oriented to Self, Oriented to Situation, Oriented to Place Alcohol / Substance use:  Not Applicable Psych involvement (Current and /or in the community):  No (Comment)  Discharge Needs  Concerns to be addressed:  No discharge needs identified Readmission within the last 30 days:  No Current discharge risk:  None Barriers to Discharge:  No Barriers Identified   [place], LCSW 05/01/2015, 2:10 PM

## 2015-05-01 NOTE — Progress Notes (Signed)
SPEECH PATHOLOGY  Discussed with Dr. Marin Comment this AM. Order received for modified barium swallow study with barium pill esophagram. Radiology to complete barium swallow with administration of pill later today (hopefully around lunch). Please note that SLP discussed with pt and daughter (yesterday) and I shared that I did not suspect stricture due to sudden onset of emesis, however daughter stated that "this has actually been going on a long time". Pt tolerating soft diet and thin liquids without regurgitation. SLP offered to complete MBSS this AM, however unable to schedule with radiology for AM- thus barium swallow this PM. This can be completed as outpatient if discharge is desired (pt at [facility]).  Thank you,  Genene Churn, CCC-SLP [phone]

## 2015-05-01 NOTE — Progress Notes (Signed)
    Subjective: States he's doing well today. Eating soft diet when entering the room with 50-75% consumed. States "I think I'm doing as good as you are today." Denies abdominal pain, /V, dysphagia. Denies coughing up food. States he's ready to go home. No other GI complaints  Objective: Vital signs in last 24 hours: Temp:  [97.6 F (36.4 C)-98.3 F (36.8 C)] 97.6 F (36.4 C) (03/17 0510) Pulse Rate:  [62-80] 67 (03/17 0510) Resp:  [20] 20 (03/17 0510) BP: (124-145)/(53-68) 145/68 mmHg (03/17 0510) SpO2:  [96 %-98 %] 98 % (03/17 0510) Last BM Date: 04/27/15 General:   Alert and oriented, pleasant Head:  Normocephalic and atraumatic. Eyes:  No icterus, sclera clear. Conjuctiva pink.  Heart:  S1, S2 present, no murmurs noted.  Lungs: Clear to auscultation bilaterally, without wheezing, rales, or rhonchi.  Abdomen:  Bowel sounds present, soft, non-tender, non-distended.  Msk:  Symmetrical without gross deformities.  Neurologic:  Alert and  oriented x4;  grossly normal neurologically. Psych:  Alert and cooperative. Normal mood and affect.  Intake/Output from previous day: 03/16 0701 - 03/17 0700 In: 1056.7 [P.O.:360; I.V.:596.7; IV Piggyback:100] Out: 1100 [Urine:1100] Intake/Output this shift:    Lab Results:  Recent Labs  04/28/15 1655 04/29/15 0448  WBC 10.4 11.6*  HGB 11.7* 10.6*  HCT 35.2* 31.8*  PLT 232 209   BMET  Recent Labs  04/28/15 1655 04/29/15 0448  NA 137 139  K 4.8 3.9  CL 100* 104  CO2 28 28  GLUCOSE 118* 88  BUN 19 17  CREATININE 1.27* 1.18  CALCIUM 8.8* 8.4*   LFT  Recent Labs  04/28/15 1655 04/29/15 0448  PROT 7.2 6.3*  ALBUMIN 3.6 3.0*  AST 24 18  ALT 15* 12*  ALKPHOS 94 77  BILITOT 0.7 0.7   PT/INR No results for input(s): LABPROT, INR in the last 72 hours. Hepatitis Panel No results for input(s): HEPBSAG, HCVAB, HEPAIGM, HEPBIGM in the last 72 hours.   Studies/Results: No results found.  Assessment: 82 year old male  with a history of GERD, esophageal stricture s/p dilation 10-15 years ago admitted with difficulty eating. Symptoms described as nausea with eating, attributed to likely antibiotics effect on antimicrobials for foot cellulitis. SLP note questions esophageal stricture with no noted oropharyngeal dysphagia. Patient states symptoms currently different from symptoms when he last had a stricture. Symptoms seem to be more antibiotic-induced nausea, less likely stricture. Possible esophageal motility disorder.  Seems to be doing well clinically today, tolerating soft diet with denials of regurgitation, dysphagia, odynophagia. 50-75% meal consumption this morning. Wants to go home. MBSS and BPE ordered for today for further evaluation. Note already sent to our office to schedule 6-8 week outpatient follow-up.  Plan: 1. BPE ordered for today 2. Outpatient follow-up in 6-8 weeks, request already sent to our office for scheduling 3. Continue supportive measures.   Walden Field, AGNP-C Adult & Gerontological Nurse Practitioner [facility]    LOS: 2 days    05/01/2015, 9:15 AM

## 2015-05-01 NOTE — NC FL2 (Signed)
[place] LEVEL OF CARE SCREENING TOOL     IDENTIFICATION  Patient Name: Patrick [name] Birthdate: [birth year] Sex: male Admission Date (Current Location): 04/28/2015  [facility] and [place] Number:  [employer] and Address:  [place] [address] [place]      Provider Number: [id number]  Attending Physician Name and Address:  Orvan Falconer, MD  Relative Name and Phone Number:       Current Level of Care: Hospital Recommended Level of Care: [place] Prior Approval Number:    Date Approved/Denied:   PASRR Number:    Discharge Plan: SNF    Current Diagnoses: Patient Active Problem List   Diagnosis Date Noted  . Nausea and vomiting in adult patient   . Pressure ulcer 04/29/2015  . Dysphagia 04/28/2015  . Nausea 04/28/2015  . Gout 04/08/2015  . Pain in joint, ankle and foot 12/31/2014  . Leukocytosis 12/14/2014  . Fever, unspecified 12/14/2014  . AKI (acute kidney injury) ([place]) 12/06/2014  . Normocytic anemia 12/06/2014  . Sepsis ([place]) 12/05/2014  . Healthcare-associated pneumonia 12/05/2014  . Numbness 11/04/2014  . Hypotension 10/07/2014  . Edema 07/21/2014  . Cellulitis 07/21/2014  . Rash and nonspecific skin eruption 06/01/2014  . Open toe wound 12/05/2013  . Hypotension, unspecified 09/01/2013  . Unspecified visual disturbance 08/24/2013  . CHF (congestive heart failure) ([place]) 08/22/2013  . Pneumonia 08/22/2013  . UTI (urinary tract infection) 08/22/2013  . Unspecified constipation 08/22/2013  . GERD (gastroesophageal reflux disease) 08/20/2013  . Tachycardia 08/20/2013  . Cough 08/04/2013  . Loss of weight 03/16/2013  . Closed hip fracture requiring operative repair with routine healing 03/10/2013  . BPH (benign prostatic hyperplasia) 03/10/2013  . Restless legs 03/10/2013  . Depression 03/10/2013  . Insomnia 03/07/2013  . Arthritis of knee, degenerative 12/22/2010  . Knee pain  12/22/2010    Orientation RESPIRATION BLADDER Height & Weight     Self, Situation, Place  Normal Incontinent, Indwelling catheter Weight: 152 lb 1.9 oz (69 kg) Height:  5\' 11"  (180.3 cm)  BEHAVIORAL SYMPTOMS/MOOD NEUROLOGICAL BOWEL NUTRITION STATUS  Other (Comment) (none)   Continent Diet (Dysphagia 3 (mechanical soft);Thin liquid)  AMBULATORY STATUS COMMUNICATION OF NEEDS Skin   Extensive Assist Verbally PU Stage and Appropriate Care (Unstageable - Full thickness tissue loss in which the base of the ulcer is covered by slough (yellow, tan, gray, green or brown) and/or eschar (tan, brown or black) in the wound bed.)                       Personal Care Assistance Level of Assistance  Bathing, Feeding, Dressing Bathing Assistance: Limited assistance Feeding assistance: Limited assistance Dressing Assistance: Limited assistance     Functional Limitations Info  Sight, Hearing, Speech Sight Info: Impaired Hearing Info: Impaired Speech Info: Adequate    SPECIAL CARE FACTORS FREQUENCY  PT (By licensed PT), OT (By licensed OT), Speech therapy     PT Frequency: 3 OT Frequency: 3     Speech Therapy Frequency: 3      Contractures Contractures Info: Not present    Additional Factors Info  Code Status, Allergies, Psychotropic, Insulin Sliding Scale, Isolation Precautions Code Status Info: DNR Allergies Info: Celebrex, Codeine Psychotropic Info: none Insulin Sliding Scale Info: none Isolation Precautions Info: none     Current Medications (05/01/2015):  This is the current hospital active medication list Current Facility-Administered Medications  Medication Dose Route Frequency Provider Last  Rate Last Dose  . 0.9 %  sodium chloride infusion   Intravenous Continuous Oswald Hillock, MD 50 mL/hr at 04/29/15 1110    . bisacodyl (DULCOLAX) suppository 10 mg  10 mg Rectal PRN Oswald Hillock, MD      . ceFAZolin (ANCEF) IVPB 1 g/50 mL premix  1 g Intravenous Q8H Orvan Falconer, MD   1 g at  05/01/15 1031  . enoxaparin (LOVENOX) injection 30 mg  30 mg Subcutaneous Q24H Oswald Hillock, MD   30 mg at 04/30/15 2210  . ipratropium-albuterol (DUONEB) 0.5-2.5 (3) MG/3ML nebulizer solution 3 mL  3 mL Nebulization Q6H PRN Oswald Hillock, MD      . morphine 2 MG/ML injection 1 mg  1 mg Intravenous Q4H PRN Oswald Hillock, MD   1 mg at 04/29/15 1348  . ondansetron (ZOFRAN) tablet 4 mg  4 mg Oral Q6H PRN Oswald Hillock, MD       Or  . ondansetron (ZOFRAN) injection 4 mg  4 mg Intravenous Q6H PRN Oswald Hillock, MD   4 mg at 04/29/15 1556     Discharge Medications: Please see discharge summary for a list of discharge medications.  Relevant Imaging Results:  Relevant Lab Results:   Additional Information    Lilly [place], LCSW

## 2015-05-01 NOTE — Care Management Note (Signed)
Case Management Note  Patient Details  Name: Patrick [name] MRN: [id number] Date of Birth: [birth year]  Expected Discharge Date:      05/01/2015            Expected Discharge Plan:  [name]  In-House Referral:  Clinical Social Work  Discharge planning Services  CM Consult  Post Acute Care Choice:  NA Choice offered to:  NA  DME Arranged:    DME Agency:     HH Arranged:    [place] Agency:     Status of Service:  Completed, signed off  Medicare Important Message Given:  Yes Date Medicare IM Given:    Medicare IM give by:    Date Additional Medicare IM Given:    Additional Medicare Important Message give by:     If discussed at [place] of Stay Meetings, dates discussed:    Additional Comments: Anticipate return to SNF today. CSW is aware and will arrange for return to facility. No CM needs.  Sherald Barge, RN 05/01/2015, 12:57 PM

## 2015-05-01 NOTE — Care Management Important Message (Signed)
Important Message  Patient Details  Name: URIAH KLABUNDE MRN: [id number] Date of Birth: 1914-01-20   Medicare Important Message Given:  Yes    Sherald Barge, RN 05/01/2015, 12:56 PM

## 2015-05-04 ENCOUNTER — Other Ambulatory Visit: Payer: Self-pay

## 2015-05-04 DIAGNOSIS — R1310 Dysphagia, unspecified: Secondary | ICD-10-CM

## 2015-05-04 NOTE — Telephone Encounter (Signed)
Review with Walden Field, NP and he said to cancel

## 2015-05-04 NOTE — Telephone Encounter (Signed)
Upon review of chart pt had test done on 03/17

## 2015-05-04 NOTE — Telephone Encounter (Signed)
APPT MADE AND [facility] AWARE OF DATE AND TIME

## 2015-05-04 NOTE — Telephone Encounter (Signed)
Orders entered

## 2015-05-07 ENCOUNTER — Non-Acute Institutional Stay (SKILLED_NURSING_FACILITY): Payer: Medicare Other | Admitting: Internal Medicine

## 2015-05-07 ENCOUNTER — Encounter: Payer: Self-pay | Admitting: Internal Medicine

## 2015-05-07 DIAGNOSIS — L03116 Cellulitis of left lower limb: Secondary | ICD-10-CM

## 2015-05-07 DIAGNOSIS — N390 Urinary tract infection, site not specified: Secondary | ICD-10-CM

## 2015-05-07 DIAGNOSIS — R110 Nausea: Secondary | ICD-10-CM | POA: Diagnosis not present

## 2015-05-07 NOTE — Assessment & Plan Note (Signed)
Monitor for signs of recurrent urinary tract infection. Rx will not be started at this time as is he is afebrile.

## 2015-05-07 NOTE — Patient Instructions (Signed)
Please ask your daughter if she can buy you. amplification equipment with ear buds rather than the present device which compresses the ear and can aggravate the healing ear wound.

## 2015-05-07 NOTE — Assessment & Plan Note (Signed)
According to his nurse this is improving steadily although he is not on antibiotics at this time

## 2015-05-07 NOTE — Progress Notes (Signed)
Subjective:    Patient ID: Patrick [name], male    DOB: 11/24/1913, 82 y.o.   MRN: [id number]  HPI This is a  [place] readmission within 30 days with update of medical problem list and diagnoses with formulation of appropriate care plan as noted. Interim medical record and care plan; diagnostic studies; and change in clinical status since last visit are documented as follows.  HPI: He was hospitalized 3/14-3/17/17 with nausea in the context of oral Cipro and doxycycline for cellulitis of the left foot. Urine culture collected 3/14 did reveal greater than 100 thousand Proteus mirabilis and this was sensitive to IV cefazolin which was prescribed for the cellulitis. The culture did reveal that the Proteus was resistant to Cipro. Additionally there was question of difficulty swallowing. Head CT revealed no acute pathology. He does have a past medical history for  laparotomy and partial bowel resection. He also has a history of esophageal dilation. Labs revealed mild anemia which was improved at the time of discharge. Hematocrit was 35.2. Creatinine did rise from 1.09 up to 1.27.  Review of Systems he states "I feel fine". He states he continues to have restless leg symptoms and pain in his right shoulder. He describes tenderness at the site of a wound of the left posterior ear. According to his nurse this is actually improved. He denies any active GI or GU symptoms  There is no significant cough, sputum production,hemoptysis, wheezing,or  paroxysmal nocturnal dyspnea. Unexplained weight loss, abdominal pain, significant dyspepsia, dysphagia, melena, rectal bleeding, or persistently small caliber stools are not present. Dysuria, pyuria, hematuria, frequency, nocturia or polyuria are denied.     Objective:   Physical Exam  Pertinent or positive findings include: Appears younger than his stated age. He is markedly hard of hearing. He is wearing an audio amplifier which presses the ear  against the scalp. Pattern alopecia is present. As an upper dental plate. He is not wearing the lower plate. Heart rate is slow and heard best in the epigastrium. Breath sounds are decreased. He has mixed DIP/PIP arthritic change of the hands. He has 1/2+ pitting edema. Pulses are slightly decreased. There is an irregular 1.5 x 1.5 macerated eschar over the left posterior ear. Likely is no associated cellulitis or pustule formation. He has scattered senile keratoses. This very faint erythema over the dorsum of the right foot without significant cellulitis or pustule formation. There is irregular hyperpigmentation over the inferior surface of the second and third toes. Toenails are deformed thickened, and elongated. There is a Foley catheter is in place.  General appearance :adequately nourished; in no distress.  Eyes: No conjunctival inflammation or scleral icterus is present.  Oral exam:  Lips and gums are healthy appearing.There is no oropharyngeal erythema or exudate noted.  Heart:  regular rhythm. S1 and S2 normal without gallop, murmur, click, rub or other extra sounds    Lungs:Chest clear to auscultation; no wheezes, rhonchi,rales ,or rubs present.No increased work of breathing.   Abdomen: bowel sounds normal, soft and non-tender without masses, organomegaly or hernias noted.  No guarding or rebound.   Vascular : all pulses equal ; no bruits present.  Skin:Warm & dry.  Intact without suspicious  rashes ; no tenting or jaundice   Lymphatic: No lymphadenopathy is noted about the head, neck, axilla.   Neuro: Strength, tone generally reduced     Assessment & Plan:  See summary under each active problem in the Problem List with associated updated therapeutic  plan

## 2015-05-09 ENCOUNTER — Encounter: Payer: Self-pay | Admitting: Internal Medicine

## 2015-05-09 NOTE — Progress Notes (Signed)
Patient ID: Patrick [name], male   DOB: 08-31-1913, 82 y.o.   MRN: [id number]        This is an acute visit  Level care skilled.  Facility [facility].  Chief complaint -- Acute visit secondary to dysphagia-nausea-left foot cellulitis   History of present illness.  Patient is a very pleasant 82 year old male who has been quite stable-he did have a  Hospitalization  Last year for significantly elevated white count of over 50,000-this was thought to be possibly secondary to pneumonia this was treated and white count actually normalized fairly quickly.   in the past has been treated for osteomyelitis of the feet as well. He was seen recently by Dr. Dellia Nims for some erythema left foot this responded to a course of Cipro and doxycycline-nursing staff up couple weeks ago noted some recurrent erythema here again--this appears to have responded to a course of Cipro and doxycycline Recently there was some recurrent he was started again on Cipro and doxycycline-however today patient is complaining of nausea and he also cannot really swallow-says he is spitting up clear drainage phlegm.  His vital signs are stable he does not appear to be in distress but he is concerned  feels he just is having trouble swallowing and feels he can't really eat             Previous medical history.  History of left femur fracture status post repair.  BPH.  Restless legs.  Depression.  History of cervical DJD.  Osteoarthritis.  Osteomyelitis lower extremity.  History of basal cell carcinoma.  Diverticulosis.  Hyperlipidemia.  Right inguinal hernia.  Ischemic heart disease.  Hiatal hernia.  History of kidney stones.  History is esophageal stricture and dilation in the past.  Recurrent TIAs.  Recurrent diverticulitis and perforation and exploratory laparotomy and sigmoid resection in June 2011.  GERD.  Hypertension.  Social history-patient previously lived in an assisted  living facility today he has a very supportive son who lives in [place] distant history of tobacco use almost 50 years ago no history of alcohol abuse.  Family history is not pertinent.  Medications  Aspirin enteric-coated 81 mg daily.  Ducolax rectally and milligrams daily when necessary.  Vitamin D 2000 units daily.  Colace 100 mg daily.  Ensure daily.  Lasix 20 mg every other day.  Vicodin 5-3 25 mg-4 times a day when necessary pain.  DuoNeb nebulizers every 6 hours when necessary.  Milk of magnesia when necessary.  Melatonin 3 mg daily at bedtime when necessary.  Prilosec 20 mg daily.  Oxygen as needed via nasal cannula.  Potassium chloride 10 mEq every other day.  Requip 1.5 mg daily at bedtime.  Zoloft 50 mg daily.  Flomax 0.4 mg daily   Review of systems.  In general does not complain of any fever or chills   Skin does not complain of any rashes or itching does have increased erythema distal portion of his  leftt foot.  Eyes ears nose mouth and throat does not complain of visual changes or sore throat.  Respiratory does not complain of shortness of breath    Cardiac does not complain of any chest pain palpitations--   GI is not complaining of any abdominal pain nausea vomiting diarrhea or constipation feels he is having trouble swallowing and this is been fairly recent onset and is quite concerning to him  GU is not complaining of  overt dysuria does have a chronic indwelling Foley catheter   Muscle skeletal--at  times will complain of foot discomfort but this does not appear grossly changed  A Neurologic is not complaining of dizziness headache syncopal-type feelings.--   Psych-has a history depression on Zoloft-is complaining of some anxiety secondary to his swallowing difficulty       Physical exam.  He is afebrile pulse 88 respirations 16 blood pressure 148/68   In general this is a pleasant elderly male in no acutre  distress --but appear somewhat uncomfortable0  His skin is warm and dry he does have known numerous solar induced changes and skin cancer removal sites these appear to be stable  Oropharynx is clear mucous membranes appear fairly moist.  Eyes pupils appear reactive to light sclera and conjunctiva are clear visual acuity appears grossly intact.  Chest shallow air entry but I could not really appreciate overt congestion there is no labored breathing . Heart  regular irregular rate and rhythm in the 80s he has mild lower extremity edema fairly minimal    This is baseline.  Abdomen is protuberant nontender he has a well-healed surgical scar and a right sided hernia which appears to be unchanged-does have positive bowel sounds.  GU he has a chronic indwelling catheter draining amber colored urine  Musculoskeletal is able to move all extremities 4 has some lower extremity weakness which is not new strength appears preserved he ambulates in a wheelchair without difficulty. He does have erythema the distal aspect on the top of his leftfoot extending to the second third and fourth toes this appears to be diminished from previous exam ---  I do not note any deformity of the foot  Neurologic is grossly intact to speech is clear no lateralizing findings--touch sensation appears to be intact feet bilaterally.  Psych he is largely alert and oriented-pleasant and conversant although he is somewhat anxious today   Labs.  04/12/2015.  Sodium 141 potassium 4.2 BUN 20 creatinine 1.18.  ALT 13 otherwise liver function tests within normal limits.  WBC 14.1 hemoglobin 11.9 platelets 310  01/05/2015.  Vitamin D level XL.8.  01/01/2059.  Sodium 136 potassium 4.6 BUN 12 creatinine 1.08.  Albumin 3.3 ALT 11 otherwise liver function tests within normal limits.  WBC 5.5 hemoglobin 11.0 platelets 177  12/15/2014.  WBC 6.9 hemoglobin 10.2 platelets 221.  Sodium 134 potassium 3.9 BUN 11  creatinine 0.86.  Albumin 3.0 ALT 12 otherwise liver function tests within normal limits  12/08/2014.  Sodium 134 potassium 4.5 BUN 12 creatinine 1.21.  12/06/2014.  AST 23-ALT 11-S1 phosphatase 79-bilirubin 0.7-albumin 2.7.  12/08/2014.  WBC 10.5-hemoglobin 10.7-platelets 229.   Assessment and plan  Dysphagia-patient is complaining of swallowing difficulty  cannot really eat or drink comfortably which is acutely concerning-I do note he is on Cipro and doxycycline some question whether this may be contributing to it-however since he feels he cannot really Eat drink no sent to the ER for expedient evaluation-in regards to cellulitis this appears to be somewhat improved again will await ER evaluation possible hospital evaluation of this as well.  [id number] note greater than 35 minutes spent assessing patient-discussing his status with nursing staff-reassessing patient to assure stability beforer sending to the ER       [phone]

## 2015-05-14 ENCOUNTER — Other Ambulatory Visit (HOSPITAL_COMMUNITY): Payer: Self-pay

## 2015-06-09 ENCOUNTER — Encounter: Payer: Self-pay | Admitting: Internal Medicine

## 2015-06-09 ENCOUNTER — Non-Acute Institutional Stay (SKILLED_NURSING_FACILITY): Payer: Medicare Other | Admitting: Internal Medicine

## 2015-06-09 DIAGNOSIS — N400 Enlarged prostate without lower urinary tract symptoms: Secondary | ICD-10-CM

## 2015-06-09 DIAGNOSIS — I509 Heart failure, unspecified: Secondary | ICD-10-CM

## 2015-06-09 DIAGNOSIS — M1712 Unilateral primary osteoarthritis, left knee: Secondary | ICD-10-CM

## 2015-06-09 DIAGNOSIS — K219 Gastro-esophageal reflux disease without esophagitis: Secondary | ICD-10-CM | POA: Diagnosis not present

## 2015-06-09 NOTE — Progress Notes (Deleted)
Patient ID: Patrick [name], male   DOB: 09-26-1913, 82 y.o.   MRN: [id number]

## 2015-06-09 NOTE — Progress Notes (Signed)
Patient ID: Patrick [name], male   DOB: [birth year], 82 y.o.   MRN: [id number]  Location:  [facility]   Place of Service:  SNF (31)   Glenda Chroman, MD  Patient Care Team: Glenda Chroman, MD as PCP - General (Internal Medicine)  Extended Emergency Contact Information Primary Emergency Contact: Black,Shirley Address: [address]          [place], [place] [address] [name] of [place] Phone: [phone] Mobile Phone: [phone] Relation: Daughter   Goals of care: Advanced Directive information Advanced Directives 06/09/2015  Does patient have an advance directive? Yes  Type of Advance Directive Out of facility DNR (pink MOST or yellow form)  Does patient want to make changes to advanced directive? No - Patient declined  Copy of advanced directive(s) in chart? Yes     Chief Complaint  Patient presents with  . Acute Visit  Secondary to continued left hip and knee pain-also would like diet modified.  Medical management of chronic issues CHF BPH depression restless legs.  Also history of left foot cellulitis  HPI:  Pt is a 82 y.o. male seen today for an acute visit for complaints of continued left hip and knee pain-also for medical management of above-stated issues.  Patient has complained of some persistent left hip and knee pain we have x-rayed this previously and basically showed degenerative changes see is status post left hip replacement bioprosthesis most recent x-ray did not show any thing of great concern.  He also is requesting that his diabetes change somewhat he says trying eat barbecue regular is somewhat difficult with his dentures and would like a chopped-I did discuss this with the speech therapist as well as nursing and will order a dysphagia 2 diet.  Most recently patient is experienced left foot cellulitis as well as a UTI-she has she went to the hospital complaining he could not swallow-he had been on Cipro on doxycycline apparently did not  tolerate this well he was put on on IV cephalosporin which was effective for the cellulitis as well as a UTI positive for Proteus.  Left foot has showed significant improvement in stability here recently which is encouraging.  In regards to swallowing issues workup in the hospital-show any acute situation CT of the head was negative for any acute process studies did show fairly severe esophageal dysmotility question whether this with age related.  Other issues appear to be relatively stable.  He has complained of restless legs but this appears to have stabilized somewhat on Requip at bedtime.  Has a history of CHF is on Lasix every other day and this appears to be stable and actually appears somewhat improved today.  He does have a history of some renal insufficiency but this appears stabilized as well with a lab on March 15 showing a creatinine of 1.18 BUN of 17.  Again other than the left hip and leg pain as well as complaints about food consistency appears to be doing well.  He has had a history of hypotension in the past but this appears stabilized recently pressures 116/73-104/61-also some history of tachycardia which has been stable for an extended period of time.     Past Medical History  Diagnosis Date  . Anxiety   . HTN (hypertension)   . Reflux   . Skin cancer, basal cell   . Diverticulitis   . Hyperlipidemia   . Inguinal hernia   . Ischemic heart disease   . Hiatal hernia   .  Kidney stone   . Esophageal stricture   . History of recurrent TIAs   . Restless leg syndrome   . GERD (gastroesophageal reflux disease)   . Pleural effusion   . CHF (congestive heart failure) ([place])   . Urinary retention   . DNR (do not resuscitate)   . DJD (degenerative joint disease), cervical   . Osteoarthritis     bilat knees  . Gout   . Right hip pain   . Bilateral foot pain    Past Surgical History  Procedure Laterality Date  . Intestines    . Exploratory laparotomy w/ bowel  resection    . Esophageal dilation    . Fracture surgery    . Hemiarthroplasty hip      Allergies  Allergen Reactions  . Celebrex [Celecoxib] Other (See Comments)    unknown  . Codeine Other (See Comments)    unknown    Current Outpatient Prescriptions on File Prior to Visit  Medication Sig Dispense Refill  . Amino Acids-Protein Hydrolys (FEEDING SUPPLEMENT, PRO-STAT SUGAR FREE 64,) LIQD Take 30 mLs by mouth 2 (two) times daily.    Marland Kitchen aspirin EC 81 MG tablet Take 81 mg by mouth daily.    . bisacodyl (DULCOLAX) 10 MG suppository Place 10 mg rectally as needed for mild constipation or moderate constipation.    . Cholecalciferol (VITAMIN D) 2000 UNITS CAPS Take 1 capsule by mouth daily.    Marland Kitchen docusate sodium (COLACE) 100 MG capsule Take 100 mg by mouth daily. For constipation.    . furosemide (LASIX) 20 MG tablet Take 20 mg by mouth every other day. For edema.    Marland Kitchen HYDROcodone-acetaminophen (NORCO/VICODIN) 5-325 MG tablet Take one tablet by mouth four times a day as needed for pain DO NOT EXCEED 3 GM APAP IN 24 HOURS FROM ALL SOURCES 120 tablet 0  . ipratropium-albuterol (DUONEB) 0.5-2.5 (3) MG/3ML SOLN Take 3 mLs by nebulization every 6 (six) hours as needed (shortness of breath).    . magnesium hydroxide (MILK OF MAGNESIA) 400 MG/5ML suspension Take 30 mLs by mouth daily as needed for mild constipation. If no relief try a Fleets enema, after 2 days.    . Melatonin 3 MG CAPS Take 3 mg by mouth at bedtime as needed (for sleep).    Marland Kitchen omeprazole (PRILOSEC) 20 MG capsule Take 20 mg by mouth daily.    . OXYGEN Inhale 2 L into the lungs continuous.    . potassium chloride (K-DUR,KLOR-CON) 10 MEQ tablet Take 10 mEq by mouth every other day.     . promethazine (PHENERGAN) 12.5 MG tablet Take 12.5 mg by mouth every 6 (six) hours as needed for nausea or vomiting.    Marland Kitchen rOPINIRole (REQUIP) 1 MG tablet Take 1.5 mg by mouth at bedtime.     . sertraline (ZOLOFT) 50 MG tablet Take 50 mg by mouth at  bedtime.     . sodium phosphate (FLEET) enema Place 1 enema rectally once. follow package directions    . tamsulosin (FLOMAX) 0.4 MG CAPS capsule Take 0.4 mg by mouth daily.     No current facility-administered medications on file prior to visit.     Review of Systems  Immunization History  Administered Date(s) Administered  . Influenza,inj,Quad PF,36+ Mos 12/07/2014  . Influenza-Unspecified 11/20/2013  . Pneumococcal-Unspecified 02/14/2009   Pertinent  Health Maintenance Due  Topic Date Due  . PNA vac Low Risk Adult (2 of 2 - PCV13) 02/14/2010  . INFLUENZA VACCINE  09/15/2015    Review of systems please see below    Filed Vitals:   06/09/15 0939  BP: 116/73  Pulse: 84  Temp: 96.2 F (35.7 C)  TempSrc: Oral  Resp: 20  Height: 5\' 11"  (1.803 m)  Weight: 152 lb 1.9 oz (69.001 kg)   Body mass index is 21.23 kg/(m^2). Physical Exam   Review of systems  In general no complaints of fever or chills.  Skin does not complain of rashes or itching currently he does have some solar induced changes has had some history of basal cell carcinoma in the past it appears.  Ears eyes nose mouth and throat does not complain of any visual changes or sore throat.  Respiratory does not complain of shortness breath or cough  Cardiac does not complaining of any chest pain edema appears to be somewhat improved actually.  GU is not playing of any abdominal nausea vomiting diarrhea or constipation or abdominal pain.  Again he has complained somewhat of a difficult chewing certain consistencies.  GU is not complaining of dysuria currently has a chronic indwelling catheter with history of BPH.  Muscle skeletal is complaining of some chronic left hip and leg pain-no history of recent trauma.  Neurologic is not complaining of dizziness headache or numbness in.  Psych has a history of depression although this appears to be pretty stable on Zoloft.  Physical exam.  Temperature 96.2  pulse 84 respirations 20 blood pressure 116/73.  In general is a pleasantly male in no distress sitting comfortably in his wheelchair.  His skin is warm and dry he has numerous solar induced changes skin cancer removal sites that appear to be stable.  Oropharynx clear mucous membranes moist.  Eyes pupils appear reactive light sclera and conjunctiva are clear visual acuity appears grossly intact.  Chest is clear with shallow air entry could not really appreciate any overt congestion no labored breathing.  Artery is regular rate and rhythm without murmur gallop or rub he has trace edema lower extremities bilaterally.  Abdomen is protuberant nontender well-healed surgical scar in right-sided hernia which appears unchanged from previous exams he has positive bowel sounds.  GU has a chronic indwelling catheter draining amber colored urine.  Muscle skeletal is able to move all extremities 4 with limited range of motion of his lower extremities bilaterally at the knee.  Some mild tenderness to palpation of his left posterior hip area as well as left knee area I do not note any acute changes there is no erythema or significant warmth there are significant arthritic changes.  Left foot looks significantly improved from when I last saw there is really no significant erythema here.  Neurologic is grossly intact to speech is clear no lateralizing findings.  Psych he continues to be largely alert and oriented pleasant cooperative which is his baseline.    Labs reviewed:  Recent Labs  11/03/14 0700  04/25/15 0427 04/28/15 1655 04/29/15 0448  NA  --   < > 135 137 139  K  --   < > 4.9 4.8 3.9  CL  --   < > 101 100* 104  CO2  --   < > 29 28 28   GLUCOSE  --   < > 106* 118* 88  BUN  --   < > 18 19 17   CREATININE  --   < > 1.09 1.27* 1.18  CALCIUM  --   < > 8.3* 8.8* 8.4*  MG 2.0  --   --   --   --   < > =  values in this interval not displayed.  Recent Labs  04/12/15 1535  04/28/15 1655 04/29/15 0448  AST 20 24 18   ALT 13* 15* 12*  ALKPHOS 105 94 77  BILITOT 0.7 0.7 0.7  PROT 8.0 7.2 6.3*  ALBUMIN 3.8 3.6 3.0*    Recent Labs  04/12/15 1535 04/25/15 0427 04/28/15 1655 04/29/15 0448  WBC 14.1* 6.8 10.4 11.6*  NEUTROABS 11.6* 3.4 8.3*  --   HGB 11.9* 10.4* 11.7* 10.6*  HCT 36.3* 32.2* 35.2* 31.8*  MCV 86.8 88.7 86.9 87.6  PLT 310 189 232 209   No results found for: TSH Lab Results  Component Value Date   HGBA1C 6.1* 11/12/2014     : Assessment and plan.  #1-history of left hip and knee pain-this is chronic he is receiving Norco as needed for pain control but still continues of discomfort-he would like a steroid injection-will arrange an orthopedic consult for this possibility- Again x-rays have shown arthritic changes.  #2 history of complaining not being able to swallow-apparently this has improved he is on a proton pump inhibitor-he has complained about the consistency of some of his food I did speak with the speech therapist about this as well as nursing in he will be started on a dysphagia 2 diet and this will be monitored.  #3 history of left foot cellulitis-this appears largely resolved status post IV antibiotics and hospitalization this is followed by wound care as well as nursing.  #4-history of CHF this appears stable edema is fairly minimal today he is on Lasix every other day with potassium supplementation every other day will update a metabolic panel.  #5-history normocytic anemia hemoglobin at 10.6 on lab done last month appears to be relatively baseline Will update this as well.  #6-history BPH continues on Flomax with a chronic indwelling catheter this is been stable for some time.  #7 history of acute kidney injury this again has been stable for some time with a creatinine of 1.18 on recent lab again will update this especially since he is on Lasix.  #8 history of restless legs he continues on Requip is not complaining of  that today again he is more concerned about his left hip and knee pain as noted above.   #9-history of depression-at one point several months ago he had made some depressive statements about being a burden on people by living-however this was quite transitory in fact next said he did not feel that way and was somewhat embarrassed that he even said that-he was seen by psychiatric services and was thought not to be acutely depressed he is on Zoloft and continues to be his cheerful interactive self today which is encouraging.  #10 history of constipation this is stable on current medications is not complaining of constipation currently he is on Colace routinely as well as a fleets enema as needed as well as milk of magnesia as needed.  #11 history of insomnia he's been started on melatonin apparently this is helping.  Of note again will update a CBC and metabolic panel for updated values will obtain an orthopedic consult and also his diet will be changed to dysphagia 2 as noted above.  [id number] note greater than 40 minutes spent assessing patient-discussing his status with nursing staff-reviewing his chart and labs-and coordinating and formulating a plan of care for numerous diagnoses-of note greater than 50% of time spent coordinating plan of care

## 2015-06-10 ENCOUNTER — Encounter (HOSPITAL_COMMUNITY)
Admission: RE | Admit: 2015-06-10 | Discharge: 2015-06-10 | Disposition: A | Discharge status: Home / Self Care | Payer: Medicare Other | Source: Skilled Nursing Facility | Attending: Internal Medicine | Admitting: Internal Medicine

## 2015-06-10 DIAGNOSIS — S91109D Unspecified open wound of unspecified toe(s) without damage to nail, subsequent encounter: Secondary | ICD-10-CM | POA: Insufficient documentation

## 2015-06-10 DIAGNOSIS — R339 Retention of urine, unspecified: Secondary | ICD-10-CM | POA: Diagnosis not present

## 2015-06-10 DIAGNOSIS — I259 Chronic ischemic heart disease, unspecified: Secondary | ICD-10-CM | POA: Diagnosis not present

## 2015-06-10 DIAGNOSIS — X58XXXD Exposure to other specified factors, subsequent encounter: Secondary | ICD-10-CM | POA: Diagnosis not present

## 2015-06-10 DIAGNOSIS — M1000 Idiopathic gout, unspecified site: Secondary | ICD-10-CM | POA: Diagnosis not present

## 2015-06-10 LAB — BASIC METABOLIC PANEL
ANION GAP: 8 (ref 5–15)
BUN: 21 mg/dL — ABNORMAL HIGH (ref 6–20)
BUN: 21 mg/dL (ref 4–21)
CALCIUM: 8.9 mg/dL (ref 8.9–10.3)
CHLORIDE: 101 mmol/L (ref 101–111)
CO2: 28 mmol/L (ref 22–32)
CREATININE: 1.1 mg/dL (ref 0.6–1.3)
CREATININE: 1.11 mg/dL (ref 0.61–1.24)
GFR calc non Af Amer: 53 mL/min — ABNORMAL LOW (ref >60)
Glucose, Bld: 92 mg/dL (ref 65–99)
Glucose: 92 mg/dL
Potassium: 4.3 mmol/L (ref 3.4–5.3)
Potassium: 4.3 mmol/L (ref 3.5–5.1)
SODIUM: 137 mmol/L (ref 137–147)
SODIUM: 137 mmol/L (ref 135–145)

## 2015-06-10 LAB — CBC
HCT: 36.5 % — ABNORMAL LOW (ref 39.0–52.0)
HEMOGLOBIN: 11.9 g/dL — AB (ref 13.0–17.0)
MCH: 28.3 pg (ref 26.0–34.0)
MCHC: 32.6 g/dL (ref 30.0–36.0)
MCV: 86.7 fL (ref 78.0–100.0)
PLATELETS: 224 10*3/uL (ref 150–400)
RBC: 4.21 MIL/uL — AB (ref 4.22–5.81)
RDW: 14.4 % (ref 11.5–15.5)
WBC: 6.9 10*3/uL (ref 4.0–10.5)

## 2015-06-10 LAB — CBC AND DIFFERENTIAL: WBC: 6.9 10^3/mL

## 2015-06-15 ENCOUNTER — Encounter: Payer: Self-pay | Admitting: Nurse Practitioner

## 2015-06-15 ENCOUNTER — Ambulatory Visit (INDEPENDENT_AMBULATORY_CARE_PROVIDER_SITE_OTHER): Payer: Medicare Other | Admitting: Nurse Practitioner

## 2015-06-15 VITALS — BP 113/59 | HR 60 | Temp 97.6°F | Ht 71.0 in | Wt 158.0 lb

## 2015-06-15 DIAGNOSIS — K219 Gastro-esophageal reflux disease without esophagitis: Secondary | ICD-10-CM | POA: Diagnosis not present

## 2015-06-15 DIAGNOSIS — R1310 Dysphagia, unspecified: Secondary | ICD-10-CM

## 2015-06-15 NOTE — Progress Notes (Signed)
cc'ed to pcp °

## 2015-06-15 NOTE — Patient Instructions (Signed)
1. Continue taking Omeprazole 2. Continue current dysphagia diet and supplementation as needed for adequate nutrition 3. Return for follow-up as needed for recurrent symptoms or worsening symptoms.

## 2015-06-15 NOTE — Assessment & Plan Note (Signed)
Recent barium pill esophagram completed. Findings include severe esophageal dysmotility disorder, likely age-related. At this point he is doing well on dysphagia 2 diet with thin liquids as well as supplementation. Recommend he continue this diet, chew food well, monitoring during eating to prevent aspiration. Continue proton pump inhibitor as well. Return for follow-up as needed

## 2015-06-15 NOTE — Assessment & Plan Note (Signed)
His GERD symptoms are generally well controlled. Eating well, putting on weight. Per staff, patient has nausea and regurgitation anytime he is on doxycycline/Cipro combination. Recommend avoiding medications that cause nausea and regurgitation as air likely to decrease his oral intake and cause further dysphagia symptoms. Return for follow-up as needed.

## 2015-06-15 NOTE — Progress Notes (Signed)
Referring Provider: Glenda Chroman, MD Primary Care Physician:  Glenda Chroman, MD Primary GI:  Dr. Oneida Alar  Chief Complaint  Patient presents with  . Follow-up    HPI:   Patrick [name] is a 82 y.o. male who presents for posthospitalization follow-up. He was admitted to hospital on 04/28/2015 from the nursing facility due to inability to eat or drink. Noted to have cellulitis of the foot was started on antibiotics. Described nausea as well. Also was diagnosed with UTI. Difficulty swallowing possibly due to stricture, improved during hospitalization on PPI. GI consult deemed likely due to age-related dysmotility. Barium pill esophagram completed on 05/01/2015 found diffuse severe esophageal dysmotility question age-related. He was discharged on 05/01/2015 to the [place] home.   Nursing home provider follow-up 05/07/2015 notes the patient denies any gastrointestinal symptoms. Follow-up visit on 06/09/2015 notes improvement on PPI, patient complaining about consistency of food at which point his diet was changed to dysphagia 2 diet.  Today the patient is accompanied by nursing home staff. He is hard of hearing. He states hsi swallowing is doing well. No heartburn. "I'm feeling just fine, putting weight back on." States he's eating well, likes the food. Also taking nutrition supplement. Denies hematochezia/melena. Denies N/V. Staff with him states nursing told her that he has nausea/regurgitation anytime he's on the Doxy/Cipro combination. Denies any other upper or lower GI symptoms.   Past Medical History  Diagnosis Date  . Anxiety   . HTN (hypertension)   . Reflux   . Skin cancer, basal cell   . Diverticulitis   . Hyperlipidemia   . Inguinal hernia   . Ischemic heart disease   . Hiatal hernia   . Kidney stone   . Esophageal stricture   . History of recurrent TIAs   . Restless leg syndrome   . GERD (gastroesophageal reflux disease)   . Pleural effusion   . CHF  (congestive heart failure) ([place])   . Urinary retention   . DNR (do not resuscitate)   . DJD (degenerative joint disease), cervical   . Osteoarthritis     bilat knees  . Gout   . Right hip pain   . Bilateral foot pain   . Esophageal dysmotility     age related per BPE 04/2015    Past Surgical History  Procedure Laterality Date  . Intestines    . Exploratory laparotomy w/ bowel resection    . Esophageal dilation    . Fracture surgery    . Hemiarthroplasty hip     Medications: Available in "rooming" tab, under outpatient medications. Computer issue will not allow meds to appear in office note despite several attempts and "refreshing."  No current outpatient prescriptions on file.   No current facility-administered medications for this visit.    Allergies as of 06/15/2015 - Review Complete 06/15/2015  Allergen Reaction Noted  . Celebrex [celecoxib] Other (See Comments) 06/03/2010  . Codeine Other (See Comments) 06/03/2010    Family History  Problem Relation Age of Onset  . Arthritis      Social History   Social History  . Marital Status: Married    Spouse Name: N/A  . Number of Children: N/A  . Years of Education: na   Occupational History  . retired    Social History Main Topics  . Smoking status: Never Smoker   . Smokeless tobacco: Never Used  . Alcohol Use: No  . Drug Use: No  . Sexual Activity: Not  Asked   Other Topics Concern  . None   Social History Narrative    Review of Systems: 10-point ROS negative except as per HPI.   Physical Exam: BP 113/59 mmHg  Pulse 60  Temp(Src) 97.6 F (36.4 C) (Oral)  Ht 5\' 11"  (1.803 m)  Wt 158 lb (71.668 kg)  BMI 22.05 kg/m2 General:   Alert and oriented. Pleasant and cooperative. Well-nourished and well-developed.  Head:  Normocephalic and atraumatic. Eyes:  Without icterus, sclera clear and conjunctiva pink.  Ears:  Hard of hearing. Cardiovascular:  S1, S2 present without murmurs appreciated. Extremities  without clubbing or edema. Respiratory:  Clear to auscultation bilaterally. No wheezes, rales, or rhonchi. No distress.  Gastrointestinal:  +BS, soft, non-tender and non-distended. No guarding or rebound.  Rectal:  Deferred  Musculoskalatal:  In a wheelchair. Neurologic:  Alert and oriented x4;  grossly normal neurologically. Psych:  Alert and cooperative. Normal mood and affect. Heme/Lymph/Immune: No excessive bruising noted.    06/15/2015 11:16 AM   Disclaimer: This note was dictated with voice recognition software. Similar sounding words can inadvertently be transcribed and may not be corrected upon review.

## 2015-06-17 ENCOUNTER — Other Ambulatory Visit: Payer: Self-pay | Admitting: *Deleted

## 2015-06-17 ENCOUNTER — Non-Acute Institutional Stay (SKILLED_NURSING_FACILITY): Payer: Medicare Other | Admitting: Internal Medicine

## 2015-06-17 DIAGNOSIS — L989 Disorder of the skin and subcutaneous tissue, unspecified: Secondary | ICD-10-CM

## 2015-06-17 MED ORDER — HYDROCODONE-ACETAMINOPHEN 5-325 MG PO TABS
ORAL_TABLET | ORAL | Status: DC
Start: 2015-06-17 — End: 2015-06-23

## 2015-06-17 NOTE — Progress Notes (Signed)
Patient ID: Patrick [name], male   DOB: [birth year], 83 y.o.   MRN: [id number]

## 2015-06-21 NOTE — Progress Notes (Signed)
Patient ID: Patrick [name], male   DOB: 12-Dec-1913, 83 y.o.   MRN: [id number]    Location:  [facility] Room Number: 139 Place of Service:  SNF (31)   Glenda Chroman, MD  Patient Care Team: Glenda Chroman, MD as PCP - General (Internal Medicine) Danie Binder, MD as Consulting Physician (Gastroenterology)  Extended Emergency Contact Information Primary Emergency Contact: Black,Shirley Address: [address]          [place], [place] [address] [name] of [place] Phone: [phone] Mobile Phone: [phone] Relation: Daughter   Goals of care: Advanced Directive information Advanced Directives 06/17/2015  Does patient have an advance directive? Yes  Type of Advance Directive Out of facility DNR (pink MOST or yellow form)  Does patient want to make changes to advanced directive? No - Patient declined  Copy of advanced directive(s) in chart? Yes  Pre-existing out of facility DNR order (yellow form or pink MOST form) Yellow form placed in chart (order not valid for inpatient use)     Chief Complaint  Patient presents with  . Acute Visit    Left Ear Lesion   .   HPI:  Pt is a 82 y.o. male seen today for suspicious looking left ear lesion-apparently it is bleeding and I am following up on this.  Patient does have a history of numerous solar induced changes and has seen dermatology in the past on a fairly frequent basis secondary to various skin issues-   He states this lesion on his lower left ear just recently started bleeding he does not report any trauma to the area       Past Medical History  Diagnosis Date  . Anxiety   . HTN (hypertension)   . Reflux   . Skin cancer, basal cell   . Diverticulitis   . Hyperlipidemia   . Inguinal hernia   . Ischemic heart disease   . Hiatal hernia   . Kidney stone   . Esophageal stricture   . History of recurrent TIAs   . Restless leg syndrome   . GERD (gastroesophageal reflux disease)   .  Pleural effusion   . CHF (congestive heart failure) ([place])   . Urinary retention   . DNR (do not resuscitate)   . DJD (degenerative joint disease), cervical   . Osteoarthritis     bilat knees  . Gout   . Right hip pain   . Bilateral foot pain   . Esophageal dysmotility     age related per BPE 04/2015   Past Surgical History  Procedure Laterality Date  . Intestines    . Exploratory laparotomy w/ bowel resection    . Esophageal dilation    . Fracture surgery    . Hemiarthroplasty hip      Allergies  Allergen Reactions  . Celebrex [Celecoxib] Other (See Comments)    unknown  . Codeine Other (See Comments)    unknown    Current Outpatient Prescriptions on File Prior to Visit  Medication Sig Dispense Refill  . Amino Acids-Protein Hydrolys (FEEDING SUPPLEMENT, PRO-STAT SUGAR FREE 64,) LIQD Take 30 mLs by mouth 2 (two) times daily.    Marland Kitchen aspirin EC 81 MG tablet Take 81 mg by mouth daily.    . bisacodyl (DULCOLAX) 10 MG suppository Place 10 mg rectally as needed for mild constipation or moderate constipation.    . Cholecalciferol (VITAMIN D) 2000 UNITS CAPS Take 1 capsule by mouth daily.    Marland Kitchen  docusate sodium (COLACE) 100 MG capsule Take 100 mg by mouth daily. For constipation.    . furosemide (LASIX) 20 MG tablet Take 20 mg by mouth every other day. For edema.    Marland Kitchen ipratropium-albuterol (DUONEB) 0.5-2.5 (3) MG/3ML SOLN Take 3 mLs by nebulization every 6 (six) hours as needed (shortness of breath).    . magnesium hydroxide (MILK OF MAGNESIA) 400 MG/5ML suspension Take 30 mLs by mouth daily as needed for mild constipation. If no relief try a Fleets enema, after 2 days.    . Melatonin 3 MG CAPS Take 3 mg by mouth at bedtime as needed (for sleep).    Marland Kitchen omeprazole (PRILOSEC) 20 MG capsule Take 20 mg by mouth daily.    . OXYGEN Inhale 2 L into the lungs continuous.    . potassium chloride (K-DUR,KLOR-CON) 10 MEQ tablet Take 10 mEq by mouth every other day.     . promethazine (PHENERGAN)  12.5 MG tablet Take 12.5 mg by mouth every 6 (six) hours as needed for nausea or vomiting.    . sertraline (ZOLOFT) 50 MG tablet Take 50 mg by mouth at bedtime.     . sodium phosphate (FLEET) enema Place 1 enema rectally once. follow package directions    . tamsulosin (FLOMAX) 0.4 MG CAPS capsule Take 0.4 mg by mouth daily.     No current facility-administered medications on file prior to visit.     Review of Systems   In general complaints of fever chills   skin as noted above does have a left ear lesion is bleeding does not complaining of acute pain with this.  Respiratory does not diminish shortness breath or cough.  Cardiac no chest pain mild lower extremity edema.  GI has had dysphagia issues this appears to be improved he actually has GI follow-up scheduled.  Muscle skeletal is not complaining of joint pain at this time    Immunization History  Administered Date(s) Administered  . Influenza,inj,Quad PF,36+ Mos 12/07/2014  . Influenza-Unspecified 11/20/2013  . Pneumococcal-Unspecified 02/14/2009   Pertinent  Health Maintenance Due  Topic Date Due  . PNA vac Low Risk Adult (2 of 2 - PCV13) 02/14/2010  . INFLUENZA VACCINE  09/15/2015      Filed Vitals:   06/17/15 1530  BP: 116/73  Pulse: 70  Temp: 97.4 F (36.3 C)  TempSrc: Oral  Resp: 16  Height: 5\' 11"  (1.803 m)  Weight: 162 lb 6.4 oz (73.664 kg)   Body mass index is 22.66 kg/(m^2). Physical Exam      In general is a pleasantly male in no distress sitting comfortably in his wheelchair.  His skin is warm and dry he has numerous solar induced changes skin cancer removal sites that appear to be stable.-However he has developed a lesion on his lower left ear-it appears to be intermittently bleeding it is friable has somewhat of a crusted rolled border presentation I do not see any surrounding erythema which would indicate cellulitis  Oropharynx clear mucous membranes moist.     Chest is clear with  shallow air entry could not really appreciate any overt congestion no labored breathing. Heart sounds are somewhat distant Heart is regular rate and rhythm without murmur gallop or rub he has trace edema lower extremities bilaterally.--  Abdomen is protuberant nontender well-healed surgical scar in right-sided hernia which appears unchanged from previous exams he has positive bowel sounds.     Neurologic is grossly intact to speech is clear no lateralizing findings.  Psych  he continues to be largely alert and oriented pleasant cooperative which is his baseline.    Labs reviewed:  Recent Labs  11/03/14 0700  04/28/15 1655 04/29/15 0448 06/10/15 06/10/15 0715  NA  --   < > 137 139 137 137  K  --   < > 4.8 3.9 4.3 4.3  CL  --   < > 100* 104  --  101  CO2  --   < > 28 28  --  28  GLUCOSE  --   < > 118* 88  --  92  BUN  --   < > 19 17 21  21*  CREATININE  --   < > 1.27* 1.18 1.1 1.11  CALCIUM  --   < > 8.8* 8.4*  --  8.9  MG 2.0  --   --   --   --   --   < > = values in this interval not displayed.  Recent Labs  04/12/15 1535 04/28/15 1655 04/29/15 0448  AST 20 24 18   ALT 13* 15* 12*  ALKPHOS 105 94 77  BILITOT 0.7 0.7 0.7  PROT 8.0 7.2 6.3*  ALBUMIN 3.8 3.6 3.0*    Recent Labs  04/12/15 1535 04/25/15 0427 04/28/15 1655 04/29/15 0448 06/10/15 06/10/15 0715  WBC 14.1* 6.8 10.4 11.6* 6.9 6.9  NEUTROABS 11.6* 3.4 8.3*  --   --   --   HGB 11.9* 10.4* 11.7* 10.6*  --  11.9*  HCT 36.3* 32.2* 35.2* 31.8*  --  36.5*  MCV 86.8 88.7 86.9 87.6  --  86.7  PLT 310 189 232 209  --  224   No results found for: TSH Lab Results  Component Value Date   HGBA1C 6.1* 11/12/2014     : Assessment and plan.  -Left ear lesion-with patient's history of skin cancer feel we need expedient dermatology consult to rule out any pathology here-it does not appear to be infected however with his history certainly will have to be followed up on-   [id number]

## 2015-06-22 ENCOUNTER — Other Ambulatory Visit: Payer: Self-pay | Admitting: *Deleted

## 2015-06-22 ENCOUNTER — Telehealth: Payer: Self-pay | Admitting: Orthopedic Surgery

## 2015-06-22 ENCOUNTER — Ambulatory Visit (INDEPENDENT_AMBULATORY_CARE_PROVIDER_SITE_OTHER): Payer: Medicare Other | Admitting: Orthopedic Surgery

## 2015-06-22 VITALS — BP 80/53 | Ht 71.0 in | Wt 162.0 lb

## 2015-06-22 DIAGNOSIS — S72002S Fracture of unspecified part of neck of left femur, sequela: Secondary | ICD-10-CM | POA: Diagnosis not present

## 2015-06-22 DIAGNOSIS — M25552 Pain in left hip: Secondary | ICD-10-CM

## 2015-06-22 NOTE — Progress Notes (Signed)
Patient ID: Patrick [name], male   DOB: 1913-09-29, 82 y.o.   MRN: [id number]  Chief Complaint  Patient presents with  . Follow-up    HIP PAIN, REQUEST INJECTION., However no evaluation has been done regarding his left hip for a injection; simply requested by the nursing home     HPI 82 year old male primarily resides in the wheelchair had a left hip fracture treated with a bipolar prosthesis by another physician presents with a request of the nursing home for evaluation of left hip pain  Review of Systems  Musculoskeletal: Positive for joint pain. Negative for back pain.  Neurological: Negative for tingling and sensory change.       Denies radiating pain    Past Medical History  Diagnosis Date  . Anxiety   . HTN (hypertension)   . Reflux   . Skin cancer, basal cell   . Diverticulitis   . Hyperlipidemia   . Inguinal hernia   . Ischemic heart disease   . Hiatal hernia   . Kidney stone   . Esophageal stricture   . History of recurrent TIAs   . Restless leg syndrome   . GERD (gastroesophageal reflux disease)   . Pleural effusion   . CHF (congestive heart failure) ([name])   . Urinary retention   . DNR (do not resuscitate)   . DJD (degenerative joint disease), cervical   . Osteoarthritis     bilat knees  . Gout   . Right hip pain   . Bilateral foot pain   . Esophageal dysmotility     age related per BPE 04/2015     BP 80/53 mmHg  Ht 5\' 11"  (1.803 m)  Wt 162 lb (73.483 kg)  BMI 22.60 kg/m2  Physical Exam Physical Exam  Constitutional: The patient is oriented to person, place, and time. The patient appears well-developed and adequately nourished. No distress.  Cardiovascular: Intact distal pulses.   Neurological: The patient is alert and oriented to person, place, and time. The patient exhibits normal muscle tone. Coordination normal.  Skin: Skin is warm and dry. No rash noted. The patient is not diaphoretic. No erythema. No pallor.  Psychiatric: The patient has a  normal mood and affect. Her behavior is normal. Judgment and thought content normal.   He was wheeled in in a wheelchair and they confirm that he primarily stent spends most of his time in the wheelchair    Ortho Exam   There is an incision which looks to be related to his hip fracture surgery. Primarily a posterior approach incision was probably used and is clean dry and intact no erythema. He has tenderness in his left gluteal area and none over the left greater trochanter to suggest bursitis  His hip flexion is normal by passive range of motion test, no atrophy no instability  No peripheral edema no peripheral leg swelling   ASSESSMENT AND PLAN   Procedure note injection for left hip pain Verbal consent was obtained for injection of the  left hip  Timeout was completed to confirm the injection site  The medications used were 40 mg of Depo-Medrol and 1% lidocaine 3 cc  Anesthesia was provided by ethyl chloride and the skin was prepped with alcohol.  After cleaning the skin with alcohol a 25-gauge needle was used to inject the intramuscular area of the left hip

## 2015-06-22 NOTE — Telephone Encounter (Signed)
NOTED, ORDER SENT TO [facility]

## 2015-06-22 NOTE — Telephone Encounter (Signed)
Tammy from [facility] called to let Dr. Aline Brochure know that  Patrick [name] has Medicare and with Medicare his address is listed with him living in [place], [place]. They have to go by what Medicare has, so  that is out of their competive bid area and they would not be able to bill for the  Rx for the wheelchair cushion.

## 2015-06-23 ENCOUNTER — Encounter: Payer: Self-pay | Admitting: Internal Medicine

## 2015-06-23 ENCOUNTER — Non-Acute Institutional Stay (SKILLED_NURSING_FACILITY): Payer: Medicare Other | Admitting: Internal Medicine

## 2015-06-23 DIAGNOSIS — H9390 Unspecified disorder of ear, unspecified ear: Secondary | ICD-10-CM

## 2015-06-23 DIAGNOSIS — H61899 Other specified disorders of external ear, unspecified ear: Secondary | ICD-10-CM

## 2015-06-23 DIAGNOSIS — M25552 Pain in left hip: Secondary | ICD-10-CM

## 2015-06-23 NOTE — Progress Notes (Signed)
Location:  [facility] Room Number: [place] of Service:  SNF [phone]) Provider:  Imogene Burn, MD  Patient Care Team: Glenda Chroman, MD as PCP - General (Internal Medicine) Danie Binder, MD as Consulting Physician (Gastroenterology)  Extended Emergency Contact Information Primary Emergency Contact: Black,Shirley Address: [address]          [place], [place] [address] [name] of [place] Phone: [phone] Mobile Phone: [phone] Relation: Daughter  Code Status: DNR Goals of care: Advanced Directive information Advanced Directives 06/23/2015  Does patient have an advance directive? Yes  Type of Advance Directive Out of facility DNR (pink MOST or yellow form)  Does patient want to make changes to advanced directive? No - Patient declined  Copy of advanced directive(s) in chart? Yes  Pre-existing out of facility DNR order (yellow form or pink MOST form) -     Chief complaint-acute visit follow-up left hip discomfort-left ear lesion  HPI:  Pt is a 82 y.o. male seen today for an acute visit for previous complaints of left hip discomfort-she is receiving Vicodin 5-3 25 mg every discussed when necessary pain.  He did see orthopedics yesterday-and was given a steroid injection.  He states this is helping.  States this morning he did have some hip pain but this appears to have resolved.  He does have a history of by previous left hip replacement.  Patient also was seen recently for a bleeding lesion on his left ear-it appears the bleeding has largely stopped however he still appears grossly abnormal with crusting fissured appearance and dried blood-this will warrant expedient dermatology follow-up and apparently that will be carried out later this week.  I do not see signs of increased erythema drainage or sign of cellulitis here.     Past Medical History  Diagnosis Date  . Anxiety   . HTN (hypertension)   . Reflux   . Skin  cancer, basal cell   . Diverticulitis   . Hyperlipidemia   . Inguinal hernia   . Ischemic heart disease   . Hiatal hernia   . Kidney stone   . Esophageal stricture   . History of recurrent TIAs   . Restless leg syndrome   . GERD (gastroesophageal reflux disease)   . Pleural effusion   . CHF (congestive heart failure) ([place])   . Urinary retention   . DNR (do not resuscitate)   . DJD (degenerative joint disease), cervical   . Osteoarthritis     bilat knees  . Gout   . Right hip pain   . Bilateral foot pain   . Esophageal dysmotility     age related per BPE 04/2015   Past Surgical History  Procedure Laterality Date  . Intestines    . Exploratory laparotomy w/ bowel resection    . Esophageal dilation    . Fracture surgery    . Hemiarthroplasty hip      Allergies  Allergen Reactions  . Celebrex [Celecoxib] Other (See Comments)    unknown  . Codeine Other (See Comments)    unknown    Outpatient Encounter Prescriptions as of 06/23/2015  Medication Sig  . Amino Acids-Protein Hydrolys (FEEDING SUPPLEMENT, PRO-STAT SUGAR FREE 64,) LIQD Take 30 mLs by mouth 2 (two) times daily.  Marland Kitchen aspirin EC 81 MG tablet Take 81 mg by mouth daily.  . Cholecalciferol (VITAMIN D) 2000 UNITS CAPS Take 1 capsule by mouth daily.  Marland Kitchen docusate sodium (COLACE)  100 MG capsule Take 100 mg by mouth daily. For constipation.  . furosemide (LASIX) 20 MG tablet Take 20 mg by mouth every other day. For edema.  Marland Kitchen HYDROcodone-acetaminophen (NORCO/VICODIN) 5-325 MG tablet Take 1 tablet by mouth every 6 (six) hours as needed for moderate pain.  . magnesium hydroxide (MILK OF MAGNESIA) 400 MG/5ML suspension Take 30 mLs by mouth daily as needed for mild constipation. If no relief try a Fleets enema, after 2 days.  . Melatonin 3 MG CAPS Take 3 mg by mouth at bedtime as needed (for sleep).  . NON FORMULARY Magic Cup once a day  . omeprazole (PRILOSEC) 20 MG capsule Take 20 mg by mouth daily.  . OXYGEN Inhale 2 L into  the lungs continuous.  . potassium chloride (K-DUR,KLOR-CON) 10 MEQ tablet Take 10 mEq by mouth every other day.   Marland Kitchen rOPINIRole (REQUIP) 3 MG tablet Take one tablet by mouth at bedtime  . sertraline (ZOLOFT) 50 MG tablet Take 50 mg by mouth at bedtime.   . sodium phosphate (FLEET) enema Place 1 enema rectally once. follow package directions  . tamsulosin (FLOMAX) 0.4 MG CAPS capsule Take 0.4 mg by mouth daily.  . Vitamins A & D (VITAMIN A & D) ointment Apply A&D ointment to bilateral feet and toes BID & PRN  . [DISCONTINUED] bisacodyl (DULCOLAX) 10 MG suppository Place 10 mg rectally as needed for mild constipation or moderate constipation.  . [DISCONTINUED] HYDROcodone-acetaminophen (NORCO/VICODIN) 5-325 MG tablet Take one tablet by mouth four times a day as needed for pain DO NOT EXCEED 3 GM APAP IN 24 HOURS FROM ALL SOURCES  . [DISCONTINUED] ipratropium-albuterol (DUONEB) 0.5-2.5 (3) MG/3ML SOLN Take 3 mLs by nebulization every 6 (six) hours as needed (shortness of breath).  . [DISCONTINUED] promethazine (PHENERGAN) 12.5 MG tablet Take 12.5 mg by mouth every 6 (six) hours as needed for nausea or vomiting.   No facility-administered encounter medications on file as of 06/23/2015.       Review of Systems  In general is not complaining of fever chills says his hip feels better.  Skin again here issues as noted above is not really complaining of pain with this apparently they're still some bleeding intermittently.  Head ears eyes nose mouth and throat again other than here issues no complaints.  Respirated does not complain shortness breath or cough.  Cardiac no chest pain.  Muscle skeletal again says left hip pain appears better controlled.  Neurologic is not complaining of dizziness or headache   Immunization History  Administered Date(s) Administered  . Influenza,inj,Quad PF,36+ Mos 12/07/2014  . Influenza-Unspecified 11/20/2013  . PPD Test 03/16/2013  .  Pneumococcal-Unspecified 02/14/2009   Pertinent  Health Maintenance Due  Topic Date Due  . PNA vac Low Risk Adult (2 of 2 - PCV13) 02/15/2016 (Originally 02/14/2010)  . INFLUENZA VACCINE  09/15/2015   No flowsheet data found. Functional Status Survey:    Filed Vitals:   06/23/15 0917  BP: 121/57  Pulse: 67  Temp: 98.2 F (36.8 C)  TempSrc: Oral  Resp: 20  Height: 5\' 11"  (1.803 m)  Weight: 162 lb 6.4 oz (73.664 kg)   Body mass index is 22.66 kg/(m^2). Physical Exam   In general this is a pleasant elderly male who looks younger than his stated age.  His skin is warm and dry lesion left lower ear is not bleeding at this point still has a fissured somewhat rolled borders darkened appearance in the middle-there is dried blood--I do  not see any surrounding erythema or sign of cellulitis  Chest is clear to auscultation there is no labored breathing.  Heart is regular rate and rhythm somewhat distant heart sounds he has minimal lower extremity edema.  Musculoskeletal there is not really tenderness to palpation of the left hip area I do not note any deformity there is a history of a well-healed surgical scar.  Neurologic is grossly intact his speech is clear.  Psych continues to be alert nor any pleasant and appropriate states he'll be turning 89 in about 5 weeks and is looking forward to that  Labs reviewed:  Recent Labs  11/03/14 0700  04/28/15 1655 04/29/15 0448 06/10/15 06/10/15 0715  NA  --   < > 137 139 137 137  K  --   < > 4.8 3.9 4.3 4.3  CL  --   < > 100* 104  --  101  CO2  --   < > 28 28  --  28  GLUCOSE  --   < > 118* 88  --  92  BUN  --   < > 19 17 21  21*  CREATININE  --   < > 1.27* 1.18 1.1 1.11  CALCIUM  --   < > 8.8* 8.4*  --  8.9  MG 2.0  --   --   --   --   --   < > = values in this interval not displayed.  Recent Labs  04/12/15 1535 04/28/15 1655 04/29/15 0448  AST 20 24 18   ALT 13* 15* 12*  ALKPHOS 105 94 77  BILITOT 0.7 0.7 0.7  PROT 8.0  7.2 6.3*  ALBUMIN 3.8 3.6 3.0*    Recent Labs  04/12/15 1535 04/25/15 0427 04/28/15 1655 04/29/15 0448 06/10/15 06/10/15 0715  WBC 14.1* 6.8 10.4 11.6* 6.9 6.9  NEUTROABS 11.6* 3.4 8.3*  --   --   --   HGB 11.9* 10.4* 11.7* 10.6*  --  11.9*  HCT 36.3* 32.2* 35.2* 31.8*  --  36.5*  MCV 86.8 88.7 86.9 87.6  --  86.7  PLT 310 189 232 209  --  224   No results found for: TSH Lab Results  Component Value Date   HGBA1C 6.1* 11/12/2014   No results found for: CHOL, HDL, LDLCALC, LDLDIRECT, TRIG, CHOLHDL  Significant Diagnostic Results in last 30 days:  No results found.  Assessment/Plan #1-left hip pain steroid injection appears to have helped at this point will monitor he does have Norco for breakthrough pain as noted above.  #2 left ear lesion-again this will need expedient follow-up by dermatology I do not see signs of drainage bleeding extending erythema or cellulitis at this time.  [id number].      Oralia Manis, [place]

## 2015-06-23 NOTE — Progress Notes (Signed)
Patient ID: Patrick [name], male   DOB: 09-26-1913, 82 y.o.   MRN: [id number]

## 2015-06-25 DIAGNOSIS — C44229 Squamous cell carcinoma of skin of left ear and external auricular canal: Secondary | ICD-10-CM | POA: Diagnosis not present

## 2015-07-07 ENCOUNTER — Non-Acute Institutional Stay (SKILLED_NURSING_FACILITY): Payer: Medicare Other | Admitting: Internal Medicine

## 2015-07-07 ENCOUNTER — Encounter: Payer: Self-pay | Admitting: Internal Medicine

## 2015-07-07 DIAGNOSIS — L0390 Cellulitis, unspecified: Secondary | ICD-10-CM | POA: Diagnosis not present

## 2015-07-07 DIAGNOSIS — H61899 Other specified disorders of external ear, unspecified ear: Secondary | ICD-10-CM | POA: Diagnosis not present

## 2015-07-07 DIAGNOSIS — H9390 Unspecified disorder of ear, unspecified ear: Secondary | ICD-10-CM | POA: Insufficient documentation

## 2015-07-07 NOTE — Progress Notes (Deleted)
Location:  [facility] Room Number: 139/W Place of Service:  SNF [phone]) Provider:  Imogene Burn, MD  Patient Care Team: Glenda Chroman, MD as PCP - General (Internal Medicine) Danie Binder, MD as Consulting Physician (Gastroenterology)  Extended Emergency Contact Information Primary Emergency Contact: Black,Shirley Address: [address]          [place], [place] [address] [name] of [place] Phone: [phone] Mobile Phone: [phone] Relation: Daughter  Code Status:  DNR Goals of care: Advanced Directive information Advanced Directives 07/07/2015  Does patient have an advance directive? Yes  Type of Advance Directive Out of facility DNR (pink MOST or yellow form)  Does patient want to make changes to advanced directive? No - Patient declined  Copy of advanced directive(s) in chart? Yes    Chief complaint-acute visit follow-up left ear lesion   HPI:  Pt is a 82 y.o. male seen today for follow-up of left ear lesion.  I saw this yesterday M area appeared to be somewhat concerning for cellulitis with significant erythema of the left helix and appeared to have some moist changes to biopsy site.  The area was seen recently by dermatology secondary to r suspicious crusted lesion on the left helix-he is followed by Dr. Nevada Crane dermatologist.  We have ordered a follow-up with Dr. Nevada Crane to get his assessment of his ear-and this apparently has been arranged.  He was started on Bactrim yesterday secondary to concerns again of cellulitis.  The ear today does look somewhat improved the erythema persists but is less inflamed appearing the area actually appears drier as well-thiswill still certainly warrant follow up by dermatology .      \  Past Medical History  Diagnosis Date  . Anxiety   . HTN (hypertension)   . Reflux   . Skin cancer, basal cell   . Diverticulitis   . Hyperlipidemia   . Inguinal hernia   . Ischemic heart disease     . Hiatal hernia   . Kidney stone   . Esophageal stricture   . History of recurrent TIAs   . Restless leg syndrome   . GERD (gastroesophageal reflux disease)   . Pleural effusion   . CHF (congestive heart failure) ([facility])   . Urinary retention   . DNR (do not resuscitate)   . DJD (degenerative joint disease), cervical   . Osteoarthritis     bilat knees  . Gout   . Right hip pain   . Bilateral foot pain   . Esophageal dysmotility     age related per BPE 04/2015   Past Surgical History  Procedure Laterality Date  . Intestines    . Exploratory laparotomy w/ bowel resection    . Esophageal dilation    . Fracture surgery    . Hemiarthroplasty hip      Allergies  Allergen Reactions  . Celebrex [Celecoxib] Other (See Comments)    unknown  . Codeine Other (See Comments)    unknown    Current Outpatient Prescriptions on File Prior to Visit  Medication Sig Dispense Refill  . Amino Acids-Protein Hydrolys (FEEDING SUPPLEMENT, PRO-STAT SUGAR FREE 64,) LIQD Take 30 mLs by mouth 2 (two) times daily.    Marland Kitchen aspirin EC 81 MG tablet Take 81 mg by mouth daily.    . Cholecalciferol (VITAMIN D) 2000 UNITS CAPS Take 1 capsule by mouth daily.    Marland Kitchen docusate sodium (COLACE) 100 MG capsule Take 100 mg  by mouth daily. For constipation.    . furosemide (LASIX) 20 MG tablet Take 20 mg by mouth every other day. For edema.    Marland Kitchen HYDROcodone-acetaminophen (NORCO/VICODIN) 5-325 MG tablet Take 1 tablet by mouth every 6 (six) hours as needed for moderate pain.    . magnesium hydroxide (MILK OF MAGNESIA) 400 MG/5ML suspension Take 30 mLs by mouth daily as needed for mild constipation. If no relief try a Fleets enema, after 2 days.    . Melatonin 3 MG CAPS Take 3 mg by mouth at bedtime as needed (for sleep).    . NON FORMULARY Magic Cup once a day    . omeprazole (PRILOSEC) 20 MG capsule Take 20 mg by mouth daily.    . OXYGEN Inhale 2 L into the lungs continuous.    . potassium chloride (K-DUR,KLOR-CON) 10  MEQ tablet Take 10 mEq by mouth every other day.     Marland Kitchen rOPINIRole (REQUIP) 3 MG tablet Take one tablet by mouth at bedtime    . sertraline (ZOLOFT) 50 MG tablet Take 50 mg by mouth at bedtime.     . sodium phosphate (FLEET) enema Place 1 enema rectally once. follow package directions give if resident has taken MOM for 2 days and has not had  a bowel movement    . tamsulosin (FLOMAX) 0.4 MG CAPS capsule Take 0.4 mg by mouth daily.    . Vitamins A & D (VITAMIN A & D) ointment Apply A&D ointment to bilateral feet and toes BID & PRN     No current facility-administered medications on file prior to visit.     Review of Systems   General no complaints of fever or chills.  Skin issues with the ear as noted above.Appear somewhat less painful today      Immunization History  Administered Date(s) Administered  . Influenza,inj,Quad PF,36+ Mos 12/07/2014  . Influenza-Unspecified 11/20/2013  . PPD Test 03/16/2013  . Pneumococcal-Unspecified 02/14/2009   Pertinent  Health Maintenance Due  Topic Date Due  . PNA vac Low Risk Adult (2 of 2 - PCV13) 02/15/2016 (Originally 02/14/2010)  . INFLUENZA VACCINE  09/15/2015   No flowsheet data found. Functional Status Survey:     Body mass index is 22.66 kg/(m^2). Physical Exam   In general this is a pleasant elderly male in no distress sitting in his wheelchair  He is less anxious appearing than yesterday.  Left ear continues to have erythema covering the majority of his left helix but this appears to be less inflamed appearing then yesterday-the area of skin biopsy also appears to be drier.  The ear appears to be less tender as well when palpated.     .     Labs reviewed:  Recent Labs  11/03/14 0700  04/28/15 1655 04/29/15 0448 06/10/15 06/10/15 0715  NA  --   < > 137 139 137 137  K  --   < > 4.8 3.9 4.3 4.3  CL  --   < > 100* 104  --  101  CO2  --   < > 28 28  --  28  GLUCOSE  --   < > 118* 88  --  92  BUN  --   < > 19 17  21  21*  CREATININE  --   < > 1.27* 1.18 1.1 1.11  CALCIUM  --   < > 8.8* 8.4*  --  8.9  MG 2.0  --   --   --   --   --   < > =  values in this interval not displayed.  Recent Labs  04/12/15 1535 04/28/15 1655 04/29/15 0448  AST 20 24 18   ALT 13* 15* 12*  ALKPHOS 105 94 77  BILITOT 0.7 0.7 0.7  PROT 8.0 7.2 6.3*  ALBUMIN 3.8 3.6 3.0*    Recent Labs  04/12/15 1535 04/25/15 0427 04/28/15 1655 04/29/15 0448 06/10/15 06/10/15 0715  WBC 14.1* 6.8 10.4 11.6* 6.9 6.9  NEUTROABS 11.6* 3.4 8.3*  --   --   --   HGB 11.9* 10.4* 11.7* 10.6*  --  11.9*  HCT 36.3* 32.2* 35.2* 31.8*  --  36.5*  MCV 86.8 88.7 86.9 87.6  --  86.7  PLT 310 189 232 209  --  224   No results found for: TSH Lab Results  Component Value Date   HGBA1C 6.1* 11/12/2014   No results found for: CHOL, HDL, LDLCALC, LDLDIRECT, TRIG, CHOLHDL  Significant Diagnostic Results in last 30 days:  No results found.  Assessment/Plan #1-0-left ear cellulitis-this appears to have stabilized he has been started on Bactrim will have to monitor this.  He is receiving Norco twice a day routinely for pain and this appears to be helping as well he appears to be less anxious today doing a bit better than yesterday again will await dermatology follow-up.  [id number]  Oralia Manis, [place]

## 2015-07-08 ENCOUNTER — Other Ambulatory Visit: Payer: Self-pay | Admitting: *Deleted

## 2015-07-08 ENCOUNTER — Non-Acute Institutional Stay (SKILLED_NURSING_FACILITY): Payer: Medicare Other | Admitting: Internal Medicine

## 2015-07-08 DIAGNOSIS — H61899 Other specified disorders of external ear, unspecified ear: Secondary | ICD-10-CM

## 2015-07-08 DIAGNOSIS — H9390 Unspecified disorder of ear, unspecified ear: Secondary | ICD-10-CM

## 2015-07-08 MED ORDER — HYDROCODONE-ACETAMINOPHEN 5-325 MG PO TABS: ORAL_TABLET | ORAL | Status: DC

## 2015-07-08 NOTE — Telephone Encounter (Signed)
[facility]  

## 2015-07-08 NOTE — Progress Notes (Deleted)
Location:      Place of Service:    Provider:  Imogene Burn, MD  Patient Care Team: Glenda Chroman, MD as PCP - General (Internal Medicine) Danie Binder, MD as Consulting Physician (Gastroenterology)  Extended Emergency Contact Information Primary Emergency Contact: Black,Shirley Address: [address]          [place], [place] [address] [name] of [place] Phone: [phone] Mobile Phone: [phone] Relation: Daughter  Code Status:  DNR Goals of care: Advanced Directive information Advanced Directives 07/07/2015  Does patient have an advance directive? Yes  Type of Advance Directive Out of facility DNR (pink MOST or yellow form)  Does patient want to make changes to advanced directive? No - Patient declined  Copy of advanced directive(s) in chart? Yes        HPI:  Pt is a 82 y.o. male seen today for an acute visit for follow-up of left ear issues as stated above-patient did have a suspicious looking area on the mid Helix  of his left ear- was removed by Dr. Nevada Crane dermatologist recently-and biopsied although I do not see the results of the biopsy so far.  Tapparently  had a topical antibiotic applied-but appears the Helix now has increased surrounding erythema and some drainage from the biopsy site.  Patient says she's having some increased discomfort at times with it as well.  He is not complaining of any change in hearing again he is baseline quite hard of hearing.  He is afebrile otherwise has no complaints of fever or chills   \  Past Medical History  Diagnosis Date  . Anxiety   . HTN (hypertension)   . Reflux   . Skin cancer, basal cell   . Diverticulitis   . Hyperlipidemia   . Inguinal hernia   . Ischemic heart disease   . Hiatal hernia   . Kidney stone   . Esophageal stricture   . History of recurrent TIAs   . Restless leg syndrome   . GERD (gastroesophageal reflux disease)   . Pleural effusion   . CHF (congestive heart failure)  ([facility])   . Urinary retention   . DNR (do not resuscitate)   . DJD (degenerative joint disease), cervical   . Osteoarthritis     bilat knees  . Gout   . Right hip pain   . Bilateral foot pain   . Esophageal dysmotility     age related per BPE 04/2015   Past Surgical History  Procedure Laterality Date  . Intestines    . Exploratory laparotomy w/ bowel resection    . Esophageal dilation    . Fracture surgery    . Hemiarthroplasty hip      Allergies  Allergen Reactions  . Celebrex [Celecoxib] Other (See Comments)    unknown  . Codeine Other (See Comments)    unknown    Current Outpatient Prescriptions on File Prior to Visit  Medication Sig Dispense Refill  . Amino Acids-Protein Hydrolys (FEEDING SUPPLEMENT, PRO-STAT SUGAR FREE 64,) LIQD Take 30 mLs by mouth 2 (two) times daily.    Marland Kitchen aspirin EC 81 MG tablet Take 81 mg by mouth daily.    . Cholecalciferol (VITAMIN D) 2000 UNITS CAPS Take 1 capsule by mouth daily.    Marland Kitchen docusate sodium (COLACE) 100 MG capsule Take 100 mg by mouth daily. For constipation.    . furosemide (LASIX) 20 MG tablet Take 20 mg by mouth every other day. For edema.    Marland Kitchen  HYDROcodone-acetaminophen (NORCO/VICODIN) 5-325 MG tablet Take one tablet by mouth twice daily and every 6 hours as needed for pain. Max APAP 3gm/24 hours from all sources 180 tablet 0  . magnesium hydroxide (MILK OF MAGNESIA) 400 MG/5ML suspension Take 30 mLs by mouth daily as needed for mild constipation. If no relief try a Fleets enema, after 2 days.    . Melatonin 3 MG CAPS Take 3 mg by mouth at bedtime as needed (for sleep).    . NON FORMULARY Magic Cup once a day    . omeprazole (PRILOSEC) 20 MG capsule Take 20 mg by mouth daily.    . OXYGEN Inhale 2 L into the lungs continuous.    . potassium chloride (K-DUR,KLOR-CON) 10 MEQ tablet Take 10 mEq by mouth every other day.     Marland Kitchen rOPINIRole (REQUIP) 3 MG tablet Take one tablet by mouth at bedtime    . sertraline (ZOLOFT) 50 MG tablet Take 50  mg by mouth at bedtime.     . sodium phosphate (FLEET) enema Place 1 enema rectally once. follow package directions give if resident has taken MOM for 2 days and has not had  a bowel movement    . tamsulosin (FLOMAX) 0.4 MG CAPS capsule Take 0.4 mg by mouth daily.    . Vitamins A & D (VITAMIN A & D) ointment Apply A&D ointment to bilateral feet and toes BID & PRN     No current facility-administered medications on file prior to visit.     Review of Systems   General no complaints of fever or chills.  Skin issues with the ear as noted above.  Head ears eyes nose mouth and throat does not clear visual changes has prescription lenses does not complaining of sore throat.  Respiratory denies shortness breath or cough. Cardiac no chest pain has somewhat mild lower extremity edema bilaterally.  GI does not complain of abdominal discomfort nausea vomiting diarrhea or constipation.  Muscle skeletal currently not complaining of joint pain again mainly concerned with ear discomfort at times.  Neurologic is not complaining of dizziness or headache.  Insight is somewhat anxious about his ear otherwise does not report issues  Immunization History  Administered Date(s) Administered  . Influenza,inj,Quad PF,36+ Mos 12/07/2014  . Influenza-Unspecified 11/20/2013  . PPD Test 03/16/2013  . Pneumococcal-Unspecified 02/14/2009   Pertinent  Health Maintenance Due  Topic Date Due  . PNA vac Low Risk Adult (2 of 2 - PCV13) 02/15/2016 (Originally 02/14/2010)  . INFLUENZA VACCINE  09/15/2015   No flowsheet data found. Functional Status Survey:    There were no vitals filed for this visit. There is no weight on file to calculate BMI. Physical Exam   In general this is a pleasant elderly male in no distress sitting in his wheelchair he is somewhat anxious about a however.  His skin is warm and dry on the helix of his left ear-biopsy site does appear to have a small amount of drainage with some  ulceration there is somewhat of a brownish 10 colored appearance to this-there is surrounding erythema covering approximately two thirds of the outer helix-there is some mild tenderness to palpation and warmth of the area.  Oropharynx is clear mucous membranes moist.  Chest is clear to auscultation there is no labored breathing.  Heart is regular rate and rhythm without murmur gallop or rub.  She has mild lower extremity edema this actually appears to be improved from what it was several months ago.  Abdomen  is soft nontender with positive bowel sounds he has a large right-sided hernia that appears to baseline also a well-healed surgical scar.  Muscle skeletal able move all extremities 4 lower extremity weakness which is baseline general frailty has protective boots to his feet.  Neurologic is grossly intact speech clear no lateralizing findings.  Psyche is largely alert and oriented pleasant and appropriate a little anxious this afternoon  Labs reviewed:  Recent Labs  11/03/14 0700  04/28/15 1655 04/29/15 0448 06/10/15 06/10/15 0715  NA  --   < > 137 139 137 137  K  --   < > 4.8 3.9 4.3 4.3  CL  --   < > 100* 104  --  101  CO2  --   < > 28 28  --  28  GLUCOSE  --   < > 118* 88  --  92  BUN  --   < > 19 17 21  21*  CREATININE  --   < > 1.27* 1.18 1.1 1.11  CALCIUM  --   < > 8.8* 8.4*  --  8.9  MG 2.0  --   --   --   --   --   < > = values in this interval not displayed.  Recent Labs  04/12/15 1535 04/28/15 1655 04/29/15 0448  AST 20 24 18   ALT 13* 15* 12*  ALKPHOS 105 94 77  BILITOT 0.7 0.7 0.7  PROT 8.0 7.2 6.3*  ALBUMIN 3.8 3.6 3.0*    Recent Labs  04/12/15 1535 04/25/15 0427 04/28/15 1655 04/29/15 0448 06/10/15 06/10/15 0715  WBC 14.1* 6.8 10.4 11.6* 6.9 6.9  NEUTROABS 11.6* 3.4 8.3*  --   --   --   HGB 11.9* 10.4* 11.7* 10.6*  --  11.9*  HCT 36.3* 32.2* 35.2* 31.8*  --  36.5*  MCV 86.8 88.7 86.9 87.6  --  86.7  PLT 310 189 232 209  --  224   No  results found for: TSH Lab Results  Component Value Date   HGBA1C 6.1* 11/12/2014   No results found for: CHOL, HDL, LDLCALC, LDLDIRECT, TRIG, CHOLHDL  Significant Diagnostic Results in last 30 days:  No results found.  Assessment/Plan #1-history of left ear issues with concerns for developing cellulitis-he will need expedient dermatology follow-up will see if he can get in to see Dr. Nevada Crane this week when he is in town.  We will start Bactrim DS twice a day for suspected cellulitis-I do note previously in the past he's had difficulty with a Cipro doxycycline combination which apparently has led to significant dysphagia nausea.  Also will update a metabolic panel later this week to make sure renal function is stable.  In regards to pain he is on Norco 5-3 25 mg every 6 hours when necessary we'll make this routine twice a day and every 6 hours when necessary to see if this will be moreive control for the ear discomfort he says the Norco does help feels he is not offered this often enough-again will monitor this.  [id number] note greater than 25 minutes spent assessing patient-discussing his status with nursing staff-reviewing his chart-and  coordinating and formulating a plan of care-of note greater than 50% of time spent coordinating plan of care with input as noted above-      Granville Lewis, PA-C [phone]

## 2015-07-09 DIAGNOSIS — Z08 Encounter for follow-up examination after completed treatment for malignant neoplasm: Secondary | ICD-10-CM | POA: Diagnosis not present

## 2015-07-09 DIAGNOSIS — Z85828 Personal history of other malignant neoplasm of skin: Secondary | ICD-10-CM | POA: Diagnosis not present

## 2015-07-10 ENCOUNTER — Encounter (HOSPITAL_COMMUNITY)
Admission: RE | Admit: 2015-07-10 | Discharge: 2015-07-10 | Disposition: A | Discharge status: Home / Self Care | Payer: Medicare Other | Source: Skilled Nursing Facility | Attending: Internal Medicine | Admitting: Internal Medicine

## 2015-07-10 DIAGNOSIS — R339 Retention of urine, unspecified: Secondary | ICD-10-CM | POA: Insufficient documentation

## 2015-07-10 DIAGNOSIS — X58XXXD Exposure to other specified factors, subsequent encounter: Secondary | ICD-10-CM | POA: Insufficient documentation

## 2015-07-10 DIAGNOSIS — I259 Chronic ischemic heart disease, unspecified: Secondary | ICD-10-CM | POA: Insufficient documentation

## 2015-07-10 DIAGNOSIS — M1000 Idiopathic gout, unspecified site: Secondary | ICD-10-CM | POA: Insufficient documentation

## 2015-07-10 DIAGNOSIS — S91109D Unspecified open wound of unspecified toe(s) without damage to nail, subsequent encounter: Secondary | ICD-10-CM | POA: Diagnosis not present

## 2015-07-10 DIAGNOSIS — I10 Essential (primary) hypertension: Secondary | ICD-10-CM | POA: Diagnosis not present

## 2015-07-10 LAB — BASIC METABOLIC PANEL
Anion gap: 5 (ref 5–15)
BUN: 19 mg/dL (ref 6–20)
CALCIUM: 8.5 mg/dL — AB (ref 8.9–10.3)
CO2: 28 mmol/L (ref 22–32)
CREATININE: 1.31 mg/dL — AB (ref 0.61–1.24)
Chloride: 102 mmol/L (ref 101–111)
GFR, EST AFRICAN AMERICAN: 50 mL/min — AB (ref >60)
GFR, EST NON AFRICAN AMERICAN: 43 mL/min — AB (ref >60)
Glucose, Bld: 89 mg/dL (ref 65–99)
Potassium: 4.3 mmol/L (ref 3.5–5.1)
Sodium: 135 mmol/L (ref 135–145)

## 2015-07-14 NOTE — Progress Notes (Signed)
Patient ID: Patrick [name], male   DOB: 1913-02-22, 82 y.o.   MRN: [id number]  Location:      Place of Service:    Provider:  Imogene Burn, MD  Patient Care Team: Patrick Chroman, MD as PCP - General (Internal Medicine) Patrick Binder, MD as Consulting Physician (Gastroenterology)  Extended Emergency Contact Information Primary Emergency Contact: Patrick [name] Address: [address]          [place], [place] [address] Patrick [name] of [place] Phone: [phone] Mobile Phone: [phone] Relation: Daughter  Code Status:  DNR Goals of care: Advanced Directive information Advanced Directives  07/07/2015   Does patient have an advance directive?  Yes   Type of Advance Directive  Out of facility DNR (pink MOST or yellow form)   Does patient want to make changes to advanced directive?  No - Patient declined   Copy of advanced directive(s) in chart?  Yes         HPI:   Pt is a 82 y.o. male seen today for an acute visit for follow-up of left ear issues as stated above-patient did have a suspicious looking area on the mid Helix  of his left ear- was removed by Dr. Nevada [name] dermatologist recently-and biopsied although I do not see the results of the biopsy so far.  Tapparently  had a topical antibiotic applied-but appears the Helix now has increased surrounding erythema and some drainage from the biopsy site.  Patient says she's having some increased discomfort at times with it as well.  He is not complaining of any change in hearing again he is baseline quite hard of hearing.  He is afebrile otherwise has no complaints of fever or chills   \    Past Medical History   Diagnosis  Date   .  Anxiety     .  HTN (hypertension)     .  Reflux     .  Skin cancer, basal cell     .  Diverticulitis     .  Hyperlipidemia     .  Inguinal hernia     .  Ischemic heart disease     .  Hiatal hernia     .  Kidney stone     .  Esophageal stricture     .  History of recurrent TIAs      .  Restless leg syndrome     .  GERD (gastroesophageal reflux disease)     .  Pleural effusion     .  CHF (congestive heart failure) ([place])     .  Urinary retention     .  DNR (do not resuscitate)     .  DJD (degenerative joint disease), cervical     .  Osteoarthritis         bilat knees   .  Gout     .  Right hip pain     .  Bilateral foot pain     .  Esophageal dysmotility         age related per BPE 04/2015    Past Surgical History   Procedure  Laterality  Date   .  Intestines       .  Exploratory laparotomy w/ bowel resection       .  Esophageal dilation       .  Fracture surgery       .  Hemiarthroplasty hip  Allergies   Allergen  Reactions   .  Celebrex [Celecoxib]  Other (See Comments)       unknown   .  Codeine  Other (See Comments)       unknown       Current Outpatient Prescriptions on File Prior to Visit   Medication  Sig  Dispense  Refill   .  Amino Acids-Protein Hydrolys (FEEDING SUPPLEMENT, PRO-STAT SUGAR FREE 64,) LIQD  Take 30 mLs by mouth 2 (two) times daily.       Marland Kitchen  aspirin EC 81 MG tablet  Take 81 mg by mouth daily.       .  Cholecalciferol (VITAMIN D) 2000 UNITS CAPS  Take 1 capsule by mouth daily.       Marland Kitchen  docusate sodium (COLACE) 100 MG capsule  Take 100 mg by mouth daily. For constipation.       .  furosemide (LASIX) 20 MG tablet  Take 20 mg by mouth every other day. For edema.       Marland Kitchen  HYDROcodone-acetaminophen (NORCO/VICODIN) 5-325 MG tablet  Take one tablet by mouth twice daily and every 6 hours as needed for pain. Max APAP 3gm/24 hours from all sources  180 tablet  0   .  magnesium hydroxide (MILK OF MAGNESIA) 400 MG/5ML suspension  Take 30 mLs by mouth daily as needed for mild constipation. If no relief try a Fleets enema, after 2 days.       .  Melatonin 3 MG CAPS  Take 3 mg by mouth at bedtime as needed (for sleep).       .  NON FORMULARY  Magic Cup once a day       .  omeprazole (PRILOSEC) 20 MG capsule  Take 20 mg by mouth daily.        .  OXYGEN  Inhale 2 L into the lungs continuous.       .  potassium chloride (K-DUR,KLOR-CON) 10 MEQ tablet  Take 10 mEq by mouth every other day.        Marland Kitchen  rOPINIRole (REQUIP) 3 MG tablet  Take one tablet by mouth at bedtime       .  sertraline (ZOLOFT) 50 MG tablet  Take 50 mg by mouth at bedtime.        .  sodium phosphate (FLEET) enema  Place 1 enema rectally once. follow package directions give if resident has taken MOM for 2 days and has not had  a bowel movement       .  tamsulosin (FLOMAX) 0.4 MG CAPS capsule  Take 0.4 mg by mouth daily.       .  Vitamins A & D (VITAMIN A & D) ointment  Apply A&D ointment to bilateral feet and toes BID & PRN          No current facility-administered medications on file prior to visit.      Review of Systems   General no complaints of fever or chills.  Skin issues with the ear as noted above.  Head ears eyes nose mouth and throat does not clear visual changes has prescription lenses does not complaining of sore throat.  Respiratory denies shortness breath or cough. Cardiac no chest pain has somewhat mild lower extremity edema bilaterally.  GI does not complain of abdominal discomfort nausea vomiting diarrhea or constipation.  Muscle skeletal currently not complaining of joint pain again mainly concerned with ear discomfort at times.  Neurologic is not  complaining of dizziness or headache.  Insight is somewhat anxious about his ear otherwise does not report issues    Immunization History   Administered  Date(s) Administered   .  Influenza,inj,Quad PF,36+ Mos  12/07/2014   .  Influenza-Unspecified  11/20/2013   .  PPD Test  03/16/2013   .  Pneumococcal-Unspecified  02/14/2009    Pertinent  Health Maintenance Due   Topic  Date Due   .  PNA vac Low Risk Adult (2 of 2 - PCV13)  02/15/2016 (Originally 02/14/2010)   .  INFLUENZA VACCINE   09/15/2015    No flowsheet data found. Functional Status Survey:    There were no vitals filed  for this visit. There is no weight on file to calculate BMI. Physical Exam   In general this is a pleasant elderly male in no distress sitting in his wheelchair he is somewhat anxious about a however.  His skin is warm and dry on the helix of his left ear-biopsy site does appear to have a small amount of drainage with some ulceration there is somewhat of a brownish 10 colored appearance to this-there is surrounding erythema covering approximately two thirds of the outer helix-there is some mild tenderness to palpation and warmth of the area.  Oropharynx is clear mucous membranes moist.  Chest is clear to auscultation there is no labored breathing.  Heart is regular rate and rhythm without murmur gallop or rub.  She has mild lower extremity edema this actually appears to be improved from what it was several months ago.  Abdomen is soft nontender with positive bowel sounds he has a large right-sided hernia that appears to baseline also a well-healed surgical scar.  Muscle skeletal able move all extremities 4 lower extremity weakness which is baseline general frailty has protective boots to his feet.  Neurologic is grossly intact speech clear no lateralizing findings.  Psyche is largely alert and oriented pleasant and appropriate a little anxious this afternoon  Labs reviewed:  Recent Labs (within last 365 days)     Recent Labs   11/03/14 0700    04/28/15 1655  04/29/15 0448  06/10/15  06/10/15 0715   NA   --    < >  137  139  137  137   K   --    < >  4.8  3.9  4.3  4.3   CL   --    < >  100*  104   --   101   CO2   --    < >  28  28   --   28   GLUCOSE   --    < >  118*  88   --   92   BUN   --    < >  19  17  21   21*   CREATININE   --    < >  1.27*  1.18  1.1  1.11   CALCIUM   --    < >  8.8*  8.4*   --   8.9   MG  2.0   --    --    --    --    --    < > = values in this interval not displayed.    Recent Labs (within last 365 days)     Recent Labs   04/12/15 1535   04/28/15 1655  04/29/15 0448   AST  20  24  18   ALT  13*  15*  12*   ALKPHOS  105  94  77   BILITOT  0.7  0.7  0.7   PROT  8.0  7.2  6.3*   ALBUMIN  3.8  3.6  3.0*       Recent Labs (within last 365 days)     Recent Labs   04/12/15 1535  04/25/15 0427  04/28/15 1655  04/29/15 0448  06/10/15  06/10/15 0715   WBC  14.1*  6.8  10.4  11.6*  6.9  6.9   NEUTROABS  11.6*  3.4  8.3*   --    --    --    HGB  11.9*  10.4*  11.7*  10.6*   --   11.9*   HCT  36.3*  32.2*  35.2*  31.8*   --   36.5*   MCV  86.8  88.7  86.9  87.6   --   86.7   PLT  310  189  232  209   --   224       Recent Labs    No results found for: TSH    Recent Labs    Lab Results   Component  Value  Date     HGBA1C  6.1*  11/12/2014       Recent Labs    No results found for: CHOL, HDL, LDLCALC, LDLDIRECT, TRIG, CHOLHDL    Significant Diagnostic Results in last 30 days:   Imaging Results    No results found.    Assessment/Plan #1-history of left ear issues with concerns for developing cellulitis-he will need expedient dermatology follow-up will see if he can get in to see Dr. Nevada [name] this week when he is in town.  We will start Bactrim DS twice a day for suspected cellulitis-I do note previously in the past he's had difficulty with a Cipro doxycycline combination which apparently has led to significant dysphagia nausea.  Also will update a metabolic panel later this week to make sure renal function is stable.  In regards to pain he is on Norco 5-3 25 mg every 6 hours when necessary we'll make this routine twice a day and every 6 hours when necessary to see if this will be moreive control for the ear discomfort he says the Norco does help feels he is not offered this often enough-again will monitor this.  [id number] note greater than 25 minutes spent assessing patient-discussing his status with nursing staff-reviewing his chart-and  coordinating and formulating a plan of care-of note greater than 50% of  time spent coordinating plan of care with input as noted above-      Granville Lewis, PA-C [phone]

## 2015-07-14 NOTE — Progress Notes (Signed)
Patient ID: Patrick [name], male   DOB: 1913-06-25, 82 y.o.   MRN: [id number] Wille Celeste, PA-C at 07/07/2015 10:31 AM       Status: Sign at close encounter        Expand All Collapse All    Location:  [facility] Room Number: 139/W Place of Service:  SNF (31) Provider:  Imogene Burn, MD  Patient Care Team: Glenda Chroman, MD as PCP - General (Internal Medicine) Danie Binder, MD as Consulting Physician (Gastroenterology)  Extended Emergency Contact Information Primary Emergency Contact: Black,Shirley Address: [address]          [place], [place] [address] [name] of [place] Phone: [phone] Mobile Phone: [phone] Relation: Daughter  Code Status:  DNR Goals of care: Advanced Directive information Advanced Directives  07/07/2015   Does patient have an advance directive?  Yes   Type of Advance Directive  Out of facility DNR (pink MOST or yellow form)   Does patient want to make changes to advanced directive?  No - Patient declined   Copy of advanced directive(s) in chart?  Yes     Chief complaint-acute visit follow-up left ear lesion   HPI:   Pt is a 82 y.o. male seen today for follow-up of left ear lesion.  I saw this yesterday M area appeared to be somewhat concerning for cellulitis with significant erythema of the left helix and appeared to have some moist changes to biopsy site.  The area was seen recently by dermatology secondary to r suspicious crusted lesion on the left helix-he is followed by Dr. Nevada Crane dermatologist.  We have ordered a follow-up with Dr. Nevada Crane to get his assessment of his ear-and this apparently has been arranged.  He was started on Bactrim yesterday secondary to concerns again of cellulitis.  The ear today does look somewhat improved the erythema persists but is less inflamed appearing the area actually appears drier as well-thiswill still certainly warrant follow up by dermatology .      \      Past Medical History   Diagnosis  Date   .  Anxiety     .  HTN (hypertension)     .  Reflux     .  Skin cancer, basal cell     .  Diverticulitis     .  Hyperlipidemia     .  Inguinal hernia     .  Ischemic heart disease     .  Hiatal hernia     .  Kidney stone     .  Esophageal stricture     .  History of recurrent TIAs     .  Restless leg syndrome     .  GERD (gastroesophageal reflux disease)     .  Pleural effusion     .  CHF (congestive heart failure) ([place])     .  Urinary retention     .  DNR (do not resuscitate)     .  DJD (degenerative joint disease), cervical     .  Osteoarthritis         bilat knees   .  Gout     .  Right hip pain     .  Bilateral foot pain     .  Esophageal dysmotility         age related per BPE 04/2015    Past Surgical History  Procedure  Laterality  Date   .  Intestines       .  Exploratory laparotomy w/ bowel resection       .  Esophageal dilation       .  Fracture surgery       .  Hemiarthroplasty hip           Allergies   Allergen  Reactions   .  Celebrex [Celecoxib]  Other (See Comments)       unknown   .  Codeine  Other (See Comments)       unknown       Current Outpatient Prescriptions on File Prior to Visit   Medication  Sig  Dispense  Refill   .  Amino Acids-Protein Hydrolys (FEEDING SUPPLEMENT, PRO-STAT SUGAR FREE 64,) LIQD  Take 30 mLs by mouth 2 (two) times daily.       Marland Kitchen  aspirin EC 81 MG tablet  Take 81 mg by mouth daily.       .  Cholecalciferol (VITAMIN D) 2000 UNITS CAPS  Take 1 capsule by mouth daily.       Marland Kitchen  docusate sodium (COLACE) 100 MG capsule  Take 100 mg by mouth daily. For constipation.       .  furosemide (LASIX) 20 MG tablet  Take 20 mg by mouth every other day. For edema.       Marland Kitchen  HYDROcodone-acetaminophen (NORCO/VICODIN) 5-325 MG tablet  Take 1 tablet by mouth every 6 (six) hours as needed for moderate pain.       .  magnesium hydroxide (MILK OF MAGNESIA) 400 MG/5ML suspension  Take 30 mLs by mouth daily  as needed for mild constipation. If no relief try a Fleets enema, after 2 days.       .  Melatonin 3 MG CAPS  Take 3 mg by mouth at bedtime as needed (for sleep).       .  NON FORMULARY  Magic Cup once a day       .  omeprazole (PRILOSEC) 20 MG capsule  Take 20 mg by mouth daily.       .  OXYGEN  Inhale 2 L into the lungs continuous.       .  potassium chloride (K-DUR,KLOR-CON) 10 MEQ tablet  Take 10 mEq by mouth every other day.        Marland Kitchen  rOPINIRole (REQUIP) 3 MG tablet  Take one tablet by mouth at bedtime       .  sertraline (ZOLOFT) 50 MG tablet  Take 50 mg by mouth at bedtime.        .  sodium phosphate (FLEET) enema  Place 1 enema rectally once. follow package directions give if resident has taken MOM for 2 days and has not had  a bowel movement       .  tamsulosin (FLOMAX) 0.4 MG CAPS capsule  Take 0.4 mg by mouth daily.       .  Vitamins A & D (VITAMIN A & D) ointment  Apply A&D ointment to bilateral feet and toes BID & PRN          No current facility-administered medications on file prior to visit.      Review of Systems   General no complaints of fever or chills.  Skin issues with the ear as noted above.Appear somewhat less painful today        Immunization History   Administered  Date(s) Administered   .  Influenza,inj,Quad PF,36+ Mos  12/07/2014   .  Influenza-Unspecified  11/20/2013   .  PPD Test  03/16/2013   .  Pneumococcal-Unspecified  02/14/2009    Pertinent  Health Maintenance Due   Topic  Date Due   .  PNA vac Low Risk Adult (2 of 2 - PCV13)  02/15/2016 (Originally 02/14/2010)   .  INFLUENZA VACCINE   09/15/2015    No flowsheet data found. Functional Status Survey:     Body mass index is 22.66 kg/(m^2). Physical Exam   In general this is a pleasant elderly male in no distress sitting in his wheelchair   He is less anxious appearing than yesterday.  Left ear continues to have erythema covering the majority of his left helix but this appears to be less  inflamed appearing then yesterday-the area of skin biopsy also appears to be drier.  The ear appears to be less tender as well when palpated.     .     Labs reviewed:  Recent Labs (within last 365 days)     Recent Labs   11/03/14 0700    04/28/15 1655  04/29/15 0448  06/10/15  06/10/15 0715   NA   --    < >  137  139  137  137   K   --    < >  4.8  3.9  4.3  4.3   CL   --    < >  100*  104   --   101   CO2   --    < >  28  28   --   28   GLUCOSE   --    < >  118*  88   --   92   BUN   --    < >  19  17  21   21*   CREATININE   --    < >  1.27*  1.18  1.1  1.11   CALCIUM   --    < >  8.8*  8.4*   --   8.9   MG  2.0   --    --    --    --    --    < > = values in this interval not displayed.    Recent Labs (within last 365 days)     Recent Labs   04/12/15 1535  04/28/15 1655  04/29/15 0448   AST  20  24  18    ALT  13*  15*  12*   ALKPHOS  105  94  77   BILITOT  0.7  0.7  0.7   PROT  8.0  7.2  6.3*   ALBUMIN  3.8  3.6  3.0*       Recent Labs (within last 365 days)     Recent Labs   04/12/15 1535  04/25/15 0427  04/28/15 1655  04/29/15 0448  06/10/15  06/10/15 0715   WBC  14.1*  6.8  10.4  11.6*  6.9  6.9   NEUTROABS  11.6*  3.4  8.3*   --    --    --    HGB  11.9*  10.4*  11.7*  10.6*   --   11.9*   HCT  36.3*  32.2*  35.2*  31.8*   --   36.5*   MCV  86.8  88.7  86.9  87.6   --  86.7   PLT  310  189  232  209   --   224       Recent Labs    No results found for: TSH    Recent Labs    Lab Results   Component  Value  Date     HGBA1C  6.1*  11/12/2014       Recent Labs    No results found for: CHOL, HDL, LDLCALC, LDLDIRECT, TRIG, CHOLHDL    Significant Diagnostic Results in last 30 days:   Imaging Results    No results found.    Assessment/Plan #1-0-left ear cellulitis-this appears to have stabilized he has been started on Bactrim will have to monitor this.  He is receiving Norco twice a day routinely for pain and this appears to be  helping as well he appears to be less anxious today doing a bit better than yesterday again will await dermatology follow-up.  [id number]

## 2015-07-16 DIAGNOSIS — Z85828 Personal history of other malignant neoplasm of skin: Secondary | ICD-10-CM | POA: Diagnosis not present

## 2015-07-16 DIAGNOSIS — Z08 Encounter for follow-up examination after completed treatment for malignant neoplasm: Secondary | ICD-10-CM | POA: Diagnosis not present

## 2015-08-03 ENCOUNTER — Encounter: Payer: Self-pay | Admitting: Internal Medicine

## 2015-08-03 ENCOUNTER — Non-Acute Institutional Stay (SKILLED_NURSING_FACILITY): Payer: Medicare Other | Admitting: Internal Medicine

## 2015-08-03 DIAGNOSIS — R1310 Dysphagia, unspecified: Secondary | ICD-10-CM | POA: Diagnosis not present

## 2015-08-03 DIAGNOSIS — Z8639 Personal history of other endocrine, nutritional and metabolic disease: Secondary | ICD-10-CM | POA: Diagnosis not present

## 2015-08-03 DIAGNOSIS — M1612 Unilateral primary osteoarthritis, left hip: Secondary | ICD-10-CM

## 2015-08-03 DIAGNOSIS — M1712 Unilateral primary osteoarthritis, left knee: Secondary | ICD-10-CM

## 2015-08-03 DIAGNOSIS — Z8739 Personal history of other diseases of the musculoskeletal system and connective tissue: Secondary | ICD-10-CM

## 2015-08-03 NOTE — Progress Notes (Signed)
This is a nursing facility follow up for specific acute issue of pain in L hip and L knee . Staff has requested an anti-inflammatory agent. Interim medical record and care since last [facility] visit was updated with review of diagnostic studies and change in clinical status since last visit were documented.  HPI: He describes intermittent left hip and left knee pain over the last 1-2 months without injury. This has been controlled with narcotic  pain medicine at bedtime. He takes this despite  PMH of codeine intolerance. It had been effective until the last 36 hours.  He describes a throbbing intermittent pain lasting minutes. He can be pain free for minutes.  He has a past history of hip replacement on the left. He has received intra-articular steroid injections in the left hip by Dr Aline Brochure  without benefit.  He has a history of intolerance or allergy as well to Celebrex. There is no delineation as to the specific reaction except nausea with codeine. As noted he has been taking hydrocodone without significant GI symptoms. He states he cannot remember the issue with Celebrex.. His past history also includes degenerative joint disease of the knees as well as gout. He has a history of reflux. Previously he's had some dysphagia but not when taking a PPI. He has exhibited mild anemia with  hgb as low as 10.6. His last hemoglobin was 11.9 on 06/10/15.  Comprehensive review of systems: He describes some constipation which responds to prune juice and milk of magnesia. Constitutional: No fever,significant weight change, fatigue  Cardiovascular: No chest pain, palpitations,paroxysmal nocturnal dyspnea, claudication, edema  Respiratory: No cough, sputum production,hemoptysis, DOE , significant snoring,apnea   Gastrointestinal: No heartburn,dysphagia,abdominal pain, nausea / vomiting,rectal bleeding, melena @ present Genitourinary: No dysuria,hematuria, pyuria,  incontinence,  nocturia Musculoskeletal: Hand joint stiffness w/o joint swelling reported.BUE weakness. Dermatologic: No rash, pruritus. Dr Nevada Crane is treating a lesion of the L ear. Neurologic: No dizziness,headache,syncope, seizures, numbness , tingling Endocrine: No change in hair/skin/ nails, excessive thirst, excessive hunger, excessive urination  Hematologic/lymphatic: No significant bruising, lymphadenopathy,abnormal bleeding   Physical exam:  Pertinent or positive findings: He appears younger than his stated age of 68. There slight asymmetry of the nasolabial folds. He is wearing an upper plate but not the lower  He is alert and oriented. He does have some hearing deficit. Surgical wound at the left superior ear is dressed. His heart sounds are somewhat distant and rhythm is slightly irregular. Breath sounds are decreased. There is diffuse irregular postoperative scarring over the midabdomen. Pedal pulses are decreased. He has trace pedal edema. Has mixed PIP and DIP changes. There is more pronounced in the PIP joints. He has some flexion and lateral deviation of the fingers. There is crepitus and fusiform enlargement of the left knee. He describes pain with range of motion. General appearance:Adequately nourished; no acute distress , increased work of breathing is present.   Lymphatic: No lymphadenopathy about the head, neck, axilla . Eyes: No conjunctival inflammation or lid edema is present. There is no scleral icterus. Nose:  External nasal examination shows no deformity or inflammation. Nasal mucosa are pink and moist without lesions ,exudates Oral exam: lips and gums are healthy appearing.There is no oropharyngeal erythema or exudate . Neck:  No thyromegaly, masses, tenderness noted.    Heart:  No gallop, murmur, click, rub .  Lungs:Chest clear to auscultation without wheezes, rhonchi,rales , rubs. Abdomen:Bowel sounds are normal. Abdomen is soft and nontender with no organomegaly,  hernias,masses.  GU: deferred . Foley in place. Extremities:  No cyanosis, clubbing,edema  Neurologic exam : Strength equal  in upper & lower extremities but decreased. Balance,Rhomberg,finger to nose testing could not be completed due to clinical state Skin: Warm & dry w/o tenting. Scattered keratoses  See summary under each active problem in the Problem List with associated updated therapeutic plan

## 2015-08-03 NOTE — Assessment & Plan Note (Signed)
Topical anti-inflammatory agents Oral anti-inflammatory agents relatively contraindicated due to history of GERD with previous history dysphagia and advanced age with increased risk of GI bleed

## 2015-08-03 NOTE — Assessment & Plan Note (Signed)
Oral anti-inflammatory agents contraindicated because of history of GERD with dysphagia and advanced age with increased risk of GI bleeding

## 2015-08-03 NOTE — Assessment & Plan Note (Signed)
See 619/17: Symptoms do not suggest acute gout. Uric acid will not be checked

## 2015-08-03 NOTE — Patient Instructions (Signed)
Topical Voltaren gel 2 g 4 times a day as needed to the left knee and left hip areas. Oral anti-inflammatory agents are contraindicated because of his history of GERD and dysphagia and advanced age with increased risk of GI bleeding with such.

## 2015-08-03 NOTE — Assessment & Plan Note (Signed)
See update of osteoarthritis left hip

## 2015-08-06 DIAGNOSIS — Z85828 Personal history of other malignant neoplasm of skin: Secondary | ICD-10-CM | POA: Diagnosis not present

## 2015-08-06 DIAGNOSIS — Z08 Encounter for follow-up examination after completed treatment for malignant neoplasm: Secondary | ICD-10-CM | POA: Diagnosis not present

## 2015-08-14 DIAGNOSIS — G4700 Insomnia, unspecified: Secondary | ICD-10-CM | POA: Diagnosis not present

## 2015-08-14 DIAGNOSIS — F329 Major depressive disorder, single episode, unspecified: Secondary | ICD-10-CM | POA: Diagnosis not present

## 2015-08-19 ENCOUNTER — Non-Acute Institutional Stay (SKILLED_NURSING_FACILITY): Payer: Medicare Other | Admitting: Internal Medicine

## 2015-08-19 ENCOUNTER — Encounter: Payer: Self-pay | Admitting: Internal Medicine

## 2015-08-19 DIAGNOSIS — M1612 Unilateral primary osteoarthritis, left hip: Secondary | ICD-10-CM | POA: Diagnosis not present

## 2015-08-19 DIAGNOSIS — I5030 Unspecified diastolic (congestive) heart failure: Secondary | ICD-10-CM

## 2015-08-19 DIAGNOSIS — R21 Rash and other nonspecific skin eruption: Secondary | ICD-10-CM

## 2015-08-19 NOTE — Progress Notes (Signed)
Location:    [facility] Room Number: 139/W Place of Service:  SNF [id number]) Provider:  Imogene Burn, MD  Patient Care Team: Glenda Chroman, MD as PCP - General (Internal Medicine) Danie Binder, MD as Consulting Physician (Gastroenterology)  Extended Emergency Contact Information Primary Emergency Contact: Black,Shirley Address: [address]          [place], [place] [address] [name] of [place] Phone: [phone] Mobile Phone: [phone] Relation: Daughter  Code Status:  DNR Goals of care: Advanced Directive information Advanced Directives 08/19/2015  Does patient have an advance directive? Yes  Type of Advance Directive Out of facility DNR (pink MOST or yellow form)  Does patient want to make changes to advanced directive? No - Patient declined  Copy of advanced directive(s) in chart? Yes     Chief Complaint  Patient presents with  . Acute Visit    Rash  Follow-up left knee pain-weight gain  HPI:  Pt is a 82 y.o. male  who is seen today for a rash-apparently this is confined to his anterior lower neck area. He states is been going on for a couple weeks-she's put on Molly cream on it apparently with no relief-he does not complain of any rashes anywhere else-he says the rash does itch.  In regards to other issues he appears to be stable.  He has complained of some left hip and knee pain x-rays have shown arthritic changes-he is on Norco when necessary Dr. Linna Darner did assess this as well and started him on Voltaren gel topical and apparently this is helping.  I also note he is gained about 10 pounds in the past 2 months-she does have a history of CHF she is on Lasix every other day 20 mg.  His edema actually appears to be quite stable and quite minimal at this point he does not complain of any shortness of breath-he did have a poor appetite for a while status post hospitalization for UTI as well as dysphagia-it was thought this  possibly was caused by a combination of doxycycline and ciprofloxacin.  Recommendation was for dysphagia 2 diet with thin liquids-.  I suspect the weight gain is secondary to improve appetite and resolution of dysphagia  Currently he is sitting in his wheelchair comfortably he just celebrated his 11-th birthday and appears to be doing quite well   Past Medical History  Diagnosis Date  . Anxiety   . HTN (hypertension)   . Reflux   . Skin cancer, basal cell   . Diverticulitis   . Hyperlipidemia   . Inguinal hernia   . Ischemic heart disease   . Hiatal hernia   . Kidney stone   . Esophageal stricture   . History of recurrent TIAs   . Restless leg syndrome   . GERD (gastroesophageal reflux disease)   . Pleural effusion   . CHF (congestive heart failure) ([facility])   . Urinary retention   . DNR (do not resuscitate)   . DJD (degenerative joint disease), cervical   . Osteoarthritis     bilat knees  . Gout   . Right hip pain   . Bilateral foot pain   . Esophageal dysmotility     age related per BPE 04/2015   Past Surgical History  Procedure Laterality Date  . Intestines    . Exploratory laparotomy w/ bowel resection    . Esophageal dilation    . Fracture surgery    .  Hemiarthroplasty hip      Dr. Aline Brochure    Allergies  Allergen Reactions  . Celebrex [Celecoxib] Other (See Comments)    Unknown; patient can't remember  . Codeine Other (See Comments)    nausea    Current Outpatient Prescriptions on File Prior to Visit  Medication Sig Dispense Refill  . Amino Acids-Protein Hydrolys (FEEDING SUPPLEMENT, PRO-STAT SUGAR FREE 64,) LIQD Take 30 mLs by mouth 2 (two) times daily.    Marland Kitchen aspirin EC 81 MG tablet Take 81 mg by mouth daily.    . Cholecalciferol (VITAMIN D) 2000 UNITS CAPS Take 1 capsule by mouth daily.    . diclofenac sodium (VOLTAREN) 1 % GEL Apply 2 grams to left hip and left knee q 6 hours PRN for pain    . docusate sodium (COLACE) 100 MG capsule Take 100 mg by  mouth daily. For constipation.    . furosemide (LASIX) 20 MG tablet Take 20 mg by mouth every other day. For edema.    Marland Kitchen HYDROcodone-acetaminophen (NORCO/VICODIN) 5-325 MG tablet Take one tablet by mouth twice daily and every 6 hours as needed for pain. Max APAP 3gm/24 hours from all sources 180 tablet 0  . magnesium hydroxide (MILK OF MAGNESIA) 400 MG/5ML suspension Take 30 mLs by mouth daily as needed for mild constipation. If no relief try a Fleets enema, after 2 days.    . Melatonin 3 MG CAPS Take 3 mg by mouth at bedtime as needed (for sleep).    . mupirocin cream (BACTROBAN) 2 % Apply to left ear per direction at bedtime    . NON FORMULARY Magic Cup once a day    . omeprazole (PRILOSEC) 20 MG capsule Take 20 mg by mouth daily.    . potassium chloride (K-DUR,KLOR-CON) 10 MEQ tablet Take 10 mEq by mouth every other day.     Marland Kitchen rOPINIRole (REQUIP) 3 MG tablet Take one tablet by mouth at bedtime    . sertraline (ZOLOFT) 50 MG tablet Take 50 mg by mouth at bedtime.     . sodium phosphate (FLEET) enema Place 1 enema rectally once. follow package directions give if resident has taken MOM for 2 days and has not had  a bowel movement    . tamsulosin (FLOMAX) 0.4 MG CAPS capsule Take 0.4 mg by mouth daily.    . Vitamins A & D (VITAMIN A & D) ointment Apply A&D ointment to bilateral feet and toes BID & PRN     No current facility-administered medications on file prior to visit.     Review of Systems   General no complaints of fever or chills.  Skin does complain of any itchy rash anterior neck area-also has left ear lesion that is followed by dermatology.  Ear nose mouth and throat does not complaining of any visual changes or sore throat dysphagia apparently has improved.  Respirator does not complain of shortness breath or cough.  Cardiac no chest pain minimal lower extremity edema.  GU has an indwelling Foley catheter does not complain of dysuria.  GI is not complaining of abdominal  pain nausea vomiting diarrhea or constipation.  Muscle skeletal has complained of some left hip and knee pain in the past but this apparently has improved with Voltaren gel.  Neurologic is not complaining of dizziness headache or numbness currently.  Psych does have a history of depression this is been quite stable for some time appears to be in good spirits quite interactive conversant  Immunization History  Administered Date(s) Administered  . Influenza,inj,Quad PF,36+ Mos 12/07/2014  . Influenza-Unspecified 11/20/2013  . PPD Test 03/16/2013  . Pneumococcal-Unspecified 02/14/2009   Pertinent  Health Maintenance Due  Topic Date Due  . PNA vac Low Risk Adult (2 of 2 - PCV13) 02/15/2016 (Originally 02/14/2010)  . INFLUENZA VACCINE  09/15/2015   No flowsheet data found. Functional Status Survey:    Filed Vitals:   08/19/15 1540  BP: 116/68  Pulse: 98  Temp: 97.9 F (36.6 C)  TempSrc: Oral  Resp: 20  Height: 5\' 11"  (1.803 m)  Weight: 162 lb 9.6 oz (73.755 kg)  SpO2: 96%   Body mass index is 22.69 kg/(m^2). Physical Exam   Gen. this is a pleasant elderly male in no distress sitting cup of venous wheelchair.  Skin is warm and I does have numerous solar induced changes diffuse again with the left ear lesion which is followed by dermatology.  Around his  Anterior neck there is a somewhat splotchy linear almost necklace type appearance macular papular rash there is no drainage or bleeding this does not appear to be cellulitic  Oropharynx clear mucous membranes moist.  Chest is clear to auscultation with shallow air entry no labored breathing.  Heart sounds are somewhat distant largely regular rate and rhythm with occasional irregular beats he has minimal trace lower extremity edema.  Abdomen is protuberant soft nontender with a well-healed surgical scar and a right sided hernia appears stable.  GU continues with indwelling Foley catheter draining amber colored  urine.  Muscle skeletal general frailty is able to walk extremities 4 limited range of motion with arthritic changes of his lower extremities knee area-do not note any acute deformities.  His feet actually appeared to be improved from previous exams with less edema I do not see any evidence of erythema he does have arthritic changes of his toes.  Neurologic is grossly intact to speech is clear no lateralizing findings.  Psych continues to be largely alert and oriented pleasant and appropriate  Labs reviewed:  Recent Labs  11/03/14 0700  04/29/15 0448 06/10/15 06/10/15 0715 07/10/15 0844  NA  --   < > 139 137 137 135  K  --   < > 3.9 4.3 4.3 4.3  CL  --   < > 104  --  101 102  CO2  --   < > 28  --  28 28  GLUCOSE  --   < > 88  --  92 89  BUN  --   < > 17 21 21* 19  CREATININE  --   < > 1.18 1.1 1.11 1.31*  CALCIUM  --   < > 8.4*  --  8.9 8.5*  MG 2.0  --   --   --   --   --   < > = values in this interval not displayed.  Recent Labs  04/12/15 1535 04/28/15 1655 04/29/15 0448  AST 20 24 18   ALT 13* 15* 12*  ALKPHOS 105 94 77  BILITOT 0.7 0.7 0.7  PROT 8.0 7.2 6.3*  ALBUMIN 3.8 3.6 3.0*    Recent Labs  04/12/15 1535 04/25/15 0427 04/28/15 1655 04/29/15 0448 06/10/15 06/10/15 0715  WBC 14.1* 6.8 10.4 11.6* 6.9 6.9  NEUTROABS 11.6* 3.4 8.3*  --   --   --   HGB 11.9* 10.4* 11.7* 10.6*  --  11.9*  HCT 36.3* 32.2* 35.2* 31.8*  --  36.5*  MCV 86.8 88.7 86.9 87.6  --  86.7  PLT 310 189 232 209  --  224   No results found for: TSH Lab Results  Component Value Date   HGBA1C 6.1* 11/12/2014   No results found for: CHOL, HDL, LDLCALC, LDLDIRECT, TRIG, CHOLHDL  Significant Diagnostic Results in last 30 days:  No results found.  Assessment/Plan . #1-rash unspecified-will treat initially with steroid cream triamcinolone 0.1% twice a day if no resolution certainly will have to readdress I do not see any significant rashes elsewhere per body assessment.  #2 history  CHF he is on Lasix 20 mg every other day and has had some weight gain I suspect this is due to  a better appetite however he is not complaining of dysphagia today which is encouraging-at this point monitor.  Since he is on Lasix with potassium every other day will update a metabolic panel he does have some mild renal insufficiency with a creatinine of 1.31 on most recent lab on May 26 this will warrant updating.  #3 history of left hip knee discomfort this appears to be stabilized on the Voltaren gel he also continues on Norco as needed.  #4 history of anemia suspect this is chronic disease with last hemoglobin 11.9 on 06/10/2015 will update this as well--this has shown stability  [id number]     Oralia Manis, [place]

## 2015-08-20 ENCOUNTER — Encounter: Payer: Self-pay | Admitting: Internal Medicine

## 2015-08-20 ENCOUNTER — Encounter (HOSPITAL_COMMUNITY)
Admission: RE | Admit: 2015-08-20 | Discharge: 2015-08-20 | Disposition: A | Discharge status: Home / Self Care | Payer: Medicare Other | Source: Skilled Nursing Facility | Attending: Internal Medicine | Admitting: Internal Medicine

## 2015-08-20 DIAGNOSIS — M1000 Idiopathic gout, unspecified site: Secondary | ICD-10-CM | POA: Diagnosis not present

## 2015-08-20 DIAGNOSIS — I259 Chronic ischemic heart disease, unspecified: Secondary | ICD-10-CM | POA: Diagnosis not present

## 2015-08-20 DIAGNOSIS — S01302D Unspecified open wound of left ear, subsequent encounter: Secondary | ICD-10-CM | POA: Insufficient documentation

## 2015-08-20 DIAGNOSIS — I10 Essential (primary) hypertension: Secondary | ICD-10-CM | POA: Diagnosis not present

## 2015-08-20 DIAGNOSIS — M869 Osteomyelitis, unspecified: Secondary | ICD-10-CM | POA: Diagnosis not present

## 2015-08-20 DIAGNOSIS — R339 Retention of urine, unspecified: Secondary | ICD-10-CM | POA: Diagnosis not present

## 2015-08-20 DIAGNOSIS — D649 Anemia, unspecified: Secondary | ICD-10-CM | POA: Insufficient documentation

## 2015-08-20 LAB — BASIC METABOLIC PANEL
Anion gap: 4 — ABNORMAL LOW (ref 5–15)
BUN: 21 mg/dL — AB (ref 6–20)
CALCIUM: 8.4 mg/dL — AB (ref 8.9–10.3)
CHLORIDE: 103 mmol/L (ref 101–111)
CO2: 30 mmol/L (ref 22–32)
Creatinine, Ser: 1.1 mg/dL (ref 0.61–1.24)
GFR, EST NON AFRICAN AMERICAN: 53 mL/min — AB (ref >60)
GLUCOSE: 97 mg/dL (ref 65–99)
Potassium: 4.6 mmol/L (ref 3.5–5.1)
Sodium: 137 mmol/L (ref 135–145)

## 2015-08-20 LAB — CBC
HEMATOCRIT: 32.8 % — AB (ref 39.0–52.0)
HEMOGLOBIN: 10.4 g/dL — AB (ref 13.0–17.0)
MCH: 27.9 pg (ref 26.0–34.0)
MCHC: 31.7 g/dL (ref 30.0–36.0)
MCV: 87.9 fL (ref 78.0–100.0)
Platelets: 183 10*3/uL (ref 150–400)
RBC: 3.73 MIL/uL — ABNORMAL LOW (ref 4.22–5.81)
RDW: 14.4 % (ref 11.5–15.5)
WBC: 7.2 10*3/uL (ref 4.0–10.5)

## 2015-08-24 ENCOUNTER — Encounter (HOSPITAL_COMMUNITY)
Admission: RE | Admit: 2015-08-24 | Discharge: 2015-08-24 | Disposition: A | Discharge status: Home / Self Care | Payer: Medicare Other | Source: Skilled Nursing Facility | Attending: Internal Medicine | Admitting: Internal Medicine

## 2015-08-24 DIAGNOSIS — M79671 Pain in right foot: Secondary | ICD-10-CM | POA: Diagnosis not present

## 2015-08-24 DIAGNOSIS — M79672 Pain in left foot: Secondary | ICD-10-CM | POA: Diagnosis not present

## 2015-08-24 DIAGNOSIS — M869 Osteomyelitis, unspecified: Secondary | ICD-10-CM | POA: Diagnosis not present

## 2015-08-24 DIAGNOSIS — R339 Retention of urine, unspecified: Secondary | ICD-10-CM | POA: Diagnosis not present

## 2015-08-24 DIAGNOSIS — S01302D Unspecified open wound of left ear, subsequent encounter: Secondary | ICD-10-CM | POA: Diagnosis not present

## 2015-08-24 DIAGNOSIS — I259 Chronic ischemic heart disease, unspecified: Secondary | ICD-10-CM | POA: Diagnosis not present

## 2015-08-24 DIAGNOSIS — L84 Corns and callosities: Secondary | ICD-10-CM | POA: Diagnosis not present

## 2015-08-24 DIAGNOSIS — I10 Essential (primary) hypertension: Secondary | ICD-10-CM | POA: Diagnosis not present

## 2015-08-24 DIAGNOSIS — M1000 Idiopathic gout, unspecified site: Secondary | ICD-10-CM | POA: Diagnosis not present

## 2015-08-24 DIAGNOSIS — B351 Tinea unguium: Secondary | ICD-10-CM | POA: Diagnosis not present

## 2015-08-24 LAB — CBC
HCT: 30.9 % — ABNORMAL LOW (ref 39.0–52.0)
Hemoglobin: 10 g/dL — ABNORMAL LOW (ref 13.0–17.0)
MCH: 28.2 pg (ref 26.0–34.0)
MCHC: 32.4 g/dL (ref 30.0–36.0)
MCV: 87 fL (ref 78.0–100.0)
PLATELETS: 157 10*3/uL (ref 150–400)
RBC: 3.55 MIL/uL — AB (ref 4.22–5.81)
RDW: 14.4 % (ref 11.5–15.5)
WBC: 8.4 10*3/uL (ref 4.0–10.5)

## 2015-08-24 LAB — VITAMIN B12: Vitamin B-12: 320 pg/mL (ref 180–914)

## 2015-08-24 LAB — FERRITIN: FERRITIN: 17 ng/mL — AB (ref 24–336)

## 2015-08-25 ENCOUNTER — Encounter: Payer: Self-pay | Admitting: Internal Medicine

## 2015-08-25 ENCOUNTER — Non-Acute Institutional Stay (SKILLED_NURSING_FACILITY): Payer: Medicare Other | Admitting: Internal Medicine

## 2015-08-25 DIAGNOSIS — D649 Anemia, unspecified: Secondary | ICD-10-CM | POA: Diagnosis not present

## 2015-08-25 NOTE — Patient Instructions (Signed)
Please check stool cards each day he has a bowel movement 3 total.

## 2015-08-25 NOTE — Assessment & Plan Note (Signed)
Anemia with low iron levels is usually from gastrointestinal blood loss; although he has no active GI symptoms despite a history of ulcers and ruptured diverticulum.  Stool cards will be completed At 100  aggressive intervention is not indicated unless there is evidence of acute GI bleed

## 2015-08-25 NOTE — Progress Notes (Signed)
    Facility Location: [facility] Room Number: 139/W    This is a nursing facility follow up for specific acute issue of anemia . Interim medical record and care since last [facility] visit was updated with review of diagnostic studies and change in clinical status since last visit were documented.  HPI: He has a slowly progressive anemia: Hematocrit has dropped from 32.8 on 7/6 to 30.9. His ferritin is markedly low at 17 indicating severe iron storage deficiency. B12 levels are normal. He has a past history of ulcers. He also had a ruptured diverticulum and had part of his large colon removed.  He does have easy bruising but no other bleeding dyscrasias. Other than constipation he denies any active GI symptoms. He remains on Prilosec.  Review of systems:Epistaxis, hemoptysis, hematuria, melena, or rectal bleeding denied. No unexplained weight loss, significant dyspepsia,dysphagia, or abdominal pain.  There is no abnormal bleeding, or difficulty stopping bleeding with injury.  Physical exam:  Pertinent or positive findings include: He appears his stated age. Heart sounds are distant. Abdomen is protuberant and doughy. There is a midline well-healed operative scar. Pedal pulses are decreased. He has mixed arthritic change in the hands. Scattered keratotic lesions are present. There is a dressing over the left ear.  General appearance :adequately nourished; in no distress. Eyes: No conjunctival inflammation or scleral icterus is present. Heart:  No gallop, murmur, click, rub or other extra sounds   Lungs:Chest clear to auscultation; no wheezes, rhonchi,rales ,or rubs present.No increased work of breathing.  Abdomen: bowel sounds normal, soft and non-tender without masses, organomegaly or hernias noted.  No guarding or rebound.  Vascular : all pulses equal ; no bruits present. Skin:Warm & dry.  Intact without suspicious lesions or rashes ; no tenting or jaundice    Lymphatic: No lymphadenopathy is noted about the head, neck, axilla.  Neuro: Strength, tone decreased.  See summary under each active problem in the Problem List with associated updated therapeutic plan

## 2015-08-26 ENCOUNTER — Other Ambulatory Visit (HOSPITAL_COMMUNITY)
Admission: RE | Admit: 2015-08-26 | Discharge: 2015-08-26 | Disposition: A | Discharge status: Home / Self Care | Payer: Medicare Other | Source: Skilled Nursing Facility | Attending: Internal Medicine | Admitting: Internal Medicine

## 2015-08-26 ENCOUNTER — Other Ambulatory Visit: Payer: Self-pay

## 2015-08-26 DIAGNOSIS — R195 Other fecal abnormalities: Secondary | ICD-10-CM | POA: Diagnosis not present

## 2015-08-26 LAB — OCCULT BLOOD X 1 CARD TO LAB, STOOL: Fecal Occult Bld: NEGATIVE

## 2015-08-26 MED ORDER — HYDROCODONE-ACETAMINOPHEN 5-325 MG PO TABS: ORAL_TABLET | ORAL | Status: DC

## 2015-08-29 ENCOUNTER — Encounter (HOSPITAL_COMMUNITY)
Admission: AD | Admit: 2015-08-29 | Discharge: 2015-08-29 | Disposition: A | Discharge status: Home / Self Care | Payer: Medicare Other | Source: Skilled Nursing Facility | Attending: Internal Medicine | Admitting: Internal Medicine

## 2015-08-29 DIAGNOSIS — S01302D Unspecified open wound of left ear, subsequent encounter: Secondary | ICD-10-CM | POA: Diagnosis not present

## 2015-08-29 DIAGNOSIS — I259 Chronic ischemic heart disease, unspecified: Secondary | ICD-10-CM | POA: Diagnosis not present

## 2015-08-29 DIAGNOSIS — R339 Retention of urine, unspecified: Secondary | ICD-10-CM | POA: Diagnosis not present

## 2015-08-29 DIAGNOSIS — M869 Osteomyelitis, unspecified: Secondary | ICD-10-CM | POA: Diagnosis not present

## 2015-08-29 DIAGNOSIS — M1000 Idiopathic gout, unspecified site: Secondary | ICD-10-CM | POA: Diagnosis not present

## 2015-08-29 DIAGNOSIS — I10 Essential (primary) hypertension: Secondary | ICD-10-CM | POA: Diagnosis not present

## 2015-08-29 LAB — OCCULT BLOOD X 1 CARD TO LAB, STOOL: FECAL OCCULT BLD: NEGATIVE

## 2015-09-02 ENCOUNTER — Other Ambulatory Visit (HOSPITAL_COMMUNITY)
Admission: RE | Admit: 2015-09-02 | Discharge: 2015-09-02 | Disposition: A | Discharge status: Home / Self Care | Payer: Medicare Other | Source: Skilled Nursing Facility | Attending: Internal Medicine | Admitting: Internal Medicine

## 2015-09-02 DIAGNOSIS — I259 Chronic ischemic heart disease, unspecified: Secondary | ICD-10-CM | POA: Diagnosis not present

## 2015-09-02 LAB — OCCULT BLOOD X 1 CARD TO LAB, STOOL: Fecal Occult Bld: NEGATIVE

## 2015-09-14 ENCOUNTER — Encounter: Payer: Self-pay | Admitting: Internal Medicine

## 2015-09-14 ENCOUNTER — Other Ambulatory Visit (HOSPITAL_COMMUNITY)
Admission: RE | Admit: 2015-09-14 | Discharge: 2015-09-14 | Disposition: A | Discharge status: Home / Self Care | Payer: Medicare Other | Source: Skilled Nursing Facility | Attending: Internal Medicine | Admitting: Internal Medicine

## 2015-09-14 ENCOUNTER — Non-Acute Institutional Stay (SKILLED_NURSING_FACILITY): Payer: Medicare Other | Admitting: Internal Medicine

## 2015-09-14 DIAGNOSIS — I259 Chronic ischemic heart disease, unspecified: Secondary | ICD-10-CM | POA: Diagnosis not present

## 2015-09-14 DIAGNOSIS — R42 Dizziness and giddiness: Secondary | ICD-10-CM

## 2015-09-14 LAB — CBC WITH DIFFERENTIAL/PLATELET
BASOS ABS: 0 10*3/uL (ref 0.0–0.1)
BASOS PCT: 0 %
EOS ABS: 0.4 10*3/uL (ref 0.0–0.7)
Eosinophils Relative: 4 %
HEMATOCRIT: 37 % — AB (ref 39.0–52.0)
HEMOGLOBIN: 11.9 g/dL — AB (ref 13.0–17.0)
Lymphocytes Relative: 15 %
Lymphs Abs: 1.6 10*3/uL (ref 0.7–4.0)
MCH: 28.4 pg (ref 26.0–34.0)
MCHC: 32.2 g/dL (ref 30.0–36.0)
MCV: 88.3 fL (ref 78.0–100.0)
Monocytes Absolute: 1.4 10*3/uL — ABNORMAL HIGH (ref 0.1–1.0)
Monocytes Relative: 14 %
NEUTROS ABS: 7.1 10*3/uL (ref 1.7–7.7)
NEUTROS PCT: 67 %
Platelets: 217 10*3/uL (ref 150–400)
RBC: 4.19 MIL/uL — AB (ref 4.22–5.81)
RDW: 14.7 % (ref 11.5–15.5)
WBC: 10.6 10*3/uL — AB (ref 4.0–10.5)

## 2015-09-14 LAB — BASIC METABOLIC PANEL
ANION GAP: 9 (ref 5–15)
BUN: 26 mg/dL — ABNORMAL HIGH (ref 6–20)
CHLORIDE: 102 mmol/L (ref 101–111)
CO2: 26 mmol/L (ref 22–32)
CREATININE: 1.24 mg/dL (ref 0.61–1.24)
Calcium: 9 mg/dL (ref 8.9–10.3)
GFR calc non Af Amer: 46 mL/min — ABNORMAL LOW (ref >60)
GFR, EST AFRICAN AMERICAN: 53 mL/min — AB (ref >60)
Glucose, Bld: 115 mg/dL — ABNORMAL HIGH (ref 65–99)
POTASSIUM: 4.3 mmol/L (ref 3.5–5.1)
SODIUM: 137 mmol/L (ref 135–145)

## 2015-09-14 LAB — URINE MICROSCOPIC-ADD ON

## 2015-09-14 LAB — URINALYSIS, ROUTINE W REFLEX MICROSCOPIC
BILIRUBIN URINE: NEGATIVE
Glucose, UA: NEGATIVE mg/dL
NITRITE: NEGATIVE
SPECIFIC GRAVITY, URINE: 1.015 (ref 1.005–1.030)
pH: 5.5 (ref 5.0–8.0)

## 2015-09-14 NOTE — Progress Notes (Signed)
Location:   Nursing Home   Place of Service:   Nursing home    PCP: Unice Cobble, MD Patient Care Team: Hendricks Limes, MD as PCP - General (Internal Medicine) Danie Binder, MD as Consulting Physician (Gastroenterology)  Extended Emergency Contact Information Primary Emergency Contact: Black,Shirley Address: [address]          [place], [place] [address] [name] of [place] Phone: [phone] Mobile Phone: [phone] Relation: Daughter   Goals of care:  Advanced Directive information Advanced Directives 08/25/2015  Does patient have an advance directive? Yes  Type of Advance Directive Out of facility DNR (pink MOST or yellow form)  Does patient want to make changes to advanced directive? No - Patient declined  Copy of advanced directive(s) in chart? Yes  Pre-existing out of facility DNR order (yellow form or pink MOST form) -     Allergies  Allergen Reactions  . Celebrex [Celecoxib] Other (See Comments)    Unknown; patient can't remember  . Codeine Other (See Comments)    nausea    No chief complaint on file.   HPI:  82 y.o. male   Patient had episode of Dizziness this morning when the Nurse was giving him his bath. It seems like he passed out for few seconds and then was fine. Patient not c/o any chest pain or SOB. No signs of any seizures.  He says he still feel little weak and tired. He is also C/O some numbness in his both legs. No H/o Fever, cough , or chest pain. Patient has catheter in place for urinary retention. The niece has noticed some cloudiness of urine.   Past Medical History:  Diagnosis Date  . Anxiety   . Bilateral foot pain   . CHF (congestive heart failure) ([facility])   . Diverticulitis   . DJD (degenerative joint disease), cervical   . DNR (do not resuscitate)   . Esophageal dysmotility    age related per BPE 04/2015  . Esophageal stricture   . GERD (gastroesophageal reflux disease)   . Gout   . Hiatal hernia   . History of  recurrent TIAs   . HTN (hypertension)   . Hyperlipidemia   . Inguinal hernia   . Ischemic heart disease   . Kidney stone   . Osteoarthritis    bilat knees  . Pleural effusion   . Reflux   . Restless leg syndrome   . Right hip pain   . Skin cancer, basal cell   . Urinary retention     Past Surgical History:  Procedure Laterality Date  . ESOPHAGEAL DILATION    . EXPLORATORY LAPAROTOMY W/ BOWEL RESECTION    . FRACTURE SURGERY    . HEMIARTHROPLASTY HIP     Dr. Aline Brochure  . intestines        reports that he has never smoked. He has never used smokeless tobacco. He reports that he does not drink alcohol or use drugs. Social History   Social History  . Marital status: Married    Spouse name: N/A  . Number of children: N/A  . Years of education: na   Occupational History  . retired    Social History Main Topics  . Smoking status: Never Smoker  . Smokeless tobacco: Never Used  . Alcohol use No  . Drug use: No  . Sexual activity: Not on file   Other Topics Concern  . Not on file   Social History Narrative  . No narrative  on file   Functional Status Survey:    Allergies  Allergen Reactions  . Celebrex [Celecoxib] Other (See Comments)    Unknown; patient can't remember  . Codeine Other (See Comments)    nausea    Pertinent  Health Maintenance Due  Topic Date Due  . PNA vac Low Risk Adult (2 of 2 - PCV13) 02/15/2016 (Originally 02/14/2010)  . INFLUENZA VACCINE  09/15/2015    Medications:   Medication List    Notice   This visit is during an admission. Changes to the med list made in this visit will be reflected in the After Visit Summary of the admission.     Review of Systems  Constitutional: Positive for fatigue. Negative for chills, diaphoresis and fever.  HENT: Negative.   Eyes: Negative.   Respiratory: Negative.  Negative for apnea, choking, chest tightness and shortness of breath.   Cardiovascular: Negative for chest pain and leg swelling.    Endocrine: Negative.   Genitourinary: Negative for dysuria, flank pain and frequency.  Neurological: Positive for light-headedness and numbness. Negative for seizures, syncope, facial asymmetry and headaches.    There were no vitals filed for this visit. There is no height or weight on file to calculate BMI. Physical Exam  Constitutional: He is oriented to person, place, and time. He appears well-developed and well-nourished. No distress.  Neck: Neck supple.  Cardiovascular: Regular rhythm.   No murmur heard. Tachycardia  Pulmonary/Chest: Breath sounds normal.  Abdominal: Soft. He exhibits no distension. There is no tenderness. There is no rebound.  Musculoskeletal: He exhibits no edema or tenderness.  Neurological: He is alert and oriented to person, place, and time.  Patient had 3/4 strength in Both Lower legs. Patient was unable to tell me if he had any new numbness in his legs.  Skin: He is not diaphoretic.    Labs reviewed: Basic Metabolic Panel:  Recent Labs  11/03/14 0700  07/10/15 0844 08/20/15 0735 09/14/15 1035  NA  --   < > 135 137 137  K  --   < > 4.3 4.6 4.3  CL  --   < > 102 103 102  CO2  --   < > 28 30 26   GLUCOSE  --   < > 89 97 115*  BUN  --   < > 19 21* 26*  CREATININE  --   < > 1.31* 1.10 1.24  CALCIUM  --   < > 8.5* 8.4* 9.0  MG 2.0  --   --   --   --   < > = values in this interval not displayed. Liver Function Tests:  Recent Labs  04/12/15 1535 04/28/15 1655 04/29/15 0448  AST 20 24 18   ALT 13* 15* 12*  ALKPHOS 105 94 77  BILITOT 0.7 0.7 0.7  PROT 8.0 7.2 6.3*  ALBUMIN 3.8 3.6 3.0*    Recent Labs  04/12/15 1535 04/28/15 1655  LIPASE 17 16   No results for input(s): AMMONIA in the last 8760 hours. CBC:  Recent Labs  04/25/15 0427 04/28/15 1655  08/20/15 0735 08/24/15 0530 09/14/15 1035  WBC 6.8 10.4  < > 7.2 8.4 10.6*  NEUTROABS 3.4 8.3*  --   --   --  7.1  HGB 10.4* 11.7*  < > 10.4* 10.0* 11.9*  HCT 32.2* 35.2*  < >  32.8* 30.9* 37.0*  MCV 88.7 86.9  < > 87.9 87.0 88.3  PLT 189 232  < > 183 157 217  < > =  values in this interval not displayed. Cardiac Enzymes:  Recent Labs  04/12/15 1535 04/12/15 2016 04/28/15 1655  TROPONINI <0.03 0.03 <0.03   BNP: Invalid input(s): POCBNP CBG: No results for input(s): GLUCAP in the last 8760 hours.  Procedures and Imaging Studies During Stay: No results found.  Assessment/Plan:    Dizziness. Will get CBC with Diff, CMP, EKG was reviewed and it had no new changes. Will Check UA Patient has been on Flomax but has catheter in place. Will stop Flomax and Lasix for now.  His dizziness can be due to dehydration and Medications. Will reevaluate after labs are back.

## 2015-09-16 ENCOUNTER — Other Ambulatory Visit (HOSPITAL_COMMUNITY)
Admission: RE | Admit: 2015-09-16 | Discharge: 2015-09-16 | Disposition: A | Discharge status: Home / Self Care | Payer: Medicare Other | Source: Skilled Nursing Facility | Attending: Internal Medicine | Admitting: Internal Medicine

## 2015-09-16 DIAGNOSIS — R339 Retention of urine, unspecified: Secondary | ICD-10-CM | POA: Diagnosis not present

## 2015-09-16 LAB — URINALYSIS W MICROSCOPIC (NOT AT ARMC)
BILIRUBIN URINE: NEGATIVE
Glucose, UA: NEGATIVE mg/dL
Ketones, ur: NEGATIVE mg/dL
NITRITE: POSITIVE — AB
PH: 6.5 (ref 5.0–8.0)
Protein, ur: NEGATIVE mg/dL
SPECIFIC GRAVITY, URINE: 1.01 (ref 1.005–1.030)

## 2015-09-16 LAB — URINE CULTURE

## 2015-09-18 LAB — URINE CULTURE

## 2015-09-25 ENCOUNTER — Non-Acute Institutional Stay (SKILLED_NURSING_FACILITY): Payer: Medicare Other | Admitting: Internal Medicine

## 2015-09-25 DIAGNOSIS — R200 Anesthesia of skin: Secondary | ICD-10-CM

## 2015-09-29 NOTE — Progress Notes (Signed)
Chief Complaint  Patient presents with  . Acute Visit    Secondary to right foot numbness     HPI:  Pt is a 82 y.o. male  who is seen today For complaints of right foot numbness at times.  However speaking with patient he says this has been going on for a while is very intermittent and does not last very long.  He does not complain of any recent trauma to the area-I do note he had arterial Dopplers done in November 2015 which did not show significant arterial occlusive disease of the right lower extremity.  For pain he does receive Vicodin twice a day as well as when necessary.            Past Medical History  Diagnosis Date  . Anxiety   . HTN (hypertension)   . Reflux   . Skin cancer, basal cell   . Diverticulitis   . Hyperlipidemia   . Inguinal hernia   . Ischemic heart disease   . Hiatal hernia   . Kidney stone   . Esophageal stricture   . History of recurrent TIAs   . Restless leg syndrome   . GERD (gastroesophageal reflux disease)   . Pleural effusion   . CHF (congestive heart failure) ([place])   . Urinary retention   . DNR (do not resuscitate)   . DJD (degenerative joint disease), cervical   . Osteoarthritis     bilat knees  . Gout   . Right hip pain   . Bilateral foot pain   . Esophageal dysmotility     age related per BPE 04/2015         Past Surgical History  Procedure Laterality Date  . Intestines    . Exploratory laparotomy w/ bowel resection    . Esophageal dilation    . Fracture surgery    . Hemiarthroplasty hip      Dr. Aline Brochure         Allergies  Allergen Reactions  . Celebrex [Celecoxib] Other (See Comments)    Unknown; patient can't remember  . Codeine Other (See Comments)    nausea          Current Outpatient Prescriptions on File Prior to Visit  Medication Sig Dispense Refill  . Amino Acids-Protein Hydrolys (FEEDING SUPPLEMENT, PRO-STAT SUGAR FREE 64,) LIQD Take 30 mLs by  mouth 2 (two) times daily.    Marland Kitchen aspirin EC 81 MG tablet Take 81 mg by mouth daily.    . Cholecalciferol (VITAMIN D) 2000 UNITS CAPS Take 1 capsule by mouth daily.    . diclofenac sodium (VOLTAREN) 1 % GEL Apply 2 grams to left hip and left knee q 6 hours PRN for pain    . docusate sodium (COLACE) 100 MG capsule Take 100 mg by mouth daily. For constipation.    . furosemide (LASIX) 20 MG tablet Take 20 mg by mouth every other day. For edema.    Marland Kitchen HYDROcodone-acetaminophen (NORCO/VICODIN) 5-325 MG tablet Take one tablet by mouth twice daily and every 6 hours as needed for pain. Max APAP 3gm/24 hours from all sources 180 tablet 0  . magnesium hydroxide (MILK OF MAGNESIA) 400 MG/5ML suspension Take 30 mLs by mouth daily as needed for mild constipation. If no relief try a Fleets enema, after 2 days.    . Melatonin 3 MG CAPS Take 3 mg by mouth at bedtime as needed (for sleep).    . mupirocin cream (BACTROBAN) 2 % Apply to  left ear per direction at bedtime    . NON FORMULARY Magic Cup once a day    . omeprazole (PRILOSEC) 20 MG capsule Take 20 mg by mouth daily.    . potassium chloride (K-DUR,KLOR-CON) 10 MEQ tablet Take 10 mEq by mouth every other day.     Marland Kitchen rOPINIRole (REQUIP) 3 MG tablet Take one tablet by mouth at bedtime    . sertraline (ZOLOFT) 50 MG tablet Take 50 mg by mouth at bedtime.     . sodium phosphate (FLEET) enema Place 1 enema rectally once. follow package directions give if resident has taken MOM for 2 days and has not had  a bowel movement    . tamsulosin (FLOMAX) 0.4 MG CAPS capsule Take 0.4 mg by mouth daily.    . Vitamins A & D (VITAMIN A & D) ointment Apply A&D ointment to bilateral feet and toes BID & PRN     No current facility-administered medications on file prior to visit.     Review of Systems   General no complaints of fever or chills.    Ear nose mouth and throat does not complaining of any visual changes or sore throat  dysphagia apparently has improved  . Respiratory r does not complain of shortness breath or cough.  Cardiac no chest pain minimal lower extremity edema.  GU has an indwelling Foley catheter does not complain of dysuria.  GI is not complaining of abdominal pain nausea vomiting diarrhea or constipation.  Muscle skeletal has complained of some left hip and knee pain in the past but this apparently has improved with Voltaren gel.--At times does complain of some foot numbness  on the right-he says this is intermittent and has not recently gotten any worse  Neurologic is not complaining of dizziness headache -has apparently intermittent numbness of the right foot says is this is not new or worsening  Psych does have a history of depression this is been quite stable for some time appears to be in good spirits quite interactive conversant      Immunization History  Administered Date(s) Administered  . Influenza,inj,Quad PF,36+ Mos 12/07/2014  . Influenza-Unspecified 11/20/2013  . PPD Test 03/16/2013  . Pneumococcal-Unspecified 02/14/2009       Pertinent  Health Maintenance Due  Topic Date Due  . PNA vac Low Risk Adult (2 of 2 - PCV13) 02/15/2016 (Originally 02/14/2010)  . INFLUENZA VACCINE  09/15/2015   No flowsheet data found. Functional Status Survey:     Body mass index is 22.69 kg/(m^2). Physical Exam   He is afebrile pulse of 68 respirations of 17  Gen. this is a pleasant elderly male in no distress sitting in his wheelchair  Skin is warm and I does have numerous solar induced changes diffuse     Oropharynx clear mucous membranes moist.  Chest is clear to auscultation with shallow air entry no labored breathing.  Heart sounds are somewhat distant largely regular rate and rhythm with occasional irregular beats he has minimal trace lower extremity edema.    Muscle skeletal general frailty is able to walk extremities 4 limited range of motion with  arthritic changes of his lower extremities knee area-do not note any acute deformities. Right foot appears to be baseline with previous exams there is no tenderness to palpation erythema or noted deformity    he does have arthritic changes of his toes.  Neurologic is grossly intact to speech is clear no lateralizing findings.-His right foot does have sensation to touch  it is warm and nontender-it is not erythematous  Psych continues to be largely alert and oriented pleasant and appropriate  Labs reviewed:  09/14/2015.  Sodium 137 potassium 4.3 BUN 26 creatinine 1.24.  WBC 10.6 hemoglobin 11.9 platelets 217  RecentLabs(withinlast365days)   Recent Labs  11/03/14 0700  04/29/15 0448 06/10/15 06/10/15 0715 07/10/15 0844  NA  --   < > 139 137 137 135  K  --   < > 3.9 4.3 4.3 4.3  CL  --   < > 104  --  101 102  CO2  --   < > 28  --  28 28  GLUCOSE  --   < > 88  --  92 89  BUN  --   < > 17 21 21* 19  CREATININE  --   < > 1.18 1.1 1.11 1.31*  CALCIUM  --   < > 8.4*  --  8.9 8.5*  MG 2.0  --   --   --   --   --   < > = values in this interval not displayed.    RecentLabs(withinlast365days)   Recent Labs  04/12/15 1535 04/28/15 1655 04/29/15 0448  AST 20 24 18   ALT 13* 15* 12*  ALKPHOS 105 94 77  BILITOT 0.7 0.7 0.7  PROT 8.0 7.2 6.3*  ALBUMIN 3.8 3.6 3.0*      RecentLabs(withinlast365days)   Recent Labs  04/12/15 1535 04/25/15 0427 04/28/15 1655 04/29/15 0448 06/10/15 06/10/15 0715  WBC 14.1* 6.8 10.4 11.6* 6.9 6.9  NEUTROABS 11.6* 3.4 8.3*  --   --   --   HGB 11.9* 10.4* 11.7* 10.6*  --  11.9*  HCT 36.3* 32.2* 35.2* 31.8*  --  36.5*  MCV 86.8 88.7 86.9 87.6  --  86.7  PLT 310 189 232 209  --  224     RecentLabs  No results found for: TSH   RecentLabs       Lab Results  Component Value Date   HGBA1C 6.1* 11/12/2014     RecentLabs  No results found for: CHOL, HDL, LDLCALC, LDLDIRECT, TRIG, CHOLHDL     Significant Diagnostic Results in last 30 days:  ImagingResults  No results found.    Assessment/Plan . #1 intermittent right foot numbness-patient says this is not new intermittent and not getting worse-apparently this lasts only for short time-he does not really appear to be causing him discomfort at this point-if this persists or worsens consider repeat arterial Dopplers-also consider Neurontin-however would like to minimize meds-he does not indicate really it's a problem but this will have to be watched.  I suspect he would be a poor candidate for any invasive procedures with his advanced age and comorbidities.  [id number]

## 2015-10-13 ENCOUNTER — Non-Acute Institutional Stay (SKILLED_NURSING_FACILITY): Payer: Medicare Other | Admitting: Internal Medicine

## 2015-10-13 ENCOUNTER — Encounter: Payer: Self-pay | Admitting: Internal Medicine

## 2015-10-13 DIAGNOSIS — D649 Anemia, unspecified: Secondary | ICD-10-CM

## 2015-10-13 DIAGNOSIS — N400 Enlarged prostate without lower urinary tract symptoms: Secondary | ICD-10-CM

## 2015-10-13 DIAGNOSIS — R1310 Dysphagia, unspecified: Secondary | ICD-10-CM | POA: Diagnosis not present

## 2015-10-13 DIAGNOSIS — F329 Major depressive disorder, single episode, unspecified: Secondary | ICD-10-CM | POA: Diagnosis not present

## 2015-10-13 DIAGNOSIS — I5031 Acute diastolic (congestive) heart failure: Secondary | ICD-10-CM

## 2015-10-13 DIAGNOSIS — M1612 Unilateral primary osteoarthritis, left hip: Secondary | ICD-10-CM | POA: Diagnosis not present

## 2015-10-13 DIAGNOSIS — F32A Depression, unspecified: Secondary | ICD-10-CM

## 2015-10-13 NOTE — Progress Notes (Signed)
Location:   [facility] Room Number: 139/W Place of Service:  SNF [phone]) Provider:  Leeanne Deed, MD  Patient Care Team: Hendricks Limes, MD as PCP - General (Internal Medicine) Danie Binder, MD as Consulting Physician (Gastroenterology)  Extended Emergency Contact Information Primary Emergency Contact: Black,Shirley Address: [address]          [place], [place] [address] [name] of [place] Phone: [phone] Mobile Phone: [phone] Relation: Daughter  Code Status:  DNR Goals of care: Advanced Directive information Advanced Directives 10/13/2015  Does patient have an advance directive? Yes  Type of Advance Directive Out of facility DNR (pink MOST or yellow form)  Does patient want to make changes to advanced directive? No - Patient declined  Copy of advanced directive(s) in chart? Yes  Pre-existing out of facility DNR order (yellow form or pink MOST form) -     Chief Complaint  Patient presents with  . Medical Management of Chronic Issues    Routine Visit   For management of multiple medical issues including osteoarthritis-on congestive heart failure restless leg syndrome history TIAs BPH osteoarthritis dysphasia and anemia HPI:  Pt is a 82 y.o. male seen today for medical management of chronic diseases. Noted above.  He appears to be stable.  He was seen several weeks ago for an episode of dizziness which appears to have resolved unremarkably blood work and EKG were unremarkable he has not complained of any further dizziness.  His Flomax and Lasix were discontinued secondary to concerns he may be somewhat dehydrated and this may be contributing as well to his dizziness.  He does have a history of CHF but this appears well controlled despite being off the diuretic he does not have any increased edema from baseline he does not complain of any shortness of breath or increased congestion.  He does have a history of  significant dysphagia which actually required hospitalization at one point a was thought he possibly may have strictures this improved on a PPI.  He has been followed by GI I did review the consult note in May of this year they gave him a diagnosis of age-related dysmotility but thought to be doing well.  He also has a history at times of osteomyelitis of his left foot but this has stabilized opacities been treated on doxycycline and Cipro-however we been trying to avoid this combination recently since it was thought this contributed to his dysphagia issues.  He also has a history of pneumonia earlier this year was hospitalized with an elevated white count of over 50,000 he did respond antibiotics however and there is been no reoccurrence.  He continues to have a chronic indwelling Foley catheter with a history of BPH this has not been an issue recently.  He also has a history of basal cell carcinoma is followed closely by dermatology which has removed suspicious lesions.  He has a listed history of hypertension this does not appear to be an issue I do his blood pressure manually today was 116/60-see previous reading 106/58-actually I's also CA 0000000 although systolics under [id number] do not appear to be common in this may have been a machine variation.  Depression appears very stable he is on Zoloft-several months ago he did tell me he thought people might be better off with him no longer around but he said this was just the result of very transitory feelings during a bad day and there has been no reoccurrence since  psych did see him as well for this      Past Medical History:  Diagnosis Date  . Anxiety   . Bilateral foot pain   . CHF (congestive heart failure) ([place])   . Diverticulitis   . DJD (degenerative joint disease), cervical   . DNR (do not resuscitate)   . Esophageal dysmotility    age related per BPE 04/2015  . Esophageal stricture   . GERD (gastroesophageal reflux disease)   . Gout    . Hiatal hernia   . History of recurrent TIAs   . HTN (hypertension)   . Hyperlipidemia   . Inguinal hernia   . Ischemic heart disease   . Kidney stone   . Osteoarthritis    bilat knees  . Pleural effusion   . Reflux   . Restless leg syndrome   . Right hip pain   . Skin cancer, basal cell   . Urinary retention    Past Surgical History:  Procedure Laterality Date  . ESOPHAGEAL DILATION    . EXPLORATORY LAPAROTOMY W/ BOWEL RESECTION    . FRACTURE SURGERY    . HEMIARTHROPLASTY HIP     Dr. Aline Brochure  . intestines      Allergies  Allergen Reactions  . Celebrex [Celecoxib] Other (See Comments)    Unknown; patient can't remember  . Codeine Other (See Comments)    nausea    Current Outpatient Prescriptions on File Prior to Visit  Medication Sig Dispense Refill  . Amino Acids-Protein Hydrolys (FEEDING SUPPLEMENT, PRO-STAT SUGAR FREE 64,) LIQD Take 30 mLs by mouth 2 (two) times daily.    Marland Kitchen aspirin EC 81 MG tablet Take 81 mg by mouth daily.    . Cholecalciferol (VITAMIN D) 2000 UNITS CAPS Take 1 capsule by mouth daily.    . diclofenac sodium (VOLTAREN) 1 % GEL Apply 2 grams to left hip and left knee q 6 hours PRN for pain    . docusate sodium (COLACE) 100 MG capsule Take 100 mg by mouth daily. For constipation.    Marland Kitchen HYDROcodone-acetaminophen (NORCO/VICODIN) 5-325 MG tablet Take one tablet by mouth twice daily and every 6 hours as needed for pain. Max APAP 3gm/24 hours from all sources 180 tablet 0  . magnesium hydroxide (MILK OF MAGNESIA) 400 MG/5ML suspension Take 30 mLs by mouth daily as needed for mild constipation. If no relief try a Fleets enema, after 2 days.    . Melatonin 3 MG CAPS Take 3 mg by mouth at bedtime as needed (for sleep).    . NON FORMULARY Magic Cup once a day    . Nutritional Supplements (ENSURE ENLIVE PO) Give by mouth  once a day    . omeprazole (PRILOSEC) 20 MG capsule Take 20 mg by mouth daily.    Marland Kitchen rOPINIRole (REQUIP) 3 MG tablet Take one tablet by  mouth at bedtime    . sertraline (ZOLOFT) 50 MG tablet Take 50 mg by mouth at bedtime.     . sodium phosphate (FLEET) enema Place 1 enema rectally once. follow package directions give if resident has taken MOM for 2 days and has not had  a bowel movement    . triamcinolone cream (KENALOG) 0.1 % Apply 1 application topically 2 (two) times daily.    . Vitamins A & D (VITAMIN A & D) ointment Apply A&D ointment to bilateral feet and toes BID & PRN    . furosemide (LASIX) 20 MG tablet Take 20 mg by mouth every other  day. For edema. ( HOLD AS OF 09/14/15 )    . tamsulosin (FLOMAX) 0.4 MG CAPS capsule Take 0.4 mg by mouth. ( HOLD AS OF 09/14/15)     No current facility-administered medications on file prior to visit.    Of note his Flomax and Lasix have been discontinued  Review of Systems   General no complaints of fever or chills.  In does not complain of rashes or itching does have a history of superficial skin cancers that are followedand removed by dermatology  Ear nose mouth and throat does not complaining of any visual changes or sore throat dysphagia apparently has improved  . Respiratory r does not complain of shortness breath or cough.  Cardiac no chest pain minimal lower extremity edema.  GU has an indwelling Foley catheter does not complain of dysuria.  GI is not complaining of abdominal pain nausea vomiting diarrhea or constipation.  Muscle skeletal has complained of some left hip and knee pain in the past but this apparently has improved with Voltaren gel.--At times does complain of some foot numbness  on the right-he says this is intermittent and has not recently gotten any worse  Neurologic is not complaining of dizziness headache -has apparently intermittent numbness of the right foot says is this is not new or worsening  Psych does have a history of depression this is been quite stable for some time appears to be in good spirits quite interactive conversant which  is usually his baseline        Immunization History  Administered Date(s) Administered  . Influenza,inj,Quad PF,36+ Mos 12/07/2014  . Influenza-Unspecified 11/20/2013  . PPD Test 03/16/2013  . Pneumococcal-Unspecified 02/14/2009   Pertinent  Health Maintenance Due  Topic Date Due  . INFLUENZA VACCINE  09/15/2015  . PNA vac Low Risk Adult (2 of 2 - PCV13) 02/15/2016 (Originally 02/14/2010)   No flowsheet data found. Functional Status Survey:    Vitals:   10/13/15 1600  BP: (!) 106/58  Pulse: 74  Resp: 20  Temp: 98 F (36.7 C)  TempSrc: Oral  SpO2: 100%  Weight: 163 lb 6.4 oz (74.1 kg)  Height: 6' (1.829 m)  blood pressure today was 116/60 pulse was 60 Body mass index is 22.16 kg/m. Physical Exam Gen. this is a pleasant elderly male in no distress sitting in his wheelchair  Skin is warm and I does have numerous solar induced changes diffuse he has a Band-Aid over  superior. Portion lobe of his right ear says this is for comfort    Oropharynx clear mucous membranes moist.  Chest is clear to auscultation with shallow air entry no labored breathing.  Heart sounds are somewhat distant largely regular rate and rhythm with occasional irregular beats he has minimal trace lower extremity edema.  Abdomen continues to have a right-sided hernia which appears unchanged from previous exams his abdomen continues to be protuberant but soft and nontender he does have a well-healed surgical scar-bowel sounds are positive  Muscle skeletal general frailty is able to walk extremities 4 limited range of motion with arthritic changes of his lower extremities knee area-do not note any acute deformities. Right foot appears to be baseline with previous exams there is no tenderness to palpation erythema or noted deformity  Left foot appears at baseline as well no sign of infection or erythema    he does have arthritic changes of his toes.  Neurologic is grossly intact to  speech is clear no lateralizing findings.- Touch sensation appears  to be intact lower extremities bilaterally as well as feet  Psych continues to be largely alert and oriented pleasant and appropriate   Labs reviewed:  Recent Labs  11/03/14 0700  07/10/15 0844 08/20/15 0735 09/14/15 1035  NA  --   < > 135 137 137  K  --   < > 4.3 4.6 4.3  CL  --   < > 102 103 102  CO2  --   < > 28 30 26   GLUCOSE  --   < > 89 97 115*  BUN  --   < > 19 21* 26*  CREATININE  --   < > 1.31* 1.10 1.24  CALCIUM  --   < > 8.5* 8.4* 9.0  MG 2.0  --   --   --   --   < > = values in this interval not displayed.  Recent Labs  04/12/15 1535 04/28/15 1655 04/29/15 0448  AST 20 24 18   ALT 13* 15* 12*  ALKPHOS 105 94 77  BILITOT 0.7 0.7 0.7  PROT 8.0 7.2 6.3*  ALBUMIN 3.8 3.6 3.0*    Recent Labs  04/25/15 0427 04/28/15 1655  08/20/15 0735 08/24/15 0530 09/14/15 1035  WBC 6.8 10.4  < > 7.2 8.4 10.6*  NEUTROABS 3.4 8.3*  --   --   --  7.1  HGB 10.4* 11.7*  < > 10.4* 10.0* 11.9*  HCT 32.2* 35.2*  < > 32.8* 30.9* 37.0*  MCV 88.7 86.9  < > 87.9 87.0 88.3  PLT 189 232  < > 183 157 217  < > = values in this interval not displayed. No results found for: TSH Lab Results  Component Value Date   HGBA1C 6.1 (H) 11/12/2014   No results found for: CHOL, HDL, LDLCALC, LDLDIRECT, TRIG, CHOLHDL  Significant Diagnostic Results in last 30 days:  No results found.  Assessment/Plan  1 history of dizziness-this has apparently been a one time instance no reoccurrence lab work was unremarkable as well as EKG-Lasix and Flomax were discontinued at this point will monitor.  #2 history of anemia with likely an element of chronic disease this is been stable with a hemoglobin of 11.9 on lab done on 09/14/2015 occult blood testing has been negative will update CBC.  #3 history of osteoarthritis he is doing well with topical Voltaren gel-he also has Vicodin every 6 hours when necessary as well as twice a day  routinely appears to be tolerating this well.  #4 history of dysphagia this is been a significant issue in the past but this has stabilized on Prilosec again he has had hospitalization in GI follow-up on this chart to have age-related dysmotility possible some stricture activity.  #5-history of osteomyelitis this is been stable for some time in the past as been treated with doxycycline and Cipro again does not do well with this in regards to his dysphagia we've been trying to avoid this combination.  #6-history of pneumonia no recent evidence of this he did require hospitalization at one point with an elevated white count.  #7 history of TIAs-she does not really have any residual effects of this he is on aspirin.  #8 history of BPH-he appears to be doing well with this despite being off the Flomax-he does have a chronic indwelling Foley catheter no recent UTIs which is encouraging.  #9 history of restless legs this is been stable on Requip.  #10 history of depression this is stable as noted above on Zoloft.  #  11 listed history of hypertension currently on no medications nonetheless as noted above his blood pressure appears to be stable.  #12 history of CHF this is stable as well no evidence of edema or increased congestion.  A #13 history of numbness this is intermittent complain of lower extremities-this has not progressed-it appears 1. he been on Neurontin but this was discontinued I suspect to try to minimize medications nonetheless thisdoess not really bother him at this point will continue to monitor--do note he did have a Doppler study done in November 2015 which did not show any significant arterial occlusive disease of the right lower extremity Would be a poor candidate for any aggressive intervention.  #14 history of renal insufficiency creatinine of 1.24 BUN of 26 on 09/14/2015 is relatively baseline Will update lab.  [id number]--of note greater than 40 minutes spent assessing  patient-discussing his concerns-discussing patient's status with nursing staff-reviewing his chart-his labs-and coordinating formulating a plan of care for numerous diagnoses-of note greater than 50% of time spent coordinating plan of care

## 2015-10-14 ENCOUNTER — Encounter (HOSPITAL_COMMUNITY)
Admission: RE | Admit: 2015-10-14 | Discharge: 2015-10-14 | Disposition: A | Discharge status: Home / Self Care | Payer: Medicare Other | Source: Skilled Nursing Facility | Attending: Internal Medicine | Admitting: Internal Medicine

## 2015-10-14 DIAGNOSIS — S01302D Unspecified open wound of left ear, subsequent encounter: Secondary | ICD-10-CM | POA: Diagnosis present

## 2015-10-14 DIAGNOSIS — M1000 Idiopathic gout, unspecified site: Secondary | ICD-10-CM | POA: Diagnosis present

## 2015-10-14 DIAGNOSIS — I509 Heart failure, unspecified: Secondary | ICD-10-CM | POA: Insufficient documentation

## 2015-10-14 DIAGNOSIS — R339 Retention of urine, unspecified: Secondary | ICD-10-CM | POA: Diagnosis present

## 2015-10-14 DIAGNOSIS — I10 Essential (primary) hypertension: Secondary | ICD-10-CM | POA: Diagnosis present

## 2015-10-14 DIAGNOSIS — M869 Osteomyelitis, unspecified: Secondary | ICD-10-CM | POA: Diagnosis present

## 2015-10-14 LAB — CBC WITH DIFFERENTIAL/PLATELET
BASOS ABS: 0 10*3/uL (ref 0.0–0.1)
BASOS PCT: 0 %
EOS ABS: 0.4 10*3/uL (ref 0.0–0.7)
EOS PCT: 4 %
HCT: 35 % — ABNORMAL LOW (ref 39.0–52.0)
Hemoglobin: 11.3 g/dL — ABNORMAL LOW (ref 13.0–17.0)
Lymphocytes Relative: 17 %
Lymphs Abs: 1.6 10*3/uL (ref 0.7–4.0)
MCH: 28.7 pg (ref 26.0–34.0)
MCHC: 32.3 g/dL (ref 30.0–36.0)
MCV: 88.8 fL (ref 78.0–100.0)
MONO ABS: 1.1 10*3/uL — AB (ref 0.1–1.0)
Monocytes Relative: 12 %
Neutro Abs: 6.3 10*3/uL (ref 1.7–7.7)
Neutrophils Relative %: 67 %
PLATELETS: 190 10*3/uL (ref 150–400)
RBC: 3.94 MIL/uL — ABNORMAL LOW (ref 4.22–5.81)
RDW: 14.6 % (ref 11.5–15.5)
WBC: 9.3 10*3/uL (ref 4.0–10.5)

## 2015-10-14 LAB — COMPREHENSIVE METABOLIC PANEL
ALBUMIN: 3.4 g/dL — AB (ref 3.5–5.0)
ALT: 13 U/L — ABNORMAL LOW (ref 17–63)
AST: 18 U/L (ref 15–41)
Alkaline Phosphatase: 104 U/L (ref 38–126)
Anion gap: 6 (ref 5–15)
BUN: 20 mg/dL (ref 6–20)
CHLORIDE: 102 mmol/L (ref 101–111)
CO2: 30 mmol/L (ref 22–32)
Calcium: 8.4 mg/dL — ABNORMAL LOW (ref 8.9–10.3)
Creatinine, Ser: 0.99 mg/dL (ref 0.61–1.24)
GFR calc Af Amer: >60 mL/min (ref >60)
Glucose, Bld: 98 mg/dL (ref 65–99)
POTASSIUM: 4.8 mmol/L (ref 3.5–5.1)
SODIUM: 138 mmol/L (ref 135–145)
Total Bilirubin: 0.4 mg/dL (ref 0.3–1.2)
Total Protein: 6.8 g/dL (ref 6.5–8.1)

## 2015-10-23 ENCOUNTER — Other Ambulatory Visit: Payer: Self-pay | Admitting: *Deleted

## 2015-10-23 MED ORDER — HYDROCODONE-ACETAMINOPHEN 5-325 MG PO TABS: ORAL_TABLET | ORAL | 0 refills | Status: DC

## 2015-10-23 NOTE — Telephone Encounter (Signed)
[facility] #[phone] Fax: [phone]   

## 2015-11-19 ENCOUNTER — Encounter: Payer: Self-pay | Admitting: Internal Medicine

## 2015-11-19 ENCOUNTER — Non-Acute Institutional Stay (SKILLED_NURSING_FACILITY): Payer: Medicare Other | Admitting: Internal Medicine

## 2015-11-19 DIAGNOSIS — F32A Depression, unspecified: Secondary | ICD-10-CM

## 2015-11-19 DIAGNOSIS — D509 Iron deficiency anemia, unspecified: Secondary | ICD-10-CM | POA: Diagnosis not present

## 2015-11-19 DIAGNOSIS — F329 Major depressive disorder, single episode, unspecified: Secondary | ICD-10-CM

## 2015-11-19 DIAGNOSIS — R310 Gross hematuria: Secondary | ICD-10-CM | POA: Diagnosis not present

## 2015-11-19 DIAGNOSIS — N401 Enlarged prostate with lower urinary tract symptoms: Secondary | ICD-10-CM | POA: Diagnosis not present

## 2015-11-19 NOTE — Progress Notes (Signed)
Location:   [facility] Room Number: 139/W Place of Service:  SNF (31) Provider:  Falisha Osment,Rhythm Gubbels  No primary care provider on file.  Patient Care Team: Danie Binder, MD as Consulting Physician (Gastroenterology) [name], MD as Consulting Physician [facility])  Extended Emergency Contact Information Primary Emergency Contact: Black,Shirley Address: [address]          [place], [place] [address] [name] of [place] Phone: [phone] Mobile Phone: [phone] Relation: Daughter  Code Status:  DNR Goals of care: Advanced Directive information Advanced Directives 11/19/2015  Does patient have an advance directive? Yes  Type of Advance Directive Out of facility DNR (pink MOST or yellow form)  Does patient want to make changes to advanced directive? No - Patient declined  Copy of advanced directive(s) in chart? Yes  Pre-existing out of facility DNR order (yellow form or pink MOST form) -     Chief Complaint  Patient presents with  . Medical Management of Chronic Issues    Routine Visit    HPI:  Pt is a 82 y.o. male seen today for medical management of chronic diseases.   Patient has been clinically stable since last routine visit. Today it seems his cathter was pulled by mistake as he was complaining that nurse saw some blood there. He is not aware that it was pulled. Patient Has chronic Cathter due to Urinary retention.  Otherwise patient has been doing well. His dizziness seems to have resolved with Stopping his Lasix and Flomax. He has been eating well and actually has gained some weight recently.  Past Medical History:  Diagnosis Date  . Anxiety   . Bilateral foot pain   . CHF (congestive heart failure) ([facility])   . Diverticulitis   . DJD (degenerative joint disease), cervical   . DNR (do not resuscitate)   . Esophageal dysmotility    age related per BPE 04/2015  . Esophageal stricture   . GERD (gastroesophageal reflux disease)   .  Gout   . Hiatal hernia   . History of recurrent TIAs   . HTN (hypertension)   . Hyperlipidemia   . Inguinal hernia   . Ischemic heart disease   . Kidney stone   . Osteoarthritis    bilat knees  . Pleural effusion   . Reflux   . Restless leg syndrome   . Right hip pain   . Skin cancer, basal cell   . Urinary retention    Past Surgical History:  Procedure Laterality Date  . ESOPHAGEAL DILATION    . EXPLORATORY LAPAROTOMY W/ BOWEL RESECTION    . FRACTURE SURGERY    . HEMIARTHROPLASTY HIP     Dr. Aline Brochure  . intestines      Allergies  Allergen Reactions  . Celebrex [Celecoxib] Other (See Comments)    Unknown; patient can't remember  . Codeine Other (See Comments)    nausea    Current Outpatient Prescriptions on File Prior to Visit  Medication Sig Dispense Refill  . Amino Acids-Protein Hydrolys (FEEDING SUPPLEMENT, PRO-STAT SUGAR FREE 64,) LIQD Take 30 mLs by mouth 2 (two) times daily.    Marland Kitchen aspirin EC 81 MG tablet Take 81 mg by mouth daily.    . Cholecalciferol (VITAMIN D) 2000 UNITS CAPS Take 1 capsule by mouth daily.    . diclofenac sodium (VOLTAREN) 1 % GEL Apply 2 grams to left hip and left knee q 6 hours PRN for pain    . docusate sodium (  COLACE) 100 MG capsule Take 100 mg by mouth daily. For constipation.    Marland Kitchen HYDROcodone-acetaminophen (NORCO/VICODIN) 5-325 MG tablet Take one tablet by mouth twice daily and every 6 hours as needed for pain. Max APAP 3gm/24 hours from all sources 180 tablet 0  . magnesium hydroxide (MILK OF MAGNESIA) 400 MG/5ML suspension Take 30 mLs by mouth daily as needed for mild constipation. If no relief try a Fleets enema, after 2 days.    . Melatonin 3 MG CAPS Take 3 mg by mouth at bedtime as needed (for sleep).    . NON FORMULARY Magic Cup once a day    . Nutritional Supplements (ENSURE ENLIVE PO) Give by mouth  once a day    . omeprazole (PRILOSEC) 20 MG capsule Take 20 mg by mouth daily.    Marland Kitchen rOPINIRole (REQUIP) 3 MG tablet Take one tablet  by mouth at bedtime    . sertraline (ZOLOFT) 50 MG tablet Take 50 mg by mouth at bedtime.     . sodium phosphate (FLEET) enema Place 1 enema rectally once. follow package directions give if resident has taken MOM for 2 days and has not had  a bowel movement    . triamcinolone cream (KENALOG) 0.1 % Apply 1 application topically 2 (two) times daily.    . Vitamins A & D (VITAMIN A & D) ointment Apply A&D ointment to bilateral feet and toes BID & PRN     No current facility-administered medications on file prior to visit.      Review of Systems  Constitutional: Negative for activity change, appetite change, chills, diaphoresis, fatigue and fever.  Respiratory: Negative.   Cardiovascular: Negative.   Gastrointestinal: Negative.   Genitourinary: Positive for difficulty urinating and hematuria. Negative for flank pain.  Psychiatric/Behavioral: Negative.     Immunization History  Administered Date(s) Administered  . Influenza,inj,Quad PF,36+ Mos 12/07/2014  . Influenza-Unspecified 11/20/2013  . PPD Test 03/16/2013  . Pneumococcal-Unspecified 02/14/2009   Pertinent  Health Maintenance Due  Topic Date Due  . INFLUENZA VACCINE  01/15/2016 (Originally 09/15/2015)  . PNA vac Low Risk Adult (2 of 2 - PCV13) 02/15/2016 (Originally 02/14/2010)   No flowsheet data found. Functional Status Survey:    Vitals:   11/19/15 1021  BP: (!) 92/54  Pulse: 67  Resp: 20  Temp: 97.9 F (36.6 C)  TempSrc: Oral  SpO2: 100%  Weight: 168 lb 3.2 oz (76.3 kg)  Height: 6' (1.829 m)   Body mass index is 22.81 kg/m. Physical Exam  Constitutional: He is oriented to person, place, and time. He appears well-developed and well-nourished.  HENT:  Head: Normocephalic.  Mouth/Throat: Oropharynx is clear and moist.  Cardiovascular: Normal rate, regular rhythm and normal heart sounds.   Pulmonary/Chest: Effort normal and breath sounds normal. No respiratory distress. He has no wheezes. He has no rales.    Abdominal: Soft. He exhibits no distension. There is no tenderness.  Patient has abdominal hernia.  Musculoskeletal: He exhibits no edema.  Neurological: He is alert and oriented to person, place, and time.  Good strength in UE.. LE is hard to test due to his severe knee arthritis.    Labs reviewed:  Recent Labs  08/20/15 0735 09/14/15 1035 10/14/15 0750  NA 137 137 138  K 4.6 4.3 4.8  CL 103 102 102  CO2 30 26 30   GLUCOSE 97 115* 98  BUN 21* 26* 20  CREATININE 1.10 1.24 0.99  CALCIUM 8.4* 9.0 8.4*    Recent  Labs  04/28/15 1655 04/29/15 0448 10/14/15 0750  AST 24 18 18   ALT 15* 12* 13*  ALKPHOS 94 77 104  BILITOT 0.7 0.7 0.4  PROT 7.2 6.3* 6.8  ALBUMIN 3.6 3.0* 3.4*    Recent Labs  04/28/15 1655  08/24/15 0530 09/14/15 1035 10/14/15 0750  WBC 10.4  < > 8.4 10.6* 9.3  NEUTROABS 8.3*  --   --  7.1 6.3  HGB 11.7*  < > 10.0* 11.9* 11.3*  HCT 35.2*  < > 30.9* 37.0* 35.0*  MCV 86.9  < > 87.0 88.3 88.8  PLT 232  < > 157 217 190  < > = values in this interval not displayed. No results found for: TSH Lab Results  Component Value Date   HGBA1C 6.1 (H) 11/12/2014   No results found for: CHOL, HDL, LDLCALC, LDLDIRECT, TRIG, CHOLHDL  Significant Diagnostic Results in last 30 days:  No results found.  Assessment/Plan  Gross hematuria  Most likely due to his Foley Cathter. The urine now seems more clear. Will push fluids right now. Watch the flow and if needed we have to change the catheter.   Iron deficiency anemia,   His last Hgb was 11.3 and has been stable. Patient had been occult negative before. No more W/U warranted due to his clinical prognosis.  Severe Arthritis Patient pain seems to well controlled on topical gel and PRN narcotics.   Depression  Patient is stable with his mood. On Zoloft.  Family/ staff Communication:  Labs/tests ordered:

## 2015-12-02 DIAGNOSIS — F329 Major depressive disorder, single episode, unspecified: Secondary | ICD-10-CM | POA: Diagnosis not present

## 2015-12-02 DIAGNOSIS — G4700 Insomnia, unspecified: Secondary | ICD-10-CM | POA: Diagnosis not present

## 2015-12-10 ENCOUNTER — Other Ambulatory Visit: Payer: Self-pay

## 2015-12-10 MED ORDER — HYDROCODONE-ACETAMINOPHEN 5-325 MG PO TABS: ORAL_TABLET | ORAL | 0 refills | Status: DC

## 2015-12-10 NOTE — Telephone Encounter (Signed)
Rx faxed to [facility] at [phone].   [address] [place], [place] [address]  Phone #: [phone] 

## 2015-12-17 ENCOUNTER — Encounter: Payer: Self-pay | Admitting: Internal Medicine

## 2015-12-17 ENCOUNTER — Non-Acute Institutional Stay (SKILLED_NURSING_FACILITY): Payer: Medicare Other | Admitting: Internal Medicine

## 2015-12-17 DIAGNOSIS — N401 Enlarged prostate with lower urinary tract symptoms: Secondary | ICD-10-CM

## 2015-12-17 DIAGNOSIS — R1310 Dysphagia, unspecified: Secondary | ICD-10-CM

## 2015-12-17 DIAGNOSIS — M1712 Unilateral primary osteoarthritis, left knee: Secondary | ICD-10-CM | POA: Diagnosis not present

## 2015-12-17 DIAGNOSIS — G2581 Restless legs syndrome: Secondary | ICD-10-CM

## 2015-12-17 DIAGNOSIS — D509 Iron deficiency anemia, unspecified: Secondary | ICD-10-CM | POA: Diagnosis not present

## 2015-12-17 NOTE — Progress Notes (Signed)
Location:   [place] Room Number: 139/W Place of Service:  SNF (31) Provider:  Marrah Vanevery,Laban Orourke  No primary care provider on file.  Patient Care Team: Danie Binder, MD as Consulting Physician (Gastroenterology) [name], MD as Consulting Physician [facility])  Extended Emergency Contact Information Primary Emergency Contact: Black,Shirley Address: [address]          [place], [place] [address] [name] of [place] Phone: [phone] Mobile Phone: [phone] Relation: Daughter  Code Status:  DNR Goals of care: Advanced Directive information Advanced Directives 12/17/2015  Does patient have an advance directive? Yes  Type of Advance Directive Out of facility DNR (pink MOST or yellow form)  Does patient want to make changes to advanced directive? No - Patient declined  Copy of advanced directive(s) in chart? Yes  Pre-existing out of facility DNR order (yellow form or pink MOST form) -     Chief Complaint  Patient presents with  . Medical Management of Chronic Issues    Routine Visit  Medical management of chronic medical conditions including arthritis-history BPH-CHF-history of TIAs-restless legs-depression-dysphagia-  HPI:  Pt is a 82 y.o. male seen today for medical management of chronic diseases. .  He actually is quite stable he was recently supposed most recently seen for dizziness and his Lasix and Flomax were discontinued and this appears to have largely resolved.  He also was thought to have some age related dysmotility which actually led to a hospitalization at one point secondary to difficulty swallowing he is on a proton pump inhibitor there is recommendation to avoid the combination of Cipro and doxycycline since apparently this has led to worsening dysphagia.  He does have a history of CHF but this appears well controlled despite being off the diuretic-I do not see any edema he does not complain of any shortness of breath  ambulates in a wheelchair without difficulty.  He also has a history of osteomyelitis of his left foot but this is been stable for considerable amount of time as well.  At one point earlier this year he was hospitalized for pneumonia with a white count which is clean was over 50,000 however he responded very well to antibiotics and this has been stable it appears since then.  He also has a history of basal cell carcinomas and is followed by dermatology.  In regards to restless legs he underwent on Requip apparently with some relief.  As far as pain management he is on Vicodin twice a day as well as when necessary and also receives all Taryn gel with relief with a significant history of osteoarthritis.  Currently he is ambulating in his chair says he has no complaints until he is to be in good spirits-at one point there was concern he had worsening depression but this appears to have been quite transitory he is on Zoloft and is followed by psychiatric services as needed        Past Medical History:  Diagnosis Date  . Anxiety   . Bilateral foot pain   . CHF (congestive heart failure) ([facility])   . Diverticulitis   . DJD (degenerative joint disease), cervical   . DNR (do not resuscitate)   . Esophageal dysmotility    age related per BPE 04/2015  . Esophageal stricture   . GERD (gastroesophageal reflux disease)   . Gout   . Hiatal hernia   . History of recurrent TIAs   . HTN (hypertension)   . Hyperlipidemia   .  Inguinal hernia   . Ischemic heart disease   . Kidney stone   . Osteoarthritis    bilat knees  . Pleural effusion   . Reflux   . Restless leg syndrome   . Right hip pain   . Skin cancer, basal cell   . Urinary retention    Past Surgical History:  Procedure Laterality Date  . ESOPHAGEAL DILATION    . EXPLORATORY LAPAROTOMY W/ BOWEL RESECTION    . FRACTURE SURGERY    . HEMIARTHROPLASTY HIP     Dr. Aline Brochure  . intestines      Allergies  Allergen Reactions  .  Celebrex [Celecoxib] Other (See Comments)    Unknown; patient can't remember  . Codeine Other (See Comments)    nausea    Current Outpatient Prescriptions on File Prior to Visit  Medication Sig Dispense Refill  . aspirin EC 81 MG tablet Take 81 mg by mouth daily.    . Cholecalciferol (VITAMIN D) 2000 UNITS CAPS Take 1 capsule by mouth daily.    . diclofenac sodium (VOLTAREN) 1 % GEL Apply 2 grams to left hip and left knee q 6 hours PRN for pain    . docusate sodium (COLACE) 100 MG capsule Take 100 mg by mouth daily. For constipation.    Marland Kitchen HYDROcodone-acetaminophen (NORCO/VICODIN) 5-325 MG tablet Take one tablet by mouth twice daily and every 6 hours as needed for pain. Max APAP 3gm/24 hours from all sources 180 tablet 0  . magnesium hydroxide (MILK OF MAGNESIA) 400 MG/5ML suspension Take 30 mLs by mouth daily as needed for mild constipation. If no relief try a Fleets enema, after 2 days.    . Melatonin 3 MG CAPS Take 3 mg by mouth at bedtime as needed (for sleep).    . NON FORMULARY Magic Cup once a day    . omeprazole (PRILOSEC) 20 MG capsule Take 20 mg by mouth daily.    Marland Kitchen rOPINIRole (REQUIP) 3 MG tablet Take one tablet by mouth at bedtime    . sertraline (ZOLOFT) 50 MG tablet Take 50 mg by mouth at bedtime.     . sodium phosphate (FLEET) enema Place 1 enema rectally once. follow package directions give if resident has taken MOM for 2 days and has not had  a bowel movement    . Vitamins A & D (VITAMIN A & D) ointment Apply A&D ointment to bilateral feet and toes BID & PRN     No current facility-administered medications on file prior to visit.     Review of Systems    General no complaints of fever or chills.   does not complain of rashes or itching does have a history of superficial skin cancers that are followedand removed by dermatology  Ear nose mouth and throat does not complaining of any visual changes or sore throat dysphagia apparently has improved does not complain of  today his appetite appears to be good  . Respiratory r does not complain of shortness breath or cough.  Cardiac no chest pain scant  lower extremity edema.  GU has an indwelling Foley catheter does not complain of dysuria.  GI is not complaining of abdominal pain nausea vomiting diarrhea or constipation.  Muscle skeletal has complained of some left hip and knee pain in the past but this apparently has improved with Voltaren gel.-- At one point had complain of foot numbness but denies that today  Neurologic is not complaining of dizziness headache -has apparently intermittent numbness of  the right foot but is not complaining of any of that today  Psych does have a history of depression this is been quite stable for some time appears to be in good spirits quite interactive conversant which is usually his baseline         Immunization History  Administered Date(s) Administered  . Influenza,inj,Quad PF,36+ Mos 12/07/2014  . Influenza-Unspecified 11/20/2013, 11/13/2015  . PPD Test 03/16/2013  . Pneumococcal-Unspecified 02/14/2009, 11/25/2015   Pertinent  Health Maintenance Due  Topic Date Due  . PNA vac Low Risk Adult (2 of 2 - PCV13) 11/24/2016  . INFLUENZA VACCINE  Completed   No flowsheet data found. Functional Status Survey:    Vitals:   12/17/15 1401  BP: 123/63  Pulse: 66  Resp: 20  Temp: 97.3 F (36.3 C)  TempSrc: Oral  SpO2: 100%  Weight is 168.2  Physical Exam  Gen. this is a pleasant elderly male in no distress sitting in his wheelchair  Skin is warm and I does have numerous solar induced changes diffuse he has been followed closely by dermatology    Oropharynx clear mucous membranes moist.  Chest is clear to auscultation with shallow air entry no labored breathing.  Heart sounds  largely regular rate and rhythm with occasional irregular beats he has scant trace lower extremity edema.  Abdomen continues to have a right-sided  hernia which appears unchanged from previous exams his abdomen continues to be protuberant but soft and nontender he does have a well-healed surgical scar-bowel sounds are positive  Muscle skeletal general frailty is able to walk extremities 4 limited range of motion with arthritic changes of his lower extremities knee area-do not note any acute deformities. Appears to be ambulating well in his wheelchair today Right foot appears to be baseline with previous exams there is no tenderness to palpation erythema or noted deformity  Left foot appears at baseline as well no sign of infection or erythema   he does have arthritic changes of his toes.  Neurologic is grossly intact to speech is clear no lateralizing findings.- Touch sensation appears to be intact lower extremities bilaterally as well as feet  Psych continues to be largely alert and oriented pleasant and appropriate Actually had approached the nurses station today asking for batteries for his headphones     Labs reviewed:  Recent Labs  08/20/15 0735 09/14/15 1035 10/14/15 0750  NA 137 137 138  K 4.6 4.3 4.8  CL 103 102 102  CO2 30 26 30   GLUCOSE 97 115* 98  BUN 21* 26* 20  CREATININE 1.10 1.24 0.99  CALCIUM 8.4* 9.0 8.4*    Recent Labs  04/28/15 1655 04/29/15 0448 10/14/15 0750  AST 24 18 18   ALT 15* 12* 13*  ALKPHOS 94 77 104  BILITOT 0.7 0.7 0.4  PROT 7.2 6.3* 6.8  ALBUMIN 3.6 3.0* 3.4*    Recent Labs  04/28/15 1655  08/24/15 0530 09/14/15 1035 10/14/15 0750  WBC 10.4  < > 8.4 10.6* 9.3  NEUTROABS 8.3*  --   --  7.1 6.3  HGB 11.7*  < > 10.0* 11.9* 11.3*  HCT 35.2*  < > 30.9* 37.0* 35.0*  MCV 86.9  < > 87.0 88.3 88.8  PLT 232  < > 157 217 190  < > = values in this interval not displayed. No results found for: TSH Lab Results  Component Value Date   HGBA1C 6.1 (H) 11/12/2014   No results found for: CHOL, HDL, LDLCALC, LDLDIRECT,  TRIG, CHOLHDL  Significant Diagnostic Results in last  30 days:  No results found.  Assessment/Plan     1 history of dizziness-this has apparently been a one time instance no reoccurrence lab work was unremarkable as well as EKG-Lasix and Flomax were discontinued appears to be doing well despite being off the Lasix and Flomax  #2 history of anemia with likely an element of chronic disease this is been stable with a hemoglobin of 11.9 on most recent lab in late August occult blood testing has been negative will update CBC.  #3 history of osteoarthritis he is doing well with topical Voltaren gel-he also has Vicodin every 6 hours when necessary as well as twice a day routinely appears to be tolerating this well.  #4 history of dysphagia this is been a significant issue in the past but this has stabilized on Prilosec again he has had hospitalization in GI follow-up on this chart to have age-related dysmotility possible some stricture activity. Try to avoid Cipro and doxycycline combination since this appears to aggravate the dysmotility  #5-history of osteomyelitis this is been stable for some time in the past as been treated with doxycycline and Cipro again does not do well with this in regards to his dysphagia we've been trying to avoid this combination. His feet look unremarkable at this time  #6-history of pneumonia no recent evidence of this he did require hospitalization at one point with an elevated white count.  #7 history of TIAs- does not really have any residual effects of this he is on aspirin.  #8 history of BPH-he appears to be doing well with this despite being off the Flomax-he does have a chronic indwelling Foley catheter no recent UTIs which is encouraging.  #9 history of restless legs this is been stable on Requip.  #10 history of depression this is stable as noted above on Zoloft.  #11 listed history of hypertension currently on no medications nonetheless as noted above his blood pressure appears to be stable. Recent  blood pressures 123/63-106/54-109/50  #12 history of CHF this is stable as well no evidence of edema or increased congestion. He has gained about 5 pounds since late August however I suspect this is due to eating better with dysmotility well controlled at this point I do not see any evidence of edema or increased chest congestion  A #13 history of numbness  This appears stabilized he is not complaining of numbness today and has not been some time Would be a poor candidate for any aggressive intervention.  #14 history of renal insufficiency  Was creatinine of 0.99 BUN of 20 on lab done in late August will update this-this appears to have been stable for some time as well  #14 history of basal cell carcinoma he is followed closely by dermatology at this point appears to be stable.  [id number] note greater than 35 minutes spent assessing patient-reviewing his chart review his labs discussing his status with nursing staff and coordinating and formulating a plan of care for numerous diagnoses-more than 50% of time spent coordinating plan of care  .

## 2015-12-18 ENCOUNTER — Encounter (HOSPITAL_COMMUNITY)
Admission: RE | Admit: 2015-12-18 | Discharge: 2015-12-18 | Disposition: A | Discharge status: Home / Self Care | Payer: Medicare Other | Source: Skilled Nursing Facility | Attending: Internal Medicine | Admitting: Internal Medicine

## 2015-12-18 DIAGNOSIS — I259 Chronic ischemic heart disease, unspecified: Secondary | ICD-10-CM | POA: Insufficient documentation

## 2015-12-18 LAB — BASIC METABOLIC PANEL
Anion gap: 7 (ref 5–15)
BUN: 17 mg/dL (ref 6–20)
CALCIUM: 8.4 mg/dL — AB (ref 8.9–10.3)
CHLORIDE: 102 mmol/L (ref 101–111)
CO2: 26 mmol/L (ref 22–32)
CREATININE: 1.12 mg/dL (ref 0.61–1.24)
GFR, EST NON AFRICAN AMERICAN: 52 mL/min — AB (ref >60)
Glucose, Bld: 94 mg/dL (ref 65–99)
Potassium: 4.5 mmol/L (ref 3.5–5.1)
SODIUM: 135 mmol/L (ref 135–145)

## 2015-12-18 LAB — CBC
HCT: 35.9 % — ABNORMAL LOW (ref 39.0–52.0)
HEMOGLOBIN: 11.8 g/dL — AB (ref 13.0–17.0)
MCH: 28.6 pg (ref 26.0–34.0)
MCHC: 32.9 g/dL (ref 30.0–36.0)
MCV: 87.1 fL (ref 78.0–100.0)
PLATELETS: 183 10*3/uL (ref 150–400)
RBC: 4.12 MIL/uL — ABNORMAL LOW (ref 4.22–5.81)
RDW: 14.3 % (ref 11.5–15.5)
WBC: 8.2 10*3/uL (ref 4.0–10.5)

## 2015-12-21 ENCOUNTER — Encounter: Payer: Self-pay | Admitting: Internal Medicine

## 2015-12-21 ENCOUNTER — Non-Acute Institutional Stay (SKILLED_NURSING_FACILITY): Payer: Medicare Other | Admitting: Internal Medicine

## 2015-12-21 DIAGNOSIS — J069 Acute upper respiratory infection, unspecified: Secondary | ICD-10-CM | POA: Diagnosis not present

## 2015-12-21 NOTE — Progress Notes (Signed)
Location:   [facility] Room Number: 139/W Place of Service:  SNF (31) Provider:  Anjali,Gupta  No primary care provider on file.  Patient Care Team: Danie Binder, MD as Consulting Physician (Gastroenterology) [name], MD as Consulting Physician [facility])  Extended Emergency Contact Information Primary Emergency Contact: Black,Shirley Address: [address]          [place], [place] [address] [name] of [place] Phone: [phone] Mobile Phone: [phone] Relation: Daughter  Code Status:  DNR Goals of care: Advanced Directive information Advanced Directives 12/21/2015  Does patient have an advance directive? Yes  Type of Advance Directive Out of facility DNR (pink MOST or yellow form)  Does patient want to make changes to advanced directive? No - Patient declined  Copy of advanced directive(s) in chart? Yes  Pre-existing out of facility DNR order (yellow form or pink MOST form) -     Chief Complaint  Patient presents with  . Acute Visit    Congestion    HPI:  Pt is a 82 y.o. male seen today for an acute visit for Cough, Low grade fever, and runny nose. Patient has h/o arthritis, BPH, CHF, H/o TIA and restless leg syndrome. He was seen today as he c/o cough with Yellow sputum, Runny nose. No fever or chills. No SOB or Chest pain. No Nausea or vomiting. Throat hurts due to cough.   Past Medical History:  Diagnosis Date  . Anxiety   . Bilateral foot pain   . CHF (congestive heart failure) ([place])   . Diverticulitis   . DJD (degenerative joint disease), cervical   . DNR (do not resuscitate)   . Esophageal dysmotility    age related per BPE 04/2015  . Esophageal stricture   . GERD (gastroesophageal reflux disease)   . Gout   . Hiatal hernia   . History of recurrent TIAs   . HTN (hypertension)   . Hyperlipidemia   . Inguinal hernia   . Ischemic heart disease   . Kidney stone   . Osteoarthritis    bilat knees  . Pleural  effusion   . Reflux   . Restless leg syndrome   . Right hip pain   . Skin cancer, basal cell   . Urinary retention    Past Surgical History:  Procedure Laterality Date  . ESOPHAGEAL DILATION    . EXPLORATORY LAPAROTOMY W/ BOWEL RESECTION    . FRACTURE SURGERY    . HEMIARTHROPLASTY HIP     Dr. Aline Brochure  . intestines      Allergies  Allergen Reactions  . Celebrex [Celecoxib] Other (See Comments)    Unknown; patient can't remember  . Codeine Other (See Comments)    nausea    Current Outpatient Prescriptions on File Prior to Visit  Medication Sig Dispense Refill  . aspirin EC 81 MG tablet Take 81 mg by mouth daily.    . Cholecalciferol (VITAMIN D) 2000 UNITS CAPS Take 1 capsule by mouth daily.    . diclofenac sodium (VOLTAREN) 1 % GEL Apply 2 grams to left hip and left knee q 6 hours PRN for pain    . docusate sodium (COLACE) 100 MG capsule Take 100 mg by mouth daily. For constipation.    . magnesium hydroxide (MILK OF MAGNESIA) 400 MG/5ML suspension Take 30 mLs by mouth daily as needed for mild constipation. If no relief try a Fleets enema, after 2 days.    . Melatonin 3 MG CAPS Take  3 mg by mouth at bedtime as needed (for sleep).    . NON FORMULARY Magic Cup once a day    . omeprazole (PRILOSEC) 20 MG capsule Take 20 mg by mouth daily.    Marland Kitchen rOPINIRole (REQUIP) 3 MG tablet Take one tablet by mouth at bedtime    . sertraline (ZOLOFT) 50 MG tablet Take 50 mg by mouth at bedtime.     . sodium phosphate (FLEET) enema Place 1 enema rectally once. follow package directions give if resident has taken MOM for 2 days and has not had  a bowel movement    . Vitamins A & D (VITAMIN A & D) ointment Apply A&D ointment to bilateral feet and toes BID & PRN     No current facility-administered medications on file prior to visit.      Review of Systems  Constitutional: Negative for activity change, appetite change, chills, diaphoresis, fatigue, fever and unexpected weight change.  HENT:  Positive for congestion, postnasal drip, rhinorrhea and sore throat. Negative for ear discharge, ear pain, facial swelling, hearing loss, sinus pain and sinus pressure.   Respiratory: Negative for apnea, cough, choking, chest tightness, shortness of breath, wheezing and stridor.   Cardiovascular: Negative for chest pain and leg swelling.  Gastrointestinal: Negative for abdominal distention, abdominal pain, anal bleeding, blood in stool, constipation, diarrhea, nausea, rectal pain and vomiting.  Musculoskeletal: Negative.  Negative for arthralgias, back pain, gait problem, joint swelling, myalgias, neck pain and neck stiffness.    Immunization History  Administered Date(s) Administered  . Influenza,inj,Quad PF,36+ Mos 12/07/2014  . Influenza-Unspecified 11/20/2013, 11/13/2015  . PPD Test 03/16/2013  . Pneumococcal-Unspecified 02/14/2009, 11/25/2015   Pertinent  Health Maintenance Due  Topic Date Due  . PNA vac Low Risk Adult (2 of 2 - PCV13) 11/24/2016  . INFLUENZA VACCINE  Completed   No flowsheet data found. Functional Status Survey:    Vitals:   12/21/15 1036  BP: (!) 138/55  Pulse: 82  Resp: 20  Temp: 99.8 F (37.7 C)  TempSrc: Oral  SpO2: 96%   There is no height or weight on file to calculate BMI. Physical Exam  Constitutional: He appears well-developed and well-nourished.  HENT:  Mouth/Throat: Posterior oropharyngeal erythema present.  Cardiovascular: Normal rate, regular rhythm and normal heart sounds.   No murmur heard. Pulmonary/Chest: Breath sounds normal. No respiratory distress. He has no wheezes. He has no rales. He exhibits no tenderness.  Abdominal: Soft. Bowel sounds are normal. There is no tenderness. There is no rebound and no guarding.  Musculoskeletal: He exhibits no edema.    Labs reviewed:  Recent Labs  09/14/15 1035 10/14/15 0750 12/18/15 0700  NA 137 138 135  K 4.3 4.8 4.5  CL 102 102 102  CO2 26 30 26   GLUCOSE 115* 98 94  BUN 26* 20 17   CREATININE 1.24 0.99 1.12  CALCIUM 9.0 8.4* 8.4*    Recent Labs  04/28/15 1655 04/29/15 0448 10/14/15 0750  AST 24 18 18   ALT 15* 12* 13*  ALKPHOS 94 77 104  BILITOT 0.7 0.7 0.4  PROT 7.2 6.3* 6.8  ALBUMIN 3.6 3.0* 3.4*    Recent Labs  04/28/15 1655  09/14/15 1035 10/14/15 0750 12/18/15 0700  WBC 10.4  < > 10.6* 9.3 8.2  NEUTROABS 8.3*  --  7.1 6.3  --   HGB 11.7*  < > 11.9* 11.3* 11.8*  HCT 35.2*  < > 37.0* 35.0* 35.9*  MCV 86.9  < > 88.3  88.8 87.1  PLT 232  < > 217 190 183  < > = values in this interval not displayed. No results found for: TSH Lab Results  Component Value Date   HGBA1C 6.1 (H) 11/12/2014   No results found for: CHOL, HDL, LDLCALC, LDLDIRECT, TRIG, CHOLHDL  Significant Diagnostic Results in last 30 days:  No results found.  Assessment/Plan  Upper respiratory tract infection  Patient has Low grade fever. Labs were reviewed and patient does not have any increase white amount. Do not think he needs C Xray right now.  Will startt him on Zithromax Continue Tussin for Cough. Start on Claritin. Mucinex prn    Family/ staff Communication:   Labs/tests ordered:

## 2016-02-11 DIAGNOSIS — B351 Tinea unguium: Secondary | ICD-10-CM | POA: Diagnosis not present

## 2016-02-11 DIAGNOSIS — I739 Peripheral vascular disease, unspecified: Secondary | ICD-10-CM | POA: Diagnosis not present

## 2016-02-11 DIAGNOSIS — I70203 Unspecified atherosclerosis of native arteries of extremities, bilateral legs: Secondary | ICD-10-CM | POA: Diagnosis not present

## 2016-02-11 DIAGNOSIS — M79674 Pain in right toe(s): Secondary | ICD-10-CM | POA: Diagnosis not present

## 2016-02-22 ENCOUNTER — Other Ambulatory Visit: Payer: Self-pay | Admitting: *Deleted

## 2016-02-22 MED ORDER — HYDROCODONE-ACETAMINOPHEN 5-325 MG PO TABS: ORAL_TABLET | ORAL | 0 refills | Status: DC

## 2016-02-22 NOTE — Telephone Encounter (Signed)
[facility] #[phone] Fax: [phone]   

## 2016-03-01 ENCOUNTER — Encounter: Payer: Self-pay | Admitting: Internal Medicine

## 2016-03-01 NOTE — Progress Notes (Signed)
Location:   [facility] Room Number: 139/W Place of Service:  SNF (31) Provider:  [place] Lewis  No primary care provider on file.  Patient Care Team: Danie Binder, MD as Consulting Physician (Gastroenterology) [name], MD as Consulting Physician [facility])  Extended Emergency Contact Information Primary Emergency Contact: Black,Shirley Address: [address]          [place], [place] [address] [name] of [place] Phone: [phone] Mobile Phone: [phone] Relation: Daughter  Code Status:  DNR Goals of care: Advanced Directive information Advanced Directives 03/01/2016  Does Patient Have a Medical Advance Directive? Yes  Type of Advance Directive Out of facility DNR (pink MOST or yellow form)  Does patient want to make changes to medical advance directive? No - Patient declined  Copy of [place] in Chart? -  Pre-existing out of facility DNR order (yellow form or pink MOST form) -     Chief Complaint  Patient presents with  . Acute Visit    Requesting dose increase on Requip    HPI:  Pt is a 83 y.o. male seen today for an acute visit for    Past Medical History:  Diagnosis Date  . Anxiety   . Bilateral foot pain   . CHF (congestive heart failure) ([place])   . Diverticulitis   . DJD (degenerative joint disease), cervical   . DNR (do not resuscitate)   . Esophageal dysmotility    age related per BPE 04/2015  . Esophageal stricture   . GERD (gastroesophageal reflux disease)   . Gout   . Hiatal hernia   . History of recurrent TIAs   . HTN (hypertension)   . Hyperlipidemia   . Inguinal hernia   . Ischemic heart disease   . Kidney stone   . Osteoarthritis    bilat knees  . Pleural effusion   . Reflux   . Restless leg syndrome   . Right hip pain   . Skin cancer, basal cell   . Urinary retention    Past Surgical History:  Procedure Laterality Date  . ESOPHAGEAL DILATION    . EXPLORATORY LAPAROTOMY W/  BOWEL RESECTION    . FRACTURE SURGERY    . HEMIARTHROPLASTY HIP     Dr. Aline Brochure  . intestines      Allergies  Allergen Reactions  . Celebrex [Celecoxib] Other (See Comments)    Unknown; patient can't remember  . Codeine Other (See Comments)    nausea    Current Outpatient Prescriptions on File Prior to Visit  Medication Sig Dispense Refill  . aspirin EC 81 MG tablet Take 81 mg by mouth daily.    . Cholecalciferol (VITAMIN D) 2000 UNITS CAPS Take 1 capsule by mouth daily.    . diclofenac sodium (VOLTAREN) 1 % GEL Apply 2 grams to left hip and left knee q 6 hours PRN for pain    . docusate sodium (COLACE) 100 MG capsule Take 100 mg by mouth daily. For constipation.    Marland Kitchen guaifenesin (ROBITUSSIN) 100 MG/5ML syrup 15 ml by mouth as needed every 6 hours prn    . HYDROcodone-acetaminophen (NORCO/VICODIN) 5-325 MG tablet Take one tablet by mouth twice daily and every 6 hours as needed for pain. Max APAP 3gm/24 hours from all sources 180 tablet 0  . magnesium hydroxide (MILK OF MAGNESIA) 400 MG/5ML suspension Take 30 mLs by mouth daily as needed for mild constipation. If no relief try a Fleets enema, after  2 days.    . Melatonin 3 MG CAPS Take 3 mg by mouth at bedtime as needed (for sleep).    . NON FORMULARY Magic Cup once a day    . omeprazole (PRILOSEC) 20 MG capsule Take 20 mg by mouth daily.    Marland Kitchen rOPINIRole (REQUIP) 3 MG tablet Take one tablet by mouth at bedtime    . sertraline (ZOLOFT) 50 MG tablet Take 50 mg by mouth at bedtime.     . Vitamins A & D (VITAMIN A & D) ointment Apply A&D ointment to bilateral feet and toes BID & PRN     No current facility-administered medications on file prior to visit.      Review of Systems  Immunization History  Administered Date(s) Administered  . Influenza,inj,Quad PF,36+ Mos 12/07/2014  . Influenza-Unspecified 11/20/2013, 11/13/2015  . PPD Test 03/16/2013  . Pneumococcal-Unspecified 02/14/2009, 11/25/2015   Pertinent  Health Maintenance  Due  Topic Date Due  . PNA vac Low Risk Adult (2 of 2 - PCV13) 11/24/2016  . INFLUENZA VACCINE  Completed   No flowsheet data found. Functional Status Survey:    Vitals:   03/01/16 1530  BP: (!) 101/50  Pulse: 66  Resp: 20  Temp: 97.7 F (36.5 C)  TempSrc: Oral  SpO2: 100%  Weight: 177 lb 6.4 oz (80.5 kg)  Height: 6' (1.829 m)   Body mass index is 24.06 kg/m. Physical Exam  Labs reviewed:  Recent Labs  09/14/15 1035 10/14/15 0750 12/18/15 0700  NA 137 138 135  K 4.3 4.8 4.5  CL 102 102 102  CO2 26 30 26   GLUCOSE 115* 98 94  BUN 26* 20 17  CREATININE 1.24 0.99 1.12  CALCIUM 9.0 8.4* 8.4*    Recent Labs  04/28/15 1655 04/29/15 0448 10/14/15 0750  AST 24 18 18   ALT 15* 12* 13*  ALKPHOS 94 77 104  BILITOT 0.7 0.7 0.4  PROT 7.2 6.3* 6.8  ALBUMIN 3.6 3.0* 3.4*    Recent Labs  04/28/15 1655  09/14/15 1035 10/14/15 0750 12/18/15 0700  WBC 10.4  < > 10.6* 9.3 8.2  NEUTROABS 8.3*  --  7.1 6.3  --   HGB 11.7*  < > 11.9* 11.3* 11.8*  HCT 35.2*  < > 37.0* 35.0* 35.9*  MCV 86.9  < > 88.3 88.8 87.1  PLT 232  < > 217 190 183  < > = values in this interval not displayed. No results found for: TSH Lab Results  Component Value Date   HGBA1C 6.1 (H) 11/12/2014   No results found for: CHOL, HDL, LDLCALC, LDLDIRECT, TRIG, CHOLHDL  Significant Diagnostic Results in last 30 days:  No results found.  Assessment/Plan There are no diagnoses linked to this encounter.      Oralia Manis, [place] [phone]   This encounter was created in error - please disregard.

## 2016-03-11 ENCOUNTER — Encounter: Payer: Self-pay | Admitting: Internal Medicine

## 2016-03-11 ENCOUNTER — Non-Acute Institutional Stay (SKILLED_NURSING_FACILITY): Payer: Medicare Other | Admitting: Internal Medicine

## 2016-03-11 DIAGNOSIS — I5031 Acute diastolic (congestive) heart failure: Secondary | ICD-10-CM | POA: Diagnosis not present

## 2016-03-11 DIAGNOSIS — D509 Iron deficiency anemia, unspecified: Secondary | ICD-10-CM | POA: Diagnosis not present

## 2016-03-11 DIAGNOSIS — F329 Major depressive disorder, single episode, unspecified: Secondary | ICD-10-CM | POA: Diagnosis not present

## 2016-03-11 DIAGNOSIS — Z8739 Personal history of other diseases of the musculoskeletal system and connective tissue: Secondary | ICD-10-CM

## 2016-03-11 DIAGNOSIS — Z85828 Personal history of other malignant neoplasm of skin: Secondary | ICD-10-CM

## 2016-03-11 DIAGNOSIS — G2581 Restless legs syndrome: Secondary | ICD-10-CM

## 2016-03-11 DIAGNOSIS — F32A Depression, unspecified: Secondary | ICD-10-CM

## 2016-03-11 DIAGNOSIS — R1310 Dysphagia, unspecified: Secondary | ICD-10-CM

## 2016-03-11 NOTE — Progress Notes (Signed)
Location:   [facility] Room Number: 139/W Place of Service:  SNF (31) Provider:  Phuoc Huy,Kiefer Opheim  No primary care provider on file.  Patient Care Team: Danie Binder, MD as Consulting Physician (Gastroenterology) [name], MD as Consulting Physician [facility])  Extended Emergency Contact Information Primary Emergency Contact: Black,Patrick [name] Address: [address]          [place], [place] [address] [name] of [place] Phone: [phone] Mobile Phone: [phone] Relation: Daughter  Code Status:  DNR Goals of care: Advanced Directive information Advanced Directives 03/11/2016  Does Patient Have a Medical Advance Directive? Yes  Type of Advance Directive Out of facility DNR (pink MOST or yellow form)  Does patient want to make changes to medical advance directive? No - Patient declined  Copy of [place] in Chart? -  Pre-existing out of facility DNR order (yellow form or pink MOST form) -     Chief Complaint  Patient presents with  . Medical Management of Chronic Issues    Routine Visit  . Acute Visit    Left foot toe red tender to touch swelling  Medical management of chronic medical conditions including arthritis-history of BPH-CHF-history TIAs-versus leg syndrome-depression-dysphagia  HPI:  Pt is a 83 y.o. Patrick [name] seen today for medical management of chronic diseases as noted above.  He continues to be quite stable which is encouraging he's actually gained about 5 pounds over the past 1 or 2 months this appears to be more appetite related I do not see increased edema and there is no complaint of shortness of breath-if anything he appears a bit stronger than when I last saw him.  He is complaining of some left great toe pain apparently there's some been some erythema here as well.  He does have a history of gout.  Regards to other issues appears to be stable he has seen dermatology for various skin issues and actually had a  lesion removed from his left ear recently and this appears to have healed unremarkably he would like another reassessment by dermatology which I feel is a good idea secondary to numerous solar induced changes- --  At one point he was complaining of dizziness his Lasix and Flomax were discontinued and this apparently has resolved he is not complaining of dizziness recently.  Is also thought to have some age-related dysmotility and actually was hospitalized at one point because he had difficulty swallowing-he is on a proton pump inhibitor and this apparently has not been an issue in a while there is recommendation to avoid a combination of Cipro and doxycycline since this has led in the past 2 dysphagia issues.  Regards to a history CHF this appears well controlled despite being off her diuretic-it appears his weight gain has been largely appetite related and not edema related.  Also has a history of osteomyelitis of his left foot but this is been stable for some time the redness today appears confined more to the left great toe he does have a well-healed scab under the toe but it does not really appear to be cellulitic.  He does have a history previously of pneumonia with an elevated white count but this did respond to antibiotics he actually was treated back in November 2017 for suspected URI with Zithromax and this appeared to have resolved unremarkably.  He does have a history of restless legs does receive Requip and apparently this has been stable.  For pain he is receiving Vicodin twice  a day as well as every 6 hours when necessary also has full pterin gel.  Currently he is sitting in his chair comfortably appears to be in quite good spirits has no complaints other than his left great toe pain and also would like to see the dermatologist  .      Past Medical History:  Diagnosis Date  . Anxiety   . Bilateral foot pain   . CHF (congestive heart failure) ([place])   . Diverticulitis   . DJD  (degenerative joint disease), cervical   . DNR (do not resuscitate)   . Esophageal dysmotility    age related per BPE 04/2015  . Esophageal stricture   . GERD (gastroesophageal reflux disease)   . Gout   . Hiatal hernia   . History of recurrent TIAs   . HTN (hypertension)   . Hyperlipidemia   . Inguinal hernia   . Ischemic heart disease   . Kidney stone   . Osteoarthritis    bilat knees  . Pleural effusion   . Reflux   . Restless leg syndrome   . Right hip pain   . Skin cancer, basal cell   . Urinary retention    Past Surgical History:  Procedure Laterality Date  . ESOPHAGEAL DILATION    . EXPLORATORY LAPAROTOMY W/ BOWEL RESECTION    . FRACTURE SURGERY    . HEMIARTHROPLASTY HIP     Dr. Aline Brochure  . intestines      Allergies  Allergen Reactions  . Celebrex [Celecoxib] Other (See Comments)    Unknown; patient can't remember  . Codeine Other (See Comments)    nausea    Current Outpatient Prescriptions on File Prior to Visit  Medication Sig Dispense Refill  . aspirin EC 81 MG tablet Take 81 mg by mouth daily.    Marland Kitchen CAMPHOR-EUCALYPTUS-MENTHOL EX Vicks vapor rub apply to the chest bid prn twice a day    . Cholecalciferol (VITAMIN D) 2000 UNITS CAPS Take 1 capsule by mouth daily.    . diclofenac sodium (VOLTAREN) 1 % GEL Apply 2 grams to left hip and left knee q 6 hours PRN for pain    . docusate sodium (COLACE) 100 MG capsule Take 100 mg by mouth daily. For constipation.    Marland Kitchen guaifenesin (ROBITUSSIN) 100 MG/5ML syrup 15 ml by mouth as needed every 6 hours prn    . HYDROcodone-acetaminophen (NORCO/VICODIN) 5-325 MG tablet Take one tablet by mouth twice daily and every 6 hours as needed for pain. Max APAP 3gm/24 hours from all sources 180 tablet 0  . magnesium hydroxide (MILK OF MAGNESIA) 400 MG/5ML suspension Take 30 mLs by mouth daily as needed for mild constipation. If no relief try a Fleets enema, after 2 days.    . Melatonin 3 MG CAPS Take 3 mg by mouth at bedtime as  needed (for sleep).    . Menthol 4.8 MG LOZG Use as directed 4.8 mg in the mouth or throat every 4 (four) hours as needed.    . NON FORMULARY Magic Cup once a day    . omeprazole (PRILOSEC) 20 MG capsule Take 20 mg by mouth daily.    . sertraline (ZOLOFT) 50 MG tablet Take 50 mg by mouth at bedtime.     . Vitamins A & D (VITAMIN A & D) ointment Apply A&D ointment to bilateral feet and toes BID & PRN     No current facility-administered medications on file prior to visit.  Review of Systems   General no complaints of fever or chills.   does not complain of rashes or itching does have a history of superficial skin cancers that are followedand removedby dermatology--says his left great toe is a bit red today and tender  Ear nose mouth and throat does not complaining of any visual changes or sore throat dysphagia apparently has improved does not complain of today his appetite appears to be pretty good   . Respiratory r does not complain of shortness breath or cough.  Cardiac no chest pain scant  lower extremity edema somewhat more pronounced on the left versus the right which is not new.  GU has an indwelling Foley catheter does not complain of dysuria.  GI is not complaining of abdominal pain nausea vomiting diarrhea or constipation.  Muscle skeletal has complained of some left hip and knee pain in the past but this apparently has improved with Voltaren gel.-- At one point had complain of foot numbness but does not complain of that today he is complaining somewhat of left great toe pain as noted above  Neurologic is not complaining of dizziness headache or numbness   Psych does have a history of depression this is been quite stable for some time appears to be in good spirits quite interactive conversantwhich is usually his baseline   Immunization History  Administered Date(s) Administered  . Influenza,inj,Quad PF,36+ Mos 12/07/2014  . Influenza-Unspecified  11/20/2013, 11/13/2015  . PPD Test 03/16/2013  . Pneumococcal-Unspecified 02/14/2009, 11/25/2015   Pertinent  Health Maintenance Due  Topic Date Due  . PNA vac Low Risk Adult (2 of 2 - PCV13) 11/24/2016  . INFLUENZA VACCINE  Completed   No flowsheet data found. Functional Status Survey:    Vitals:   03/11/16 1029  BP: 123/67  Pulse: 69  Resp: 18  Temp: 97.9 F (36.6 C)  TempSrc: Oral   Weight is 177 pounds this appears to be a gain of about 5 pounds over the past month about 10 pounds since August  Physical Exam Gen. this is a pleasant elderly Patrick [name] in no distress sitting in his wheelchair  Skin is warm and I does have numerous solar induced changes diffuse he has been followed closely by dermatology--skin lesion on left ear appears largely resolved minimal amount of crusting    Oropharynx clear mucous membranes moist.  Chest is clear to auscultation with shallow air entry no labored breathing.  Heart sounds  largely regular rate and rhythm with occasional irregular beats he has scant trace lower extremity edema. On the right upper more on the left but this does not appear to be new  Abdomen continues to have a right-sided hernia which appears unchanged from previous exams his abdomen continues to be protuberant but soft and nontender he does have a well-healed surgical scar-bowel sounds are positive  Muscle skeletal general frailty is able to walk extremities 4 limited range of motion with arthritic changes of his lower extremities knee area-do not note any acute deformities. Appears to be ambulating well in his wheelchair today Right foot appears to be baseline with previous exams there is no tenderness to palpation erythema or noted deformity  Left foot-great toe does have some erythema and tenderness to his left great toe possibly some very mild edema also has a well-healed scab on the bottom of the distal aspect of the left great toe-appears to have some venous  stasis color changes more the distal aspects of the feet bilaterally  he does have arthritic changes of his toes.  Neurologic is grossly intact to speech is clear no lateralizing findings.- Touch sensation appears to be intact lower extremities bilaterally as well as feet  Psych continues to be largely alert and oriented pleasant and appropriate    Labs reviewed:  Recent Labs  07/31/Patrick 1035 08/30/Patrick 0750 11/03/Patrick 0700  NA 137 138 135  K 4.3 4.8 4.5  CL 102 102 102  CO2 26 30 26   GLUCOSE 115* 98 94  BUN 26* 20 Patrick  CREATININE 1.24 0.99 1.12  CALCIUM 9.0 8.4* 8.4*    Recent Labs  03/14/Patrick 1655 03/15/Patrick 0448 08/30/Patrick 0750  AST 24 18 18   ALT 15* 12* 13*  ALKPHOS 94 77 104  BILITOT 0.7 0.7 0.4  PROT 7.2 6.3* 6.8  ALBUMIN 3.6 3.0* 3.4*    Recent Labs  03/14/Patrick 1655  07/31/Patrick 1035 08/30/Patrick 0750 11/03/Patrick 0700  WBC 10.4  < > 10.6* 9.3 8.2  NEUTROABS 8.3*  --  7.1 6.3  --   HGB 11.7*  < > 11.9* 11.3* 11.8*  HCT 35.2*  < > 37.0* 35.0* 35.9*  MCV 86.9  < > 88.3 88.8 87.1  PLT 232  < > 217 190 183  < > = values in this interval not displayed. No results found for: TSH Lab Results  Component Value Date   HGBA1C 6.1 (H) 11/12/2014   No results found for: CHOL, HDL, LDLCALC, LDLDIRECT, TRIG, CHOLHDL  Significant Diagnostic Results in last 30 days:  No results found.  Assessment/Plan  :  1 history of dizziness-this has apparently been a one time instance no reoccurrence lab work was unremarkable as well as EKG-Lasix and Flomax were discontinued appears to be doing well despite being off the Lasix and Flomax  #2 history of anemia with likely an element of chronic disease this is been stable with a hemoglobin of 11.8  12/18/2015 will update this  #3 history of osteoarthritis he is doing well with topical Voltaren gel-he also has Vicodin every 6 hours when necessary as well as twice a day routinely appears to be tolerating this well.  #4 history of  dysphagia this is been a significant issue in the past but this has stabilized on Prilosec again he has had hospitalization --thought to have age-related dysmotility possible some stricture activity. Try to avoid Cipro and doxycycline combination since this appears to aggravate the dysmotility  #5-history of osteomyelitis this is been stable for some time in the past as been treated with doxycycline and Cipro again does not do well with this in regards to his dysphagia we've been trying to avoid this combination.   #6-history of pneumonia no recent evidence of this he did require hospitalization at one point with an elevated white count.--Appears to have made an unremarkable recovery from the URI back in November  #7 history of TIAs- does not really have any residual effects of this he is on aspirin.  #8 history of BPH-he appears to be doing well with this despite being off the Flomax-he does have a chronic indwelling Foley catheter no recent UTIs which is encouraging.  #9 history of restless legs this is been stable on Requip.  #10 history of depression this is stable as noted above on Zoloft.  #11 listed history of hypertension currently on no medications nonetheless as noted above his blood pressure appears to be stable. Recent blood pressures 109/74-101/80  #12 history of CHF this is stable as well no evidence of  edema or increased congestion. He has gained about 5 pounds since  November-December however I suspect this is due to eating better with dysmotility well controlled at this point I do not see any evidence of edema or increased chest congestion  A #13 history of numbness  This appears stabilized he is not complaining of numbness today and has not been some time Would be a poor candidate for any aggressive intervention.  #14 history of renal insufficiency  Was creatinine of 0.99 BUN of 20 on lab done in late August will update this-this appears to have been stable for some  time as well  #14 history of basal cell carcinoma he is followed closely by dermatology at this point appears to be stable. But will order a dermatology follow-up since he has numerous solar induced changes.  #15 history of left great toe pain-with some erythema and possible edema-this appears to be somewhat of a gout presentation will start colchicine 0.6 mg twice a day for 3 days in order a uric acid tomorrow-note also will update CBC and metabolic panel.    [id number] note greater than 40 minutes spent assessing patient-reviewing his chart review his labs discussing his status with nursing staff--as well as with patient- and coordinating and formulating a plan of care for numerous diagnoses-more than 50% of time spent coordinating plan of care  .

## 2016-03-12 ENCOUNTER — Encounter (HOSPITAL_COMMUNITY)
Admission: RE | Admit: 2016-03-12 | Discharge: 2016-03-12 | Disposition: A | Discharge status: Home / Self Care | Payer: Medicare Other | Source: Skilled Nursing Facility | Attending: Pediatrics | Admitting: Pediatrics

## 2016-03-12 DIAGNOSIS — M1000 Idiopathic gout, unspecified site: Secondary | ICD-10-CM | POA: Diagnosis not present

## 2016-03-12 LAB — BASIC METABOLIC PANEL
Anion gap: 9 (ref 5–15)
BUN: 13 mg/dL (ref 6–20)
CALCIUM: 8.8 mg/dL — AB (ref 8.9–10.3)
CO2: 25 mmol/L (ref 22–32)
CREATININE: 1.1 mg/dL (ref 0.61–1.24)
Chloride: 100 mmol/L — ABNORMAL LOW (ref 101–111)
GFR calc Af Amer: >60 mL/min (ref >60)
GFR, EST NON AFRICAN AMERICAN: 53 mL/min — AB (ref >60)
GLUCOSE: 93 mg/dL (ref 65–99)
POTASSIUM: 4.5 mmol/L (ref 3.5–5.1)
SODIUM: 134 mmol/L — AB (ref 135–145)

## 2016-03-12 LAB — CBC WITH DIFFERENTIAL/PLATELET
BASOS PCT: 0 %
Basophils Absolute: 0 10*3/uL (ref 0.0–0.1)
EOS PCT: 5 %
Eosinophils Absolute: 0.5 10*3/uL (ref 0.0–0.7)
HCT: 37 % — ABNORMAL LOW (ref 39.0–52.0)
Hemoglobin: 12.2 g/dL — ABNORMAL LOW (ref 13.0–17.0)
Lymphocytes Relative: 25 %
Lymphs Abs: 2.1 10*3/uL (ref 0.7–4.0)
MCH: 28.4 pg (ref 26.0–34.0)
MCHC: 33 g/dL (ref 30.0–36.0)
MCV: 86 fL (ref 78.0–100.0)
Monocytes Absolute: 1.3 10*3/uL — ABNORMAL HIGH (ref 0.1–1.0)
Monocytes Relative: 15 %
NEUTROS PCT: 55 %
Neutro Abs: 4.7 10*3/uL (ref 1.7–7.7)
PLATELETS: 220 10*3/uL (ref 150–400)
RBC: 4.3 MIL/uL (ref 4.22–5.81)
RDW: 14.5 % (ref 11.5–15.5)
WBC: 8.6 10*3/uL (ref 4.0–10.5)

## 2016-03-12 LAB — URIC ACID: URIC ACID, SERUM: 6.6 mg/dL (ref 4.4–7.6)

## 2016-03-14 ENCOUNTER — Encounter: Payer: Self-pay | Admitting: Internal Medicine

## 2016-03-14 ENCOUNTER — Non-Acute Institutional Stay (SKILLED_NURSING_FACILITY): Payer: Medicare Other | Admitting: Internal Medicine

## 2016-03-14 DIAGNOSIS — L03116 Cellulitis of left lower limb: Secondary | ICD-10-CM

## 2016-03-14 DIAGNOSIS — L02416 Cutaneous abscess of left lower limb: Secondary | ICD-10-CM

## 2016-03-14 NOTE — Progress Notes (Signed)
Location:   [place] Room Number: 139/W Place of Service:  SNF (31) Provider:  Granville Lewis  No primary care provider on file.  Patient Care Team: Danie Binder, MD as Consulting Physician (Gastroenterology) [name], MD as Consulting Physician [facility])  Extended Emergency Contact Information Primary Emergency Contact: Black,Shirley Address: [address]          [place], [place] [address] [name] of [place] Phone: [phone] Mobile Phone: [phone] Relation: Daughter  Code Status:  DNR Goals of care: Advanced Directive information Advanced Directives 03/14/2016  Does Patient Have a Medical Advance Directive? Yes  Type of Advance Directive Out of facility DNR (pink MOST or yellow form)  Does patient want to make changes to medical advance directive? No - Patient declined  Copy of [place] in Chart? -  Pre-existing out of facility DNR order (yellow form or pink MOST form) -     Chief Complaint  Patient presents with  . Acute Visit    Blister on left great toe    HPI:  Pt is a 83 y.o. male seen today for an acute visit for A blister on his left great toe-also follow-up of left great toe pain.  I saw patient last Friday for complaints of left great toe pain at that point he had some very mild erythema confined to the toe and tenderness to palpation-with a history of gouty uric acid was ordered which came back high normal-at 6.6.  He also is completing a 3 day course of colchicine for suspected possible gout etiology.  He says the toe pain actually appears improved however nursing staff noted a blister on the lateral aspect of his left great toe this morning-and when I looked at it also had increased erythema that extended to the distal third of his foot  which is new from last Friday.  He is afebrile and again says the toe pain actually appears to be improved.  He does have a history of osteomyelitis of his  feet--which has responded to antibiotics the past-one point he had been on, a combination of Cipro and doxycycline but this led to actually significant dysphagia and actually required hospitalization recommendation is not to use these antibiotics especially in combination- Per chart review back in November 2015 he did have arterial study done of that showed no evidence arterial occlusive disease of the right lower extremity--left ABI could not be calculated because of noncompressible vessels at the ankle.  Segmental Doppler and segmental pulse or IM recording suggested arthrosclerotic disease of the femoral popliteal region as well as the bilateral tibial vessels.  He is on aspirin 81 mg a day-she does receive hydrocodone twice a day for pain as well as every 6 hours when necessary       Past Medical History:  Diagnosis Date  . Anxiety   . Bilateral foot pain   . CHF (congestive heart failure) ([place])   . Diverticulitis   . DJD (degenerative joint disease), cervical   . DNR (do not resuscitate)   . Esophageal dysmotility    age related per BPE 04/2015  . Esophageal stricture   . GERD (gastroesophageal reflux disease)   . Gout   . Hiatal hernia   . History of recurrent TIAs   . HTN (hypertension)   . Hyperlipidemia   . Inguinal hernia   . Ischemic heart disease   . Kidney stone   . Osteoarthritis    bilat knees  .  Pleural effusion   . Reflux   . Restless leg syndrome   . Right hip pain   . Skin cancer, basal cell   . Urinary retention    Past Surgical History:  Procedure Laterality Date  . ESOPHAGEAL DILATION    . EXPLORATORY LAPAROTOMY W/ BOWEL RESECTION    . FRACTURE SURGERY    . HEMIARTHROPLASTY HIP     Dr. Aline Brochure  . intestines      Allergies  Allergen Reactions  . Celebrex [Celecoxib] Other (See Comments)    Unknown; patient can't remember  . Codeine Other (See Comments)    nausea    Current Outpatient Prescriptions on File Prior to Visit  Medication Sig  Dispense Refill  . aspirin EC 81 MG tablet Take 81 mg by mouth daily.    Marland Kitchen CAMPHOR-EUCALYPTUS-MENTHOL EX Vicks vapor rub apply to the chest bid prn twice a day    . Cholecalciferol (VITAMIN D) 2000 UNITS CAPS Take 1 capsule by mouth daily.    . diclofenac sodium (VOLTAREN) 1 % GEL Apply 2 grams to left hip and left knee q 6 hours PRN for pain    . docusate sodium (COLACE) 100 MG capsule Take 100 mg by mouth daily. For constipation.    Marland Kitchen guaifenesin (ROBITUSSIN) 100 MG/5ML syrup 15 ml by mouth as needed every 6 hours prn    . HYDROcodone-acetaminophen (NORCO/VICODIN) 5-325 MG tablet Take one tablet by mouth twice daily and every 6 hours as needed for pain. Max APAP 3gm/24 hours from all sources 180 tablet 0  . magnesium hydroxide (MILK OF MAGNESIA) 400 MG/5ML suspension Take 30 mLs by mouth daily as needed for mild constipation. If no relief try a Fleets enema, after 2 days.    . Melatonin 3 MG CAPS Take 3 mg by mouth at bedtime as needed (for sleep).    . Menthol 4.8 MG LOZG Use as directed 4.8 mg in the mouth or throat every 4 (four) hours as needed.    . NON FORMULARY Magic Cup once a day    . omeprazole (PRILOSEC) 20 MG capsule Take 20 mg by mouth daily.    Marland Kitchen rOPINIRole (REQUIP) 2 MG tablet Take 2 mg by mouth at bedtime.    . sertraline (ZOLOFT) 50 MG tablet Take 50 mg by mouth at bedtime.     . Vitamins A & D (VITAMIN A & D) ointment Apply A&D ointment to bilateral feet and toes BID & PRN     No current facility-administered medications on file prior to visit.      Review of Systems General no complaints of fever or chills.   does not complain of rashes or itching does have a history of superficial skin cancers that are followedand removedby dermatology--blister and increased erythema have been noted of his left great toe and foot area today  Ear nose mouth and throat does not complaining of any visual changes or sore throat dysphagia apparently has improveddoes not complain of  today his appetite appears to be pretty good   . Respiratory r does not complain of shortness breath or cough.  Cardiac no chest pain lower extremity edema somewhat more pronounced on the left versus the right which is not new.  GU has an indwelling Foley catheter does not complain of dysuria.  GI is not complaining of abdominal pain nausea vomiting diarrhea or constipation.  Muscle skeletal has complained of some left hip and knee pain in the past but this apparently has improved  with Voltaren gel.--Says his left great toe feels better today   Neurologic is not complaining of dizziness headache or numbness   Psych does have a history of depression this is been quite stable for some time appears to be in good spirits quite interactive conversantwhich is usually his baseline Immunization History  Administered Date(s) Administered  . Influenza,inj,Quad PF,36+ Mos 12/07/2014  . Influenza-Unspecified 11/20/2013, 11/13/2015  . PPD Test 03/16/2013  . Pneumococcal-Unspecified 02/14/2009, 11/25/2015   Pertinent  Health Maintenance Due  Topic Date Due  . PNA vac Low Risk Adult (2 of 2 - PCV13) 11/24/2016  . INFLUENZA VACCINE  Completed   No flowsheet data found. Functional Status Survey:    Vitals:   03/14/16 1500  BP: 100/64  Pulse: 88  Resp: 20  Temp: 98 F (36.7 C)  TempSrc: Oral    Physical Exam   Gen. this is a pleasant elderly male in no distress sitting in his wheelchair  Skin is warm and I does have numerous solar induced changes diffusehe has been followed closely by dermatology-      Oropharynx clear mucous membranes moist.  Chest is clear to auscultation with shallow air entry no labored breathing.  Heart sounds largely regular rate and rhythm with occasional irregular beats he has some chronic left lower extremity edema which appears relatively baseline  Abdomen continues to have a right-sided hernia which appears unchanged from  previous exams his abdomen continues to be protuberant but soft and nontender he does have a well-healed surgical scar-bowel sounds are positive  Muscle skeletal general frailty is able to walk extremities 4 limited range of motion with arthritic changes of his lower extremities knee area-do not note any acute deformities.Appears to be ambulating well in his wheelchair today Left great toe has some erythema there is more pronounced erythema however of the distal left foot covering about a third of the upper foot this is somewhat warm to touch nontender he also has a blister on the lateral aspect of his left great toe - .  Neurologic is grossly intact to speech is clear no lateralizing findings.- Touch sensation appears to be intact lower extremities bilaterally as well as feet  Psych continues to be largely alert and oriented pleasant and appropriate  Labs reviewed:  Recent Labs  10/14/15 0750 12/18/15 0700 03/12/16 0720  NA 138 135 134*  K 4.8 4.5 4.5  CL 102 102 100*  CO2 30 26 25   GLUCOSE 98 94 93  BUN 20 17 13   CREATININE 0.99 1.12 1.10  CALCIUM 8.4* 8.4* 8.8*    Recent Labs  04/28/15 1655 04/29/15 0448 10/14/15 0750  AST 24 18 18   ALT 15* 12* 13*  ALKPHOS 94 77 104  BILITOT 0.7 0.7 0.4  PROT 7.2 6.3* 6.8  ALBUMIN 3.6 3.0* 3.4*    Recent Labs  09/14/15 1035 10/14/15 0750 12/18/15 0700 03/12/16 0720  WBC 10.6* 9.3 8.2 8.6  NEUTROABS 7.1 6.3  --  4.7  HGB 11.9* 11.3* 11.8* 12.2*  HCT 37.0* 35.0* 35.9* 37.0*  MCV 88.3 88.8 87.1 86.0  PLT 217 190 183 220   No results found for: TSH Lab Results  Component Value Date   HGBA1C 6.1 (H) 11/12/2014   No results found for: CHOL, HDL, LDLCALC, LDLDIRECT, TRIG, CHOLHDL  Significant Diagnostic Results in last 30 days:  No results found.  Assessment/Plan  #1-suspected left foot cellulitis-has not done well with doxycycline-Ciproin the past-secondary to increased dysphasia --he has been on Bactrim previously  apparently with no ill effects will start Bactrim DS twice a day for 10 days-I also assess the foot with the wound care nurse who will apply wrapping including an Ace wrap and  and close monitoring.  Also add a probiotic twice a day for 10 days.  Will check arterial Dopplers of the left leg as well also will get a venous Doppler has some edema although I do not believe this is greatly increased from baseline.  Also will check a metabolic panel later this week since he is on Bactrim.  Clinically appears to be stable he is afebrile actually says his foot feels better  today he has completed a course of colchicine for possible  gout etiology this will have to be monitored.  [id number]  [place], [place], [place]

## 2016-03-15 DIAGNOSIS — R600 Localized edema: Secondary | ICD-10-CM | POA: Diagnosis not present

## 2016-03-18 ENCOUNTER — Encounter (HOSPITAL_COMMUNITY)
Admission: RE | Admit: 2016-03-18 | Discharge: 2016-03-18 | Disposition: A | Discharge status: Home / Self Care | Payer: Medicare Other | Source: Skilled Nursing Facility | Attending: Internal Medicine | Admitting: Internal Medicine

## 2016-03-18 ENCOUNTER — Encounter: Payer: Self-pay | Admitting: Internal Medicine

## 2016-03-18 ENCOUNTER — Non-Acute Institutional Stay (SKILLED_NURSING_FACILITY): Payer: Medicare Other | Admitting: Internal Medicine

## 2016-03-18 DIAGNOSIS — N289 Disorder of kidney and ureter, unspecified: Secondary | ICD-10-CM | POA: Diagnosis not present

## 2016-03-18 DIAGNOSIS — L03116 Cellulitis of left lower limb: Secondary | ICD-10-CM | POA: Diagnosis not present

## 2016-03-18 DIAGNOSIS — I10 Essential (primary) hypertension: Secondary | ICD-10-CM | POA: Diagnosis not present

## 2016-03-18 LAB — BASIC METABOLIC PANEL
Anion gap: 7 (ref 5–15)
BUN: 13 mg/dL (ref 6–20)
CALCIUM: 8.6 mg/dL — AB (ref 8.9–10.3)
CHLORIDE: 102 mmol/L (ref 101–111)
CO2: 25 mmol/L (ref 22–32)
CREATININE: 1.38 mg/dL — AB (ref 0.61–1.24)
GFR calc Af Amer: 47 mL/min — ABNORMAL LOW (ref >60)
GFR calc non Af Amer: 40 mL/min — ABNORMAL LOW (ref >60)
Glucose, Bld: 92 mg/dL (ref 65–99)
Potassium: 4.5 mmol/L (ref 3.5–5.1)
SODIUM: 134 mmol/L — AB (ref 135–145)

## 2016-03-18 NOTE — Progress Notes (Signed)
Location:   [place] Room Number: 139/W Place of Service:  SNF (31) Provider:  Granville Lewis  No primary care provider on file.  Patient Care Team: Danie Binder, MD as Consulting Physician (Gastroenterology) [name], MD as Consulting Physician [facility])  Extended Emergency Contact Information Primary Emergency Contact: Black,Shirley Address: [address]          [place], [place] [address] [name] of [place] Phone: [phone] Mobile Phone: [phone] Relation: Daughter  Code Status:  DNR Goals of care: Advanced Directive information Advanced Directives 03/18/2016  Does Patient Have a Medical Advance Directive? Yes  Type of Advance Directive Out of facility DNR (pink MOST or yellow form)  Does patient want to make changes to medical advance directive? No - Patient declined  Copy of [place] in Chart? -  Pre-existing out of facility DNR order (yellow form or pink MOST form) -     Chief Complaint  Patient presents with  . Acute Visit    F/U Cellulitis  As well as renal insufficiency  HPI:  Pt is a 83 y.o. male seen today for an acute visit for follow-up of left foot cellulitis he was seen earlier this week he had increased erythema of his distal left foot as well as a blister on his left great toe-he was started on Bactrim-he previously does have a history of osteomyelitis of the left foot and it been on the Cipro doxycycline combination obut this caused significant dysphagia and this recommendation is avoid this in the future.  He appears to be doing fairly well with the Bactrim in fact the erythema appears essentially resolved quite minimal the blister on the toe appears to have burst but this appears at this point improved certainly from presentation earlier this week.  We did order a BMP as well which today shows creatinine is risen somewhat up to 1.38 appears recent baseline has been more in the 1-1.24 area it  was 1.1  6 days ago.  Apparently he is eating and drinking fairly well but we will have to encourage fluids.  Also Bactrim may have renal implications and we may have to adjust the dose here-clinically he appears to be stable   Past Medical History:  Diagnosis Date  . Anxiety   . Bilateral foot pain   . CHF (congestive heart failure) ([place])   . Diverticulitis   . DJD (degenerative joint disease), cervical   . DNR (do not resuscitate)   . Esophageal dysmotility    age related per BPE 04/2015  . Esophageal stricture   . GERD (gastroesophageal reflux disease)   . Gout   . Hiatal hernia   . History of recurrent TIAs   . HTN (hypertension)   . Hyperlipidemia   . Inguinal hernia   . Ischemic heart disease   . Kidney stone   . Osteoarthritis    bilat knees  . Pleural effusion   . Reflux   . Restless leg syndrome   . Right hip pain   . Skin cancer, basal cell   . Urinary retention    Past Surgical History:  Procedure Laterality Date  . ESOPHAGEAL DILATION    . EXPLORATORY LAPAROTOMY W/ BOWEL RESECTION    . FRACTURE SURGERY    . HEMIARTHROPLASTY HIP     Dr. Aline Brochure  . intestines      Allergies  Allergen Reactions  . Celebrex [Celecoxib] Other (See Comments)    Unknown; patient can't  remember  . Codeine Other (See Comments)    nausea    Current Outpatient Prescriptions on File Prior to Visit  Medication Sig Dispense Refill  . aspirin EC 81 MG tablet Take 81 mg by mouth daily.    Marland Kitchen CAMPHOR-EUCALYPTUS-MENTHOL EX Vicks vapor rub apply to the chest bid prn twice a day    . Cholecalciferol (VITAMIN D) 2000 UNITS CAPS Take 1 capsule by mouth daily.    . diclofenac sodium (VOLTAREN) 1 % GEL Apply 2 grams to left hip and left knee q 6 hours PRN for pain    . docusate sodium (COLACE) 100 MG capsule Take 100 mg by mouth daily. For constipation.    Marland Kitchen guaifenesin (ROBITUSSIN) 100 MG/5ML syrup 15 ml by mouth as needed every 6 hours prn    . HYDROcodone-acetaminophen  (NORCO/VICODIN) 5-325 MG tablet Take one tablet by mouth twice daily and every 6 hours as needed for pain. Max APAP 3gm/24 hours from all sources 180 tablet 0  . magnesium hydroxide (MILK OF MAGNESIA) 400 MG/5ML suspension Take 30 mLs by mouth daily as needed for mild constipation. If no relief try a Fleets enema, after 2 days.    . Melatonin 3 MG CAPS Take 3 mg by mouth at bedtime as needed (for sleep).    . Menthol 4.8 MG LOZG Use as directed 4.8 mg in the mouth or throat every 4 (four) hours as needed.    . NON FORMULARY Magic Cup once a day    . omeprazole (PRILOSEC) 20 MG capsule Take 20 mg by mouth daily.    Marland Kitchen rOPINIRole (REQUIP) 2 MG tablet Take 2 mg by mouth at bedtime.    . sertraline (ZOLOFT) 50 MG tablet Take 50 mg by mouth at bedtime.     . Vitamins A & D (VITAMIN A & D) ointment Apply A&D ointment to bilateral feet and toes BID & PRN     No current facility-administered medications on file prior to visit.      Review of Systems   General no complaints of fever or chills.   does not complain of rashes or itching does have a history of superficial skin cancers that are followedand removedby dermatology--blister and increased erythema have been noted of his left great toe and foot area but this appears improved he is not really complaining of pain today  Ear nose mouth and throat does not complaining of any visual changes or sore throat dysphagia apparently has improveddoes not complain of today his appetite appears to be pretty good  . Respiratory r does not complain of shortness breath or cough.  Cardiac no chest pain lower extremity edemasomewhat more pronounced on the left versus the right which is not new.  GU has an indwelling Foley catheter does not complain of dysuria.  GI is not complaining of abdominal pain nausea vomiting diarrhea or constipation.  Muscle skeletal has complained of some left hip and knee pain in the past --foot pain aparently has  improved with Voltaren gel.--The patient continues to be improved does not really complaining for pain this evening   Neurologic is not complaining of dizziness headache or numbness   Psych does have a history of depression this is been quite stable for some time appears to be in good spirits quite interactive conversantwhich is usually his baseline  Immunization History  Administered Date(s) Administered  . Influenza,inj,Quad PF,36+ Mos 12/07/2014  . Influenza-Unspecified 11/20/2013, 11/13/2015  . PPD Test 03/16/2013  . Pneumococcal-Unspecified 02/14/2009, 11/25/2015  Pertinent  Health Maintenance Due  Topic Date Due  . PNA vac Low Risk Adult (2 of 2 - PCV13) 11/24/2016  . INFLUENZA VACCINE  Completed   No flowsheet data found. Functional Status Survey:   Temperature 97.2 pulse 80 respirations of 18 blood pressure 148/68--105/57-109/74 there is quite a bit of variation I do not see consistent elevations   Physical Exam Gen. this is a pleasant elderly male in no distress sitting in his wheelchair  Skin is warm and I does have numerous solar induced changes diffusehe has been followed closely by dermatology-      Oropharynx clear mucous membranes moist.  Chest is clear to auscultation with shallow air entry no labored breathing.  Heart sounds largely regular rate and rhythm with occasional irregular beats he has some chronic left lower extremity edema which appears relatively baseline  Abdomen continues to have a right-sided hernia which appears unchanged from previous exams his abdomen continues to be protuberant but soft and nontender he does have a well-healed surgical scar-bowel sounds are positive  Muscle skeletal general frailty is able to walk extremities 4 limited range of motion with arthritic changes   Erythema on the left foot appears largely resolved this is quite minimal now has slight erythema of the left great toe with this appears  improved as well the blister appears to have burst - .  Neurologic is grossly intact to speech is clear no lateralizing findings.- Touch sensation appears to be intact lower extremities bilaterally as well as feet  Psych continues to be largely alert and oriented pleasant and appropriate  Labs reviewed:  Recent Labs  12/18/15 0700 03/12/16 0720 03/18/16 0700  NA 135 134* 134*  K 4.5 4.5 4.5  CL 102 100* 102  CO2 26 25 25   GLUCOSE 94 93 92  BUN 17 13 13   CREATININE 1.12 1.10 1.38*  CALCIUM 8.4* 8.8* 8.6*    Recent Labs  04/28/15 1655 04/29/15 0448 10/14/15 0750  AST 24 18 18   ALT 15* 12* 13*  ALKPHOS 94 77 104  BILITOT 0.7 0.7 0.4  PROT 7.2 6.3* 6.8  ALBUMIN 3.6 3.0* 3.4*    Recent Labs  09/14/15 1035 10/14/15 0750 12/18/15 0700 03/12/16 0720  WBC 10.6* 9.3 8.2 8.6  NEUTROABS 7.1 6.3  --  4.7  HGB 11.9* 11.3* 11.8* 12.2*  HCT 37.0* 35.0* 35.9* 37.0*  MCV 88.3 88.8 87.1 86.0  PLT 217 190 183 220   No results found for: TSH Lab Results  Component Value Date   HGBA1C 6.1 (H) 11/12/2014   No results found for: CHOL, HDL, LDLCALC, LDLDIRECT, TRIG, CHOLHDL  Significant Diagnostic Results in last 30 days:  No results found.  Assessment/Plan #1-left foot cellulitis-this appears to be largely resolved he is completing course of Bactrim-still has some very minimal erythema-his creatinine has risen somewhat-Bactrim may have renal side effects-at this point will change Bactrim dosing to every other day and updated metabolic panel tomorrow --.  Again will await updated labs clinically appears stable we also have encouraged him to drink fluids and when I checked it later in the day he said he was doing that  [place], [place], [place]

## 2016-03-19 ENCOUNTER — Encounter (HOSPITAL_COMMUNITY)
Admission: RE | Admit: 2016-03-19 | Discharge: 2016-03-19 | Disposition: A | Discharge status: Home / Self Care | Payer: Medicare Other | Attending: Vascular Surgery | Admitting: Vascular Surgery

## 2016-03-19 DIAGNOSIS — I10 Essential (primary) hypertension: Secondary | ICD-10-CM | POA: Diagnosis not present

## 2016-03-19 LAB — BASIC METABOLIC PANEL
ANION GAP: 6 (ref 5–15)
BUN: 15 mg/dL (ref 6–20)
CHLORIDE: 101 mmol/L (ref 101–111)
CO2: 26 mmol/L (ref 22–32)
Calcium: 8.8 mg/dL — ABNORMAL LOW (ref 8.9–10.3)
Creatinine, Ser: 1.4 mg/dL — ABNORMAL HIGH (ref 0.61–1.24)
GFR calc non Af Amer: 40 mL/min — ABNORMAL LOW (ref >60)
GFR, EST AFRICAN AMERICAN: 46 mL/min — AB (ref >60)
GLUCOSE: 91 mg/dL (ref 65–99)
Potassium: 4.7 mmol/L (ref 3.5–5.1)
Sodium: 133 mmol/L — ABNORMAL LOW (ref 135–145)

## 2016-03-21 ENCOUNTER — Encounter (HOSPITAL_COMMUNITY)
Admission: RE | Admit: 2016-03-21 | Discharge: 2016-03-21 | Disposition: A | Discharge status: Home / Self Care | Payer: Medicare Other | Source: Skilled Nursing Facility | Attending: Internal Medicine | Admitting: Internal Medicine

## 2016-03-21 DIAGNOSIS — S01302D Unspecified open wound of left ear, subsequent encounter: Secondary | ICD-10-CM | POA: Diagnosis not present

## 2016-03-21 DIAGNOSIS — X58XXXD Exposure to other specified factors, subsequent encounter: Secondary | ICD-10-CM | POA: Diagnosis not present

## 2016-03-21 DIAGNOSIS — I259 Chronic ischemic heart disease, unspecified: Secondary | ICD-10-CM | POA: Diagnosis not present

## 2016-03-21 DIAGNOSIS — R339 Retention of urine, unspecified: Secondary | ICD-10-CM | POA: Diagnosis not present

## 2016-03-21 DIAGNOSIS — M1000 Idiopathic gout, unspecified site: Secondary | ICD-10-CM | POA: Insufficient documentation

## 2016-03-21 DIAGNOSIS — M869 Osteomyelitis, unspecified: Secondary | ICD-10-CM | POA: Diagnosis not present

## 2016-03-21 LAB — BASIC METABOLIC PANEL
Anion gap: 8 (ref 5–15)
BUN: 15 mg/dL (ref 6–20)
CO2: 25 mmol/L (ref 22–32)
CREATININE: 1.48 mg/dL — AB (ref 0.61–1.24)
Calcium: 8.5 mg/dL — ABNORMAL LOW (ref 8.9–10.3)
Chloride: 100 mmol/L — ABNORMAL LOW (ref 101–111)
GFR calc Af Amer: 43 mL/min — ABNORMAL LOW (ref >60)
GFR, EST NON AFRICAN AMERICAN: 37 mL/min — AB (ref >60)
Glucose, Bld: 94 mg/dL (ref 65–99)
Potassium: 4.6 mmol/L (ref 3.5–5.1)
SODIUM: 133 mmol/L — AB (ref 135–145)

## 2016-03-24 DIAGNOSIS — X32XXXD Exposure to sunlight, subsequent encounter: Secondary | ICD-10-CM | POA: Diagnosis not present

## 2016-03-24 DIAGNOSIS — Z85828 Personal history of other malignant neoplasm of skin: Secondary | ICD-10-CM | POA: Diagnosis not present

## 2016-03-24 DIAGNOSIS — Z08 Encounter for follow-up examination after completed treatment for malignant neoplasm: Secondary | ICD-10-CM | POA: Diagnosis not present

## 2016-03-24 DIAGNOSIS — L570 Actinic keratosis: Secondary | ICD-10-CM | POA: Diagnosis not present

## 2016-03-26 ENCOUNTER — Encounter (HOSPITAL_COMMUNITY)
Admission: AD | Admit: 2016-03-26 | Discharge: 2016-03-26 | Disposition: A | Discharge status: Home / Self Care | Payer: Medicare Other | Source: Skilled Nursing Facility | Attending: *Deleted | Admitting: *Deleted

## 2016-03-26 ENCOUNTER — Non-Acute Institutional Stay (SKILLED_NURSING_FACILITY): Payer: Medicare Other | Admitting: Internal Medicine

## 2016-03-26 DIAGNOSIS — R059 Cough, unspecified: Secondary | ICD-10-CM

## 2016-03-26 DIAGNOSIS — R0989 Other specified symptoms and signs involving the circulatory and respiratory systems: Secondary | ICD-10-CM | POA: Diagnosis not present

## 2016-03-26 DIAGNOSIS — R05 Cough: Secondary | ICD-10-CM

## 2016-03-26 DIAGNOSIS — I10 Essential (primary) hypertension: Secondary | ICD-10-CM | POA: Diagnosis not present

## 2016-03-26 LAB — INFLUENZA PANEL BY PCR (TYPE A & B)
INFLAPCR: NEGATIVE
INFLBPCR: NEGATIVE

## 2016-03-26 NOTE — Progress Notes (Signed)
This is an acute visit.  Level care skilled.  Facility is [facility].  Chief complaint-acute visit secondary to cough.  History of present illness.  Patient is a very pleasant 83 year old male he states he's had a cough for the past couple days-says he feels at times he has some shortness of breath but does not describe this as acute.  Cough apparently is nonproductive-he is afebrile.  He is finishing a course of Bactrim for left foot cellulitis which appears to have resolved.  Currently he is sitting in his wheelchair comfortably but hears to be feeling a little weaker than he usually does.  Vital signs are stable O2 saturation is in the 90s on room air.  He does state he feels that chilly even though he knows is warm in the room  Back in October 2016 was hospitalized for pneumonia received IV antibiotics in was transitioned to Levaquin at that point his white count was significantly elevated as well        Past Medical History:  Diagnosis Date  . Anxiety   . Bilateral foot pain   . CHF (congestive heart failure) ([place])   . Diverticulitis   . DJD (degenerative joint disease), cervical   . DNR (do not resuscitate)   . Esophageal dysmotility    age related per BPE 04/2015  . Esophageal stricture   . GERD (gastroesophageal reflux disease)   . Gout   . Hiatal hernia   . History of recurrent TIAs   . HTN (hypertension)   . Hyperlipidemia   . Inguinal hernia   . Ischemic heart disease   . Kidney stone   . Osteoarthritis    bilat knees  . Pleural effusion   . Reflux   . Restless leg syndrome   . Right hip pain   . Skin cancer, basal cell   . Urinary retention         Past Surgical History:  Procedure Laterality Date  . ESOPHAGEAL DILATION    . EXPLORATORY LAPAROTOMY W/ BOWEL RESECTION    . FRACTURE SURGERY    . HEMIARTHROPLASTY HIP     Dr. Aline Brochure  . intestines      Allergies  Allergen Reactions  . Celebrex  [Celecoxib] Other (See Comments)    Unknown; patient can't remember  . Codeine Other (See Comments)    nausea          Current Outpatient Prescriptions on File Prior to Visit  Medication Sig Dispense Refill  . aspirin EC 81 MG tablet Take 81 mg by mouth daily.    Marland Kitchen CAMPHOR-EUCALYPTUS-MENTHOL EX Vicks vapor rub apply to the chest bid prn twice a day    . Cholecalciferol (VITAMIN D) 2000 UNITS CAPS Take 1 capsule by mouth daily.    . diclofenac sodium (VOLTAREN) 1 % GEL Apply 2 grams to left hip and left knee q 6 hours PRN for pain    . docusate sodium (COLACE) 100 MG capsule Take 100 mg by mouth daily. For constipation.    Marland Kitchen guaifenesin (ROBITUSSIN) 100 MG/5ML syrup 15 ml by mouth as needed every 6 hours prn    . HYDROcodone-acetaminophen (NORCO/VICODIN) 5-325 MG tablet Take one tablet by mouth twice daily and every 6 hours as needed for pain. Max APAP 3gm/24 hours from all sources 180 tablet 0  . magnesium hydroxide (MILK OF MAGNESIA) 400 MG/5ML suspension Take 30 mLs by mouth daily as needed for mild constipation. If no relief try a Fleets enema, after 2  days.    . Melatonin 3 MG CAPS Take 3 mg by mouth at bedtime as needed (for sleep).    . Menthol 4.8 MG LOZG Use as directed 4.8 mg in the mouth or throat every 4 (four) hours as needed.    . NON FORMULARY Magic Cup once a day    . omeprazole (PRILOSEC) 20 MG capsule Take 20 mg by mouth daily.    Marland Kitchen rOPINIRole (REQUIP) 2 MG tablet Take 2 mg by mouth at bedtime.    . sertraline (ZOLOFT) 50 MG tablet Take 50 mg by mouth at bedtime.     . Vitamins A & D (VITAMIN A & D) ointment Apply A&D ointment to bilateral feet and toes BID & PRN     No current facility-administered medications on file prior to visit.      Review of Systems   General Does not complain overtly of fever but does say he feels chilly   Skin  is not complaining of diaphoresis  Ear nose mouth and throat does not complaining of any  visual changes or sore throat   . Respiratory does complain of a nonproductive cough does occasionally feels he is a bit short of breath  Cardiac no chest pain lower extremity edemasomewhat more pronounced on the left versus the right which is not new.  GU has an indwelling Foley catheter does not complain of dysuria.  GI is not complaining of abdominal pain nausea vomiting diarrhea or constipation.  Muscle skeletal--does not complaining of joint pain currently   Neurologic is not complaining of dizziness headache or numbness   Psych does have a history of depression this is been quite stable for some time appears to be in good spirits quite interactive conversantwhich is usually his baseline      Immunization History  Administered Date(s) Administered  . Influenza,inj,Quad PF,36+ Mos 12/07/2014  . Influenza-Unspecified 11/20/2013, 11/13/2015  . PPD Test 03/16/2013  . Pneumococcal-Unspecified 02/14/2009, 11/25/2015       Pertinent  Health Maintenance Due  Topic Date Due  . PNA vac Low Risk Adult (2 of 2 - PCV13) 11/24/2016  . INFLUENZA VACCINE  Completed   No flowsheet data found. Functional Status Survey: He is afebrile pulse 68 respirations 18 blood pressure 140/68 O2 saturation is in the 90s on room air  Physical Exam Gen. this is a pleasant elderly male in no distress sitting in his wheelchair. Refusing a bit weaker than normal  Skin is warm and I does have numerous solar induced changes diffusehe has been followed closely by dermatology-      Oropharynx clear mucous membranes moist.  Chest there is no labored breathing he has some slight diffuse bronchial sounds no sign of distress  Heart sounds largely regular rate and rhythm with occasional irregular beats he has some chronic left lower extremity edema which appears relatively baseline  Abdomen continues to have a right-sided hernia which appears unchanged from previous exams  his abdomen continues to be protuberant but soft and nontender he does have a well-healed surgical scar-bowel sounds are positive  Muscle skeletal general frailty is able to move all extremities 4 limited range of motion with arthritic changes   t - .  Neurologic is grossly intact to speech is clear no lateralizing findings.-   Psych continues to be largely alert and oriented pleasant and appropriate  Labs reviewed:  10/19/2016.  Sodium 133 potassium 4.6 BUN 15 creatinine 1.48  Recent Labs (within last 365 days)   Recent  Labs  12/18/15 0700 03/12/16 0720 03/18/16 0700  NA 135 134* 134*  K 4.5 4.5 4.5  CL 102 100* 102  CO2 26 25 25   GLUCOSE 94 93 92  BUN 17 13 13   CREATININE 1.12 1.10 1.38*  CALCIUM 8.4* 8.8* 8.6*      Recent Labs (within last 365 days)   Recent Labs  04/28/15 1655 04/29/15 0448 10/14/15 0750  AST 24 18 18   ALT 15* 12* 13*  ALKPHOS 94 77 104  BILITOT 0.7 0.7 0.4  PROT 7.2 6.3* 6.8  ALBUMIN 3.6 3.0* 3.4*      Recent Labs (within last 365 days)   Recent Labs  09/14/15 1035 10/14/15 0750 12/18/15 0700 03/12/16 0720  WBC 10.6* 9.3 8.2 8.6  NEUTROABS 7.1 6.3  --  4.7  HGB 11.9* 11.3* 11.8* 12.2*  HCT 37.0* 35.0* 35.9* 37.0*  MCV 88.3 88.8 87.1 86.0  PLT 217 190 183 220     Recent Labs  No results found for: TSH   Recent Labs   Assessment and plan.  History of cough-will obtain a chest x-ray 2 view-also will add Mucinex 600 mg twice a day for 7 days-as well as duo nebs every 6 hours when necessary every 6 hours 5 days.  Also will obtain vital signs pulse ox every shift for 48 hours-.  Obtain a CBC with differential and metabolic panel in a.m. tomorrow.  Also will obtain a flu swab.  Of note he still is on Bactrim DS every other day until February 12-again will await updated chest x-ray  before making any antibiotic changes  [id number]

## 2016-03-27 ENCOUNTER — Other Ambulatory Visit (HOSPITAL_COMMUNITY)
Admission: AD | Admit: 2016-03-27 | Discharge: 2016-03-27 | Disposition: A | Discharge status: Home / Self Care | Payer: Medicare Other | Source: Skilled Nursing Facility | Attending: Internal Medicine | Admitting: Internal Medicine

## 2016-03-27 DIAGNOSIS — I10 Essential (primary) hypertension: Secondary | ICD-10-CM | POA: Insufficient documentation

## 2016-03-27 LAB — BASIC METABOLIC PANEL
Anion gap: 6 (ref 5–15)
BUN: 16 mg/dL (ref 6–20)
CALCIUM: 8.4 mg/dL — AB (ref 8.9–10.3)
CO2: 25 mmol/L (ref 22–32)
Chloride: 99 mmol/L — ABNORMAL LOW (ref 101–111)
Creatinine, Ser: 1.51 mg/dL — ABNORMAL HIGH (ref 0.61–1.24)
GFR calc Af Amer: 42 mL/min — ABNORMAL LOW (ref >60)
GFR, EST NON AFRICAN AMERICAN: 36 mL/min — AB (ref >60)
GLUCOSE: 106 mg/dL — AB (ref 65–99)
Potassium: 4.9 mmol/L (ref 3.5–5.1)
Sodium: 130 mmol/L — ABNORMAL LOW (ref 135–145)

## 2016-03-29 ENCOUNTER — Encounter: Payer: Self-pay | Admitting: Internal Medicine

## 2016-03-29 ENCOUNTER — Non-Acute Institutional Stay (SKILLED_NURSING_FACILITY): Payer: Medicare Other | Admitting: Internal Medicine

## 2016-03-29 DIAGNOSIS — N183 Chronic kidney disease, stage 3 unspecified: Secondary | ICD-10-CM

## 2016-03-29 DIAGNOSIS — R059 Cough, unspecified: Secondary | ICD-10-CM

## 2016-03-29 DIAGNOSIS — R05 Cough: Secondary | ICD-10-CM | POA: Diagnosis not present

## 2016-03-29 DIAGNOSIS — L03115 Cellulitis of right lower limb: Secondary | ICD-10-CM | POA: Diagnosis not present

## 2016-03-29 NOTE — Progress Notes (Signed)
Location:  [place] of Service:  SNF [phone]) Provider:  Veleta Miners MD    Patient Care Team: Danie Binder, MD as Consulting Physician (Gastroenterology)   Extended Emergency Contact Information Primary Emergency Contact: Black,Shirley Address: [address]          [place], [place] [address] [name] of [place] Phone: [phone] Mobile Phone: [phone] Relation: Daughter  Code Status:  Goals of care: Advanced Directive information Advanced Directives 03/18/2016  Does Patient Have a Medical Advance Directive? Yes  Type of Advance Directive Out of facility DNR (pink MOST or yellow form)  Does patient want to make changes to medical advance directive? No - Patient declined  Copy of [place] in Chart? -  Pre-existing out of facility DNR order (yellow form or pink MOST form) -     Chief Complaint  Patient presents with  . Acute Visit    For Follow up on Foot Wound    HPI:  Pt is a 83 y.o. male seen today for an acute visit for Follow up of the wound on his Left foot, Cough and new lesion on Right foot.  Patient has h/o arthritis, BPH, CHF, H/o TIA and restless leg syndrome. He has Chronic Foley Cathter. He recently had some redness in his right foot and was started on Bactrim.  His foot since then has been better. But today Nurse also Noticed a area in his Left Toe. Patient also had some cough but says it is much better on Mucinex. He denies any Pain in his feet today. No fever or chills. No SOB or Chest pain. His appetite is good.  Past Medical History:  Diagnosis Date  . Anxiety   . Bilateral foot pain   . CHF (congestive heart failure) ([place])   . Diverticulitis   . DJD (degenerative joint disease), cervical   . DNR (do not resuscitate)   . Esophageal dysmotility    age related per BPE 04/2015  . Esophageal stricture   . GERD (gastroesophageal reflux disease)   . Gout   . Hiatal hernia   . History of recurrent TIAs    . HTN (hypertension)   . Hyperlipidemia   . Inguinal hernia   . Ischemic heart disease   . Kidney stone   . Osteoarthritis    bilat knees  . Pleural effusion   . Reflux   . Restless leg syndrome   . Right hip pain   . Skin cancer, basal cell   . Urinary retention    Past Surgical History:  Procedure Laterality Date  . ESOPHAGEAL DILATION    . EXPLORATORY LAPAROTOMY W/ BOWEL RESECTION    . FRACTURE SURGERY    . HEMIARTHROPLASTY HIP     Dr. Aline Brochure  . intestines      Allergies  Allergen Reactions  . Celebrex [Celecoxib] Other (See Comments)    Unknown; patient can't remember  . Codeine Other (See Comments)    nausea    Allergies as of 03/29/2016      Reactions   Celebrex [celecoxib] Other (See Comments)   Unknown; patient can't remember   Codeine Other (See Comments)   nausea      Medication List    Outpatient Encounter Prescriptions as of 03/29/2016  Medication Sig  . aspirin EC 81 MG tablet Take 81 mg by mouth daily.  Marland Kitchen CAMPHOR-EUCALYPTUS-MENTHOL EX Vicks vapor rub apply to the chest bid prn twice a day  . Cholecalciferol (  VITAMIN D) 2000 UNITS CAPS Take 1 capsule by mouth daily.  . diclofenac sodium (VOLTAREN) 1 % GEL Apply 2 grams to left hip and left knee q 6 hours PRN for pain  . docusate sodium (COLACE) 100 MG capsule Take 100 mg by mouth daily. For constipation.  Marland Kitchen guaifenesin (ROBITUSSIN) 100 MG/5ML syrup 15 ml by mouth as needed every 6 hours prn  . HYDROcodone-acetaminophen (NORCO/VICODIN) 5-325 MG tablet Take one tablet by mouth twice daily and every 6 hours as needed for pain. Max APAP 3gm/24 hours from all sources  . magnesium hydroxide (MILK OF MAGNESIA) 400 MG/5ML suspension Take 30 mLs by mouth daily as needed for mild constipation. If no relief try a Fleets enema, after 2 days.  . Melatonin 3 MG CAPS Take 3 mg by mouth at bedtime as needed (for sleep).  . Menthol 4.8 MG LOZG Use as directed 4.8 mg in the mouth or throat every 4 (four) hours as  needed.  Marland Kitchen omeprazole (PRILOSEC) 20 MG capsule Take 20 mg by mouth daily.  . Probiotic Product (RISA-BID PROBIOTIC) TABS Give 1 tablet by mouth twice a day until 03/23/16  . rOPINIRole (REQUIP) 2 MG tablet Take 2 mg by mouth at bedtime.  . sertraline (ZOLOFT) 50 MG tablet Take 50 mg by mouth at bedtime.   . Vitamins A & D (VITAMIN A & D) ointment Apply A&D ointment to bilateral feet and toes BID & PRN  . NON FORMULARY Magic Cup once a day  .              Review of Systems  Review of Systems  Constitutional: Negative for activity change, appetite change, chills, diaphoresis, fatigue and fever.  HENT: Negative for mouth sores, postnasal drip, rhinorrhea, sinus pain and sore throat.   Respiratory: Negative for apnea, he has some dry cough , No  chest tightness, shortness of breath or wheezing.   Cardiovascular: Negative for chest pain, palpitations and Positive for leg swelling.  Gastrointestinal: Negative for abdominal distention, abdominal pain, constipation, diarrhea, nausea and vomiting.  Genitourinary: Negative for dysuria and frequency.  Musculoskeletal: Negative for arthralgias, joint swelling and myalgias.  Skin: Negative for rash.  Neurological: Negative for dizziness, syncope, weakness, light-headedness and numbness.  Psychiatric/Behavioral: Negative for behavioral problems, confusion and sleep disturbance.    Immunization History  Administered Date(s) Administered  . Influenza,inj,Quad PF,36+ Mos 12/07/2014  . Influenza-Unspecified 11/20/2013, 11/13/2015  . PPD Test 03/16/2013  . Pneumococcal-Unspecified 02/14/2009, 11/25/2015   Pertinent  Health Maintenance Due  Topic Date Due  . PNA vac Low Risk Adult (2 of 2 - PCV13) 11/24/2016  . INFLUENZA VACCINE  Completed   No flowsheet data found. Functional Status Survey:    Vitals:   03/29/16 2147  BP: 136/64  Pulse: 92  Resp: 18  Temp: 98.3 F (36.8 C)   There is no height or weight on file to calculate  BMI. Physical Exam  Constitutional: He is oriented to person, place, and time. He appears well-developed and well-nourished.  HENT:  Head: Normocephalic.  Mouth/Throat: Oropharynx is clear and moist.  Cardiovascular: Normal rate, regular rhythm and normal heart sounds.   No murmur heard. Pulmonary/Chest: Effort normal.  B/L Expiratory Wheezing.  Abdominal: Soft. Bowel sounds are normal. He exhibits no distension. There is no tenderness. There is no rebound.  Neurological: He is alert and oriented to person, place, and time.  Skin:  Seen with Wound care nurse. The redness is resolved from the before. He does have  a scab which is healing well. He does hav new redness on to his left Toe. But no tenderness. Feeble pulses in Both LE    Labs reviewed:  Recent Labs  03/19/16 0805 03/21/16 0700 03/27/16 0735  NA 133* 133* 130*  K 4.7 4.6 4.9  CL 101 100* 99*  CO2 26 25 25   GLUCOSE 91 94 106*  BUN 15 15 16   CREATININE 1.40* 1.48* 1.51*  CALCIUM 8.8* 8.5* 8.4*    Recent Labs  04/28/15 1655 04/29/15 0448 10/14/15 0750  AST 24 18 18   ALT 15* 12* 13*  ALKPHOS 94 77 104  BILITOT 0.7 0.7 0.4  PROT 7.2 6.3* 6.8  ALBUMIN 3.6 3.0* 3.4*    Recent Labs  09/14/15 1035 10/14/15 0750 12/18/15 0700 03/12/16 0720  WBC 10.6* 9.3 8.2 8.6  NEUTROABS 7.1 6.3  --  4.7  HGB 11.9* 11.3* 11.8* 12.2*  HCT 37.0* 35.0* 35.9* 37.0*  MCV 88.3 88.8 87.1 86.0  PLT 217 190 183 220   No results found for: TSH Lab Results  Component Value Date   HGBA1C 6.1 (H) 11/12/2014   No results found for: CHOL, HDL, LDLCALC, LDLDIRECT, TRIG, CHOLHDL  Significant Diagnostic Results in last 30 days:  No results found.  Assessment/Plan  Cellulitis of right foot Mostly resolved on Bactrim. Will continue to follow. Patient did have ABI in Dec of 2015. He has refused any aggressive intervention Before. Will follow and see if he needs repeat ABI. So far the left lesion is just small Benign Lesion. Will  continue to monitor.  Cough Mostly resolved. Did have some wheezing today. Will continue Duo Nebs  Chronic renal impairment, stage 3 (moderate) Patient had some worsening Creat most likely due to Bactrim. Will follow with repeat BMP as patient is off Antibiotics.     Family/ staff Communication:   Labs/tests ordered:  Follow up BMP and CBC

## 2016-03-31 ENCOUNTER — Non-Acute Institutional Stay (SKILLED_NURSING_FACILITY): Payer: Medicare Other | Admitting: Internal Medicine

## 2016-03-31 ENCOUNTER — Other Ambulatory Visit (HOSPITAL_COMMUNITY)
Admission: AD | Admit: 2016-03-31 | Discharge: 2016-03-31 | Disposition: A | Discharge status: Home / Self Care | Payer: Medicare Other | Source: Skilled Nursing Facility | Attending: Internal Medicine | Admitting: Internal Medicine

## 2016-03-31 ENCOUNTER — Encounter: Payer: Self-pay | Admitting: Internal Medicine

## 2016-03-31 DIAGNOSIS — N400 Enlarged prostate without lower urinary tract symptoms: Secondary | ICD-10-CM | POA: Insufficient documentation

## 2016-03-31 DIAGNOSIS — N289 Disorder of kidney and ureter, unspecified: Secondary | ICD-10-CM | POA: Diagnosis not present

## 2016-03-31 DIAGNOSIS — R5383 Other fatigue: Secondary | ICD-10-CM

## 2016-03-31 LAB — URINALYSIS, ROUTINE W REFLEX MICROSCOPIC
BILIRUBIN URINE: NEGATIVE
Glucose, UA: NEGATIVE mg/dL
KETONES UR: NEGATIVE mg/dL
Nitrite: NEGATIVE
Protein, ur: 100 mg/dL — AB
SPECIFIC GRAVITY, URINE: 1.021 (ref 1.005–1.030)
pH: 5 (ref 5.0–8.0)

## 2016-03-31 NOTE — Progress Notes (Signed)
Location:    [facility] Room Number: 139/W Place of Service:  SNF (31) Provider:  [facility]  No primary care provider on file.  Patient Care Team: Danie Binder, MD as Consulting Physician (Gastroenterology) [name], MD as Consulting Physician [facility])  Extended Emergency Contact Information Primary Emergency Contact: Black,Shirley Address: [address]          [place], [place] [address] [name] of [place] Phone: [phone] Mobile Phone: [phone] Relation: Daughter   Goals of care: Advanced Directive information Advanced Directives 03/31/2016  Does Patient Have a Medical Advance Directive? Yes  Type of Advance Directive Out of facility DNR (pink MOST or yellow form)  Does patient want to make changes to medical advance directive? No - Patient declined  Copy of [place] in Chart? -  Pre-existing out of facility DNR order (yellow form or pink MOST form) -     Chief Complaint  Patient presents with  . Acute Visit    Sleepness    HPI:  Pt is a 83 y.o. male seen today for an acute visit for Possible lethargy.  Patient's family members concerned thinking that he is somewhat more lethargic today than he usually is.  Nursing staff states he appears to be at his baseline A just recently had a shower I spoke with his tech who said  that she didn't really see anything out of the ordinary.  I did speak with family member however was concerned saying he's just doesn't quite to be himself.  I do note at times he does have somewhat of a low-grade temperature-he is not really complaining of fever chills pain shortness of breath at this point.  He continues to be pleasant and appropriate-per staff they feel he is grossly becoming somewhat more fatigued than usual but this has been a gradual process and not an overnight  Change he just completed an antibiotic for suspected left foot cellulitis and this appears to have resolved.  He is not  really complaining of any fever or chills dysuria or increased shortness of breath or cough    Past Medical History:  Diagnosis Date  . Anxiety   . Bilateral foot pain   . CHF (congestive heart failure) ([place])   . Diverticulitis   . DJD (degenerative joint disease), cervical   . DNR (do not resuscitate)   . Esophageal dysmotility    age related per BPE 04/2015  . Esophageal stricture   . GERD (gastroesophageal reflux disease)   . Gout   . Hiatal hernia   . History of recurrent TIAs   . HTN (hypertension)   . Hyperlipidemia   . Inguinal hernia   . Ischemic heart disease   . Kidney stone   . Osteoarthritis    bilat knees  . Pleural effusion   . Reflux   . Restless leg syndrome   . Right hip pain   . Skin cancer, basal cell   . Urinary retention    Past Surgical History:  Procedure Laterality Date  . ESOPHAGEAL DILATION    . EXPLORATORY LAPAROTOMY W/ BOWEL RESECTION    . FRACTURE SURGERY    . HEMIARTHROPLASTY HIP     Dr. Aline Brochure  . intestines      Allergies  Allergen Reactions  . Celebrex [Celecoxib] Other (See Comments)    Unknown; patient can't remember  . Codeine Other (See Comments)    nausea    Allergies as of 03/31/2016  Reactions   Celebrex [celecoxib] Other (See Comments)   Unknown; patient can't remember   Codeine Other (See Comments)   nausea      Medication List    Notice   This visit is during an admission. Changes to the med list made in this visit will be reflected in the After Visit Summary of the admission.     Review of Systems General this does not really have any complaints does not complain of fever chills   Skin  is not complaining of diaphoresis  Ear nose mouth and throat does not complaining of any visual changes or sore throat   . Respiratory has had a cough but he says he feels this is getting better  Cardiac no chest pain lower extremity edemasomewhat more pronounced on the left versus the right which is not  new.  GU has an indwelling Foley catheter does not complain of dysuria.  GI is not complaining of abdominal pain nausea vomiting diarrhea or constipation.  Muscle skeletal--does not complaining of joint pain currently   Neurologic is not complaining of dizziness headache or numbness   Psych does have a history of depression this is been quite stable for some time  Immunization History  Administered Date(s) Administered  . Influenza,inj,Quad PF,36+ Mos 12/07/2014  . Influenza-Unspecified 11/20/2013, 11/13/2015  . PPD Test 03/16/2013  . Pneumococcal-Unspecified 02/14/2009, 11/25/2015   Pertinent  Health Maintenance Due  Topic Date Due  . PNA vac Low Risk Adult (2 of 2 - PCV13) 11/24/2016  . INFLUENZA VACCINE  Completed   No flowsheet data found. Functional Status Survey:    Vitals:   03/31/16 1609  BP: (!) 154/62  Pulse: 95  Resp: (!) 24  Temp: 100.1 F (37.8 C)  TempSrc: Oral  SpO2: 92%    Physical Exam   Gen. this is a pleasant elderly male in no distress sitting in his wheelchair. Continues to be frail  Skin is warm and I does have numerous solar induced changes diffusehe has been followed closely by dermatology-      Oropharynx clear mucous membranes moist.  Chest there is no labored breathing he has some slight diffuse bronchial sounds  On the right  no sign of distress  Heart sounds largely regular rate and rhythm with occasional irregular beats he has some chronic left lower extremity edema which appears relatively baseline  Abdomen continues to have a right-sided hernia which appears unchanged from previous exams his abdomen continues to be protuberant but soft and nontender he does have a well-healed surgical scar-bowel sounds are positive  GU does not really have suprapubic tenderness catheter is draining amber colored urine  Muscle skeletal general frailty is able to move all extremities 4 limited range of motion with  arthritic changes   t - .  Neurologic is grossly intact to speech is clear no lateralizing findings.-   Psych continues to be largely alert and oriented pleasant and appropriate--does not really appear to have increased confusion  Labs reviewed:  Recent Labs  03/19/16 0805 03/21/16 0700 03/27/16 0735  NA 133* 133* 130*  K 4.7 4.6 4.9  CL 101 100* 99*  CO2 26 25 25   GLUCOSE 91 94 106*  BUN 15 15 16   CREATININE 1.40* 1.48* 1.51*  CALCIUM 8.8* 8.5* 8.4*    Recent Labs  04/28/15 1655 04/29/15 0448 10/14/15 0750  AST 24 18 18   ALT 15* 12* 13*  ALKPHOS 94 77 104  BILITOT 0.7 0.7 0.4  PROT 7.2  6.3* 6.8  ALBUMIN 3.6 3.0* 3.4*    Recent Labs  09/14/15 1035 10/14/15 0750 12/18/15 0700 03/12/16 0720  WBC 10.6* 9.3 8.2 8.6  NEUTROABS 7.1 6.3  --  4.7  HGB 11.9* 11.3* 11.8* 12.2*  HCT 37.0* 35.0* 35.9* 37.0*  MCV 88.3 88.8 87.1 86.0  PLT 217 190 183 220   No results found for: TSH Lab Results  Component Value Date   HGBA1C 6.1 (H) 11/12/2014   No results found for: CHOL, HDL, LDLCALC, LDLDIRECT, TRIG, CHOLHDL  Significant Diagnostic Results in last 30 days:  No results found.  Assessment/Plan  #1-lethargy?-Somewhat questionable history here -depending on who you speak with-generally he appears to be at his baseline-- general frailty nursing staff feels he is having some small declinewill obtain a urinalysis and culture however-also will monitor vital signs pulse ox every shift for 48 hours to keep an eye on his situation  Earlier he was seen for cough but this is getting better per patient he actually was on an antibiotic for the left foot cellulitis which she has just completed.  Again continue to monitor at times he does have a low-grade temperature this will have to be watched as well.  Will obtain a CBC with differential and metabolic panel tomorrow also.  I do note he does have a history of somewhat declining renal insufficiency with a creatinine  of 1.5 which is somewhat above his baseline more in the lower ones-will update this as well tomorrow-of note he was on Bactrim and this was switched  To   every other day secondary to concerns this may be having some renal complications.  [place], [place], [place]

## 2016-04-01 ENCOUNTER — Encounter (HOSPITAL_COMMUNITY)
Admission: RE | Admit: 2016-04-01 | Discharge: 2016-04-01 | Disposition: A | Discharge status: Home / Self Care | Payer: Medicare Other | Source: Skilled Nursing Facility | Attending: Internal Medicine | Admitting: Internal Medicine

## 2016-04-01 DIAGNOSIS — N400 Enlarged prostate without lower urinary tract symptoms: Secondary | ICD-10-CM | POA: Insufficient documentation

## 2016-04-01 LAB — CBC WITH DIFFERENTIAL/PLATELET
Basophils Absolute: 0 10*3/uL (ref 0.0–0.1)
Basophils Relative: 0 %
Eosinophils Absolute: 0 10*3/uL (ref 0.0–0.7)
Eosinophils Relative: 0 %
HEMATOCRIT: 32.1 % — AB (ref 39.0–52.0)
HEMOGLOBIN: 10.9 g/dL — AB (ref 13.0–17.0)
LYMPHS ABS: 1 10*3/uL (ref 0.7–4.0)
LYMPHS PCT: 22 %
MCH: 28.5 pg (ref 26.0–34.0)
MCHC: 34 g/dL (ref 30.0–36.0)
MCV: 83.8 fL (ref 78.0–100.0)
Monocytes Absolute: 0.7 10*3/uL (ref 0.1–1.0)
Monocytes Relative: 15 %
NEUTROS PCT: 63 %
Neutro Abs: 3 10*3/uL (ref 1.7–7.7)
Platelets: 108 10*3/uL — ABNORMAL LOW (ref 150–400)
RBC: 3.83 MIL/uL — AB (ref 4.22–5.81)
RDW: 14.6 % (ref 11.5–15.5)
WBC: 4.8 10*3/uL (ref 4.0–10.5)

## 2016-04-01 LAB — BASIC METABOLIC PANEL
Anion gap: 8 (ref 5–15)
BUN: 17 mg/dL (ref 6–20)
CHLORIDE: 98 mmol/L — AB (ref 101–111)
CO2: 24 mmol/L (ref 22–32)
Calcium: 8.2 mg/dL — ABNORMAL LOW (ref 8.9–10.3)
Creatinine, Ser: 1.1 mg/dL (ref 0.61–1.24)
GFR calc Af Amer: >60 mL/min (ref >60)
GFR calc non Af Amer: 53 mL/min — ABNORMAL LOW (ref >60)
GLUCOSE: 99 mg/dL (ref 65–99)
POTASSIUM: 3.9 mmol/L (ref 3.5–5.1)
Sodium: 130 mmol/L — ABNORMAL LOW (ref 135–145)

## 2016-04-02 ENCOUNTER — Non-Acute Institutional Stay (SKILLED_NURSING_FACILITY): Payer: Medicare Other | Admitting: Internal Medicine

## 2016-04-02 DIAGNOSIS — D696 Thrombocytopenia, unspecified: Secondary | ICD-10-CM

## 2016-04-02 DIAGNOSIS — N179 Acute kidney failure, unspecified: Secondary | ICD-10-CM

## 2016-04-02 DIAGNOSIS — Z8739 Personal history of other diseases of the musculoskeletal system and connective tissue: Secondary | ICD-10-CM | POA: Diagnosis not present

## 2016-04-02 DIAGNOSIS — M79675 Pain in left toe(s): Secondary | ICD-10-CM

## 2016-04-02 LAB — URINE CULTURE

## 2016-04-03 ENCOUNTER — Encounter (HOSPITAL_COMMUNITY)
Admission: AD | Admit: 2016-04-03 | Discharge: 2016-04-03 | Disposition: A | Discharge status: Home / Self Care | Payer: Medicare Other | Source: Skilled Nursing Facility | Attending: Internal Medicine | Admitting: Internal Medicine

## 2016-04-03 DIAGNOSIS — N400 Enlarged prostate without lower urinary tract symptoms: Secondary | ICD-10-CM | POA: Diagnosis not present

## 2016-04-03 LAB — URINALYSIS, ROUTINE W REFLEX MICROSCOPIC
BILIRUBIN URINE: NEGATIVE
Glucose, UA: NEGATIVE mg/dL
Ketones, ur: NEGATIVE mg/dL
Nitrite: NEGATIVE
PROTEIN: 30 mg/dL — AB
Specific Gravity, Urine: 1.005 (ref 1.005–1.030)
pH: 7 (ref 5.0–8.0)

## 2016-04-03 NOTE — Progress Notes (Signed)
This is an acute visit.  Level care skilled.  Facility is [facility].  Chief complaint-acute visit secondary to painful left great toe.  History of present was.  Patient is a pleasant 83 year old male who is complaining of left great toe pain today.  He has just completed a course of Bactrim for suspected left foot cellulitis and this appears to have resolved unremarkably.  He also has a history of gout at times does receive cultures seen.  Vital signs are stable he denies any fever or chills.  I also note routine blood work shows his platelets somewhat low at 101,000 stasis lower than his baseline there's been no increased bruising and bleeding from baseline.  This will have to be watched.  Past Medical History:  Diagnosis Date  . Anxiety   . Bilateral foot pain   . CHF (congestive heart failure) ([place])   . Diverticulitis   . DJD (degenerative joint disease), cervical   . DNR (do not resuscitate)   . Esophageal dysmotility    age related per BPE 04/2015  . Esophageal stricture   . GERD (gastroesophageal reflux disease)   . Gout   . Hiatal hernia   . History of recurrent TIAs   . HTN (hypertension)   . Hyperlipidemia   . Inguinal hernia   . Ischemic heart disease   . Kidney stone   . Osteoarthritis    bilat knees  . Pleural effusion   . Reflux   . Restless leg syndrome   . Right hip pain   . Skin cancer, basal cell   . Urinary retention         Past Surgical History:  Procedure Laterality Date  . ESOPHAGEAL DILATION    . EXPLORATORY LAPAROTOMY W/ BOWEL RESECTION    . FRACTURE SURGERY    . HEMIARTHROPLASTY HIP     Dr. Aline Brochure  . intestines           Allergies  Allergen Reactions  . Celebrex [Celecoxib] Other (See Comments)    Unknown; patient can't remember  . Codeine Other (See Comments)    nausea        Allergies as of 03/31/2016      Reactions   Celebrex [celecoxib] Other (See Comments)     Unknown; patient can't remember   Codeine Other (See Comments)   nausea      Medication List    Notice    .   Aspirin 81 mg daily.  Vitamin D 2000 units every morning.  Colace 100 mg daily.  Hydrocodone-A CET-05-325 milligrams twice a day.  In every 6 hours when necessary.  Milk of magnesia daily. When necessary.  Mucinex 600 mg twice a day 2 into day.  Prilosec 20 mg daily.  Requip 2 mg daily at bedtime.  ricole lozenges every 4 hours when necessary.  Pus and every 6 hours when necessary.  VapoRub when necessary twice a day.  Voltaren and gel topical 2 g left hip and left knee every 6 hours when necessary.  Zoloftl 50 mg daily.       Review of Systems  General does not complain of fever chills is not really complaining of fatigue today   Skin is not complaining of diaphoresis  Ear nose mouth and throat does not complaining of any visual changes or sore throat   . Respiratory does not complain of shortness of breath this cough appears to be resolving  Cardiac no chest pain lower extremity edemasomewhat more pronounced  on the left versus the right which is not new.  GU has an indwelling Foley catheter does not complain of dysuria.  GI is not complaining of abdominal pain nausea vomiting diarrhea or constipation.  Muscle skeletal--is complaining of left great toe discomfort otherwise is not complaining of joint pain   Neurologic is not complaining of dizziness headache or numbness   Psych does have a history of depression this has been quite stable for some time      Immunization History  Administered Date(s) Administered  . Influenza,inj,Quad PF,36+ Mos 12/07/2014  . Influenza-Unspecified 11/20/2013, 11/13/2015  . PPD Test 03/16/2013  . Pneumococcal-Unspecified 02/14/2009, 11/25/2015       Pertinent  Health Maintenance Due  Topic Date Due  . PNA vac Low Risk Adult (2 of 2 - PCV13) 11/24/2016  . INFLUENZA  VACCINE  Completed   No flowsheet data found. Functional Status Survey:     Vitals:                         Temperature 97.5 pulse 68 respirations 20 blood pressure 96/56  Physical Exam   Gen. this is a pleasant elderly male in no distress sitting in his wheelchair.   Skin is warm and I does have numerous solar induced changes diffusehe has been followed closely by dermatology-  Erythema of his left foot appears resolved-there is some flaking and scaling of previous site of cellulitis on the left foot-left great toe also has some of this  scaling and flakiness.  The toe itself has some mildly increased edema but I do not see at this point erythema concerning for cellulitis there is no drainage there is tenderness to palpation of the toe      Oropharynx clear mucous membranes moist.  Chest there is no labored breathing he has some slight diffuse bronchial sounds  On the right  no sign of distress  Heart sounds largely regular rate and rhythm with occasional irregular beats he has some chronic left lower extremity edema which appears relatively baseline  Abdomen continues to have a right-sided hernia which appears unchanged from previous exams his abdomen continues to be protuberant but soft and nontender he does have a well-healed surgical scar-bowel sounds are positive  GU  catheter is draining amber colored urine  Muscle skeletal general frailty is able to move allextremities 4 limited range of motion with arthritic changes    Left great toe changes as noted above and skin section t - .  Neurologic is grossly intact to speech is clear no lateralizing findings.-   Psych continues to be largely alert and oriented pleasant and appropriate--does not really appear to have increased confusion  Labs reviewed: 04/01/2016.  Sodium 1:30 potassium 3.9 BUN 17 creatinine 1.1 CO2 level XXIV.  WBC 4.8 hemoglobin 10.9 platelets  101,000.     RecentLabs(withinlast365days)   Recent Labs  03/19/16 0805 03/21/16 0700 03/27/16 0735  NA 133* 133* 130*  K 4.7 4.6 4.9  CL 101 100* 99*  CO2 26 25 25   GLUCOSE 91 94 106*  BUN 15 15 16   CREATININE 1.40* 1.48* 1.51*  CALCIUM 8.8* 8.5* 8.4*      RecentLabs(withinlast365days)   Recent Labs  04/28/15 1655 04/29/15 0448 10/14/15 0750  AST 24 18 18   ALT 15* 12* 13*  ALKPHOS 94 77 104  BILITOT 0.7 0.7 0.4  PROT 7.2 6.3* 6.8  ALBUMIN 3.6 3.0* 3.4*      RecentLabs(withinlast365days)   Recent  Labs  09/14/15 1035 10/14/15 0750 12/18/15 0700 03/12/16 0720  WBC 10.6* 9.3 8.2 8.6  NEUTROABS 7.1 6.3  --  4.7  HGB 11.9* 11.3* 11.8* 12.2*  HCT 37.0* 35.0* 35.9* 37.0*  MCV 88.3 88.8 87.1 86.0  PLT 217 190 183 220     RecentLabs  No results found for: TSH   RecentLabs       Lab Results  Component Value Date   HGBA1C 6.1 (H) 11/12/2014     RecentLabs  No results found for: CHOL, HDL, LDLCALC, LDLDIRECT, TRIG, CHOLHDL    Significant Diagnostic Results in last 30 days:  ImagingResults  No results found.   Assessment and plan.  #1 left great toe pain-this will have to be monitored for any recurrent cellulitis increased erythema or pain.  We will clear start colchicine 0.6 mg twice a day for 3 days to see if this gives some relief.  Also will update a uric acid level next lab day.  He does have routine as well as when necessary Norco for relief  #2 thrombocytopenia-platelets are somewhat lower than his average which have been in normal range--hemoglobin also appears on the lower end of his baseline although he has had variable hemoglobins ranging from 10 up to 12.2 within the last 7 months-next lab day as well.  #3 history of renal insufficiency-creatinine of 1.1 BUN of 17 appears improved from recent levels-creatinine had been as high as the mid ones previously this was thought possibly due to the Bactrim  which he is now off-at this point will monitor periodically  [id number]  [phone]

## 2016-04-04 ENCOUNTER — Encounter (HOSPITAL_COMMUNITY)
Admission: RE | Admit: 2016-04-04 | Discharge: 2016-04-04 | Disposition: A | Discharge status: Home / Self Care | Payer: Medicare Other | Source: Skilled Nursing Facility | Attending: Internal Medicine | Admitting: Internal Medicine

## 2016-04-04 DIAGNOSIS — M1000 Idiopathic gout, unspecified site: Secondary | ICD-10-CM | POA: Diagnosis not present

## 2016-04-04 LAB — URINE CULTURE: Culture: NO GROWTH

## 2016-04-04 LAB — CBC WITH DIFFERENTIAL/PLATELET
Basophils Absolute: 0 10*3/uL (ref 0.0–0.1)
Basophils Relative: 0 %
EOS ABS: 0.1 10*3/uL (ref 0.0–0.7)
Eosinophils Relative: 1 %
HCT: 31.6 % — ABNORMAL LOW (ref 39.0–52.0)
HEMOGLOBIN: 10.6 g/dL — AB (ref 13.0–17.0)
LYMPHS ABS: 1.3 10*3/uL (ref 0.7–4.0)
LYMPHS PCT: 23 %
MCH: 27.7 pg (ref 26.0–34.0)
MCHC: 33.5 g/dL (ref 30.0–36.0)
MCV: 82.7 fL (ref 78.0–100.0)
Monocytes Absolute: 1.1 10*3/uL — ABNORMAL HIGH (ref 0.1–1.0)
Monocytes Relative: 20 %
NEUTROS PCT: 56 %
Neutro Abs: 3.1 10*3/uL (ref 1.7–7.7)
Platelets: 154 10*3/uL (ref 150–400)
RBC: 3.82 MIL/uL — AB (ref 4.22–5.81)
RDW: 14.4 % (ref 11.5–15.5)
WBC: 5.6 10*3/uL (ref 4.0–10.5)

## 2016-04-04 LAB — URIC ACID: Uric Acid, Serum: 6.6 mg/dL (ref 4.4–7.6)

## 2016-04-05 ENCOUNTER — Encounter (HOSPITAL_COMMUNITY)
Admission: RE | Admit: 2016-04-05 | Discharge: 2016-04-05 | Disposition: A | Discharge status: Home / Self Care | Payer: Medicare Other | Source: Skilled Nursing Facility | Attending: Internal Medicine | Admitting: Internal Medicine

## 2016-04-05 DIAGNOSIS — F329 Major depressive disorder, single episode, unspecified: Secondary | ICD-10-CM | POA: Diagnosis not present

## 2016-04-05 DIAGNOSIS — I10 Essential (primary) hypertension: Secondary | ICD-10-CM | POA: Insufficient documentation

## 2016-04-05 DIAGNOSIS — G4700 Insomnia, unspecified: Secondary | ICD-10-CM | POA: Diagnosis not present

## 2016-04-05 LAB — BASIC METABOLIC PANEL
Anion gap: 6 (ref 5–15)
BUN: 11 mg/dL (ref 6–20)
CO2: 26 mmol/L (ref 22–32)
CREATININE: 0.94 mg/dL (ref 0.61–1.24)
Calcium: 8.2 mg/dL — ABNORMAL LOW (ref 8.9–10.3)
Chloride: 99 mmol/L — ABNORMAL LOW (ref 101–111)
GFR calc Af Amer: >60 mL/min (ref >60)
Glucose, Bld: 104 mg/dL — ABNORMAL HIGH (ref 65–99)
POTASSIUM: 4.5 mmol/L (ref 3.5–5.1)
SODIUM: 131 mmol/L — AB (ref 135–145)

## 2016-04-09 IMAGING — DX DG FOOT COMPLETE 3+V*R*
3 series · 3 of 3 positions shown · non-contrast
Comparison: None.

CLINICAL DATA: Bilateral foot pain.  No injury.

EXAM:
RIGHT FOOT COMPLETE - 3+ VIEW

[foot ap]
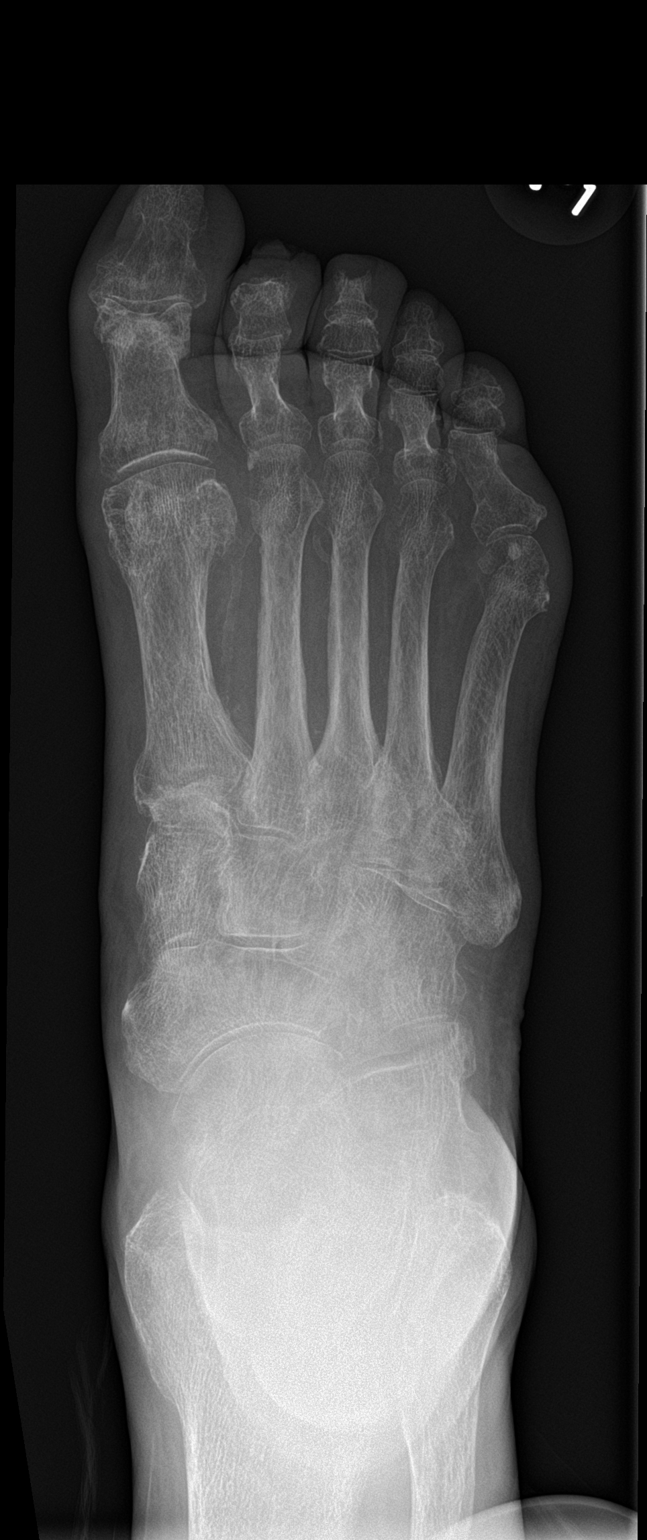

[foot obl]
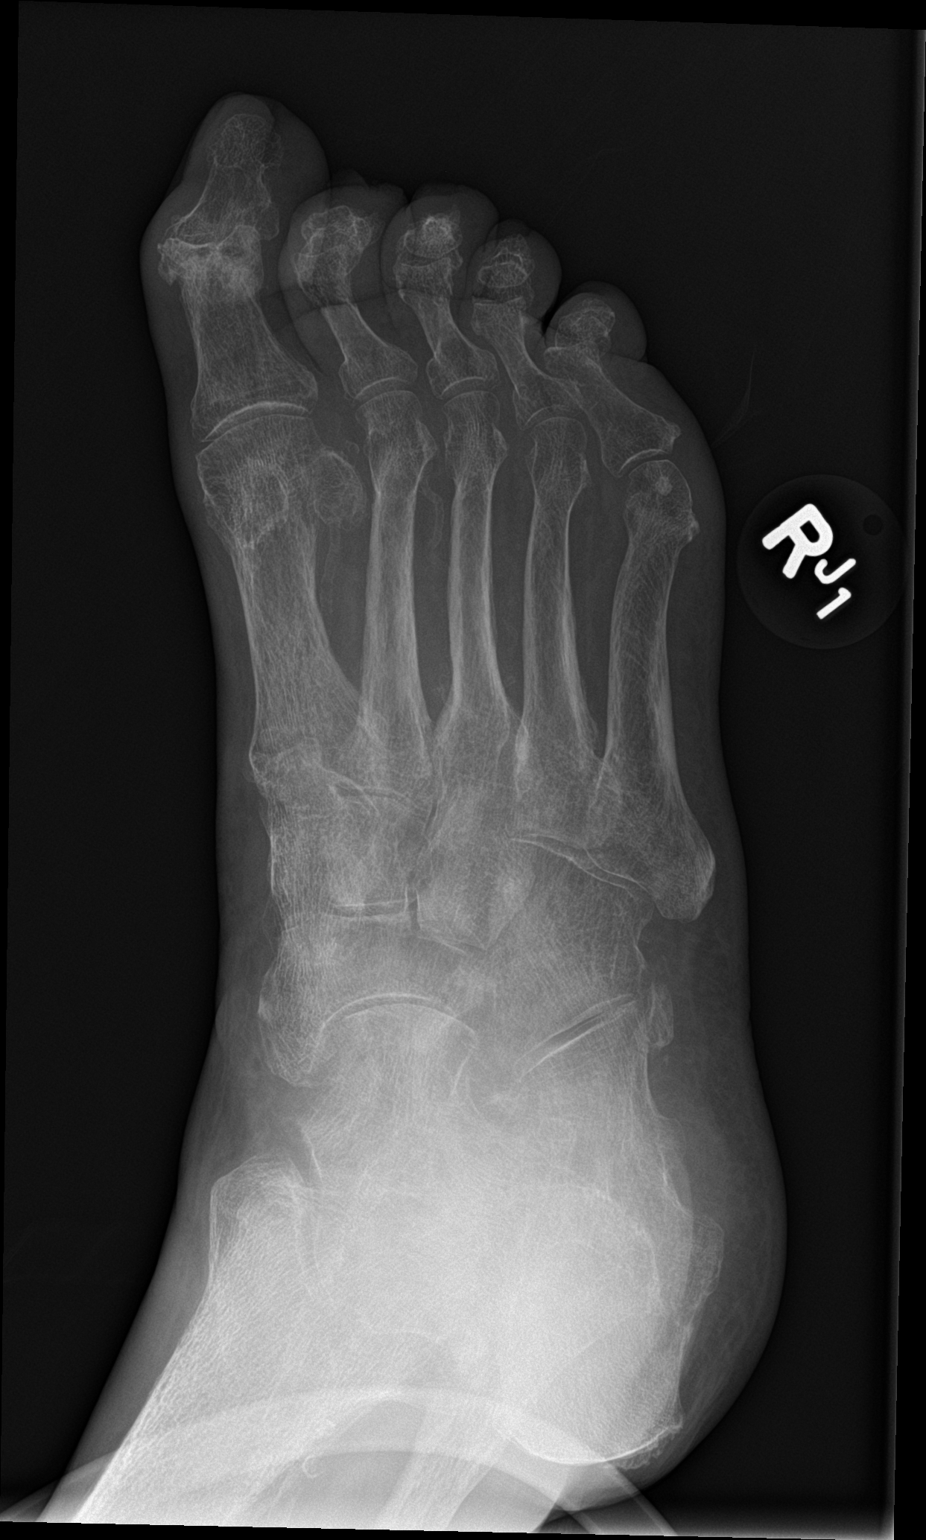

[foot lat]
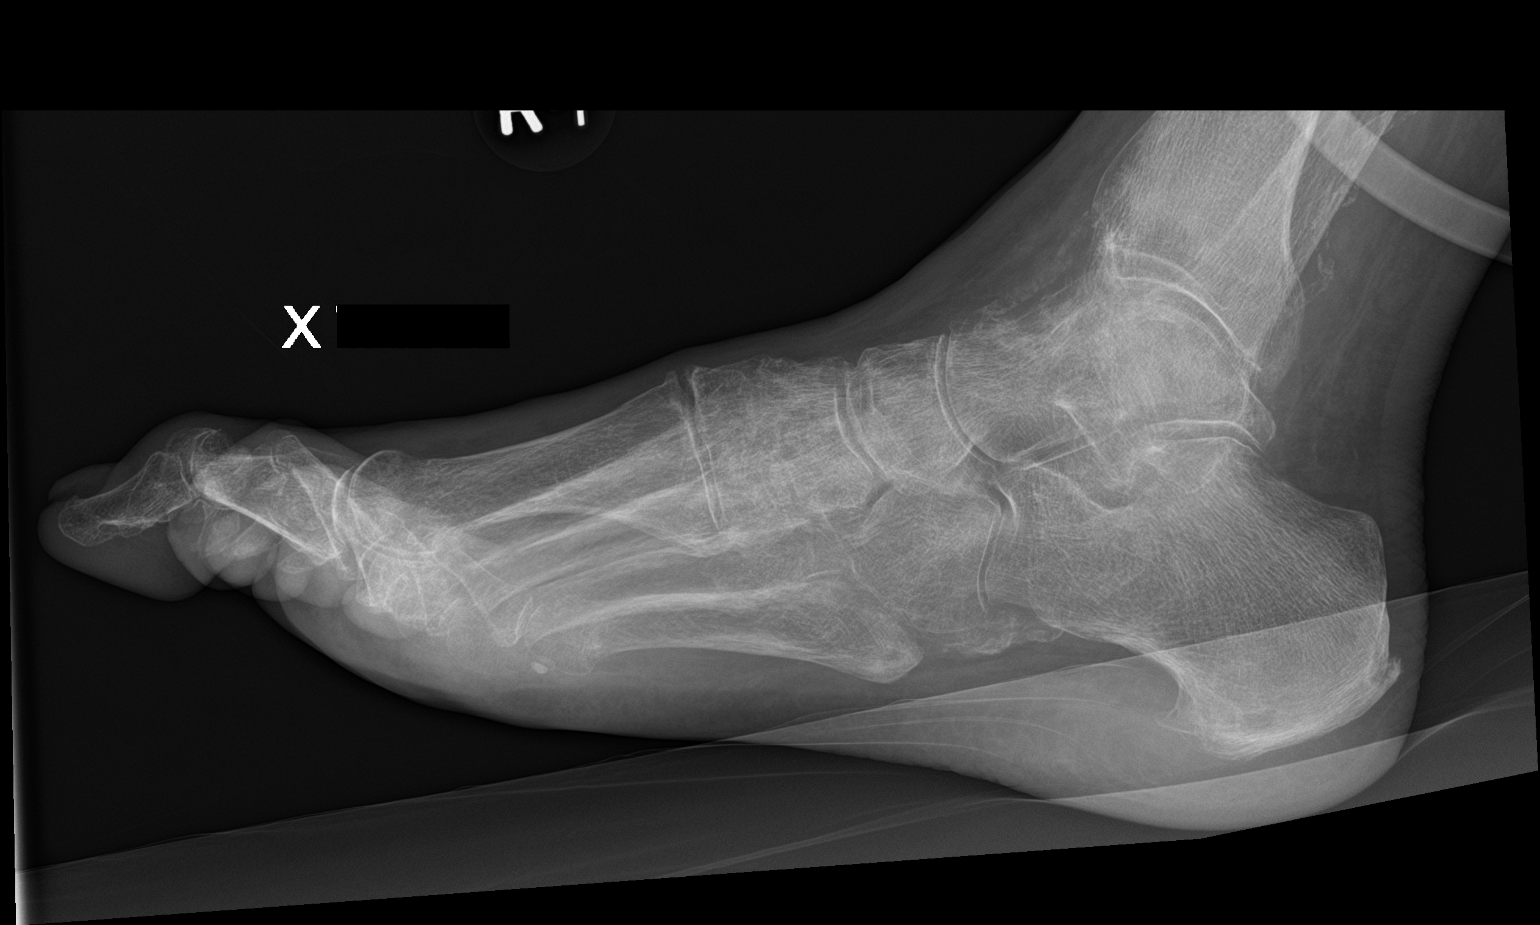

[3 of 3 positions shown; findings below may reference images not displayed]

FINDINGS: Exam demonstrates diffuse decreased bone mineralization. Mild
degenerate changes over the midfoot/hindfoot region and first MTP
joint as well as interphalangeal joints. Small inferior calcaneal
spur. Mild irregularity with sclerosis over the distal aspect of the
first proximal phalanx which may represent a healing subacute
fracture. Small vessel atherosclerotic disease is present.
IMPRESSION: Changes over the distal aspect of the first proximal phalanx which
may indicate a subacute healing fracture.

Mild degenerative changes.  Small calcaneal spur.

## 2016-04-09 IMAGING — DX DG FOOT COMPLETE 3+V*L*
3 series · 3 of 3 positions shown · non-contrast
Comparison: July 22, 2014.

CLINICAL DATA: Chronic left foot pain without known injury.

EXAM:
LEFT FOOT - COMPLETE 3+ VIEW

[foot ap (1 of 2)]
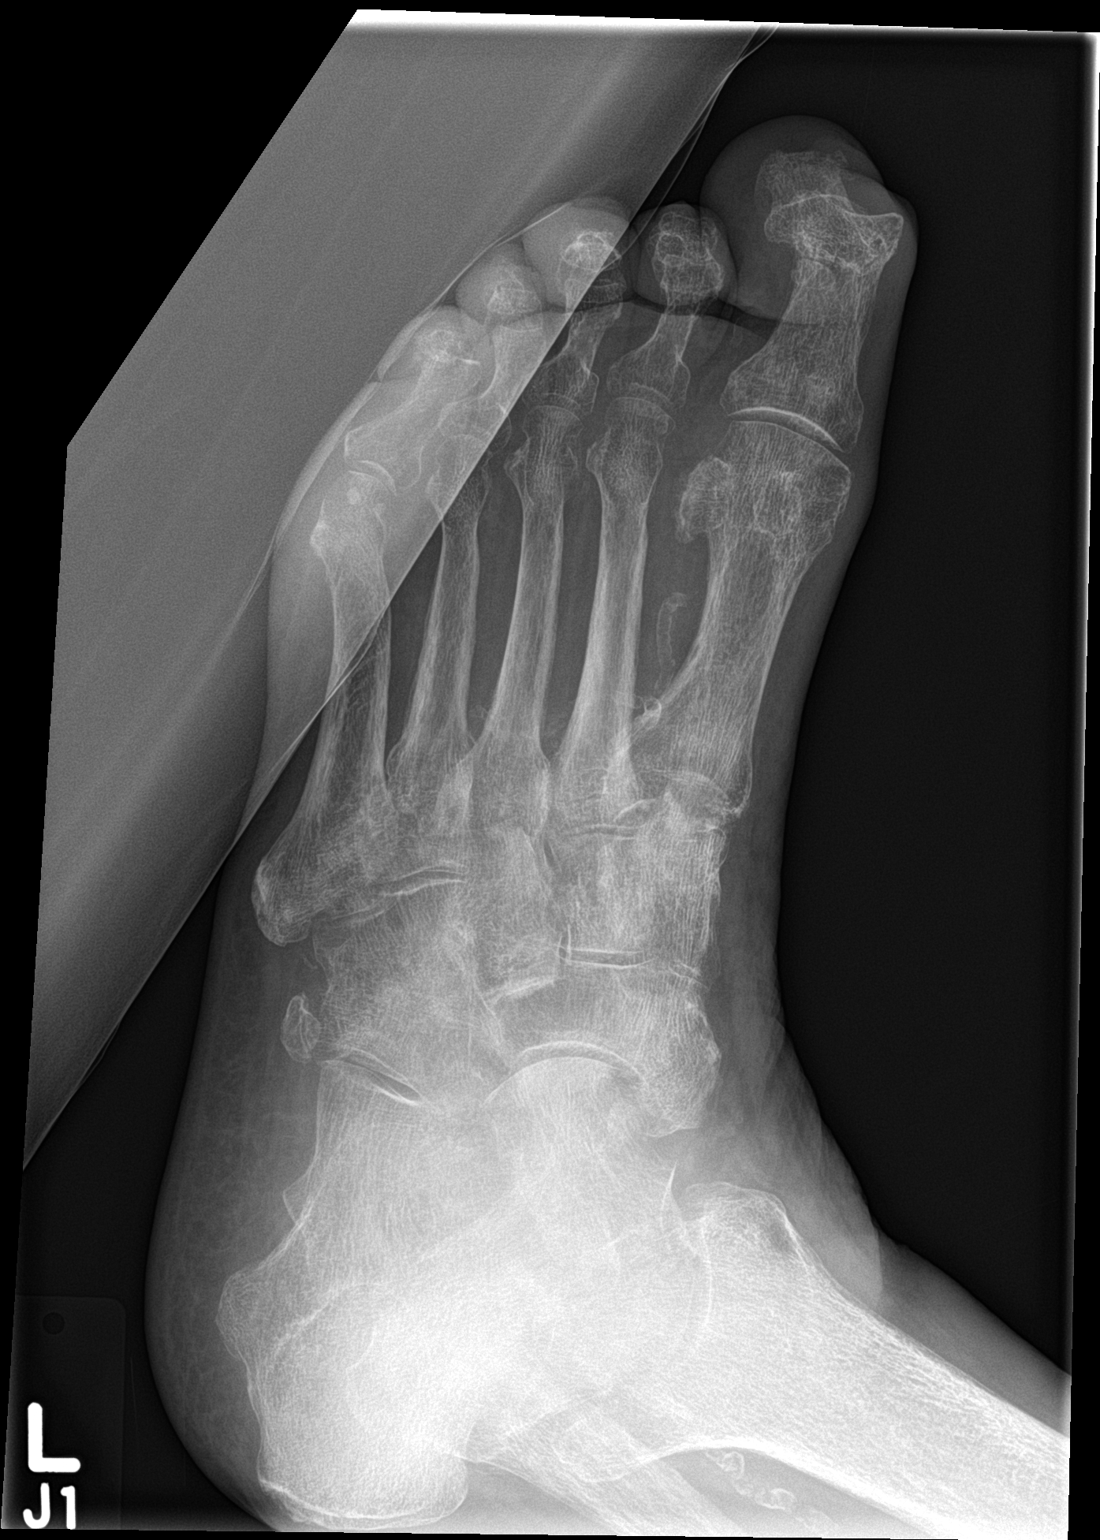

[foot lat]
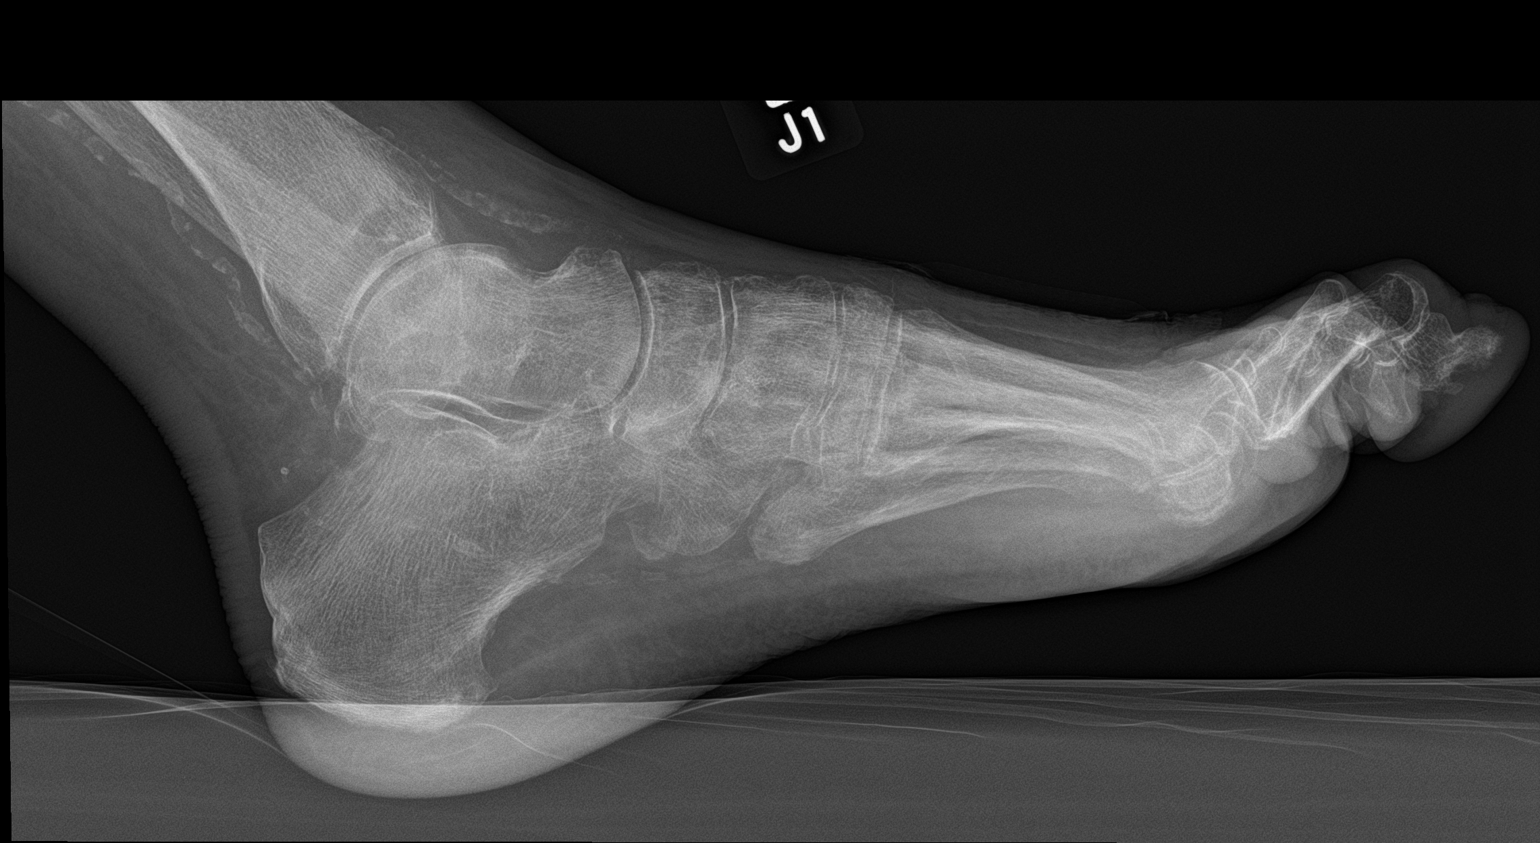

[foot ap (2 of 2)]
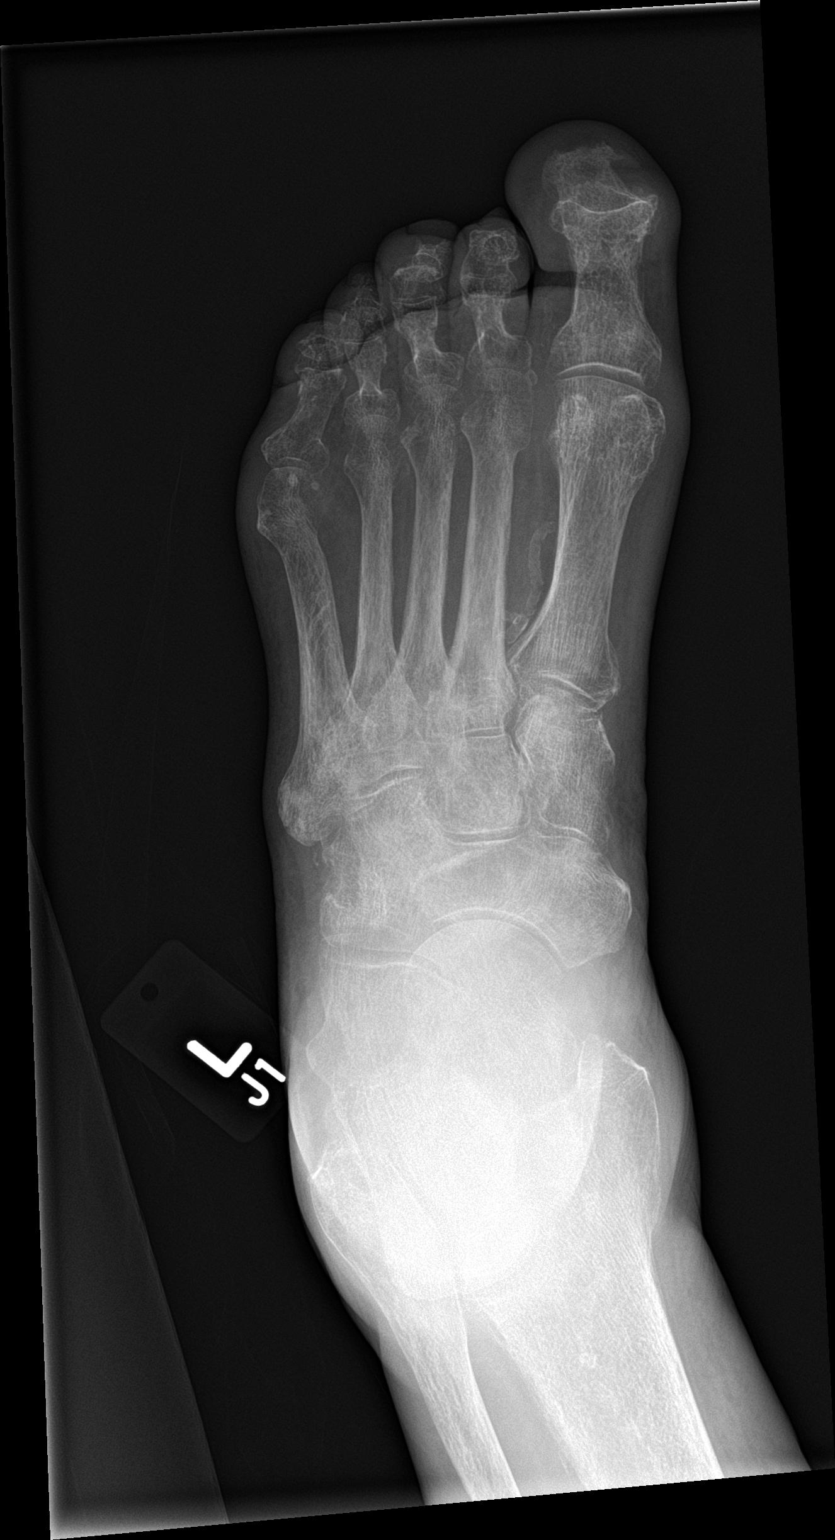

[3 of 3 positions shown; findings below may reference images not displayed]

FINDINGS: Osteopenia is noted. Vascular calcifications are noted. No acute
fracture or dislocation is noted. Irregularity of distal tuft of
first distal phalanx is noted consistent with old osteomyelitis.
IMPRESSION: Irregularity of distal tuft of first distal phalanx is noted which
most likely represents chronic sequela of osteomyelitis noted on
prior exam. No definite acute abnormality is noted in the left foot.

## 2016-04-12 ENCOUNTER — Other Ambulatory Visit: Payer: Self-pay | Admitting: *Deleted

## 2016-04-12 MED ORDER — HYDROCODONE-ACETAMINOPHEN 5-325 MG PO TABS: ORAL_TABLET | ORAL | 0 refills | Status: DC

## 2016-04-12 NOTE — Telephone Encounter (Signed)
[facility] #[phone] Fax: [phone]   

## 2016-04-13 ENCOUNTER — Emergency Department (HOSPITAL_COMMUNITY): Payer: Medicare Other

## 2016-04-13 ENCOUNTER — Inpatient Hospital Stay (HOSPITAL_COMMUNITY)
Admission: EM | Admit: 2016-04-13 | Discharge: 2016-04-17 | DRG: 193 | Disposition: A | Discharge status: Home / Self Care | Payer: Medicare Other | Attending: Internal Medicine | Admitting: Internal Medicine

## 2016-04-13 ENCOUNTER — Encounter (HOSPITAL_COMMUNITY): Payer: Self-pay

## 2016-04-13 DIAGNOSIS — Z85828 Personal history of other malignant neoplasm of skin: Secondary | ICD-10-CM | POA: Diagnosis not present

## 2016-04-13 DIAGNOSIS — G2581 Restless legs syndrome: Secondary | ICD-10-CM | POA: Diagnosis present

## 2016-04-13 DIAGNOSIS — Z8673 Personal history of transient ischemic attack (TIA), and cerebral infarction without residual deficits: Secondary | ICD-10-CM | POA: Diagnosis not present

## 2016-04-13 DIAGNOSIS — I110 Hypertensive heart disease with heart failure: Secondary | ICD-10-CM | POA: Diagnosis present

## 2016-04-13 DIAGNOSIS — J9601 Acute respiratory failure with hypoxia: Secondary | ICD-10-CM | POA: Diagnosis present

## 2016-04-13 DIAGNOSIS — Z8261 Family history of arthritis: Secondary | ICD-10-CM | POA: Diagnosis not present

## 2016-04-13 DIAGNOSIS — M1612 Unilateral primary osteoarthritis, left hip: Secondary | ICD-10-CM | POA: Diagnosis present

## 2016-04-13 DIAGNOSIS — Z66 Do not resuscitate: Secondary | ICD-10-CM | POA: Diagnosis present

## 2016-04-13 DIAGNOSIS — I509 Heart failure, unspecified: Secondary | ICD-10-CM | POA: Diagnosis present

## 2016-04-13 DIAGNOSIS — R0789 Other chest pain: Secondary | ICD-10-CM | POA: Diagnosis not present

## 2016-04-13 DIAGNOSIS — K219 Gastro-esophageal reflux disease without esophagitis: Secondary | ICD-10-CM | POA: Diagnosis present

## 2016-04-13 DIAGNOSIS — L8990 Pressure ulcer of unspecified site, unspecified stage: Secondary | ICD-10-CM | POA: Diagnosis present

## 2016-04-13 DIAGNOSIS — Z9889 Other specified postprocedural states: Secondary | ICD-10-CM

## 2016-04-13 DIAGNOSIS — Z7982 Long term (current) use of aspirin: Secondary | ICD-10-CM | POA: Diagnosis not present

## 2016-04-13 DIAGNOSIS — Y95 Nosocomial condition: Secondary | ICD-10-CM | POA: Diagnosis present

## 2016-04-13 DIAGNOSIS — R05 Cough: Secondary | ICD-10-CM | POA: Diagnosis not present

## 2016-04-13 DIAGNOSIS — Z79899 Other long term (current) drug therapy: Secondary | ICD-10-CM

## 2016-04-13 DIAGNOSIS — Z888 Allergy status to other drugs, medicaments and biological substances status: Secondary | ICD-10-CM

## 2016-04-13 DIAGNOSIS — I255 Ischemic cardiomyopathy: Secondary | ICD-10-CM | POA: Diagnosis present

## 2016-04-13 DIAGNOSIS — Z886 Allergy status to analgesic agent status: Secondary | ICD-10-CM | POA: Diagnosis not present

## 2016-04-13 DIAGNOSIS — I482 Chronic atrial fibrillation: Secondary | ICD-10-CM | POA: Diagnosis not present

## 2016-04-13 DIAGNOSIS — R061 Stridor: Secondary | ICD-10-CM | POA: Diagnosis not present

## 2016-04-13 DIAGNOSIS — Z87442 Personal history of urinary calculi: Secondary | ICD-10-CM | POA: Diagnosis not present

## 2016-04-13 DIAGNOSIS — R079 Chest pain, unspecified: Secondary | ICD-10-CM | POA: Diagnosis not present

## 2016-04-13 DIAGNOSIS — T17908A Unspecified foreign body in respiratory tract, part unspecified causing other injury, initial encounter: Secondary | ICD-10-CM

## 2016-04-13 DIAGNOSIS — R230 Cyanosis: Secondary | ICD-10-CM | POA: Diagnosis present

## 2016-04-13 DIAGNOSIS — J189 Pneumonia, unspecified organism: Secondary | ICD-10-CM | POA: Diagnosis not present

## 2016-04-13 DIAGNOSIS — R8290 Unspecified abnormal findings in urine: Secondary | ICD-10-CM | POA: Diagnosis present

## 2016-04-13 DIAGNOSIS — R1310 Dysphagia, unspecified: Secondary | ICD-10-CM | POA: Diagnosis not present

## 2016-04-13 LAB — COMPREHENSIVE METABOLIC PANEL
ALBUMIN: 2.8 g/dL — AB (ref 3.5–5.0)
ALT: 19 U/L (ref 17–63)
AST: 23 U/L (ref 15–41)
Alkaline Phosphatase: 80 U/L (ref 38–126)
Anion gap: 9 (ref 5–15)
BUN: 17 mg/dL (ref 6–20)
CO2: 21 mmol/L — ABNORMAL LOW (ref 22–32)
Calcium: 8.2 mg/dL — ABNORMAL LOW (ref 8.9–10.3)
Chloride: 102 mmol/L (ref 101–111)
Creatinine, Ser: 1.24 mg/dL (ref 0.61–1.24)
GFR calc Af Amer: 53 mL/min — ABNORMAL LOW (ref >60)
GFR calc non Af Amer: 46 mL/min — ABNORMAL LOW (ref >60)
GLUCOSE: 131 mg/dL — AB (ref 65–99)
POTASSIUM: 4.2 mmol/L (ref 3.5–5.1)
SODIUM: 132 mmol/L — AB (ref 135–145)
Total Bilirubin: 0.6 mg/dL (ref 0.3–1.2)
Total Protein: 7 g/dL (ref 6.5–8.1)

## 2016-04-13 LAB — CBC WITH DIFFERENTIAL/PLATELET
Basophils Absolute: 0 10*3/uL (ref 0.0–0.1)
Basophils Relative: 0 %
EOS PCT: 0 %
Eosinophils Absolute: 0 10*3/uL (ref 0.0–0.7)
HCT: 31.4 % — ABNORMAL LOW (ref 39.0–52.0)
Hemoglobin: 10.3 g/dL — ABNORMAL LOW (ref 13.0–17.0)
LYMPHS ABS: 1.4 10*3/uL (ref 0.7–4.0)
LYMPHS PCT: 9 %
MCH: 27 pg (ref 26.0–34.0)
MCHC: 32.8 g/dL (ref 30.0–36.0)
MCV: 82.4 fL (ref 78.0–100.0)
MONO ABS: 1.8 10*3/uL — AB (ref 0.1–1.0)
Monocytes Relative: 11 %
Neutro Abs: 12.5 10*3/uL — ABNORMAL HIGH (ref 1.7–7.7)
Neutrophils Relative %: 80 %
PLATELETS: 358 10*3/uL (ref 150–400)
RBC: 3.81 MIL/uL — AB (ref 4.22–5.81)
RDW: 14.4 % (ref 11.5–15.5)
WBC: 15.7 10*3/uL — AB (ref 4.0–10.5)

## 2016-04-13 LAB — I-STAT CG4 LACTIC ACID, ED: Lactic Acid, Venous: 0.83 mmol/L (ref 0.5–1.9)

## 2016-04-13 LAB — I-STAT TROPONIN, ED: Troponin i, poc: 0 ng/mL (ref 0.00–0.08)

## 2016-04-13 MED ORDER — DEXTROSE 5 % IV SOLN
2.0000 g | Freq: Once | INTRAVENOUS | Status: AC
  Administered 2016-04-13: 2 g via INTRAVENOUS
  Filled 2016-04-13: qty 2

## 2016-04-13 MED ORDER — VANCOMYCIN HCL IN DEXTROSE 1-5 GM/200ML-% IV SOLN
1000.0000 mg | Freq: Once | INTRAVENOUS | Status: AC
  Administered 2016-04-13: 1000 mg via INTRAVENOUS
  Filled 2016-04-13: qty 200

## 2016-04-13 NOTE — H&P (Signed)
History and Physical    Patrick [name] [id number] DOB: 03/14/1913 DOA: 04/13/2016  PCP: [name] Patient  Patient coming from: weak, cyanotic at the SNF.   Chief Complaint:  Weakness.    HPI: Patrick [name] is an 83 y.o. male SNF resident with DNR Code status, hx of anxiety, HTN, CHF, ischemic CMP, brought to the ER with respiratory problem.  He was found cyanotic.  In the ER, he felt better with supplemental [place] oxygen.  Work up included a CXR with PNA, and leukocytosis with WBC of 15K, lactic acid was normal, and Cr was 1.2.  He was started on IV Cefepime and IV Vancomycin.  BC were obtained.  EDP spoke with family and also gotten information that he hadn't been eating well, and decision was made to admit him into the hospital for HCAP.     ED Course:  See above.  Rewiew of Systems:  Constitutional: Negative for malaise, fever and chills. No significant weight loss or weight gain Eyes: Negative for eye pain, redness and discharge, diplopia, visual changes, or flashes of light. ENMT: Negative for ear pain, hoarseness, nasal congestion, sinus pressure and sore throat. No headaches; tinnitus, drooling, or problem swallowing. Cardiovascular: Negative for chest pain, palpitations, diaphoresis, and peripheral edema. ; No orthopnea, PND Respiratory: Negative for cough, hemoptysis, wheezing and stridor. No pleuritic chestpain. Gastrointestinal: Negative for diarrhea, constipation,  melena, blood in stool, hematemesis, jaundice and rectal bleeding.    Genitourinary: Negative for frequency, dysuria, incontinence,flank pain and hematuria; Musculoskeletal: Negative for back pain and neck pain. Negative for swelling and trauma.;  Skin: . Negative for pruritus, rash, abrasions, bruising and skin lesion.; ulcerations Neuro: Negative for headache, lightheadedness and neck stiffness.  altered mental status, extremity weakness, burning feet, involuntary movement, seizure and syncope.  Psych:  negative for anxiety, depression, insomnia, tearfulness, panic attacks, hallucinations, paranoia, suicidal or homicidal ideation   Past Medical History:  Diagnosis Date  . Anxiety   . Bilateral foot pain   . CHF (congestive heart failure) ([place])   . Diverticulitis   . DJD (degenerative joint disease), cervical   . DNR (do not resuscitate)   . Esophageal dysmotility    age related per BPE 04/2015  . Esophageal stricture   . GERD (gastroesophageal reflux disease)   . Gout   . Hiatal hernia   . History of recurrent TIAs   . HTN (hypertension)   . Hyperlipidemia   . Inguinal hernia   . Ischemic heart disease   . Kidney stone   . Osteoarthritis    bilat knees  . Pleural effusion   . Reflux   . Restless leg syndrome   . Right hip pain   . Skin cancer, basal cell   . Urinary retention     Past Surgical History:  Procedure Laterality Date  . ESOPHAGEAL DILATION    . EXPLORATORY LAPAROTOMY W/ BOWEL RESECTION    . FRACTURE SURGERY    . HEMIARTHROPLASTY HIP     Dr. Aline Brochure  . intestines       reports that he has never smoked. He has never used smokeless tobacco. He reports that he does not drink alcohol or use drugs.  Allergies  Allergen Reactions  . Celebrex [Celecoxib] Other (See Comments)    Unknown; patient can't remember  . Codeine Other (See Comments)    nausea    Family History  Problem Relation Age of Onset  . Arthritis       Prior to  Admission medications   Medication Sig Start Date End Date Taking? Authorizing Provider  aspirin EC 81 MG tablet Take 81 mg by mouth daily.   Yes Historical Provider, MD  CAMPHOR-EUCALYPTUS-MENTHOL EX Apply 1 application topically 2 (two) times daily as needed.    Yes Historical Provider, MD  Cholecalciferol (VITAMIN D) 2000 UNITS CAPS Take 1 capsule by mouth daily.   Yes Historical Provider, MD  docusate sodium (COLACE) 100 MG capsule Take 100 mg by mouth daily. For constipation.   Yes Historical Provider, MD  guaifenesin  (ROBITUSSIN) 100 MG/5ML syrup Take 300 mg by mouth every 6 (six) hours as needed for cough or congestion.    Yes Historical Provider, MD  HYDROcodone-acetaminophen (NORCO/VICODIN) 5-325 MG tablet Take one tablet by mouth twice daily and every 6 hours as needed for pain. Max APAP 3gm/24 hours from all sources Patient taking differently: Take 1 tablet by mouth 2 (two) times daily. Take one tablet by mouth twice daily and every 6 hours as needed for pain. Max APAP 3gm/24 hours from all sources 04/12/16  Yes Estill Dooms, MD  magnesium hydroxide (MILK OF MAGNESIA) 400 MG/5ML suspension Take 30 mLs by mouth daily as needed for mild constipation. If no relief try a Fleets enema, after 2 days.   Yes Historical Provider, MD  Menthol 4.8 MG LOZG Use as directed 4.8 mg in the mouth or throat every 4 (four) hours as needed (for sore throat and cough).    Yes Historical Provider, MD  NON FORMULARY Take by mouth 2 (two) times daily. MAGIC CUP   Yes Historical Provider, MD  omeprazole (PRILOSEC) 20 MG capsule Take 20 mg by mouth daily.   Yes Historical Provider, MD  rOPINIRole (REQUIP) 2 MG tablet Take 2 mg by mouth at bedtime.   Yes Historical Provider, MD  sertraline (ZOLOFT) 25 MG tablet Take 37.5 mg by mouth daily.    Yes Historical Provider, MD    Physical Exam: Vitals:   04/13/16 2146 04/13/16 2151 04/13/16 2230  BP:  118/59 (!) 105/53  Pulse:  88 80  Resp:  18 19  Temp:  98.5 F (36.9 C)   TempSrc:  Oral   SpO2:  97% 91%  Weight: 80.3 kg (177 lb)        Constitutional: NAD, calm, comfortable Vitals:   04/13/16 2146 04/13/16 2151 04/13/16 2230  BP:  118/59 (!) 105/53  Pulse:  88 80  Resp:  18 19  Temp:  98.5 F (36.9 C)   TempSrc:  Oral   SpO2:  97% 91%  Weight: 80.3 kg (177 lb)     Eyes: PERRL, lids and conjunctivae normal ENMT: Mucous membranes are moist. Posterior pharynx clear of any exudate or lesions.Normal dentition.  Neck: normal, supple, no masses, no  thyromegaly Respiratory: clear to auscultation bilaterally, no wheezing, no crackles. Normal respiratory effort. No accessory muscle use.  Cardiovascular: Regular rate and rhythm, no murmurs / rubs / gallops. No extremity edema. 2+ pedal pulses. No carotid bruits.  Abdomen: no tenderness, no masses palpated. No hepatosplenomegaly. Bowel sounds positive.  Musculoskeletal: no clubbing / cyanosis. No joint deformity upper and lower extremities. Good ROM, no contractures. Normal muscle tone.  Skin: no rashes, lesions, ulcers. No induration Neurologic: CN 2-12 grossly intact. Sensation intact, DTR normal. Strength 5/5 in all 4.    Labs on Admission: I have personally reviewed following labs and imaging studies CBC:  Recent Labs Lab 04/13/16 2220  WBC 15.7*  NEUTROABS 12.5*  HGB  10.3*  HCT 31.4*  MCV 82.4  PLT [id number]   Basic Metabolic Panel:  Recent Labs Lab 04/13/16 2220  NA 132*  K 4.2  CL 102  CO2 21*  GLUCOSE 131*  BUN 17  CREATININE 1.24  CALCIUM 8.2*   Liver Function Tests:  Recent Labs Lab 04/13/16 2220  AST 23  ALT 19  ALKPHOS 80  BILITOT 0.6  PROT 7.0  ALBUMIN 2.8*   Urine analysis:    Component Value Date/Time   COLORURINE STRAW (A) 04/03/2016 0400   APPEARANCEUR CLEAR 04/03/2016 0400   LABSPEC 1.005 04/03/2016 0400   PHURINE 7.0 04/03/2016 0400   GLUCOSEU NEGATIVE 04/03/2016 0400   HGBUR MODERATE (A) 04/03/2016 0400   BILIRUBINUR NEGATIVE 04/03/2016 0400   KETONESUR NEGATIVE 04/03/2016 0400   PROTEINUR 30 (A) 04/03/2016 0400   UROBILINOGEN 0.2 12/29/2014 0245   NITRITE NEGATIVE 04/03/2016 0400   LEUKOCYTESUR LARGE (A) 04/03/2016 0400    Recent Results (from the past 240 hour(s))  Culture, blood (routine x 2)     Status: None (Preliminary result)   Collection Time: 04/13/16 11:14 PM  Result Value Ref Range Status   Specimen Description RIGHT ANTECUBITAL  Final   Special Requests BOTTLES DRAWN AEROBIC AND ANAEROBIC [place]  Final   Culture PENDING   Incomplete   Report Status PENDING  Incomplete  Culture, blood (routine x 2)     Status: None (Preliminary result)   Collection Time: 04/13/16 11:21 PM  Result Value Ref Range Status   Specimen Description LEFT ANTECUBITAL  Final   Special Requests BOTTLES DRAWN AEROBIC AND ANAEROBIC [facility]  Final   Culture PENDING  Incomplete   Report Status PENDING  Incomplete     Radiological Exams on Admission: Dg Chest Port 1 View  Result Date: 04/13/2016 CLINICAL DATA:  Left-sided chest pain and cough for 2 days. EXAM: PORTABLE CHEST 1 VIEW COMPARISON:  Chest radiograph 04/28/2015 FINDINGS: Patchy and confluent left lung base opacities suspicious for pneumonia. Streaky right infrahilar opacities likely atelectasis. Mild cardiomegaly with unchanged aortic tortuosity and atherosclerosis. There is mild vascular congestion without overt pulmonary edema. Possible small left pleural effusion. No pneumothorax. Stable osseous structures. IMPRESSION: Patchy and confluent left lung base opacity suspicious for pneumonia. Right infrahilar atelectasis. Electronically Signed   By: Jeb Levering M.D.   On: 04/13/2016 22:47    EKG: Independently reviewed.   Assessment/Plan Principal Problem:   Healthcare-associated pneumonia Active Problems:   CHF (congestive heart failure) (HCC)   Osteoarthritis of left hip    PLAN:   HCAP:  Agree with Tx IV Van/Cefepime.  BCs were obtained.  Continue with supplemental oxygen as required.  Will maintain DNR Code status.  Hx of CHF:  He is well compensated and apears comfortable with his breathing when I saw him.  Arthritis of the knee and hip:  Will use tylenol PRN for pain.   DVT prophylaxis: Heparin SQ.  Code Status: DNR with [place] form.  Family Communication: EDP with family.  Disposition Plan: to SNF when appropriate.  Consults called: None.  Admission status: Inpatient due to concomitant morbitity, having PNA and will likely require a few days of IV  antibiotics.    Tanny Harnack MD FACP. [facility]  If 7PM-7AM, please contact night-coverage [email or web address] Password [email or web address]  04/14/2016, 12:00 AM

## 2016-04-13 NOTE — ED Triage Notes (Signed)
[place] reported to EMS that the patient was turning blue, complaining of chest pain, and his sats were in the 40s.  Sats were 94% when EMS arrived on scene.  Patient has bruised area noted to his chest.

## 2016-04-13 NOTE — ED Provider Notes (Signed)
Emergency Department Provider Note   I have reviewed the triage vital signs and the nursing notes.  By signing my name below, I, Patrick [name], attest that this documentation has been prepared under the direction and in the presence of Patrick Fast, MD. Electronically signed, Patrick [name], ED Scribe. 04/13/16. 10:17 PM.   HISTORY  Chief Complaint Chest Pain  HPI Comments: Patrick [name] is a 83 y.o. male BIB EMS who presents to the Emergency Department complaining of gradually worsening left chest pain x 2 days. He notes no Hx of similar, and he states that he is not on supplemental oxygen at home. Pt reports associated cough x 2-3 days. Triage notes [facility] contacted EMS complaining that the pt was cyanotic with SPO2 ~40%. Triage further notes EMS reported SPO2 of 94% when they arrived to the scene. Pt denies fever and appetite change.  Level 5 caveat: Patient is drowsy very fatigued.   Past Medical History:  Diagnosis Date  . Anxiety   . Bilateral foot pain   . CHF (congestive heart failure) ([place])   . Diverticulitis   . DJD (degenerative joint disease), cervical   . DNR (do not resuscitate)   . Esophageal dysmotility    age related per BPE 04/2015  . Esophageal stricture   . GERD (gastroesophageal reflux disease)   . Gout   . Hiatal hernia   . History of recurrent TIAs   . HTN (hypertension)   . Hyperlipidemia   . Inguinal hernia   . Ischemic heart disease   . Kidney stone   . Osteoarthritis    bilat knees  . Pleural effusion   . Reflux   . Restless leg syndrome   . Right hip pain   . Skin cancer, basal cell   . Urinary retention     Patient Active Problem List   Diagnosis Date Noted  . HCAP (healthcare-associated pneumonia) 04/14/2016  . Anemia 08/20/2015  . Rash and nonspecific skin eruption 08/19/2015  . Osteoarthritis of left hip 08/03/2015  . Ear lesion 07/07/2015  . Pressure ulcer 04/29/2015  . Dysphagia 04/28/2015  . Nausea 04/28/2015    . History of gout 04/08/2015  . AKI (acute kidney injury) ([place]) 12/06/2014  . Sepsis ([place]) 12/05/2014  . Healthcare-associated pneumonia 12/05/2014  . Hypotension 10/07/2014  . Edema 07/21/2014  . Cellulitis 07/21/2014  . Hypotension, unspecified 09/01/2013  . CHF (congestive heart failure) ([place]) 08/22/2013  . UTI (urinary tract infection) 08/22/2013  . Unspecified constipation 08/22/2013  . GERD (gastroesophageal reflux disease) 08/20/2013  . Tachycardia 08/20/2013  . BPH (benign prostatic hyperplasia) 03/10/2013  . Restless legs 03/10/2013  . Depression 03/10/2013  . Insomnia 03/07/2013  . Arthritis of knee, degenerative 12/22/2010    Past Surgical History:  Procedure Laterality Date  . ESOPHAGEAL DILATION    . EXPLORATORY LAPAROTOMY W/ BOWEL RESECTION    . FRACTURE SURGERY    . HEMIARTHROPLASTY HIP     Dr. Aline Brochure  . intestines        Allergies Celebrex [celecoxib] and Codeine  Family History  Problem Relation Age of Onset  . Arthritis      Social History Social History  Substance Use Topics  . Smoking status: Never Smoker  . Smokeless tobacco: Never Used  . Alcohol use No    Review of Systems  10-point ROS otherwise negative.  ____________________________________________   PHYSICAL EXAM:  VITAL SIGNS: ED Triage Vitals  Enc Vitals Group     BP 04/13/16 2151  118/59     Pulse Rate 04/13/16 2151 88     Resp 04/13/16 2151 18     Temp 04/13/16 2151 98.5 F (36.9 C)     Temp Source 04/13/16 2151 Oral     SpO2 04/13/16 2151 97 %     Weight 04/13/16 2146 177 lb (80.3 kg)     Pain Score 04/13/16 2146 4   Constitutional: Drowsy. Able to provide some history but detail is limited.  Eyes: Conjunctivae are normal.  Head: Atraumatic. Nose: No congestion/rhinnorhea. Mouth/Throat: Mucous membranes are moist.   Neck: No stridor.  Cardiovascular: Normal rate, regular rhythm. Good peripheral circulation. Grossly normal heart sounds.   Respiratory:  Normal respiratory effort.  No retractions. Lungs with crackles on the left. Gastrointestinal: Soft and nontender. No distention.  Musculoskeletal: No lower extremity tenderness nor edema. No gross deformities of extremities. Neurologic:  No gross focal neurologic deficits are appreciated.  Skin:  Skin is warm, dry and intact. No rash noted.  ____________________________________________   DIAGNOSTIC STUDIES: Oxygen Saturation is 97% on mask, adequate by my interpretation.    COORDINATION OF CARE: 10:17 PM Discussed treatment plan with pt at bedside and pt agreed to plan. Will order imaging and labs.  LABS (all labs ordered are listed, but only abnormal results are displayed)  Labs Reviewed  MRSA PCR SCREENING - Abnormal; Notable for the following:       Result Value   MRSA by PCR POSITIVE (*)    All other components within normal limits  COMPREHENSIVE METABOLIC PANEL - Abnormal; Notable for the following:    Sodium 132 (*)    CO2 21 (*)    Glucose, Bld 131 (*)    Calcium 8.2 (*)    Albumin 2.8 (*)    GFR calc non Af Amer 46 (*)    GFR calc Af Amer 53 (*)    All other components within normal limits  CBC WITH DIFFERENTIAL/PLATELET - Abnormal; Notable for the following:    WBC 15.7 (*)    RBC 3.81 (*)    Hemoglobin 10.3 (*)    HCT 31.4 (*)    Neutro Abs 12.5 (*)    Monocytes Absolute 1.8 (*)    All other components within normal limits  URINALYSIS, ROUTINE W REFLEX MICROSCOPIC - Abnormal; Notable for the following:    Color, Urine AMBER (*)    APPearance CLOUDY (*)    Hgb urine dipstick SMALL (*)    Protein, ur 100 (*)    Leukocytes, UA LARGE (*)    All other components within normal limits  URINALYSIS, MICROSCOPIC (REFLEX) - Abnormal; Notable for the following:    Bacteria, UA RARE (*)    All other components within normal limits  COMPREHENSIVE METABOLIC PANEL - Abnormal; Notable for the following:    Sodium 133 (*)    Chloride 99 (*)    Glucose, Bld 121 (*)     Calcium 8.3 (*)    Albumin 2.6 (*)    ALT 16 (*)    GFR calc non Af Amer 54 (*)    All other components within normal limits  CBC - Abnormal; Notable for the following:    WBC 15.2 (*)    RBC 3.63 (*)    Hemoglobin 9.9 (*)    HCT 30.0 (*)    All other components within normal limits  CULTURE, BLOOD (ROUTINE X 2)  CULTURE, BLOOD (ROUTINE X 2)  URINE CULTURE  INFLUENZA PANEL BY PCR (TYPE A &  B)  TSH  I-STAT TROPOININ, ED  I-STAT CG4 LACTIC ACID, ED   ____________________________________________  EKG   EKG Interpretation  Date/Time:  Wednesday April 13 2016 22:11:34 EST Ventricular Rate:  83 PR Interval:    QRS Duration: 100 QT Interval:  385 QTC Calculation: 453 R Axis:   -13 Text Interpretation:  Sinus rhythm Atrial premature complex Minimal ST elevation, inferior leads No STEMI.  Confirmed by Joy Reiger MD, Kalix Meinecke [id number]) on 04/13/2016 10:46:42 PM       ____________________________________________  RADIOLOGY  Dg Chest Port 1 View  Result Date: 04/13/2016 CLINICAL DATA:  Left-sided chest pain and cough for 2 days. EXAM: PORTABLE CHEST 1 VIEW COMPARISON:  Chest radiograph 04/28/2015 FINDINGS: Patchy and confluent left lung base opacities suspicious for pneumonia. Streaky right infrahilar opacities likely atelectasis. Mild cardiomegaly with unchanged aortic tortuosity and atherosclerosis. There is mild vascular congestion without overt pulmonary edema. Possible small left pleural effusion. No pneumothorax. Stable osseous structures. IMPRESSION: Patchy and confluent left lung base opacity suspicious for pneumonia. Right infrahilar atelectasis. Electronically Signed   By: Jeb Levering M.D.   On: 04/13/2016 22:47    ____________________________________________   PROCEDURES  Procedure(s) performed:   Procedures  None ____________________________________________   INITIAL IMPRESSION / ASSESSMENT AND PLAN / ED COURSE  Pertinent labs & imaging results that were  available during my care of the patient were reviewed by me and considered in my medical decision making (see chart for details).  Patient presents to the ED with CP, cough, and hypoxemia. He is drowsy but in no acute distress. O2 88% on RA when I turn it off in the room. Paitent has DNR paperwork at bedside. Clinical suspicion elevated for PNA vs ACS. Plan for labs and CXR.  11:10 PM Updated family by phone regarding the patient's presentation to the emergency department and symptoms. Patient has a developing right-sided pneumonia which may explain many of his symptoms including the hypoxemia and chest discomfort. They report he has not been eating much over the past week. Confirmed the patient is DNR. He is currently requiring 2L O2 by [place] with desaturation to the 88% on RA. Started abx for HCAP given his presentation from SNF.   Discussed patient's case with hospitalist, Dr. Marin Comment. Patient and family (if present) updated with plan. Care transferred to hospitalist service.  I reviewed all nursing notes, vitals, pertinent old records, EKGs, labs, imaging (as available). ____________________________________________  FINAL CLINICAL IMPRESSION(S) / ED DIAGNOSES  Final diagnoses:  HCAP (healthcare-associated pneumonia)     MEDICATIONS GIVEN DURING THIS VISIT:  Medications  rOPINIRole (REQUIP) tablet 2 mg (2 mg Oral Given 04/14/16 0153)  pantoprazole (PROTONIX) EC tablet 40 mg (40 mg Oral Not Given 04/14/16 1243)  docusate sodium (COLACE) capsule 100 mg (100 mg Oral Not Given 04/14/16 1243)  aspirin EC tablet 81 mg (81 mg Oral Not Given 04/14/16 1243)  sertraline (ZOLOFT) tablet 37.5 mg (37.5 mg Oral Not Given 04/14/16 1246)  heparin injection 5,000 Units (5,000 Units Subcutaneous Given 04/14/16 1243)  sodium chloride flush (NS) 0.9 % injection 3 mL (3 mLs Intravenous Not Given 04/14/16 1251)  acetaminophen (TYLENOL) tablet 650 mg (650 mg Oral Given 04/14/16 0158)    Or  acetaminophen (TYLENOL) suppository  650 mg ( Rectal See Alternative 04/14/16 0158)  ceFEPIme (MAXIPIME) 1 g in dextrose 5 % 50 mL IVPB (not administered)  vancomycin (VANCOCIN) IVPB 750 mg/150 ml premix (not administered)  ceFEPIme (MAXIPIME) 2 g in dextrose 5 % 50 mL IVPB (not  administered)  ceFEPIme (MAXIPIME) 2 g in dextrose 5 % 50 mL IVPB (0 g Intravenous Stopped 04/13/16 2352)  vancomycin (VANCOCIN) IVPB 1000 mg/200 mL premix (0 mg Intravenous Stopped 04/14/16 0046)     NEW OUTPATIENT MEDICATIONS STARTED DURING THIS VISIT:  None   Note:  This document was prepared using Dragon voice recognition software and may include unintentional dictation errors.  Nanda Quinton, MD Emergency Medicine  I personally performed the services described in this documentation, which was scribed in my presence. The recorded information has been reviewed and is accurate.      Patrick Fast, MD 04/14/16 1359

## 2016-04-14 DIAGNOSIS — J189 Pneumonia, unspecified organism: Principal | ICD-10-CM

## 2016-04-14 LAB — COMPREHENSIVE METABOLIC PANEL
ALBUMIN: 2.6 g/dL — AB (ref 3.5–5.0)
ALT: 16 U/L — ABNORMAL LOW (ref 17–63)
AST: 17 U/L (ref 15–41)
Alkaline Phosphatase: 75 U/L (ref 38–126)
Anion gap: 10 (ref 5–15)
BUN: 16 mg/dL (ref 6–20)
CHLORIDE: 99 mmol/L — AB (ref 101–111)
CO2: 24 mmol/L (ref 22–32)
Calcium: 8.3 mg/dL — ABNORMAL LOW (ref 8.9–10.3)
Creatinine, Ser: 1.08 mg/dL (ref 0.61–1.24)
GFR calc Af Amer: >60 mL/min (ref >60)
GFR calc non Af Amer: 54 mL/min — ABNORMAL LOW (ref >60)
GLUCOSE: 121 mg/dL — AB (ref 65–99)
Potassium: 3.8 mmol/L (ref 3.5–5.1)
SODIUM: 133 mmol/L — AB (ref 135–145)
Total Bilirubin: 0.7 mg/dL (ref 0.3–1.2)
Total Protein: 6.8 g/dL (ref 6.5–8.1)

## 2016-04-14 LAB — URINALYSIS, ROUTINE W REFLEX MICROSCOPIC
BILIRUBIN URINE: NEGATIVE
GLUCOSE, UA: NEGATIVE mg/dL
KETONES UR: NEGATIVE mg/dL
Nitrite: NEGATIVE
PROTEIN: 100 mg/dL — AB
Specific Gravity, Urine: 1.023 (ref 1.005–1.030)
pH: 8 (ref 5.0–8.0)

## 2016-04-14 LAB — URINALYSIS, MICROSCOPIC (REFLEX): Squamous Epithelial / LPF: NONE SEEN

## 2016-04-14 LAB — INFLUENZA PANEL BY PCR (TYPE A & B)
INFLAPCR: NEGATIVE
Influenza B By PCR: NEGATIVE

## 2016-04-14 LAB — CBC
HEMATOCRIT: 30 % — AB (ref 39.0–52.0)
HEMOGLOBIN: 9.9 g/dL — AB (ref 13.0–17.0)
MCH: 27.3 pg (ref 26.0–34.0)
MCHC: 33 g/dL (ref 30.0–36.0)
MCV: 82.6 fL (ref 78.0–100.0)
Platelets: 329 10*3/uL (ref 150–400)
RBC: 3.63 MIL/uL — ABNORMAL LOW (ref 4.22–5.81)
RDW: 14.3 % (ref 11.5–15.5)
WBC: 15.2 10*3/uL — ABNORMAL HIGH (ref 4.0–10.5)

## 2016-04-14 LAB — MRSA PCR SCREENING: MRSA BY PCR: POSITIVE — AB

## 2016-04-14 LAB — TSH: TSH: 1.323 u[IU]/mL (ref 0.350–4.500)

## 2016-04-14 MED ORDER — ROPINIROLE HCL 1 MG PO TABS
2.0000 mg | ORAL_TABLET | Freq: Every day | ORAL | Status: DC
  Administered 2016-04-14 – 2016-04-16 (×4): 2 mg via ORAL
  Filled 2016-04-14 (×4): qty 2

## 2016-04-14 MED ORDER — ACETAMINOPHEN 650 MG RE SUPP: 650.0000 mg | Freq: Four times a day (QID) | RECTAL | Status: DC | PRN

## 2016-04-14 MED ORDER — ACETAMINOPHEN 325 MG PO TABS
650.0000 mg | ORAL_TABLET | Freq: Four times a day (QID) | ORAL | Status: DC | PRN
  Administered 2016-04-14 – 2016-04-16 (×4): 650 mg via ORAL
  Filled 2016-04-14 (×3): qty 2

## 2016-04-14 MED ORDER — DEXTROSE 5 % IV SOLN
2.0000 g | INTRAVENOUS | Status: DC
  Administered 2016-04-14: 2 g via INTRAVENOUS
  Filled 2016-04-14: qty 2

## 2016-04-14 MED ORDER — DOCUSATE SODIUM 100 MG PO CAPS
100.0000 mg | ORAL_CAPSULE | Freq: Every day | ORAL | Status: DC
  Administered 2016-04-16: 100 mg via ORAL
  Filled 2016-04-14 (×3): qty 1

## 2016-04-14 MED ORDER — VANCOMYCIN HCL IN DEXTROSE 750-5 MG/150ML-% IV SOLN
750.0000 mg | INTRAVENOUS | Status: DC
  Administered 2016-04-14: 750 mg via INTRAVENOUS
  Filled 2016-04-14 (×4): qty 150

## 2016-04-14 MED ORDER — SERTRALINE HCL 25 MG PO TABS
37.5000 mg | ORAL_TABLET | Freq: Every day | ORAL | Status: DC
  Administered 2016-04-16 – 2016-04-17 (×2): 37.5 mg via ORAL
  Filled 2016-04-14 (×5): qty 1.5

## 2016-04-14 MED ORDER — ASPIRIN EC 81 MG PO TBEC
81.0000 mg | DELAYED_RELEASE_TABLET | Freq: Every day | ORAL | Status: DC
  Administered 2016-04-16 – 2016-04-17 (×2): 81 mg via ORAL
  Filled 2016-04-14 (×4): qty 1

## 2016-04-14 MED ORDER — PANTOPRAZOLE SODIUM 40 MG PO TBEC
40.0000 mg | DELAYED_RELEASE_TABLET | Freq: Every day | ORAL | Status: DC
  Administered 2016-04-16 – 2016-04-17 (×2): 40 mg via ORAL
  Filled 2016-04-14 (×4): qty 1

## 2016-04-14 MED ORDER — SODIUM CHLORIDE 0.9% FLUSH
3.0000 mL | Freq: Two times a day (BID) | INTRAVENOUS | Status: DC
  Administered 2016-04-14 – 2016-04-16 (×5): 3 mL via INTRAVENOUS

## 2016-04-14 MED ORDER — CEFEPIME HCL 1 G IJ SOLR
1.0000 g | INTRAMUSCULAR | Status: DC
  Administered 2016-04-15: 1 g via INTRAVENOUS
  Filled 2016-04-14: qty 1

## 2016-04-14 MED ORDER — HEPARIN SODIUM (PORCINE) 5000 UNIT/ML IJ SOLN
5000.0000 [IU] | Freq: Three times a day (TID) | INTRAMUSCULAR | Status: DC
  Administered 2016-04-14 – 2016-04-17 (×10): 5000 [IU] via SUBCUTANEOUS
  Filled 2016-04-14 (×10): qty 1

## 2016-04-14 NOTE — Clinical Social Work Note (Signed)
Clinical Social Work Assessment  Patient Details  Name: Patrick [name] MRN: [id number] Date of Birth: [birth year]  Date of referral:  04/14/16               Reason for consult:  Discharge Planning                Permission sought to share information with:  Facility Art therapist granted to share information::  Yes, Verbal Permission Granted  Name::        Agency::  [employer]  Relationship::  facility  Contact Information:     Housing/Transportation Living arrangements for the past 2 months:  [place] of Information:  Patient, Facility Patient Interpreter Needed:  None Criminal Activity/Legal Involvement Pertinent to Current Situation/Hospitalization:  No - Comment as needed Significant Relationships:  Adult Children Lives with:  Facility Resident Do you feel safe going back to the place where you live?  Yes Need for family participation in patient care:  Yes (Comment)  Care giving concerns:  None reported. [name]    Social Worker assessment / plan:  CSW met with pt at bedside. Pt alert and oriented and reports he has been a resident at [facility] for a "long, long time." He considers [facility] his home now. He states that he has a daughter, but does not see her often as she is not well. Per Patrick [name] at facility, pt is resident on HA unit and okay to return. He uses a wheelchair at baseline. No FL2 needed, unless pt returns skilled.   Employment status:  Retired Forensic scientist:  Medicare PT Recommendations:  Not assessed at this time [place] / Referral to community resources:  Other (Comment Required) (Return to [facility])  Patient/Family's Response to care:  [name]   Patient/Family's Understanding of and Emotional Response to Diagnosis, Current Treatment, and Prognosis:  Pt is aware of admission diagnosis and treatment plan.   Emotional Assessment Appearance:  Appears stated  age Attitude/Demeanor/Rapport:  Other (Cooperative) Affect (typically observed):  Accepting Orientation:  Oriented to Self, Oriented to Place, Oriented to Situation Alcohol / Substance use:  Not Applicable Psych involvement (Current and /or in the community):  No (Comment)  Discharge Needs  Concerns to be addressed:  Discharge Planning Concerns Readmission within the last 30 days:  No Current discharge risk:  None Barriers to Discharge:  Continued Medical Work up   Patrick [name], [place] 04/14/2016, 9:43 AM [phone]

## 2016-04-14 NOTE — Progress Notes (Signed)
Changed foley per doctors order. Site dry with no erythema at meatus.

## 2016-04-14 NOTE — Progress Notes (Signed)
SLP Cancellation Note  Patient Details Name: Patrick [name] MRN: [id number] DOB: [birth year]   Cancelled treatment:       Reason Eval/Treat Not Completed: Other (comment); Consult received for BSE this date, however unable to complete due to scheduling conflict. SLP will attempt tomorrow afternoon.  Thank you,  Genene Churn, [place]    [place] 04/14/2016, 7:56 PM

## 2016-04-14 NOTE — Care Management Note (Signed)
Case Management Note  Patient Details  Name: Patrick [name] MRN: [id number] Date of Birth: 28-Nov-1913  Subjective/Objective:                  Pt admitted with HCAP from [facility] where he is a long term resident. Plan for return to [facility] at DC. CSW following and will make arrangements at DC.   Action/Plan: No CM needs.   Expected Discharge Date:     04/14/2016             Expected Discharge Plan:  [place]  In-House Referral:  Clinical Social Work  Discharge planning Services  CM Consult  Post Acute Care Choice:  NA Choice offered to:  NA  Status of Service:  Completed, signed off  Sherald Barge, RN 04/14/2016, 9:53 AM

## 2016-04-14 NOTE — Progress Notes (Signed)
Pharmacy Antibiotic Note  Patrick [name] is a 83 y.o. male admitted on 04/13/2016 with pneumonia.  Pharmacy has been consulted for vancomycin and cefepime dosing. Initial doses given last night in the ED  Plan: Cont vanc 750 mg IV q24 hours Cont cefepime 2 gm IV q24 hours F/u renal function, cultures and clinical course  Height: 5\' 11"  (180.3 cm) Weight: 161 lb 11.2 oz (73.3 kg) IBW/kg (Calculated) : 75.3  Temp (24hrs), Avg:98.4 F (36.9 C), Min:98.2 F (36.8 C), Max:98.5 F (36.9 C)   Recent Labs Lab 04/13/16 2220 04/13/16 2330 04/14/16 0544  WBC 15.7*  --  15.2*  CREATININE 1.24  --  1.08  LATICACIDVEN  --  0.83  --     Estimated Creatinine Clearance: 37.7 mL/min (by C-G formula based on SCr of 1.08 mg/dL).    Allergies  Allergen Reactions  . Celebrex [Celecoxib] Other (See Comments)    Unknown; patient can't remember  . Codeine Other (See Comments)    nausea    Antimicrobials this admission: vanc 2/28 >>  cefepime 2/28 >>   Microbiology results: 2/28 BCx:  3/1  MRSA PCR:   Thank you for allowing pharmacy to be a part of this patient's care.  Excell Seltzer [place] 04/14/2016 7:47 AM

## 2016-04-14 NOTE — Progress Notes (Signed)
PROGRESS NOTE                                                                                                                                                                                                             Patient Demographics:    Patrick [name], is a 83 y.o. male, DOB - 1913/10/26, [id number]  Admit date - 04/13/2016   Admitting Physician Orvan Falconer, MD  Outpatient Primary MD for the patient is [name] Patient  LOS - 1  Outpatient Specialists: GI Dr. Oneida Alar  Chief Complaint  Patient presents with  . Chest Pain       Brief Narrative    83 y.o. male SNF resident with DNR Code status, hx of anxiety, HTN, CHF, ischemic CMP, brought to the ER with respiratory problem.  He was found cyanotic, workup significant for pneumonia, and abnormal urinalysis.   Subjective:    Patrick [name] today Denies any complaints, asking for the temperature in the room to be lowered, and asking for breakfast(which he had just refused per nursing staff)   Assessment  & Plan :    Principal Problem:   Healthcare-associated pneumonia Active Problems:   CHF (congestive heart failure) ([place])   Osteoarthritis of left hip   HCAP (healthcare-associated pneumonia)   HCAP - Chest x-ray significant for left lower basal opacity, continue with IV vancomycin and cefepime for HCHP, will DC IV vancomycin and 24 hours if cultures remain negative. - Continue to follow blood cultures - History of dysphagia in the past, will consult SLP  Abnormal urinalysis - Patient with chronic indwelling Foley catheter, will change Foley, will obtain urine cultures  History of urinary retention - Continue with Foley catheter  Restless leg syndrome - Continue with Requip  GERD - Continue with PPI  Code Status : DNR  Family Communication  : None at bedside  Disposition Plan  : back to SNF when stable  Consults  :  None  Procedures  : None  DVT Prophylaxis  :   Heparin   Lab Results  Component Value Date   PLT 329 04/14/2016    Antibiotics  :    Anti-infectives    Start     Dose/Rate Route Frequency Ordered Stop   04/14/16 2330  ceFEPIme (MAXIPIME) 1 g in dextrose 5 % 50 mL IVPB  1 g 100 mL/hr over 30 Minutes Intravenous Every 24 hours 04/14/16 0108     04/14/16 2200  vancomycin (VANCOCIN) IVPB 750 mg/150 ml premix     750 mg 150 mL/hr over 60 Minutes Intravenous Every 24 hours 04/14/16 0741     04/14/16 2200  ceFEPIme (MAXIPIME) 2 g in dextrose 5 % 50 mL IVPB     2 g 100 mL/hr over 30 Minutes Intravenous Every 24 hours 04/14/16 0741     04/13/16 2315  ceFEPIme (MAXIPIME) 2 g in dextrose 5 % 50 mL IVPB     2 g 100 mL/hr over 30 Minutes Intravenous  Once 04/13/16 2314 04/13/16 2352   04/13/16 2315  vancomycin (VANCOCIN) IVPB 1000 mg/200 mL premix     1,000 mg 200 mL/hr over 60 Minutes Intravenous  Once 04/13/16 2314 04/14/16 0046        Objective:   Vitals:   04/13/16 2230 04/14/16 0000 04/14/16 0109 04/14/16 0114  BP: (!) 105/53 115/62  (!) 128/51  Pulse: 80 83  78  Resp: 19 21    Temp:    98.2 F (36.8 C)  TempSrc:    Oral  SpO2: 91% 95% 98% 98%  Weight:    73.3 kg (161 lb 11.2 oz)  Height:    5\' 11"  (1.803 m)    Wt Readings from Last 3 Encounters:  04/14/16 73.3 kg (161 lb 11.2 oz)  03/01/16 80.5 kg (177 lb 6.4 oz)  11/19/15 76.3 kg (168 lb 3.2 oz)     Intake/Output Summary (Last 24 hours) at 04/14/16 1021 Last data filed at 04/14/16 0900  Gross per 24 hour  Intake              250 ml  Output                0 ml  Net              250 ml     Physical Exam  Awake Alert,  confused Supple Neck,No JVD, .  Symmetrical Chest wall movement, Good air movement bilaterally, CTAB RRR,No Gallops,Rubs , No Parasternal Heave +ve B.Sounds, Abd Soft, has large incisional ventral abdominal hernia, nontender,, No rebound - guarding or rigidity. No Cyanosis, Clubbing or edema, No new Rash or bruise      Data Review:     CBC  Recent Labs Lab 04/13/16 2220 04/14/16 0544  WBC 15.7* 15.2*  HGB 10.3* 9.9*  HCT 31.4* 30.0*  PLT 358 329  MCV 82.4 82.6  MCH 27.0 27.3  MCHC 32.8 33.0  RDW 14.4 14.3  LYMPHSABS 1.4  --   MONOABS 1.8*  --   EOSABS 0.0  --   BASOSABS 0.0  --     Chemistries   Recent Labs Lab 04/13/16 2220 04/14/16 0544  NA 132* 133*  K 4.2 3.8  CL 102 99*  CO2 21* 24  GLUCOSE 131* 121*  BUN 17 16  CREATININE 1.24 1.08  CALCIUM 8.2* 8.3*  AST 23 17  ALT 19 16*  ALKPHOS 80 75  BILITOT 0.6 0.7   ------------------------------------------------------------------------------------------------------------------ No results for input(s): CHOL, HDL, LDLCALC, TRIG, CHOLHDL, LDLDIRECT in the last 72 hours.  Lab Results  Component Value Date   HGBA1C 6.1 (H) 11/12/2014   ------------------------------------------------------------------------------------------------------------------  Recent Labs  04/13/16 2220  TSH 1.323   ------------------------------------------------------------------------------------------------------------------ No results for input(s): VITAMINB12, FOLATE, FERRITIN, TIBC, IRON, RETICCTPCT in the last 72 hours.  Coagulation profile No results for input(s): INR,  PROTIME in the last 168 hours.  No results for input(s): DDIMER in the last 72 hours.  Cardiac Enzymes No results for input(s): CKMB, TROPONINI, MYOGLOBIN in the last 168 hours.  Invalid input(s): CK ------------------------------------------------------------------------------------------------------------------    Component Value Date/Time   BNP 245.0 (H) 04/12/2015 1535    Inpatient Medications  Scheduled Meds: . aspirin EC  81 mg Oral Daily  . ceFEPime (MAXIPIME) IV  1 g Intravenous Q24H  . ceFEPime (MAXIPIME) IV  2 g Intravenous Q24H  . docusate sodium  100 mg Oral Daily  . heparin  5,000 Units Subcutaneous Q8H  . pantoprazole  40 mg Oral Daily  . rOPINIRole  2 mg Oral  QHS  . sertraline  37.5 mg Oral Daily  . sodium chloride flush  3 mL Intravenous Q12H  . vancomycin  750 mg Intravenous Q24H   Continuous Infusions: PRN Meds:.acetaminophen **OR** acetaminophen  Micro Results Recent Results (from the past 240 hour(s))  Culture, blood (routine x 2)     Status: None (Preliminary result)   Collection Time: 04/13/16 11:14 PM  Result Value Ref Range Status   Specimen Description RIGHT ANTECUBITAL  Final   Special Requests BOTTLES DRAWN AEROBIC AND ANAEROBIC [place]  Final   Culture NO GROWTH < 12 HOURS  Final   Report Status PENDING  Incomplete  Culture, blood (routine x 2)     Status: None (Preliminary result)   Collection Time: 04/13/16 11:21 PM  Result Value Ref Range Status   Specimen Description LEFT ANTECUBITAL  Final   Special Requests BOTTLES DRAWN AEROBIC AND ANAEROBIC [place]  Final   Culture NO GROWTH < 12 HOURS  Final   Report Status PENDING  Incomplete  MRSA PCR Screening     Status: Abnormal   Collection Time: 04/14/16  1:07 AM  Result Value Ref Range Status   MRSA by PCR POSITIVE (A) NEGATIVE Final    Comment:        The GeneXpert MRSA Assay (FDA approved for NASAL specimens only), is one component of a comprehensive MRSA colonization surveillance program. It is not intended to diagnose MRSA infection nor to guide or monitor treatment for MRSA infections. RESULT CALLED TO, READ BACK BY AND VERIFIED WITH: HYLTON L. AT [id number] ON [id number] BY Biagio Borg.     Radiology Reports Dg Chest Port 1 View  Result Date: 04/13/2016 CLINICAL DATA:  Left-sided chest pain and cough for 2 days. EXAM: PORTABLE CHEST 1 VIEW COMPARISON:  Chest radiograph 04/28/2015 FINDINGS: Patchy and confluent left lung base opacities suspicious for pneumonia. Streaky right infrahilar opacities likely atelectasis. Mild cardiomegaly with unchanged aortic tortuosity and atherosclerosis. There is mild vascular congestion without overt pulmonary edema. Possible small left  pleural effusion. No pneumothorax. Stable osseous structures. IMPRESSION: Patchy and confluent left lung base opacity suspicious for pneumonia. Right infrahilar atelectasis. Electronically Signed   By: Jeb Levering M.D.   On: 04/13/2016 22:47     Tyrek Lawhorn M.D on 04/14/2016 at 10:21 AM  Between 7am to 7pm - Pager - [phone]  After 7pm go to [email or web address] - password [facility]  [facility] -  Office  [phone]

## 2016-04-15 ENCOUNTER — Inpatient Hospital Stay (HOSPITAL_COMMUNITY): Payer: Medicare Other

## 2016-04-15 DIAGNOSIS — L8990 Pressure ulcer of unspecified site, unspecified stage: Secondary | ICD-10-CM | POA: Insufficient documentation

## 2016-04-15 LAB — BASIC METABOLIC PANEL
Anion gap: 9 (ref 5–15)
BUN: 14 mg/dL (ref 6–20)
CHLORIDE: 101 mmol/L (ref 101–111)
CO2: 23 mmol/L (ref 22–32)
CREATININE: 0.93 mg/dL (ref 0.61–1.24)
Calcium: 8.4 mg/dL — ABNORMAL LOW (ref 8.9–10.3)
GFR calc non Af Amer: >60 mL/min (ref >60)
Glucose, Bld: 113 mg/dL — ABNORMAL HIGH (ref 65–99)
Potassium: 3.5 mmol/L (ref 3.5–5.1)
Sodium: 133 mmol/L — ABNORMAL LOW (ref 135–145)

## 2016-04-15 LAB — CBC
HEMATOCRIT: 29.8 % — AB (ref 39.0–52.0)
HEMOGLOBIN: 9.9 g/dL — AB (ref 13.0–17.0)
MCH: 27.3 pg (ref 26.0–34.0)
MCHC: 33.2 g/dL (ref 30.0–36.0)
MCV: 82.1 fL (ref 78.0–100.0)
Platelets: 341 10*3/uL (ref 150–400)
RBC: 3.63 MIL/uL — ABNORMAL LOW (ref 4.22–5.81)
RDW: 14.3 % (ref 11.5–15.5)
WBC: 12.5 10*3/uL — ABNORMAL HIGH (ref 4.0–10.5)

## 2016-04-15 LAB — GLUCOSE, CAPILLARY: GLUCOSE-CAPILLARY: 101 mg/dL — AB (ref 65–99)

## 2016-04-15 MED ORDER — SACCHAROMYCES BOULARDII 250 MG PO CAPS
250.0000 mg | ORAL_CAPSULE | Freq: Two times a day (BID) | ORAL | Status: DC
  Administered 2016-04-15 – 2016-04-17 (×4): 250 mg via ORAL
  Filled 2016-04-15 (×5): qty 1

## 2016-04-15 MED ORDER — POTASSIUM CHLORIDE IN NACL 20-0.9 MEQ/L-% IV SOLN
INTRAVENOUS | Status: DC
  Administered 2016-04-15: 14:00:00 via INTRAVENOUS

## 2016-04-15 MED ORDER — PIPERACILLIN-TAZOBACTAM 3.375 G IVPB
3.3750 g | Freq: Three times a day (TID) | INTRAVENOUS | Status: DC
  Administered 2016-04-15 – 2016-04-17 (×7): 3.375 g via INTRAVENOUS
  Filled 2016-04-15 (×7): qty 50

## 2016-04-15 MED ORDER — ORAL CARE MOUTH RINSE
15.0000 mL | Freq: Two times a day (BID) | OROMUCOSAL | Status: DC
  Administered 2016-04-15 – 2016-04-17 (×4): 15 mL via OROMUCOSAL

## 2016-04-15 NOTE — Clinical Social Work Note (Signed)
CSW updated PNC on pt. Aware of PT evaluation and pt is requiring max assist with transfers. At baseline, he requires limited assist. Pt will return skilled. PNC aware of possible weekend return. Will complete FL2.   Benay Pike, [place]

## 2016-04-15 NOTE — Progress Notes (Signed)
Results of CXR=Worsening left basilar aeration with increasing opacity and possible pleural effusion. Unchanged bibasilar atelectasis. Dr. Marin Comment paged and made aware.

## 2016-04-15 NOTE — Care Management Important Message (Signed)
Important Message  Patient Details  Name: Patrick [name] MRN: [id number] Date of Birth: Jul 12, 1913   Medicare Important Message Given:  Yes    Sherald Barge, RN 04/15/2016, 9:39 AM

## 2016-04-15 NOTE — Evaluation (Signed)
Physical Therapy Evaluation Patient Details Name: Patrick [name] MRN: [id number] DOB: December 21, 1913 Today's Date: 04/15/2016   History of Present Illness  83 y.o.maleSNF resident with DNR Code status, hx of anxiety, HTN, CHF, ischemic CMP, brought to the ER with respiratory problem. He was found cyanotic, workup significant for pneumonia, and abnormal urinalysis.    Clinical Impression  Pt received in bed, and is agreeable to PT evaluation.  Pt states that he is normally able to transfer bed<>w/c independently, however facility has expressed to SW that he requires "limited" assist for transfers.  During PT evaluation, he required Max A for squat pivot transfer bed<>chair.  Recommend that he return to [facility] level due to need for increased level of assistance for all functional mobility tasks.      Follow Up Recommendations SNF    Equipment Recommendations  None recommended by PT    Recommendations for Other Services       Precautions / Restrictions Precautions Precautions: Fall Precaution Comments: Due to immobility Restrictions Weight Bearing Restrictions: No      Mobility  Bed Mobility Overal bed mobility: Needs Assistance Bed Mobility: Supine to Sit     Supine to sit: Max assist;HOB elevated        Transfers Overall transfer level: Needs assistance Equipment used: None Transfers: Squat Pivot Transfers     Squat pivot transfers: Max assist        Ambulation/Gait Ambulation/Gait assistance:  (NA - pt is not ambulatory at baseline)              Science writer    Modified Rankin (Stroke Patients Only)       Balance Overall balance assessment: Needs assistance Sitting-balance support: Bilateral upper extremity supported;Feet supported Sitting balance-Leahy Scale: Fair       Standing balance-Leahy Scale: Poor                               Pertinent Vitals/Pain Pain Assessment: No/denies  pain    Home Living     Available Help at Discharge: [place] ([facility])           Home Equipment: Wheelchair - manual      Prior Function Level of Independence: Needs assistance   Gait / Transfers Assistance Needed: Pt requires "limited" assistance for transfers bed<>w/c  ADL's / Homemaking Assistance Needed: Pt requires assistance for dressing and bathing.          Hand Dominance        Extremity/Trunk Assessment   Upper Extremity Assessment Upper Extremity Assessment: Generalized weakness    Lower Extremity Assessment Lower Extremity Assessment: Generalized weakness (B knee flexion contractures at ~90*)    Cervical / Trunk Assessment Cervical / Trunk Assessment: Kyphotic  Communication      Cognition Arousal/Alertness: Lethargic Behavior During Therapy: Flat affect Overall Cognitive Status: Difficult to assess                 General Comments: Pt keeps eyes closed most of the evaluation - however he will open when requested to do so.  Noted mucous around eyelids.    General Comments      Exercises     Assessment/Plan    PT Assessment Patient needs continued PT services  PT Problem List Decreased strength;Decreased range of motion;Decreased activity tolerance;Decreased balance;Decreased mobility  PT Treatment Interventions DME instruction;Functional mobility training;Therapeutic activities;Therapeutic exercise;Balance training;Patient/family education;Wheelchair mobility training    PT Goals (Current goals can be found in the Care Plan section)  Acute Rehab PT Goals Patient Stated Goal: Pt wants to go back to the [facility] PT Goal Formulation: With patient Time For Goal Achievement: 04/22/16 Potential to Achieve Goals: Fair    Frequency Min 3X/week   Barriers to discharge        Co-evaluation               End of Session Equipment Utilized During Treatment: Gait belt Activity Tolerance:  Patient limited by fatigue Patient left: in chair;with call bell/phone within reach Nurse Communication: Mobility status;Need for lift equipment Margreta Journey, RN notified of pt's mobiltiy status and need for maxi move for transfer back to bed. ) PT Visit Diagnosis: Muscle weakness (generalized) (M62.81);Other abnormalities of gait and mobility (R26.89)    Functional Assessment Tool Used: AM-PAC 6 Clicks Basic Mobility;Clinical judgement Functional Limitation: Mobility: Walking and moving around Mobility: Walking and Moving Around Current Status [id number]): At least 60 percent but less than 80 percent impaired, limited or restricted Mobility: Walking and Moving Around Goal Status [phone]): At least 40 percent but less than 60 percent impaired, limited or restricted    Time: 0922-1000 PT Time Calculation (min) (ACUTE ONLY): 38 min   Charges:   PT Evaluation $PT Eval Low Complexity: 1 Procedure PT Treatments $Therapeutic Activity: 23-37 mins   PT G Codes:   PT G-Codes **NOT FOR INPATIENT CLASS** Functional Assessment Tool Used: AM-PAC 6 Clicks Basic Mobility;Clinical judgement Functional Limitation: Mobility: Walking and moving around Mobility: Walking and Moving Around Current Status [id number]): At least 60 percent but less than 80 percent impaired, limited or restricted Mobility: Walking and Moving Around Goal Status [phone]): At least 40 percent but less than 60 percent impaired, limited or restricted    Beth Braxton Weisbecker, PT, DPT X: [phone]

## 2016-04-15 NOTE — Progress Notes (Signed)
RT attempted incentive spirometry with patient several times;however, patien unable to close mouth and get a good seal to take in a deep enough breath. Patient unable to do.

## 2016-04-15 NOTE — Progress Notes (Signed)
On call Md, Dr. Hal Hope paged and made aware of pt coughing up copious amounts of sputum which is new from last night with pt. RN wondering if pt is aspiration food. Speech is to see pt tomorrow afternoon. New order for CXR and to page MD if abnormal. Will continue to monitor pt

## 2016-04-15 NOTE — Evaluation (Signed)
Clinical/Bedside Swallow Evaluation Patient Details  Name: Patrick [name] MRN: [id number] Date of Birth: 1913/06/05  Today's Date: 04/15/2016 Time: SLP Start Time (ACUTE ONLY): 68 SLP Stop Time (ACUTE ONLY): 1545 SLP Time Calculation (min) (ACUTE ONLY): 15 min  Past Medical History:  Past Medical History:  Diagnosis Date  . Anxiety   . Bilateral foot pain   . CHF (congestive heart failure) ([place])   . Diverticulitis   . DJD (degenerative joint disease), cervical   . DNR (do not resuscitate)   . Esophageal dysmotility    age related per BPE 04/2015  . Esophageal stricture   . GERD (gastroesophageal reflux disease)   . Gout   . Hiatal hernia   . History of recurrent TIAs   . HTN (hypertension)   . Hyperlipidemia   . Inguinal hernia   . Ischemic heart disease   . Kidney stone   . Osteoarthritis    bilat knees  . Pleural effusion   . Reflux   . Restless leg syndrome   . Right hip pain   . Skin cancer, basal cell   . Urinary retention    Past Surgical History:  Past Surgical History:  Procedure Laterality Date  . ESOPHAGEAL DILATION    . EXPLORATORY LAPAROTOMY W/ BOWEL RESECTION    . FRACTURE SURGERY    . HEMIARTHROPLASTY HIP     Dr. Aline Brochure  . intestines     HPI:  Pt is a 83 y.o. male SNF resident with PMH of anxiety, GERD, HTN, CHF, ischemic CMP, brought to the ER with respiratory problem. He was found cyanotic. In the ER, he felt better with supplemental [place] oxygen. Work up included a CXR with PNA, and leukocytosis with WBC of 15K, lactic acid was normal, and Cr was 1.2. He was started on IV Cefepime and IV Vancomycin. BC were obtained. EDP spoke with family and also gotten information that he hadn't been eating well, and decision was made to admit him into the hospital for HCAP. CXR 3/2 showed Worsening left basilar aeration with increasing opacity and possible pleural effusion. Unchanged bibasilar atelectasis. Esophagram from 05/01/15 showed laryngeal penetration  and aspiration of contrast, diffuse severe esophageal dysmotility, "question age-related". Note that SLP was following during visit in 2017 with dysphagia 3/ thin liquid diet recommended; there was a request for a MBSS during that visit but it was not able to be completed, no outpatient MBSS documented. Bedside swallow evaluation ordered.   Assessment / Plan / Recommendation Clinical Impression  Pt did not demonstrate any immediate s/s of aspiration; however, he did have a wet vocal quality following trials of thin liquid, concerning for possible silent aspiration. No overt difficulty noted with puree trials. Pt had esophagram in 2017 which showed penetration/ aspiration that was suspected to be from residuals in the pyriform sinuses; also severe esophageal dismotility. Given these findings along with current PNA and decreased respiratory status, pt is at high risk of aspiration. Plan to proceed with MBS to objectively evaluate swallow function. Plan for MBS later today; until then, recommend that pt remain NPO with meds crushed in puree.  SLP Visit Diagnosis: Dysphagia, unspecified (R13.10)    Aspiration Risk  Severe aspiration risk    Diet Recommendation NPO   Medication Administration: Crushed with puree    Other  Recommendations Oral Care Recommendations: Oral care QID Other Recommendations: Have oral suction available   Follow up Recommendations Skilled Nursing facility      Frequency and Duration  (TBD)  Prognosis        Swallow Study   General HPI: Pt is a 83 y.o. male SNF resident with PMH of anxiety, GERD, HTN, CHF, ischemic CMP, brought to the ER with respiratory problem. He was found cyanotic. In the ER, he felt better with supplemental [place] oxygen. Work up included a CXR with PNA, and leukocytosis with WBC of 15K, lactic acid was normal, and Cr was 1.2. He was started on IV Cefepime and IV Vancomycin. BC were obtained. EDP spoke with family and also gotten  information that he hadn't been eating well, and decision was made to admit him into the hospital for HCAP. CXR 3/2 showed Worsening left basilar aeration with increasing opacity and possible pleural effusion. Unchanged bibasilar atelectasis. Esophagram from 05/01/15 showed laryngeal penetration and aspiration of contrast, diffuse severe esophageal dysmotility, "question age-related". Note that SLP was following during visit in 2017 with dysphagia 3/ thin liquid diet recommended; there was a request for a MBSS during that visit but it was not able to be completed, no outpatient MBSS documented. Bedside swallow evaluation ordered. Type of Study: Bedside Swallow Evaluation Previous Swallow Assessment: BSE 2017- D3/ thin rec'd Diet Prior to this Study: NPO Temperature Spikes Noted: No Respiratory Status: Nasal cannula History of Recent Intubation: No Behavior/Cognition: Alert;Cooperative;Pleasant mood Oral Cavity Assessment: Within Functional Limits Oral Care Completed by SLP: Yes Oral Cavity - Dentition: Dentures, top Vision: Impaired for self-feeding Self-Feeding Abilities: Able to feed self;Needs assist Patient Positioning: Upright in chair Baseline Vocal Quality: Normal Volitional Cough: Strong Volitional Swallow: Able to elicit    Oral/Motor/Sensory Function Overall Oral Motor/Sensory Function: Within functional limits   Ice Chips Ice chips: Not tested   Thin Liquid Thin Liquid: Impaired Presentation: Cup;Straw Pharyngeal  Phase Impairments: Wet Vocal Quality    Nectar Thick Nectar Thick Liquid: Not tested   Honey Thick Honey Thick Liquid: Not tested   Puree Puree: Within functional limits Presentation: Self Fed;Spoon   Solid   GO   Solid: Not tested (wanted adhesive for dentures- adhesive unavailable)        Oleksiak, Amy K, MA, CCC-SLP 04/15/2016,3:55 PM

## 2016-04-15 NOTE — Progress Notes (Signed)
Pharmacy Antibiotic Note  Patrick [name] is a 83 y.o. male admitted on 04/13/2016 with aspiration pneumonia.  Pharmacy has been consulted for Zosyn And Vancomycin dosing. Patient therapy changed from cefepime to zosyn. Afebrile, WBC improving.   Plan: Cont vancomycin 750 mg IV q24 hours Zosyn 3.375gm IV q8h, EID over 4 hours F/u renal function, cultures and clinical course  Height: 5\' 11"  (180.3 cm) Weight: 161 lb 11.2 oz (73.3 kg) IBW/kg (Calculated) : 75.3  Temp (24hrs), Avg:98 F (36.7 C), Min:97.5 F (36.4 C), Max:98.6 F (37 C)   Recent Labs Lab 04/13/16 2220 04/13/16 2330 04/14/16 0544 04/15/16 0422  WBC 15.7*  --  15.2* 12.5*  CREATININE 1.24  --  1.08 0.93  LATICACIDVEN  --  0.83  --   --     Estimated Creatinine Clearance: 43.8 mL/min (by C-G formula based on SCr of 0.93 mg/dL).    Allergies  Allergen Reactions  . Celebrex [Celecoxib] Other (See Comments)    Unknown; patient can't remember  . Codeine Other (See Comments)    nausea    Antimicrobials this admission: vancomycin 2/28 >>  cefepime 2/28 >> 3/2 Zosyn 3/2 >>  Microbiology results: 2/28 BCx: ngtd 3/1  MRSA PCR: positive 3/1 Urine cx: pending  Thank you for allowing pharmacy to be a part of this patient's care.  Isac Sarna, BS Pharm D, [place] Clinical Pharmacist Pager [phone] 04/15/2016 8:35 AM

## 2016-04-15 NOTE — Progress Notes (Signed)
Dentures at sink soaking

## 2016-04-15 NOTE — Progress Notes (Addendum)
PROGRESS NOTE                                                                                                                                                                                                             Patient Demographics:    Patrick [name], is a 83 y.o. male, DOB - 03-Nov-1913, [id number]  Admit date - 04/13/2016   Admitting Physician Orvan Falconer, MD  Outpatient Primary MD for the patient is [name] Patient  LOS - 2  Outpatient Specialists: GI Dr. Oneida Alar  Chief Complaint  Patient presents with  . Chest Pain       Brief Narrative    83 y.o. male SNF resident with DNR Code status, hx of anxiety, HTN, CHF, ischemic CMP, brought to the ER with respiratory problem.  He was found cyanotic, workup significant for pneumonia, and abnormal urinalysis.   Subjective:    Patrick [name] today Denies any complaints, A porcine of the pulmonic sleep, denies any chest pain, shortness of breath, fever or chills, patient noticed to have cough whenever he is fed, made nothing by mouth overnight.   Assessment  & Plan :    Principal Problem:   Healthcare-associated pneumonia Active Problems:   CHF (congestive heart failure) (HCC)   Osteoarthritis of left hip   HCAP (healthcare-associated pneumonia)   Pressure injury of skin   HCAP - Chest x-ray significant for left lower basal opacity, Likely on IV vancomycin and cefepime for HCHP, will discontinue IV vancomycin given negative blood cultures , we'll change cefepime to Zosyn to cover for aspiration pending SLP evaluation. - Continue to follow blood cultures - History of dysphagia in the past, will consult SLP - We'll start some fluids while he is nothing by mouth  Abnormal urinalysis - Patient with chronic indwelling Foley catheter, change Foley catheter 3/1, follow urine cultures  History of urinary retention - Continue with Foley catheter  Restless leg syndrome - Continue with  Requip  GERD - Continue with PPI  Code Status : DNR  Family Communication  : None at bedside  Disposition Plan  : back to SNF when stable  Consults  :  None  Procedures  : None  DVT Prophylaxis  :  Heparin   Lab Results  Component Value Date   PLT 341 04/15/2016    Antibiotics  :  Anti-infectives    Start     Dose/Rate Route Frequency Ordered Stop   04/15/16 0800  piperacillin-tazobactam (ZOSYN) IVPB 3.375 g     3.375 g 12.5 mL/hr over 240 Minutes Intravenous Every 8 hours 04/15/16 0736     04/14/16 2330  ceFEPIme (MAXIPIME) 1 g in dextrose 5 % 50 mL IVPB  Status:  Discontinued     1 g 100 mL/hr over 30 Minutes Intravenous Every 24 hours 04/14/16 0108 04/15/16 0730   04/14/16 2200  vancomycin (VANCOCIN) IVPB 750 mg/150 ml premix     750 mg 150 mL/hr over 60 Minutes Intravenous Every 24 hours 04/14/16 0741     04/14/16 2200  ceFEPIme (MAXIPIME) 2 g in dextrose 5 % 50 mL IVPB  Status:  Discontinued     2 g 100 mL/hr over 30 Minutes Intravenous Every 24 hours 04/14/16 0741 04/15/16 0736   04/13/16 2315  ceFEPIme (MAXIPIME) 2 g in dextrose 5 % 50 mL IVPB     2 g 100 mL/hr over 30 Minutes Intravenous  Once 04/13/16 2314 04/13/16 2352   04/13/16 2315  vancomycin (VANCOCIN) IVPB 1000 mg/200 mL premix     1,000 mg 200 mL/hr over 60 Minutes Intravenous  Once 04/13/16 2314 04/14/16 0046        Objective:   Vitals:   04/14/16 0114 04/14/16 1348 04/14/16 2108 04/15/16 0634  BP: (!) 128/51 (!) 128/44 121/64 (!) 156/76  Pulse: 78 86 76 80  Resp:  18 18 18   Temp: 98.2 F (36.8 C) 98.6 F (37 C) 97.8 F (36.6 C) 97.5 F (36.4 C)  TempSrc: Oral  Oral Axillary  SpO2: 98% 90% 97% 95%  Weight: 73.3 kg (161 lb 11.2 oz)     Height: 5\' 11"  (1.803 m)       Wt Readings from Last 3 Encounters:  04/14/16 73.3 kg (161 lb 11.2 oz)  03/01/16 80.5 kg (177 lb 6.4 oz)  11/19/15 76.3 kg (168 lb 3.2 oz)     Intake/Output Summary (Last 24 hours) at 04/15/16 1327 Last data filed  at 04/15/16 [id number]  Gross per 24 hour  Intake              450 ml  Output              600 ml  Net             -150 ml     Physical Exam  Awake Alert,  confused Supple Neck,No JVD, .  Symmetrical Chest wall movement, Good air movement bilaterally, CTAB RRR,No Gallops,Rubs , No Parasternal Heave +ve B.Sounds, Abd Soft, has large incisional ventral abdominal hernia, nontender,, No rebound - guarding or rigidity. No Cyanosis, Clubbing or edema, No new Rash or bruise      Data Review:    CBC  Recent Labs Lab 04/13/16 2220 04/14/16 0544 04/15/16 0422  WBC 15.7* 15.2* 12.5*  HGB 10.3* 9.9* 9.9*  HCT 31.4* 30.0* 29.8*  PLT 358 329 341  MCV 82.4 82.6 82.1  MCH 27.0 27.3 27.3  MCHC 32.8 33.0 33.2  RDW 14.4 14.3 14.3  LYMPHSABS 1.4  --   --   MONOABS 1.8*  --   --   EOSABS 0.0  --   --   BASOSABS 0.0  --   --     Chemistries   Recent Labs Lab 04/13/16 2220 04/14/16 0544 04/15/16 0422  NA 132* 133* 133*  K 4.2 3.8 3.5  CL 102 99* 101  CO2 21* 24 23  GLUCOSE 131* 121* 113*  BUN 17 16 14   CREATININE 1.24 1.08 0.93  CALCIUM 8.2* 8.3* 8.4*  AST 23 17  --   ALT 19 16*  --   ALKPHOS 80 75  --   BILITOT 0.6 0.7  --    ------------------------------------------------------------------------------------------------------------------ No results for input(s): CHOL, HDL, LDLCALC, TRIG, CHOLHDL, LDLDIRECT in the last 72 hours.  Lab Results  Component Value Date   HGBA1C 6.1 (H) 11/12/2014   ------------------------------------------------------------------------------------------------------------------  Recent Labs  04/13/16 2220  TSH 1.323   ------------------------------------------------------------------------------------------------------------------ No results for input(s): VITAMINB12, FOLATE, FERRITIN, TIBC, IRON, RETICCTPCT in the last 72 hours.  Coagulation profile No results for input(s): INR, PROTIME in the last 168 hours.  No results for input(s):  DDIMER in the last 72 hours.  Cardiac Enzymes No results for input(s): CKMB, TROPONINI, MYOGLOBIN in the last 168 hours.  Invalid input(s): CK ------------------------------------------------------------------------------------------------------------------    Component Value Date/Time   BNP 245.0 (H) 04/12/2015 1535    Inpatient Medications  Scheduled Meds: . aspirin EC  81 mg Oral Daily  . docusate sodium  100 mg Oral Daily  . heparin  5,000 Units Subcutaneous Q8H  . pantoprazole  40 mg Oral Daily  . piperacillin-tazobactam (ZOSYN)  IV  3.375 g Intravenous Q8H  . rOPINIRole  2 mg Oral QHS  . saccharomyces boulardii  250 mg Oral BID  . sertraline  37.5 mg Oral Daily  . sodium chloride flush  3 mL Intravenous Q12H  . vancomycin  750 mg Intravenous Q24H   Continuous Infusions: PRN Meds:.acetaminophen **OR** acetaminophen  Micro Results Recent Results (from the past 240 hour(s))  Culture, blood (routine x 2)     Status: None (Preliminary result)   Collection Time: 04/13/16 11:14 PM  Result Value Ref Range Status   Specimen Description RIGHT ANTECUBITAL  Final   Special Requests BOTTLES DRAWN AEROBIC AND ANAEROBIC [place]  Final   Culture NO GROWTH 2 DAYS  Final   Report Status PENDING  Incomplete  Culture, blood (routine x 2)     Status: None (Preliminary result)   Collection Time: 04/13/16 11:21 PM  Result Value Ref Range Status   Specimen Description LEFT ANTECUBITAL  Final   Special Requests BOTTLES DRAWN AEROBIC AND ANAEROBIC [place]  Final   Culture NO GROWTH 2 DAYS  Final   Report Status PENDING  Incomplete  MRSA PCR Screening     Status: Abnormal   Collection Time: 04/14/16  1:07 AM  Result Value Ref Range Status   MRSA by PCR POSITIVE (A) NEGATIVE Final    Comment:        The GeneXpert MRSA Assay (FDA approved for NASAL specimens only), is one component of a comprehensive MRSA colonization surveillance program. It is not intended to diagnose MRSA infection nor to  guide or monitor treatment for MRSA infections. RESULT CALLED TO, READ BACK BY AND VERIFIED WITH: HYLTON L. AT [id number] ON [id number] BY Biagio Borg.     Radiology Reports Dg Chest Port 1 View  Result Date: 04/15/2016 CLINICAL DATA:  Aspiration into airway.  Cough. EXAM: PORTABLE CHEST 1 VIEW COMPARISON:  Radiographs 04/13/2016 FINDINGS: Increasing left basilar opacity with possible developing pleural effusion. Unchanged right basilar atelectasis. Unchanged heart size and mediastinal contours with thoracic aortic atherosclerosis. No evidence of pulmonary edema. No pneumothorax. IMPRESSION: Worsening left basilar aeration with increasing opacity and possible pleural effusion. Unchanged bibasilar atelectasis. Electronically Signed   By: Fonnie Birkenhead.D.  On: 04/15/2016 01:07   Dg Chest Port 1 View  Result Date: 04/13/2016 CLINICAL DATA:  Left-sided chest pain and cough for 2 days. EXAM: PORTABLE CHEST 1 VIEW COMPARISON:  Chest radiograph 04/28/2015 FINDINGS: Patchy and confluent left lung base opacities suspicious for pneumonia. Streaky right infrahilar opacities likely atelectasis. Mild cardiomegaly with unchanged aortic tortuosity and atherosclerosis. There is mild vascular congestion without overt pulmonary edema. Possible small left pleural effusion. No pneumothorax. Stable osseous structures. IMPRESSION: Patchy and confluent left lung base opacity suspicious for pneumonia. Right infrahilar atelectasis. Electronically Signed   By: Jeb Levering M.D.   On: 04/13/2016 22:47     Patrick [name] M.D on 04/15/2016 at 1:27 PM  Between 7am to 7pm - Pager - [phone]  After 7pm go to [email or web address] - password [facility]  [facility] -  Office  [phone]

## 2016-04-15 NOTE — Progress Notes (Addendum)
Modified Barium Swallow Progress Note  Patient Details  Name: Patrick [name] MRN: [id number] Date of Birth: 11-01-1913  Today's Date: 04/15/2016  Modified Barium Swallow completed.  Brief recommendations include the following:  Clinical Impression  Pt currently with a mild oral, moderate pharyngeal dysphagia. Pt with premature spillage of thin liquids to the level of the vallecula and pyriform sinuses (inconsistent timing). Pt had silent aspiration x1 of thin liquids when consuming consecutive, large sips of thin liquid. Pt with effective cough when cued. Reduced base of tongue retraction resulted in mild-moderate residuals in the vallecula which the pt did not sense, most of which was effectively cleared with cued 2nd swallow. Chin tuck attempted with little change in residuals. Pt with known esophageal dysmotility; however, material appeared to clear through the UES and pt was not observed to have refluxed material back to the level of the UES. Pt did have prolonged mastication with solid- not able to wear dentures due to lack of adhesive. Given the occurrence of aspiration and esophageal issues noted on previous esophagram, pt continues to be at an increased risk of aspiration. Risk is reduced by utilizing the following strategies: small sips, 1 sip at a time, swallow 2 times with liquids. Recommend initiating dysphagia 3 diet, thin liquids, meds crushed in puree, full supervision to cue for strategies, have pt sit upright 30-60 minutes after meal. Reviewed recommendations and strategies with pt who is in agreement with plan. Will continue to follow for review of compensatory strategies. Pt is frustrated with lack of denture adhesive; please get some for him if possible.   Swallow Evaluation Recommendations   Recommended Consults: Consider esophageal assessment   SLP Diet Recommendations: Dysphagia 3 (Mech soft) solids;Thin liquid   Liquid Administration via: Cup;Straw   Medication  Administration: Crushed with puree   Supervision: Patient able to self feed;Full supervision/cueing for compensatory strategies   Compensations: Slow rate;Small sips/bites;Multiple dry swallows after each bite/sip;Other (Comment) (1 sip at a time)   Postural Changes: Remain semi-upright after after feeds/meals (Comment);Seated upright at 90 degrees   Oral Care Recommendations: Oral care BID   Other Recommendations: Clarify dietary restrictions    Kern Reap, MA, CCC-SLP 04/15/2016,5:23 PM

## 2016-04-15 NOTE — NC FL2 (Signed)
[place] LEVEL OF CARE SCREENING TOOL     IDENTIFICATION  Patient Name: Patrick [name] Birthdate: 08/19/1913 Sex: male Admission Date (Current Location): 04/13/2016  [facility] and [place] Number:  [employer] and Address:  [place] [address] [place]      Provider Number: [id number]  Attending Physician Name and Address:  Albertine Patricia, MD  Relative Name and Phone Number:       Current Level of Care: Hospital Recommended Level of Care: [place] Prior Approval Number:    Date Approved/Denied:   PASRR Number: [id number]  Discharge Plan: SNF    Current Diagnoses: Patient Active Problem List   Diagnosis Date Noted  . Pressure injury of skin 04/15/2016  . HCAP (healthcare-associated pneumonia) 04/14/2016  . Anemia 08/20/2015  . Rash and nonspecific skin eruption 08/19/2015  . Osteoarthritis of left hip 08/03/2015  . Ear lesion 07/07/2015  . Pressure ulcer 04/29/2015  . Dysphagia 04/28/2015  . Nausea 04/28/2015  . History of gout 04/08/2015  . AKI (acute kidney injury) ([place]) 12/06/2014  . Sepsis ([place]) 12/05/2014  . Healthcare-associated pneumonia 12/05/2014  . Hypotension 10/07/2014  . Edema 07/21/2014  . Cellulitis 07/21/2014  . Hypotension, unspecified 09/01/2013  . CHF (congestive heart failure) ([place]) 08/22/2013  . UTI (urinary tract infection) 08/22/2013  . Unspecified constipation 08/22/2013  . GERD (gastroesophageal reflux disease) 08/20/2013  . Tachycardia 08/20/2013  . BPH (benign prostatic hyperplasia) 03/10/2013  . Restless legs 03/10/2013  . Depression 03/10/2013  . Insomnia 03/07/2013  . Arthritis of knee, degenerative 12/22/2010    Orientation RESPIRATION BLADDER Height & Weight     Self, Situation, Place  O2 (2 L) Indwelling catheter Weight: 161 lb 11.2 oz (73.3 kg) Height:  5\' 11"  (180.3 cm)  BEHAVIORAL SYMPTOMS/MOOD NEUROLOGICAL BOWEL NUTRITION STATUS  Other  (Comment) (none)  (n/a) Incontinent Diet (NPO pending speech eval. See d/c summary for updates)  AMBULATORY STATUS COMMUNICATION OF NEEDS Skin   Extensive Assist Verbally Skin abrasions, Bruising, Other (Comment) (Stage I to sacrum with foam dressing. Unstageable to 2nd and 3rd toes. )                       Personal Care Assistance Level of Assistance  Bathing, Feeding, Dressing Bathing Assistance: Maximum assistance Feeding assistance: Limited assistance Dressing Assistance: Maximum assistance     Functional Limitations Info    Sight Info: Impaired Hearing Info: Impaired Speech Info: Adequate    SPECIAL CARE FACTORS FREQUENCY  PT (By licensed PT)     PT Frequency: 5              Contractures      Additional Factors Info  Code Status, Allergies, Isolation Precautions Code Status Info: DNR Allergies Info: Celebrex (Celecoxib), Codeine     Isolation Precautions Info: MRSA by pcr + 04/14/2016     Current Medications (04/15/2016):  This is the current hospital active medication list Current Facility-Administered Medications  Medication Dose Route Frequency Provider Last Rate Last Dose  . acetaminophen (TYLENOL) tablet 650 mg  650 mg Oral Q6H PRN Orvan Falconer, MD   650 mg at 04/14/16 2337   Or  . acetaminophen (TYLENOL) suppository 650 mg  650 mg Rectal Q6H PRN Orvan Falconer, MD      . aspirin EC tablet 81 mg  81 mg Oral Daily Orvan Falconer, MD      . docusate sodium (COLACE) capsule 100 mg  100 mg Oral Daily Orvan Falconer, MD      . heparin injection 5,000 Units  5,000 Units Subcutaneous Q8H Orvan Falconer, MD   5,000 Units at 04/15/16 [phone]  . pantoprazole (PROTONIX) EC tablet 40 mg  40 mg Oral Daily Orvan Falconer, MD      . piperacillin-tazobactam (ZOSYN) IVPB 3.375 g  3.375 g Intravenous Q8H Albertine Patricia, MD   3.375 g at 04/15/16 0856  . rOPINIRole (REQUIP) tablet 2 mg  2 mg Oral QHS Orvan Falconer, MD   2 mg at 04/14/16 2029  . saccharomyces boulardii (FLORASTOR) capsule 250 mg  250 mg Oral BID  Albertine Patricia, MD      . sertraline (ZOLOFT) tablet 37.5 mg  37.5 mg Oral Daily Orvan Falconer, MD      . sodium chloride flush (NS) 0.9 % injection 3 mL  3 mL Intravenous Q12H Orvan Falconer, MD   3 mL at 04/15/16 0904  . vancomycin (VANCOCIN) IVPB 750 mg/150 ml premix  750 mg Intravenous Q24H Albertine Patricia, MD   750 mg at 04/14/16 2034     Discharge Medications: Please see discharge summary for a list of discharge medications.  Relevant Imaging Results:  Relevant Lab Results:   Additional Information    Salome Arnt, [place]

## 2016-04-16 ENCOUNTER — Encounter (HOSPITAL_COMMUNITY): Payer: Self-pay | Admitting: Radiology

## 2016-04-16 LAB — URINE CULTURE

## 2016-04-16 MED ORDER — DOCUSATE SODIUM 100 MG PO CAPS
100.0000 mg | ORAL_CAPSULE | Freq: Two times a day (BID) | ORAL | Status: DC
  Administered 2016-04-16 – 2016-04-17 (×2): 100 mg via ORAL
  Filled 2016-04-16 (×2): qty 1

## 2016-04-16 MED ORDER — ENSURE ENLIVE PO LIQD
237.0000 mL | Freq: Two times a day (BID) | ORAL | Status: DC
  Administered 2016-04-16 – 2016-04-17 (×3): 237 mL via ORAL

## 2016-04-16 MED ORDER — POLYETHYLENE GLYCOL 3350 17 G PO PACK
34.0000 g | PACK | Freq: Once | ORAL | Status: AC
  Administered 2016-04-16: 34 g via ORAL
  Filled 2016-04-16: qty 2

## 2016-04-16 MED ORDER — BISACODYL 5 MG PO TBEC
10.0000 mg | DELAYED_RELEASE_TABLET | Freq: Once | ORAL | Status: AC
  Administered 2016-04-16: 10 mg via ORAL
  Filled 2016-04-16: qty 2

## 2016-04-16 NOTE — Progress Notes (Addendum)
Patient complaining of pain to knees and hips from his  Arthritis. Also complaining he has not had a bowel movement. Nursing notified and has responded.

## 2016-04-16 NOTE — Progress Notes (Signed)
At approximately 1005 RT asked patient if she could re-instruct patient on the use of the IS and Flutter Valve .He declined at that time closed his eyes and refused further conversation. I let the patient know I would return later today.

## 2016-04-16 NOTE — Progress Notes (Signed)
PROGRESS NOTE                                                                                                                                                                                                             Patient Demographics:    Patrick [name], is a 83 y.o. male, DOB - [birth year], [id number]  Admit date - 04/13/2016   Admitting Physician Orvan Falconer, MD  Outpatient Primary MD for the patient is [name] Patient  LOS - 3  Outpatient Specialists: GI Dr. Oneida Alar  Chief Complaint  Patient presents with  . Chest Pain       Brief Narrative    83 y.o. male SNF resident with DNR Code status, hx of anxiety, HTN, CHF, ischemic CMP, brought to the ER with respiratory problem.  He was found cyanotic, workup significant for pneumonia, and abnormal urinalysis.   Subjective:    Patrick [name] today Denies any complaints, A porcine of the pulmonic sleep, denies any chest pain, shortness of breath, fever or chills, patient noticed to have cough whenever he is fed, made nothing by mouth overnight.   Assessment  & Plan :    Principal Problem:   Healthcare-associated pneumonia Active Problems:   CHF (congestive heart failure) (HCC)   Osteoarthritis of left hip   HCAP (healthcare-associated pneumonia)   Pressure injury of skin   HCAP - Chest x-ray significant for left lower basal opacity, Likely on IV vancomycin and cefepime for HCAP, stopped IV vancomycin 3/2  given negative blood cultures ,changde cefepime to Zosyn to cover for aspiration , SLP input greatly appreciated, patient with silent aspiration , continue with dysphagia 2 with thin liquid diet, will need further SLP follow-up at facility after discharge - Continue to follow blood cultures - Appears to be improving, there is diffuse IS and flutter valve, also he can transition to by mouth Augmentin in 1-2 days  Abnormal urinalysis - Patient with chronic indwelling Foley catheter,  changed Foley catheter 3/1, follow urine cultures Not helpful as growing multiple species, no indication to treat  History of urinary retention - Continue with Foley catheter  Restless leg syndrome - Continue with Requip  GERD - Continue with PPI  Code Status : DNR  Family Communication  : None at bedside  Disposition Plan  : back to SNF when stable  Consults  :  None  Procedures  : None  DVT Prophylaxis  :  Heparin   Lab Results  Component Value Date   PLT 341 04/15/2016    Antibiotics  :    Anti-infectives    Start     Dose/Rate Route Frequency Ordered Stop   04/15/16 0800  piperacillin-tazobactam (ZOSYN) IVPB 3.375 g     3.375 g 12.5 mL/hr over 240 Minutes Intravenous Every 8 hours 04/15/16 0736     04/14/16 2330  ceFEPIme (MAXIPIME) 1 g in dextrose 5 % 50 mL IVPB  Status:  Discontinued     1 g 100 mL/hr over 30 Minutes Intravenous Every 24 hours 04/14/16 0108 04/15/16 0730   04/14/16 2200  vancomycin (VANCOCIN) IVPB 750 mg/150 ml premix  Status:  Discontinued     750 mg 150 mL/hr over 60 Minutes Intravenous Every 24 hours 04/14/16 0741 04/15/16 1501   04/14/16 2200  ceFEPIme (MAXIPIME) 2 g in dextrose 5 % 50 mL IVPB  Status:  Discontinued     2 g 100 mL/hr over 30 Minutes Intravenous Every 24 hours 04/14/16 0741 04/15/16 0736   04/13/16 2315  ceFEPIme (MAXIPIME) 2 g in dextrose 5 % 50 mL IVPB     2 g 100 mL/hr over 30 Minutes Intravenous  Once 04/13/16 2314 04/13/16 2352   04/13/16 2315  vancomycin (VANCOCIN) IVPB 1000 mg/200 mL premix     1,000 mg 200 mL/hr over 60 Minutes Intravenous  Once 04/13/16 2314 04/14/16 0046        Objective:   Vitals:   04/15/16 0634 04/15/16 1700 04/15/16 2216 04/16/16 0551  BP: (!) 156/76 134/68 (!) 148/80 140/65  Pulse: 80 81 80 80  Resp: 18 18 16 15   Temp: 97.5 F (36.4 C) 97.8 F (36.6 C) 98.6 F (37 C) 97.7 F (36.5 C)  TempSrc: Axillary Oral Oral Oral  SpO2: 95% 97% 97% 99%  Weight:    73.6 kg (162 lb 4.8 oz)    Height:        Wt Readings from Last 3 Encounters:  04/16/16 73.6 kg (162 lb 4.8 oz)  03/01/16 80.5 kg (177 lb 6.4 oz)  11/19/15 76.3 kg (168 lb 3.2 oz)     Intake/Output Summary (Last 24 hours) at 04/16/16 1120 Last data filed at 04/16/16 0900  Gross per 24 hour  Intake              980 ml  Output              550 ml  Net              430 ml     Physical Exam  Awake Alert,  confused Supple Neck,No JVD, .  Symmetrical Chest wall movement, Good air movement bilaterally, CTAB RRR,No Gallops,Rubs , No Parasternal Heave +ve B.Sounds, Abd Soft, no tenderness, No rebound - guarding or rigidity. No Cyanosis, Clubbing or edema, No new Rash or bruise      Data Review:    CBC  Recent Labs Lab 04/13/16 2220 04/14/16 0544 04/15/16 0422  WBC 15.7* 15.2* 12.5*  HGB 10.3* 9.9* 9.9*  HCT 31.4* 30.0* 29.8*  PLT 358 329 341  MCV 82.4 82.6 82.1  MCH 27.0 27.3 27.3  MCHC 32.8 33.0 33.2  RDW 14.4 14.3 14.3  LYMPHSABS 1.4  --   --   MONOABS 1.8*  --   --   EOSABS 0.0  --   --   BASOSABS 0.0  --   --  Chemistries   Recent Labs Lab 04/13/16 2220 04/14/16 0544 04/15/16 0422  NA 132* 133* 133*  K 4.2 3.8 3.5  CL 102 99* 101  CO2 21* 24 23  GLUCOSE 131* 121* 113*  BUN 17 16 14   CREATININE 1.24 1.08 0.93  CALCIUM 8.2* 8.3* 8.4*  AST 23 17  --   ALT 19 16*  --   ALKPHOS 80 75  --   BILITOT 0.6 0.7  --    ------------------------------------------------------------------------------------------------------------------ No results for input(s): CHOL, HDL, LDLCALC, TRIG, CHOLHDL, LDLDIRECT in the last 72 hours.  Lab Results  Component Value Date   HGBA1C 6.1 (H) 11/12/2014   ------------------------------------------------------------------------------------------------------------------  Recent Labs  04/13/16 2220  TSH 1.323   ------------------------------------------------------------------------------------------------------------------ No results for  input(s): VITAMINB12, FOLATE, FERRITIN, TIBC, IRON, RETICCTPCT in the last 72 hours.  Coagulation profile No results for input(s): INR, PROTIME in the last 168 hours.  No results for input(s): DDIMER in the last 72 hours.  Cardiac Enzymes No results for input(s): CKMB, TROPONINI, MYOGLOBIN in the last 168 hours.  Invalid input(s): CK ------------------------------------------------------------------------------------------------------------------    Component Value Date/Time   BNP 245.0 (H) 04/12/2015 1535    Inpatient Medications  Scheduled Meds: . aspirin EC  81 mg Oral Daily  . docusate sodium  100 mg Oral Daily  . feeding supplement (ENSURE ENLIVE)  237 mL Oral BID BM  . heparin  5,000 Units Subcutaneous Q8H  . mouth rinse  15 mL Mouth Rinse BID  . pantoprazole  40 mg Oral Daily  . piperacillin-tazobactam (ZOSYN)  IV  3.375 g Intravenous Q8H  . rOPINIRole  2 mg Oral QHS  . saccharomyces boulardii  250 mg Oral BID  . sertraline  37.5 mg Oral Daily  . sodium chloride flush  3 mL Intravenous Q12H   Continuous Infusions: PRN Meds:.acetaminophen **OR** acetaminophen  Micro Results Recent Results (from the past 240 hour(s))  Culture, blood (routine x 2)     Status: None (Preliminary result)   Collection Time: 04/13/16 11:14 PM  Result Value Ref Range Status   Specimen Description RIGHT ANTECUBITAL  Final   Special Requests BOTTLES DRAWN AEROBIC AND ANAEROBIC [place]  Final   Culture NO GROWTH 3 DAYS  Final   Report Status PENDING  Incomplete  Culture, blood (routine x 2)     Status: None (Preliminary result)   Collection Time: 04/13/16 11:21 PM  Result Value Ref Range Status   Specimen Description LEFT ANTECUBITAL  Final   Special Requests BOTTLES DRAWN AEROBIC AND ANAEROBIC [place]  Final   Culture NO GROWTH 3 DAYS  Final   Report Status PENDING  Incomplete  MRSA PCR Screening     Status: Abnormal   Collection Time: 04/14/16  1:07 AM  Result Value Ref Range Status   MRSA  by PCR POSITIVE (A) NEGATIVE Final    Comment:        The GeneXpert MRSA Assay (FDA approved for NASAL specimens only), is one component of a comprehensive MRSA colonization surveillance program. It is not intended to diagnose MRSA infection nor to guide or monitor treatment for MRSA infections. RESULT CALLED TO, READ BACK BY AND VERIFIED WITH: HYLTON L. AT [id number] ON [id number] BY THOMPSON S.   Culture, Urine     Status: Abnormal   Collection Time: 04/14/16  7:53 AM  Result Value Ref Range Status   Specimen Description URINE, CLEAN CATCH  Final   Special Requests NONE  Final   Culture MULTIPLE SPECIES PRESENT, SUGGEST  RECOLLECTION (A)  Final   Report Status 04/16/2016 FINAL  Final    Radiology Reports Dg Chest Port 1 View  Result Date: 04/15/2016 CLINICAL DATA:  Aspiration into airway.  Cough. EXAM: PORTABLE CHEST 1 VIEW COMPARISON:  Radiographs 04/13/2016 FINDINGS: Increasing left basilar opacity with possible developing pleural effusion. Unchanged right basilar atelectasis. Unchanged heart size and mediastinal contours with thoracic aortic atherosclerosis. No evidence of pulmonary edema. No pneumothorax. IMPRESSION: Worsening left basilar aeration with increasing opacity and possible pleural effusion. Unchanged bibasilar atelectasis. Electronically Signed   By: Jeb Levering M.D.   On: 04/15/2016 01:07   Dg Chest Port 1 View  Result Date: 04/13/2016 CLINICAL DATA:  Left-sided chest pain and cough for 2 days. EXAM: PORTABLE CHEST 1 VIEW COMPARISON:  Chest radiograph 04/28/2015 FINDINGS: Patchy and confluent left lung base opacities suspicious for pneumonia. Streaky right infrahilar opacities likely atelectasis. Mild cardiomegaly with unchanged aortic tortuosity and atherosclerosis. There is mild vascular congestion without overt pulmonary edema. Possible small left pleural effusion. No pneumothorax. Stable osseous structures. IMPRESSION: Patchy and confluent left lung base opacity  suspicious for pneumonia. Right infrahilar atelectasis. Electronically Signed   By: Jeb Levering M.D.   On: 04/13/2016 22:47     Shirell Struthers M.D on 04/16/2016 at 11:20 AM  Between 7am to 7pm - Pager - [phone]  After 7pm go to [email or web address] - password [facility]  [facility] -  Office  [phone]

## 2016-04-17 ENCOUNTER — Inpatient Hospital Stay
Admission: RE | Admit: 2016-04-17 | Discharge: 2017-01-02 | Disposition: A | Discharge status: Other Acute Inpatient Hospital | Payer: Medicare Other | Source: Ambulatory Visit | Attending: Internal Medicine | Admitting: Internal Medicine

## 2016-04-17 MED ORDER — DOCUSATE SODIUM 100 MG PO CAPS: 100.0000 mg | ORAL_CAPSULE | Freq: Two times a day (BID) | ORAL | 0 refills | Status: DC

## 2016-04-17 MED ORDER — SACCHAROMYCES BOULARDII 250 MG PO CAPS: 250.0000 mg | ORAL_CAPSULE | Freq: Two times a day (BID) | ORAL | 0 refills | Status: DC

## 2016-04-17 MED ORDER — ENSURE ENLIVE PO LIQD: 237.0000 mL | Freq: Two times a day (BID) | ORAL | 12 refills | Status: DC

## 2016-04-17 MED ORDER — MUPIROCIN 2 % EX OINT: 1.0000 "application " | TOPICAL_OINTMENT | Freq: Two times a day (BID) | CUTANEOUS | 0 refills | Status: AC

## 2016-04-17 MED ORDER — CHLORHEXIDINE GLUCONATE CLOTH 2 % EX PADS
6.0000 | MEDICATED_PAD | Freq: Every day | CUTANEOUS | Status: DC
  Administered 2016-04-17: 6 via TOPICAL

## 2016-04-17 MED ORDER — AMOXICILLIN-POT CLAVULANATE 875-125 MG PO TABS: 1.0000 | ORAL_TABLET | Freq: Two times a day (BID) | ORAL | 0 refills | Status: AC

## 2016-04-17 MED ORDER — FLEET ENEMA 7-19 GM/118ML RE ENEM
1.0000 | ENEMA | Freq: Once | RECTAL | Status: AC
Start: 2016-04-17 — End: 2016-04-17
  Administered 2016-04-17: 1 via RECTAL

## 2016-04-17 MED ORDER — HYDROCODONE-ACETAMINOPHEN 5-325 MG PO TABS: 1.0000 | ORAL_TABLET | Freq: Four times a day (QID) | ORAL | 0 refills | Status: DC | PRN

## 2016-04-17 MED ORDER — HYDROCODONE-ACETAMINOPHEN 5-325 MG PO TABS: ORAL_TABLET | ORAL | 0 refills | Status: DC

## 2016-04-17 MED ORDER — MUPIROCIN 2 % EX OINT
1.0000 "application " | TOPICAL_OINTMENT | Freq: Two times a day (BID) | CUTANEOUS | Status: DC
  Administered 2016-04-17 (×2): 1 via NASAL
  Filled 2016-04-17: qty 22

## 2016-04-17 NOTE — Discharge Instructions (Signed)
Follow with SNF physician in 3 days  Get CBC, CMP, 2 view Chest X ray checked  One week   Activity: As tolerated with Full fall precautions use walker/cane & assistance as needed   Disposition SNF   Diet: Dysphagia 3 with thin liquid, with feeding assistance and aspiration precautions.pt continues to be at an increased risk of aspiration. Risk is reduced by utilizing the following strategies: small sips, 1 sip at a time, swallow 2 times with liquids. Recommend initiating dysphagia 3 diet, thin liquids, meds crushed in puree, full supervision to cue for strategies, have pt sit upright 30-60 minutes after meal.  For Heart failure patients - Check your Weight same time everyday, if you gain over 2 pounds, or you develop in leg swelling, experience more shortness of breath or chest pain, call your Primary MD immediately. Follow Cardiac Low Salt Diet and 1.5 lit/day fluid restriction.   On your next visit with your primary care physician please Get Medicines reviewed and adjusted.   Please request your Prim.MD to go over all Hospital Tests and Procedure/Radiological results at the follow up, please get all Hospital records sent to your Prim MD by signing hospital release before you go home.   If you experience worsening of your admission symptoms, develop shortness of breath, life threatening emergency, suicidal or homicidal thoughts you must seek medical attention immediately by calling 911 or calling your MD immediately  if symptoms less severe.  You Must read complete instructions/literature along with all the possible adverse reactions/side effects for all the Medicines you take and that have been prescribed to you. Take any new Medicines after you have completely understood and accpet all the possible adverse reactions/side effects.   Do not drive, operating heavy machinery, perform activities at heights, swimming or participation in water activities or provide baby sitting services if your  were admitted for syncope or siezures until you have seen by Primary MD or a Neurologist and advised to do so again.  Do not drive when taking Pain medications.    Do not take more than prescribed Pain, Sleep and Anxiety Medications  Special Instructions: If you have smoked or chewed Tobacco  in the last 2 yrs please stop smoking, stop any regular Alcohol  and or any Recreational drug use.  Wear Seat belts while driving.   Please note  You were cared for by a hospitalist during your hospital stay. If you have any questions about your discharge medications or the care you received while you were in the hospital after you are discharged, you can call the unit and asked to speak with the hospitalist on call if the hospitalist that took care of you is not available. Once you are discharged, your primary care physician will handle any further medical issues. Please note that NO REFILLS for any discharge medications will be authorized once you are discharged, as it is imperative that you return to your primary care physician (or establish a relationship with a primary care physician if you do not have one) for your aftercare needs so that they can reassess your need for medications and monitor your lab values.

## 2016-04-17 NOTE — Progress Notes (Signed)
Pt d/c back to [facility]. D/c info sent via EPIC hub. Notified [facility]. AP RN to call report and notify pt and family of transition back to [facility]. No further social work needs identified at this time. CSW signing off.   Alison Murray, MSW, LCSW Clinical Social Worker Weekend coverage [phone]

## 2016-04-17 NOTE — Discharge Summary (Signed)
Patrick [name], is a 83 y.o. male  DOB August 19, 1913  MRN [id number].  Admission date:  04/13/2016  Admitting Physician  Orvan Falconer, MD  Discharge Date:  04/17/2016   Primary MD  [name] Patient  Recommendations for primary care /SNF physician for things to follow:  - Please check CBC, BMP in 1 week - Please repeat 2 view chest x-ray in 2 weeks to ensure resolution of pneumonia - Patient is DO NOT RESUSCITATE - Will need continued follow-up with SLP regarding aspiration risk, please see recommendation about diet   Admission Diagnosis  HCAP (healthcare-associated pneumonia) [J18.9] Chest pain [R07.9]   Discharge Diagnosis  HCAP (healthcare-associated pneumonia) [J18.9] Chest pain [R07.9]    Principal Problem:   Healthcare-associated pneumonia Active Problems:   CHF (congestive heart failure) (August)   Osteoarthritis of left hip   HCAP (healthcare-associated pneumonia)   Pressure injury of skin      Past Medical History:  Diagnosis Date  . Anxiety   . Bilateral foot pain   . CHF (congestive heart failure) ([place])   . Diverticulitis   . DJD (degenerative joint disease), cervical   . DNR (do not resuscitate)   . Esophageal dysmotility    age related per BPE 04/2015  . Esophageal stricture   . GERD (gastroesophageal reflux disease)   . Gout   . Hiatal hernia   . History of recurrent TIAs   . HTN (hypertension)   . Hyperlipidemia   . Inguinal hernia   . Ischemic heart disease   . Kidney stone   . Osteoarthritis    bilat knees  . Pleural effusion   . Reflux   . Restless leg syndrome   . Right hip pain   . Skin cancer, basal cell   . Urinary retention     Past Surgical History:  Procedure Laterality Date  . ESOPHAGEAL DILATION    . EXPLORATORY LAPAROTOMY W/ BOWEL RESECTION    . FRACTURE SURGERY    . HEMIARTHROPLASTY HIP     Dr. Aline Brochure  . intestines         History of  present illness and  Hospital Course:     Kindly see H&P for history of present illness and admission details, please review complete Labs, Consult reports and Test reports for all details in brief  HPI  from the history and physical done on the day of admission 04/13/2016  Patrick [name] is an 83 y.o. male SNF resident with DNR Code status, hx of anxiety, HTN, CHF, ischemic CMP, brought to the ER with respiratory problem.  He was found cyanotic.  In the ER, he felt better with supplemental [place] oxygen.  Work up included a CXR with PNA, and leukocytosis with WBC of 15K, lactic acid was normal, and Cr was 1.2.  He was started on IV Cefepime and IV Vancomycin.  BC were obtained.  EDP spoke with family and also gotten information that he hadn't been eating well, and decision was made to admit him into  the hospital for HCAP.      Hospital Course  83 y.o.maleSNF resident with DNR Code status, hx of anxiety, HTN, CHF, ischemic CMP, brought to the ER with respiratory problem. He was found cyanotic, workup significant for pneumonia, and abnormal urinalysis.   HCAP - Chest x-ray significant for left lower basal opacity, Likely on IV vancomycin and cefepime for HCAP, stopped IV vancomycin 3/2  given negative blood cultures ,changdeD cefepime to Zosyn to cover for aspiration , SLP input greatly appreciated, patient with silent aspiration , continue with dysphagia 3 with thin liquid diet, will need further SLP follow-up at facility after discharge, see recommendation on dissection denies risk of aspiration, patient to finish another 3 days of by mouth Augmentin on discharge - Appears to be improving, encouraged to use IS and flutter valve.  Abnormal urinalysis - Patient with chronic indwelling Foley catheter, changed Foley catheter 3/1, follow urine cultures Not helpful as growing multiple species, no indication to treat  History of urinary retention - Continue with Foley catheter  Restless leg  syndrome - Continue with Requip  GERD - Continue with PPI   Discharge Condition:  Stable   Follow UP   with SNF physician within 3 days   Discharge Instructions  and  Discharge Medications     Discharge Instructions    Discharge instructions    Complete by:  As directed    Follow with SNF physician in 3 days  Get CBC, CMP, 2 view Chest X ray checked  One week   Activity: As tolerated with Full fall precautions use walker/cane & assistance as needed   Disposition SNF   Diet: Dysphagia 3 with thin liquid, with feeding assistance and aspiration precautions.pt continues to be at an increased risk of aspiration. Risk is reduced by utilizing the following strategies: small sips, 1 sip at a time, swallow 2 times with liquids. Recommend initiating dysphagia 3 diet, thin liquids, meds crushed in puree, full supervision to cue for strategies, have pt sit upright 30-60 minutes after meal.  For Heart failure patients - Check your Weight same time everyday, if you gain over 2 pounds, or you develop in leg swelling, experience more shortness of breath or chest pain, call your Primary MD immediately. Follow Cardiac Low Salt Diet and 1.5 lit/day fluid restriction.   On your next visit with your primary care physician please Get Medicines reviewed and adjusted.   Please request your Prim.MD to go over all Hospital Tests and Procedure/Radiological results at the follow up, please get all Hospital records sent to your Prim MD by signing hospital release before you go home.   If you experience worsening of your admission symptoms, develop shortness of breath, life threatening emergency, suicidal or homicidal thoughts you must seek medical attention immediately by calling 911 or calling your MD immediately  if symptoms less severe.  You Must read complete instructions/literature along with all the possible adverse reactions/side effects for all the Medicines you take and that have been  prescribed to you. Take any new Medicines after you have completely understood and accpet all the possible adverse reactions/side effects.   Do not drive, operating heavy machinery, perform activities at heights, swimming or participation in water activities or provide baby sitting services if your were admitted for syncope or siezures until you have seen by Primary MD or a Neurologist and advised to do so again.  Do not drive when taking Pain medications.    Do not take more than prescribed Pain, Sleep  and Anxiety Medications  Special Instructions: If you have smoked or chewed Tobacco  in the last 2 yrs please stop smoking, stop any regular Alcohol  and or any Recreational drug use.  Wear Seat belts while driving.   Please note  You were cared for by a hospitalist during your hospital stay. If you have any questions about your discharge medications or the care you received while you were in the hospital after you are discharged, you can call the unit and asked to speak with the hospitalist on call if the hospitalist that took care of you is not available. Once you are discharged, your primary care physician will handle any further medical issues. Please note that NO REFILLS for any discharge medications will be authorized once you are discharged, as it is imperative that you return to your primary care physician (or establish a relationship with a primary care physician if you do not have one) for your aftercare needs so that they can reassess your need for medications and monitor your lab values.   Increase activity slowly    Complete by:  As directed      Allergies as of 04/17/2016      Reactions   Celebrex [celecoxib] Other (See Comments)   Unknown; patient can't remember   Codeine Other (See Comments)   nausea      Medication List    TAKE these medications   amoxicillin-clavulanate 875-125 MG tablet Commonly known as:  AUGMENTIN Take 1 tablet by mouth 2 (two) times daily.     aspirin EC 81 MG tablet Take 81 mg by mouth daily.   CAMPHOR-EUCALYPTUS-MENTHOL EX Apply 1 application topically 2 (two) times daily as needed.   docusate sodium 100 MG capsule Commonly known as:  COLACE Take 1 capsule (100 mg total) by mouth 2 (two) times daily. For constipation. What changed:  when to take this   feeding supplement (ENSURE ENLIVE) Liqd Take 237 mLs by mouth 2 (two) times daily between meals.   guaifenesin 100 MG/5ML syrup Commonly known as:  ROBITUSSIN Take 300 mg by mouth every 6 (six) hours as needed for cough or congestion.   HYDROcodone-acetaminophen 5-325 MG tablet Commonly known as:  NORCO/VICODIN Take 1 tablet by mouth every 6 (six) hours as needed for moderate pain. Take one tablet by mouth twice daily and every 6 hours as needed for pain. Max APAP 3gm/24 hours from all sources What changed:  how much to take  how to take this  when to take this  reasons to take this   magnesium hydroxide 400 MG/5ML suspension Commonly known as:  MILK OF MAGNESIA Take 30 mLs by mouth daily as needed for mild constipation. If no relief try a Fleets enema, after 2 days.   Menthol 4.8 MG Lozg Use as directed 4.8 mg in the mouth or throat every 4 (four) hours as needed (for sore throat and cough).   mupirocin ointment 2 % Commonly known as:  BACTROBAN Place 1 application into the nose 2 (two) times daily.   NON FORMULARY Take by mouth 2 (two) times daily. MAGIC CUP   omeprazole 20 MG capsule Commonly known as:  PRILOSEC Take 20 mg by mouth daily.   rOPINIRole 2 MG tablet Commonly known as:  REQUIP Take 2 mg by mouth at bedtime.   saccharomyces boulardii 250 MG capsule Commonly known as:  FLORASTOR Take 1 capsule (250 mg total) by mouth 2 (two) times daily.   sertraline 25 MG tablet Commonly known  as:  ZOLOFT Take 37.5 mg by mouth daily.   Vitamin D 2000 units Caps Take 1 capsule by mouth daily.         Diet and Activity recommendation: See  Discharge Instructions above   Consults obtained -  None   Major procedures and Radiology Reports - PLEASE review detailed and final reports for all details, in brief -     Dg Chest Port 1 View  Result Date: 04/15/2016 CLINICAL DATA:  Aspiration into airway.  Cough. EXAM: PORTABLE CHEST 1 VIEW COMPARISON:  Radiographs 04/13/2016 FINDINGS: Increasing left basilar opacity with possible developing pleural effusion. Unchanged right basilar atelectasis. Unchanged heart size and mediastinal contours with thoracic aortic atherosclerosis. No evidence of pulmonary edema. No pneumothorax. IMPRESSION: Worsening left basilar aeration with increasing opacity and possible pleural effusion. Unchanged bibasilar atelectasis. Electronically Signed   By: Jeb Levering M.D.   On: 04/15/2016 01:07   Dg Chest Port 1 View  Result Date: 04/13/2016 CLINICAL DATA:  Left-sided chest pain and cough for 2 days. EXAM: PORTABLE CHEST 1 VIEW COMPARISON:  Chest radiograph 04/28/2015 FINDINGS: Patchy and confluent left lung base opacities suspicious for pneumonia. Streaky right infrahilar opacities likely atelectasis. Mild cardiomegaly with unchanged aortic tortuosity and atherosclerosis. There is mild vascular congestion without overt pulmonary edema. Possible small left pleural effusion. No pneumothorax. Stable osseous structures. IMPRESSION: Patchy and confluent left lung base opacity suspicious for pneumonia. Right infrahilar atelectasis. Electronically Signed   By: Jeb Levering M.D.   On: 04/13/2016 22:47    Micro Results    Recent Results (from the past 240 hour(s))  Culture, blood (routine x 2)     Status: None (Preliminary result)   Collection Time: 04/13/16 11:14 PM  Result Value Ref Range Status   Specimen Description RIGHT ANTECUBITAL  Final   Special Requests BOTTLES DRAWN AEROBIC AND ANAEROBIC [place]  Final   Culture NO GROWTH 4 DAYS  Final   Report Status PENDING  Incomplete  Culture, blood (routine x  2)     Status: None (Preliminary result)   Collection Time: 04/13/16 11:21 PM  Result Value Ref Range Status   Specimen Description LEFT ANTECUBITAL  Final   Special Requests BOTTLES DRAWN AEROBIC AND ANAEROBIC [place]  Final   Culture NO GROWTH 4 DAYS  Final   Report Status PENDING  Incomplete  MRSA PCR Screening     Status: Abnormal   Collection Time: 04/14/16  1:07 AM  Result Value Ref Range Status   MRSA by PCR POSITIVE (A) NEGATIVE Final    Comment:        The GeneXpert MRSA Assay (FDA approved for NASAL specimens only), is one component of a comprehensive MRSA colonization surveillance program. It is not intended to diagnose MRSA infection nor to guide or monitor treatment for MRSA infections. RESULT CALLED TO, READ BACK BY AND VERIFIED WITH: HYLTON L. AT [id number] ON [id number] BY THOMPSON S.   Culture, Urine     Status: Abnormal   Collection Time: 04/14/16  7:53 AM  Result Value Ref Range Status   Specimen Description URINE, CLEAN CATCH  Final   Special Requests NONE  Final   Culture MULTIPLE SPECIES PRESENT, SUGGEST RECOLLECTION (A)  Final   Report Status 04/16/2016 FINAL  Final       Today   Subjective:   Danielle Dess today has no headache,no chest or abdominal pain, feels much better  Today, he reports constipation, but was notified by the nurse he had a good  bowel movement overnight.  Objective:   Blood pressure (!) 149/59, pulse 76, temperature 98 F (36.7 C), temperature source Oral, resp. rate 16, height 5\' 11"  (1.803 m), weight 72 kg (158 lb 11.7 oz), SpO2 97 %.   Intake/Output Summary (Last 24 hours) at 04/17/16 1025 Last data filed at 04/17/16 [id number]  Gross per 24 hour  Intake              326 ml  Output              850 ml  Net             -524 ml    Exam Awake Alert,  confused Supple Neck,No JVD, .  Symmetrical Chest wall movement, Good air movement bilaterally, CTAB RRR,No Gallops,Rubs , No Parasternal Heave +ve B.Sounds, Abd Soft, no tenderness,  No rebound - guarding or rigidity. No Cyanosis, Clubbing or edema, No new Rash or bruise    Data Review   CBC w Diff: Lab Results  Component Value Date   WBC 12.5 (H) 04/15/2016   HGB 9.9 (L) 04/15/2016   HCT 29.8 (L) 04/15/2016   PLT 341 04/15/2016   LYMPHOPCT 9 04/13/2016   MONOPCT 11 04/13/2016   EOSPCT 0 04/13/2016   BASOPCT 0 04/13/2016    CMP: Lab Results  Component Value Date   NA 133 (L) 04/15/2016   NA 137 06/10/2015   K 3.5 04/15/2016   CL 101 04/15/2016   CO2 23 04/15/2016   BUN 14 04/15/2016   BUN 21 06/10/2015   CREATININE 0.93 04/15/2016   GLU 92 06/10/2015   PROT 6.8 04/14/2016   ALBUMIN 2.6 (L) 04/14/2016   BILITOT 0.7 04/14/2016   ALKPHOS 75 04/14/2016   AST 17 04/14/2016   ALT 16 (L) 04/14/2016  .   Total Time in preparing paper work, data evaluation and todays exam - 35 minutes  Keyshia Orwick M.D on 04/17/2016 at 10:25 AM  [facility]   Office  [phone]

## 2016-04-17 NOTE — Progress Notes (Signed)
First few hours of shift, patient anxious, agitated, yelling out. Staff members approached patient multiple times. Patient upset about constipation, even after multiple times education of laxatives already given and need for time. This RN also gave patient Colace PO as ordered and Tylenol PO as ordered PRN d/t chronic pain. Patient was repositioned and incontinence of bowels was found. Incontinence care provided and patient repositioned. Patient appeared more comfortable and slept for a short period. Approximately 0000, patient was heard talking to himself in his room when there was no one in there. This RN could not understand what was said. Patient started yelling out again. When this RN entered room, patient asked what the IV pump and antibiotic was and wanted it stopped. Education given. Since then, patient sleeping off and on. Will continue to monitor.

## 2016-04-17 NOTE — Progress Notes (Signed)
Patient with orders to be discharge to [facility]. Report given to [facility], Nurse. Discharge instructions sent with patient. Prescriptions sent with patient. Patient stable. Patient transported via staff to [facility].

## 2016-04-18 ENCOUNTER — Telehealth: Payer: Self-pay

## 2016-04-18 LAB — CULTURE, BLOOD (ROUTINE X 2)
Culture: NO GROWTH
Culture: NO GROWTH

## 2016-04-18 NOTE — Telephone Encounter (Signed)
Possible re-admission to facility. This is a patient you were seeing at [facility]. [facility] F/U is needed if patient was re-admitted to facility upon discharge. Hospital discharge from [facility] on 04/17/16.

## 2016-04-22 ENCOUNTER — Non-Acute Institutional Stay (SKILLED_NURSING_FACILITY): Payer: Medicare Other | Admitting: Internal Medicine

## 2016-04-22 ENCOUNTER — Encounter (HOSPITAL_COMMUNITY)
Admission: RE | Admit: 2016-04-22 | Discharge: 2016-04-22 | Disposition: A | Discharge status: Home / Self Care | Payer: Medicare Other | Source: Skilled Nursing Facility | Attending: Internal Medicine | Admitting: Internal Medicine

## 2016-04-22 ENCOUNTER — Encounter: Payer: Self-pay | Admitting: Internal Medicine

## 2016-04-22 DIAGNOSIS — J189 Pneumonia, unspecified organism: Secondary | ICD-10-CM

## 2016-04-22 DIAGNOSIS — I509 Heart failure, unspecified: Secondary | ICD-10-CM | POA: Insufficient documentation

## 2016-04-22 DIAGNOSIS — R1310 Dysphagia, unspecified: Secondary | ICD-10-CM

## 2016-04-22 DIAGNOSIS — G2581 Restless legs syndrome: Secondary | ICD-10-CM

## 2016-04-22 DIAGNOSIS — J449 Chronic obstructive pulmonary disease, unspecified: Secondary | ICD-10-CM | POA: Diagnosis not present

## 2016-04-22 LAB — CBC
HEMATOCRIT: 34.2 % — AB (ref 39.0–52.0)
Hemoglobin: 11 g/dL — ABNORMAL LOW (ref 13.0–17.0)
MCH: 26.8 pg (ref 26.0–34.0)
MCHC: 32.2 g/dL (ref 30.0–36.0)
MCV: 83.2 fL (ref 78.0–100.0)
Platelets: 277 10*3/uL (ref 150–400)
RBC: 4.11 MIL/uL — ABNORMAL LOW (ref 4.22–5.81)
RDW: 14.4 % (ref 11.5–15.5)
WBC: 8.3 10*3/uL (ref 4.0–10.5)

## 2016-04-22 LAB — BASIC METABOLIC PANEL
ANION GAP: 7 (ref 5–15)
BUN: 12 mg/dL (ref 6–20)
CALCIUM: 8.7 mg/dL — AB (ref 8.9–10.3)
CHLORIDE: 99 mmol/L — AB (ref 101–111)
CO2: 27 mmol/L (ref 22–32)
Creatinine, Ser: 0.87 mg/dL (ref 0.61–1.24)
GFR calc Af Amer: >60 mL/min (ref >60)
GFR calc non Af Amer: >60 mL/min (ref >60)
GLUCOSE: 98 mg/dL (ref 65–99)
POTASSIUM: 4.2 mmol/L (ref 3.5–5.1)
Sodium: 133 mmol/L — ABNORMAL LOW (ref 135–145)

## 2016-04-22 NOTE — Progress Notes (Signed)
Location:   [place] Room Number: 139/W Place of Service:  SNF (31) Provider:  [place] Lewis  [name] Patient  Patient Care Team: [name] Patient as PCP - General (General Practice) Danie Binder, MD as Consulting Physician (Gastroenterology) [name], MD as Consulting Physician [facility])  Extended Emergency Contact Information Primary Emergency Contact: Black,Shirley Address: [address]          [place], [place] [address] [name] of [place] Phone: [phone] Mobile Phone: [phone] Relation: Daughter  Code Status:  DNR Goals of care: Advanced Directive information Advanced Directives 04/22/2016  Does Patient Have a Medical Advance Directive? Yes  Type of Advance Directive Out of facility DNR (pink MOST or yellow form)  Does patient want to make changes to medical advance directive? No - Patient declined  Copy of [place] in Chart? -  Pre-existing out of facility DNR order (yellow form or pink MOST form) -     Chief Complaint  Patient presents with  . Acute Visit    HPI:  Pt is a 83 y.o. male seen today for an acute visit for Follow-up of hospitalization for pneumonia.  Patient was found to be cyanotic and short of breath in his room.  Chest x-ray in hospital showed left lower lobe base opacity he was treated with IV vancomycin as well as cefepime initially IV vancomycin was stopped because blood cultures were negative.  Eventually antibiotics were switched to Zosyn to cover for aspiration.  Speech therapy evaluated him and he was found to have silent aspiration recommendation was to continue with dysphagia 3 diet with thin liquids-he will have follow-up here as well by speech therapy.  He has finished an additional 5 day course of Augmentin.  Patient has an indwelling Foley catheter this was changed during his hospitalization urine culture was drawn that showed only multiple species.  His other  medical conditions appear to be stable during hospitalization-since his return it appears he's been afebrile he says his breathing is improved but says his appetite is not good-   Past Medical History:  Diagnosis Date  . Anxiety   . Bilateral foot pain   . CHF (congestive heart failure) ([place])   . Diverticulitis   . DJD (degenerative joint disease), cervical   . DNR (do not resuscitate)   . Esophageal dysmotility    age related per BPE 04/2015  . Esophageal stricture   . GERD (gastroesophageal reflux disease)   . Gout   . Hiatal hernia   . History of recurrent TIAs   . HTN (hypertension)   . Hyperlipidemia   . Inguinal hernia   . Ischemic heart disease   . Kidney stone   . Osteoarthritis    bilat knees  . Pleural effusion   . Reflux   . Restless leg syndrome   . Right hip pain   . Skin cancer, basal cell   . Urinary retention    Past Surgical History:  Procedure Laterality Date  . ESOPHAGEAL DILATION    . EXPLORATORY LAPAROTOMY W/ BOWEL RESECTION    . FRACTURE SURGERY    . HEMIARTHROPLASTY HIP     Dr. Aline Brochure  . intestines      Allergies  Allergen Reactions  . Celebrex [Celecoxib] Other (See Comments)    Unknown; patient can't remember  . Codeine Other (See Comments)    nausea    Allergies as of 04/22/2016  Reactions   Celebrex [celecoxib] Other (See Comments)   Unknown; patient can't remember   Codeine Other (See Comments)   nausea      Medication List       Accurate as of 04/22/16  4:37 PM. Always use your most recent med list.          aspirin EC 81 MG tablet Take 81 mg by mouth daily.   AUGMENTIN 875-125 MG tablet Generic drug:  amoxicillin-clavulanate Take 1 tablet by mouth 2 (two) times daily.   CAMPHOR-EUCALYPTUS-MENTHOL EX Apply 1 application topically 2 (two) times daily as needed.   docusate sodium 100 MG capsule Commonly known as:  COLACE Take 1 capsule (100 mg total) by mouth 2 (two) times daily. For constipation.     guaifenesin 100 MG/5ML syrup Commonly known as:  ROBITUSSIN Take 300 mg by mouth every 6 (six) hours as needed for cough or congestion.   HYDROcodone-acetaminophen 5-325 MG tablet Commonly known as:  NORCO/VICODIN Take 1 tablet by mouth every 6 (six) hours as needed for moderate pain. Take one tablet by mouth twice daily and every 6 hours as needed for pain. Max APAP 3gm/24 hours from all sources   magnesium hydroxide 400 MG/5ML suspension Commonly known as:  MILK OF MAGNESIA Take 30 mLs by mouth daily as needed for mild constipation. If no relief try a Fleets enema, after 2 days.   Menthol 4.8 MG Lozg Use as directed 4.8 mg in the mouth or throat every 4 (four) hours as needed (for sore throat and cough).   mupirocin ointment 2 % Commonly known as:  BACTROBAN Place 1 application into the nose 2 (two) times daily. Application into the nose   NON FORMULARY Take by mouth 2 (two) times daily. MAGIC CUP   omeprazole 20 MG capsule Commonly known as:  PRILOSEC Take 20 mg by mouth daily.   RISA-BID PROBIOTIC PO Take 1 capsule by mouth twice daily   rOPINIRole 2 MG tablet Commonly known as:  REQUIP Take 2 mg by mouth at bedtime.   sertraline 25 MG tablet Commonly known as:  ZOLOFT Take 37.5 mg by mouth daily.   Vitamin D 2000 units Caps Take 1 capsule by mouth daily.       Review of Systems   In general does not complaining of fever chills says he feels weak but does not complaining of pain.  Skin does not complain of rashes or itching.  Ears eyes nose mouth and throat does not complaining of visual changes or sore throat.  Respiratory says his breathing has improved as well as coughing.  Cardiac does not complaining of chest pain or palpitations.  GI says his appetite is not good but does not complain of abdominal discomfort does have dysphagia.  GU does not complain of dysuria has an indwelling Foley catheter.  Muscle skeletal has significant weakness and  frailty but does not complaining of joint pain currently  Neurologic is not complaining of dizziness headache or syncope.  Psych continues to be in relatively good spirits does not complain of anxiety or depression does have a history of depression is on Zoloft  Immunization History  Administered Date(s) Administered  . Influenza,inj,Quad PF,36+ Mos 12/07/2014  . Influenza-Unspecified 11/20/2013, 11/13/2015  . PPD Test 03/16/2013  . Pneumococcal-Unspecified 02/14/2009, 11/25/2015   Pertinent  Health Maintenance Due  Topic Date Due  . PNA vac Low Risk Adult (2 of 2 - PCV13) 11/24/2016  . INFLUENZA VACCINE  Completed   No flowsheet  data found. Functional Status Survey:    Temperature 97.3 pulse 76 respirations 18 blood pressure 98/58 O2 saturation is in the 90s on room air  Physical Exam   In general this is a frail elderly male who does not appear to be in any distress  Skin is warm and dry.  Chest has shallow air entry could not appreciate any overt congestion there is no labored breathing.  Heart is regular rate and rhythm without murmur gallop or rub he has some mild lower extremity edema more so C.  Abdomen soft nontender positive bowel sounds.  GU has an indwelling Foley catheter.  Muscle skeletal general frailty but is able to move all extremities 4 at baseline.  Neurologic is grossly intact to speech is clear no lateralizing findings.  Psych continues to be alert and oriented pleasant and appropriate  Labs reviewed:  Recent Labs  04/14/16 0544 04/15/16 0422 04/22/16 0700  NA 133* 133* 133*  K 3.8 3.5 4.2  CL 99* 101 99*  CO2 24 23 27   GLUCOSE 121* 113* 98  BUN 16 14 12   CREATININE 1.08 0.93 0.87  CALCIUM 8.3* 8.4* 8.7*    Recent Labs  10/14/15 0750 04/13/16 2220 04/14/16 0544  AST 18 23 17   ALT 13* 19 16*  ALKPHOS 104 80 75  BILITOT 0.4 0.6 0.7  PROT 6.8 7.0 6.8  ALBUMIN 3.4* 2.8* 2.6*    Recent Labs  04/01/16 0700 04/04/16 0700  04/13/16 2220 04/14/16 0544 04/15/16 0422 04/22/16 0700  WBC 4.8 5.6 15.7* 15.2* 12.5* 8.3  NEUTROABS 3.0 3.1 12.5*  --   --   --   HGB 10.9* 10.6* 10.3* 9.9* 9.9* 11.0*  HCT 32.1* 31.6* 31.4* 30.0* 29.8* 34.2*  MCV 83.8 82.7 82.4 82.6 82.1 83.2  PLT 108* 154 358 329 341 277   Lab Results  Component Value Date   TSH 1.323 04/13/2016   Lab Results  Component Value Date   HGBA1C 6.1 (H) 11/12/2014   No results found for: CHOL, HDL, LDLCALC, LDLDIRECT, TRIG, CHOLHDL  Significant Diagnostic Results in last 30 days:  Dg Op Swallowing Func-medicare/speech Path  Result Date: 04/21/2016 Modified Barium Swallow Progress Note  Patient Details Name: LOT MEDFORD MRN: [id number] Date of Birth: [birth year]  Today's Date: 04/15/2016  Modified Barium Swallow completed.  Brief recommendations include the following:  Clinical Impression             Pt currently with a mild oral, moderate pharyngeal dysphagia. Pt with premature spillage of thin liquids to the level of the vallecula and pyriform sinuses (inconsistent timing). Pt had silent aspiration x1 of thin liquids when consuming consecutive, large sips of thin liquid. Pt with effective cough when cued. Reduced base of tongue retraction resulted in mild-moderate residuals in the vallecula which the pt did not sense, most of which was effectively cleared with cued 2nd swallow. Chin tuck attempted with little change in residuals. Pt with known esophageal dysmotility; however, material appeared to clear through the UES and pt was not observed to have refluxed material back to the level of the UES. Pt did have prolonged mastication with solid- not able to wear dentures due to lack of adhesive. Given the occurrence of aspiration and esophageal issues noted on previous esophagram, pt continues to be at an increased risk of aspiration. Risk is reduced by utilizing the following strategies: small sips, 1 sip at a time, swallow 2 times with liquids. Recommend  initiating dysphagia 3 diet, thin liquids, meds  crushed in puree, full supervision to cue for strategies, have pt sit upright 30-60 minutes after meal. Reviewed recommendations and strategies with pt who is in agreement with plan. Will continue to follow for review of compensatory strategies. Pt is frustrated with lack of denture adhesive; please get some for him if possible.  Swallow Evaluation Recommendations   Recommended Consults: Consider esophageal assessment   SLP Diet Recommendations: Dysphagia 3 (Mech soft) solids;Thin liquid   Liquid Administration via: Cup;Straw   Medication Administration: Crushed with puree   Supervision: Patient able to self feed;Full supervision/cueing for compensatory strategies   Compensations: Slow rate;Small sips/bites;Multiple dry swallows after each bite/sip;Other (Comment) (1 sip at a time)   Postural Changes: Remain semi-upright after after feeds/meals (Comment);Seated upright at 90 degrees   Oral Care Recommendations: Oral care BID   Other Recommendations: Clarify dietary restrictions    Gilmore Laroche, Amy K, MA, CCC-SLP 04/15/2016,5:23 PM CLINICAL DATA:  Dysphagia EXAM: MODIFIED BARIUM SWALLOW TECHNIQUE: Different consistencies of barium were administered orally to the patient by the Speech Pathologist. Imaging of the pharynx was performed in the lateral projection. FLUOROSCOPY TIME:  Fluoroscopy Time:  4 minutes 24 seconds Radiation Exposure Index (if provided by the fluoroscopic device): Number of Acquired Spot Images: 0 COMPARISON:  None. FINDINGS: Thin liquid- within normal limits Nectar thick liquid- not assessed Honey- not assessed Pure- within normal limits Cracker-not assessed Pure with cracker- within normal limits Barium tablet -  within normal limits IMPRESSION: Unremarkable study. Please refer to the Speech Pathologists report for complete details and recommendations. Electronically Signed   By: Rolm Baptise M.D.   On: 04/18/2016 08:14   Dg Chest Port 1  View  Result Date: 04/15/2016 CLINICAL DATA:  Aspiration into airway.  Cough. EXAM: PORTABLE CHEST 1 VIEW COMPARISON:  Radiographs 04/13/2016 FINDINGS: Increasing left basilar opacity with possible developing pleural effusion. Unchanged right basilar atelectasis. Unchanged heart size and mediastinal contours with thoracic aortic atherosclerosis. No evidence of pulmonary edema. No pneumothorax. IMPRESSION: Worsening left basilar aeration with increasing opacity and possible pleural effusion. Unchanged bibasilar atelectasis. Electronically Signed   By: Jeb Levering M.D.   On: 04/15/2016 01:07   Dg Chest Port 1 View  Result Date: 04/13/2016 CLINICAL DATA:  Left-sided chest pain and cough for 2 days. EXAM: PORTABLE CHEST 1 VIEW COMPARISON:  Chest radiograph 04/28/2015 FINDINGS: Patchy and confluent left lung base opacities suspicious for pneumonia. Streaky right infrahilar opacities likely atelectasis. Mild cardiomegaly with unchanged aortic tortuosity and atherosclerosis. There is mild vascular congestion without overt pulmonary edema. Possible small left pleural effusion. No pneumothorax. Stable osseous structures. IMPRESSION: Patchy and confluent left lung base opacity suspicious for pneumonia. Right infrahilar atelectasis. Electronically Signed   By: Jeb Levering M.D.   On: 04/13/2016 22:47    Assessment/Plan  1 history of pneumonia-this was treated aggressively in the hospital he was discharged on Augmentin which he is completing today-he says his breathing and coughing has improved-there is suspicion of aspiration per hospital workup-- followed by speech therapy here at this point will monitor.  He continues on Robitussin when necessary  Of note a follow-up chest x-ray was done today which shows a continued left lobe infiltrate-however unclear how this compares with previous x-ray since it was a mobile x-ray-again clinically he appears to be doing all right but this will have to be  monitored  #2 history CHF-at this point this appears stable edema appears to be at baseline-respiratory status appears to be relatively stable 2 saturations are satisfactory but  this will have to be monitored.  #3 history of anemia likely with an element of chronic disease-this appears stable hemoglobin is 11.0 today which is baseline with previous values.  #4 history of osteoarthritis continues to be stable and appears he does have Vicodin as needed   #5 history of TIAs-he continues on aspirin this appears to be stable.  #6 history of BPH he does have a chronic indwelling Foley catheter-  at1 point have been on Flomax but this was discontinued secondary to hypotension concerns.  #7 history of restless legs this is been stable on Requip.  #8 history of depression he continues on Zoloft-this appears relatively stable he did state the staff that it is very difficult to grow old but appears to have a reasonably good attitude and outlook.  #9 history renal insufficiency this appears to be stable as well lab today shows a creatinine of 0.87 BUN of 12 which appears to be relatively baseline.  [id number] note greater than 35 minutes spent assessing patient-reviewing his chart-reviewing his labs-and coordinating and formulating a plan of care for numerous diagnoses-of note greater than 50% of time spent coordinating plan of care     -

## 2016-04-29 DIAGNOSIS — J189 Pneumonia, unspecified organism: Secondary | ICD-10-CM | POA: Diagnosis not present

## 2016-05-11 DIAGNOSIS — F329 Major depressive disorder, single episode, unspecified: Secondary | ICD-10-CM | POA: Diagnosis not present

## 2016-05-12 ENCOUNTER — Encounter (HOSPITAL_COMMUNITY)
Admission: RE | Admit: 2016-05-12 | Discharge: 2016-05-12 | Disposition: A | Discharge status: Home / Self Care | Payer: Medicare Other | Source: Skilled Nursing Facility | Attending: Internal Medicine | Admitting: Internal Medicine

## 2016-05-12 DIAGNOSIS — M1990 Unspecified osteoarthritis, unspecified site: Secondary | ICD-10-CM | POA: Insufficient documentation

## 2016-05-13 LAB — VITAMIN D 25 HYDROXY (VIT D DEFICIENCY, FRACTURES): VIT D 25 HYDROXY: 43.8 ng/mL (ref 30.0–100.0)

## 2016-05-18 ENCOUNTER — Other Ambulatory Visit: Payer: Self-pay | Admitting: *Deleted

## 2016-05-18 MED ORDER — HYDROCODONE-ACETAMINOPHEN 5-325 MG PO TABS: ORAL_TABLET | ORAL | 0 refills | Status: DC

## 2016-05-18 NOTE — Telephone Encounter (Signed)
[facility] #[phone] Fax: [phone]   

## 2016-05-24 ENCOUNTER — Encounter: Payer: Self-pay | Admitting: Internal Medicine

## 2016-05-24 ENCOUNTER — Non-Acute Institutional Stay (SKILLED_NURSING_FACILITY): Payer: Medicare Other | Admitting: Internal Medicine

## 2016-05-24 DIAGNOSIS — M1712 Unilateral primary osteoarthritis, left knee: Secondary | ICD-10-CM | POA: Diagnosis not present

## 2016-05-24 DIAGNOSIS — G2581 Restless legs syndrome: Secondary | ICD-10-CM

## 2016-05-24 DIAGNOSIS — F32A Depression, unspecified: Secondary | ICD-10-CM

## 2016-05-24 DIAGNOSIS — F329 Major depressive disorder, single episode, unspecified: Secondary | ICD-10-CM

## 2016-05-24 DIAGNOSIS — D509 Iron deficiency anemia, unspecified: Secondary | ICD-10-CM | POA: Diagnosis not present

## 2016-05-24 DIAGNOSIS — J189 Pneumonia, unspecified organism: Secondary | ICD-10-CM

## 2016-05-24 NOTE — Progress Notes (Addendum)
Location:   [facility] [place] Room Number: 139/W Place of Service:  SNF (31) Provider:  Veleta Miners  [name] Patient  Patient Care Team: [name] Patient as PCP - General (General Practice) Danie Binder, MD as Consulting Physician (Gastroenterology) [name], MD as Consulting Physician [facility])  Extended Emergency Contact Information Primary Emergency Contact: Black,Shirley Address: [address]          [place], [place] [address] [name] of [place] Phone: [phone] Mobile Phone: [phone] Relation: Daughter  Code Status:  DNR Goals of care: Advanced Directive information Advanced Directives 05/24/2016  Does Patient Have a Medical Advance Directive? Yes  Type of Advance Directive Out of facility DNR (pink MOST or yellow form)  Does patient want to make changes to medical advance directive? No - Patient declined  Copy of [place] in Chart? -  Pre-existing out of facility DNR order (yellow form or pink MOST form) -     Chief Complaint  Patient presents with  . Medical Management of Chronic Issues    Routine Visit    HPI:  Pt is a 83 y.o. male seen today for medical management of chronic diseases.  Patient  Is Long term resident of facility and has h/o HTN, CHF, Depression, Arthritis, Anemia, GERD, Chronic Foley Cathter due to Urinary retention.PVD ABI in 2015 refused Aggressive approach.  He was admitted to Hospital in 02/28 with Left lower lobe Pneumonia with Sepsis. He was treated with Augmentin as there was signs of silent aspiration. He is now on Dysphagia 3 Diet. He is doing well in facility. Says his Cough is now resolved. Denies any SOB or Chest pain. He says his appetite is still not that good. His weight has been stable in facility at 157 lbs. His mood is better and he is sleeping well.     Past Medical History:  Diagnosis Date  . Anxiety   . Bilateral foot pain   . CHF (congestive heart failure)  ([place])   . Diverticulitis   . DJD (degenerative joint disease), cervical   . DNR (do not resuscitate)   . Esophageal dysmotility    age related per BPE 04/2015  . Esophageal stricture   . GERD (gastroesophageal reflux disease)   . Gout   . Hiatal hernia   . History of recurrent TIAs   . HTN (hypertension)   . Hyperlipidemia   . Inguinal hernia   . Ischemic heart disease   . Kidney stone   . Osteoarthritis    bilat knees  . Pleural effusion   . Reflux   . Restless leg syndrome   . Right hip pain   . Skin cancer, basal cell   . Urinary retention    Past Surgical History:  Procedure Laterality Date  . ESOPHAGEAL DILATION    . EXPLORATORY LAPAROTOMY W/ BOWEL RESECTION    . FRACTURE SURGERY    . HEMIARTHROPLASTY HIP     Dr. Aline Brochure  . intestines      Allergies  Allergen Reactions  . Celebrex [Celecoxib] Other (See Comments)    Unknown; patient can't remember  . Codeine Other (See Comments)    nausea    Allergies as of 05/24/2016      Reactions   Celebrex [celecoxib] Other (See Comments)   Unknown; patient can't remember   Codeine Other (See Comments)   nausea      Medication List       Accurate as  of 05/24/16  3:26 PM. Always use your most recent med list.          aspirin EC 81 MG tablet Take 81 mg by mouth daily.   BIOFREEZE 4 % Gel Generic drug:  Menthol (Topical Analgesic) Apply small to back as needed for pain   CAMPHOR-EUCALYPTUS-MENTHOL EX Apply 1 application topically 2 (two) times daily as needed.   docusate sodium 100 MG capsule Commonly known as:  COLACE Take 1 capsule (100 mg total) by mouth 2 (two) times daily. For constipation.   guaifenesin 100 MG/5ML syrup Commonly known as:  ROBITUSSIN Take 300 mg by mouth every 6 (six) hours as needed for cough or congestion.   HYDROcodone-acetaminophen 5-325 MG tablet Commonly known as:  NORCO/VICODIN Take one tablet by mouth twice daily and one tablet by mouth every 6 hours as needed for pain.  DNE 3gm of APAP/24hrs   magnesium hydroxide 400 MG/5ML suspension Commonly known as:  MILK OF MAGNESIA Take 30 mLs by mouth daily as needed for mild constipation. If no relief try a Fleets enema, after 2 days.   Menthol 4.8 MG Lozg Use as directed 4.8 mg in the mouth or throat every 4 (four) hours as needed (for sore throat and cough).   omeprazole 20 MG capsule Commonly known as:  PRILOSEC Take 20 mg by mouth daily.   RISA-BID PROBIOTIC PO Take 1 capsule by mouth twice daily   rOPINIRole 2 MG tablet Commonly known as:  REQUIP Take 2 mg by mouth at bedtime.   sertraline 25 MG tablet Commonly known as:  ZOLOFT Take 37.5 mg by mouth daily.   Vitamin D 2000 units Caps Take 1 capsule by mouth daily.       Review of Systems  Review of Systems  Constitutional: Negative for activity change,  chills, diaphoresis, fatigue and fever.  HENT: Negative for mouth sores, postnasal drip, rhinorrhea, sinus pain and sore throat.   Respiratory: Negative for apnea, cough, chest tightness, shortness of breath and wheezing.   Cardiovascular: Negative for chest pain, palpitations and leg swelling.  Gastrointestinal: Negative for abdominal distention, abdominal pain, constipation, diarrhea, nausea and vomiting.  Genitourinary:  Has chronic foley Musculoskeletal: Negative for arthralgias, joint swelling and myalgias.  Skin: Negative for rash.  Neurological: Negative for dizziness, syncope, weakness, light-headedness and numbness.  Psychiatric/Behavioral: Negative for behavioral problems, confusion and sleep disturbance.     Immunization History  Administered Date(s) Administered  . Influenza,inj,Quad PF,36+ Mos 12/07/2014  . Influenza-Unspecified 11/20/2013, 11/13/2015  . PPD Test 03/16/2013  . Pneumococcal-Unspecified 02/14/2009, 11/25/2015   Pertinent  Health Maintenance Due  Topic Date Due  . INFLUENZA VACCINE  09/14/2016  . PNA vac Low Risk Adult (2 of 2 - PCV13) 11/24/2016   No  flowsheet data found. Functional Status Survey:    Vitals:   05/24/16 1525  BP: (!) 109/58  Pulse: 90  Resp: 20  Temp: 97.2 F (36.2 C)  TempSrc: Oral   There is no height or weight on file to calculate BMI. Physical Exam  Constitutional: He is oriented to person, place, and time. He appears well-developed and well-nourished.  HENT:  Head: Normocephalic.  Mouth/Throat: Oropharynx is clear and moist.  Eyes: Pupils are equal, round, and reactive to light.  Neck: Neck supple.  Cardiovascular: Normal rate, regular rhythm and normal heart sounds.   No murmur heard. Pulmonary/Chest: Effort normal and breath sounds normal. No respiratory distress. He has no wheezes. He has no rales.  Abdominal: Soft. Bowel sounds  are normal. He exhibits no distension. There is no tenderness. There is no rebound.  Musculoskeletal: He exhibits no edema.  Neurological: He is alert and oriented to person, place, and time.  Skin: Skin is dry.  Psychiatric: He has a normal mood and affect. His behavior is normal. Thought content normal.    Labs reviewed:  Recent Labs  04/14/16 0544 04/15/16 0422 04/22/16 0700  NA 133* 133* 133*  K 3.8 3.5 4.2  CL 99* 101 99*  CO2 24 23 27   GLUCOSE 121* 113* 98  BUN 16 14 12   CREATININE 1.08 0.93 0.87  CALCIUM 8.3* 8.4* 8.7*    Recent Labs  10/14/15 0750 04/13/16 2220 04/14/16 0544  AST 18 23 17   ALT 13* 19 16*  ALKPHOS 104 80 75  BILITOT 0.4 0.6 0.7  PROT 6.8 7.0 6.8  ALBUMIN 3.4* 2.8* 2.6*    Recent Labs  04/01/16 0700 04/04/16 0700 04/13/16 2220 04/14/16 0544 04/15/16 0422 04/22/16 0700  WBC 4.8 5.6 15.7* 15.2* 12.5* 8.3  NEUTROABS 3.0 3.1 12.5*  --   --   --   HGB 10.9* 10.6* 10.3* 9.9* 9.9* 11.0*  HCT 32.1* 31.6* 31.4* 30.0* 29.8* 34.2*  MCV 83.8 82.7 82.4 82.6 82.1 83.2  PLT 108* 154 358 329 341 277   Lab Results  Component Value Date   TSH 1.323 04/13/2016   Lab Results  Component Value Date   HGBA1C 6.1 (H) 11/12/2014   No  results found for: CHOL, HDL, LDLCALC, LDLDIRECT, TRIG, CHOLHDL  Significant Diagnostic Results in last 30 days:  No results found.  Assessment/Plan  Possible Aspirtaion Pneumonia D/W Speech therapist also. They are not working with  the patient in the facility  But it seems patient is tolerating Dysphagia 3 Diet well. . He has had 2 Chest X Rays to follow his Left lower lobe Pneumonia Last X ray was on 04/22/16 and His Infiltrate is still there. But  at this time due to his Age and [place] Shutters we will just follow his infiltrate with repeat X ray in one month to see  the complete resolution of the infiltrate.    Iron deficiency anemia,  Hgb Stable  Depression On Zoloft   Osteoarthritis  Pain controlled on Hydrocodone  Restless legs Doing well on Requip  Urinary Retention Continue Foley Cathter. Family/ staff Communication:   Labs/tests ordered:  Will order labs to follow Hgb and BMP. D/C Rosabid as not on Antibiotics anymore.

## 2016-05-25 ENCOUNTER — Encounter (HOSPITAL_COMMUNITY)
Admission: AD | Admit: 2016-05-25 | Discharge: 2016-05-25 | Disposition: A | Discharge status: Home / Self Care | Payer: Medicare Other | Source: Skilled Nursing Facility | Attending: Pediatrics | Admitting: Pediatrics

## 2016-05-25 DIAGNOSIS — M1000 Idiopathic gout, unspecified site: Secondary | ICD-10-CM | POA: Insufficient documentation

## 2016-05-25 LAB — CBC
HCT: 34.7 % — ABNORMAL LOW (ref 39.0–52.0)
HEMOGLOBIN: 11.4 g/dL — AB (ref 13.0–17.0)
MCH: 27.7 pg (ref 26.0–34.0)
MCHC: 32.9 g/dL (ref 30.0–36.0)
MCV: 84.4 fL (ref 78.0–100.0)
Platelets: 195 10*3/uL (ref 150–400)
RBC: 4.11 MIL/uL — AB (ref 4.22–5.81)
RDW: 16.3 % — ABNORMAL HIGH (ref 11.5–15.5)
WBC: 7.4 10*3/uL (ref 4.0–10.5)

## 2016-05-25 LAB — BASIC METABOLIC PANEL
ANION GAP: 6 (ref 5–15)
BUN: 11 mg/dL (ref 6–20)
CHLORIDE: 100 mmol/L — AB (ref 101–111)
CO2: 28 mmol/L (ref 22–32)
Calcium: 8.2 mg/dL — ABNORMAL LOW (ref 8.9–10.3)
Creatinine, Ser: 0.98 mg/dL (ref 0.61–1.24)
GFR calc Af Amer: >60 mL/min (ref >60)
GFR calc non Af Amer: >60 mL/min (ref >60)
Glucose, Bld: 94 mg/dL (ref 65–99)
POTASSIUM: 4.3 mmol/L (ref 3.5–5.1)
Sodium: 134 mmol/L — ABNORMAL LOW (ref 135–145)

## 2016-06-07 DIAGNOSIS — Z961 Presence of intraocular lens: Secondary | ICD-10-CM | POA: Diagnosis not present

## 2016-06-07 DIAGNOSIS — D4981 Neoplasm of unspecified behavior of retina and choroid: Secondary | ICD-10-CM | POA: Diagnosis not present

## 2016-06-07 DIAGNOSIS — H04123 Dry eye syndrome of bilateral lacrimal glands: Secondary | ICD-10-CM | POA: Diagnosis not present

## 2016-06-21 DIAGNOSIS — B351 Tinea unguium: Secondary | ICD-10-CM | POA: Diagnosis not present

## 2016-06-21 DIAGNOSIS — I739 Peripheral vascular disease, unspecified: Secondary | ICD-10-CM | POA: Diagnosis not present

## 2016-06-21 DIAGNOSIS — L84 Corns and callosities: Secondary | ICD-10-CM | POA: Diagnosis not present

## 2016-06-22 ENCOUNTER — Encounter (HOSPITAL_COMMUNITY)
Admission: RE | Admit: 2016-06-22 | Discharge: 2016-06-22 | Disposition: A | Discharge status: Home / Self Care | Payer: Medicare Other | Source: Skilled Nursing Facility | Attending: Internal Medicine | Admitting: Internal Medicine

## 2016-06-22 DIAGNOSIS — K4090 Unilateral inguinal hernia, without obstruction or gangrene, not specified as recurrent: Secondary | ICD-10-CM | POA: Diagnosis not present

## 2016-06-22 DIAGNOSIS — I259 Chronic ischemic heart disease, unspecified: Secondary | ICD-10-CM | POA: Insufficient documentation

## 2016-06-22 DIAGNOSIS — R339 Retention of urine, unspecified: Secondary | ICD-10-CM | POA: Insufficient documentation

## 2016-06-22 DIAGNOSIS — M1000 Idiopathic gout, unspecified site: Secondary | ICD-10-CM | POA: Diagnosis not present

## 2016-06-22 LAB — BASIC METABOLIC PANEL
ANION GAP: 8 (ref 5–15)
BUN: 11 mg/dL (ref 6–20)
CALCIUM: 8.8 mg/dL — AB (ref 8.9–10.3)
CO2: 28 mmol/L (ref 22–32)
Chloride: 100 mmol/L — ABNORMAL LOW (ref 101–111)
Creatinine, Ser: 0.89 mg/dL (ref 0.61–1.24)
GFR calc Af Amer: >60 mL/min (ref >60)
Glucose, Bld: 94 mg/dL (ref 65–99)
POTASSIUM: 4.4 mmol/L (ref 3.5–5.1)
SODIUM: 136 mmol/L (ref 135–145)

## 2016-06-23 ENCOUNTER — Other Ambulatory Visit: Payer: Self-pay

## 2016-06-23 MED ORDER — HYDROCODONE-ACETAMINOPHEN 5-325 MG PO TABS
ORAL_TABLET | ORAL | 0 refills | Status: DC
Start: 2016-06-23 — End: 2016-12-07

## 2016-06-23 NOTE — Telephone Encounter (Signed)
RX faxed to [facility] @ [phone]. Phone number [phone]  

## 2016-06-24 ENCOUNTER — Encounter: Payer: Self-pay | Admitting: Internal Medicine

## 2016-06-24 NOTE — Progress Notes (Signed)
Location:   [place] Room Number: 139/W Place of Service:  SNF (31) Provider:  Arlo,Lassen  Patient, [name]  Patient Care Team: Patient, [name], Marga Melnick, MD as Consulting Physician (Gastroenterology) [name], MD as Consulting Physician [facility])  Extended Emergency Contact Information Primary Emergency Contact: Black,Shirley Address: [address]          [place], [place] [address] [name] of [place] Phone: [phone] Mobile Phone: [phone] Relation: Daughter  Code Status:  DNR Goals of care: Advanced Directive information Advanced Directives 06/24/2016  Does Patient Have a Medical Advance Directive? Yes  Type of Advance Directive Out of facility DNR (pink MOST or yellow form)  Does patient want to make changes to medical advance directive? No - Patient declined  Copy of [place] in Chart? -  Pre-existing out of facility DNR order (yellow form or pink MOST form) -     Chief Complaint  Patient presents with  . Medical Management of Chronic Issues    Routine Visit    HPI:  Pt is a 83 y.o. male seen today for medical management of chronic diseases.     Past Medical History:  Diagnosis Date  . Anxiety   . Bilateral foot pain   . CHF (congestive heart failure) ([place])   . Diverticulitis   . DJD (degenerative joint disease), cervical   . DNR (do not resuscitate)   . Esophageal dysmotility    age related per BPE 04/2015  . Esophageal stricture   . GERD (gastroesophageal reflux disease)   . Gout   . Hiatal hernia   . History of recurrent TIAs   . HTN (hypertension)   . Hyperlipidemia   . Inguinal hernia   . Ischemic heart disease   . Kidney stone   . Osteoarthritis    bilat knees  . Pleural effusion   . Reflux   . Restless leg syndrome   . Right hip pain   . Skin cancer, basal cell   . Urinary retention    Past Surgical History:  Procedure  Laterality Date  . ESOPHAGEAL DILATION    . EXPLORATORY LAPAROTOMY W/ BOWEL RESECTION    . FRACTURE SURGERY    . HEMIARTHROPLASTY HIP     Dr. Aline Brochure  . intestines      Allergies  Allergen Reactions  . Celebrex [Celecoxib] Other (See Comments)    Unknown; patient can't remember  . Codeine Other (See Comments)    nausea    Allergies as of 06/24/2016      Reactions   Celebrex [celecoxib] Other (See Comments)   Unknown; patient can't remember   Codeine Other (See Comments)   nausea      Medication List       Accurate as of 06/24/16  9:57 AM. Always use your most recent med list.          aspirin EC 81 MG tablet Take 81 mg by mouth daily.   BIOFREEZE 4 % Gel Generic drug:  Menthol (Topical Analgesic) Apply small to back as needed for pain   CAMPHOR-EUCALYPTUS-MENTHOL EX Apply 1 application topically 2 (two) times daily as needed.   docusate sodium 100 MG capsule Commonly known as:  COLACE Take 1 capsule (100 mg total) by mouth 2 (two) times daily. For constipation.   guaifenesin 100 MG/5ML syrup Commonly known as:  ROBITUSSIN Take 300 mg by mouth every 6 (six)  hours as needed for cough or congestion.   HYDROcodone-acetaminophen 5-325 MG tablet Commonly known as:  NORCO/VICODIN Take one tablet by mouth twice daily and one tablet by mouth every 6 hours as needed for pain. DNE 3gm of APAP/24hrs   magnesium hydroxide 400 MG/5ML suspension Commonly known as:  MILK OF MAGNESIA Take 30 mLs by mouth daily as needed for mild constipation. If no relief try a Fleets enema, after 2 days.   Menthol 4.8 MG Lozg Use as directed 4.8 mg in the mouth or throat every 4 (four) hours as needed (for sore throat and cough).   omeprazole 20 MG capsule Commonly known as:  PRILOSEC Take 20 mg by mouth daily.   Propylene Glycol 0.6 % Soln Apply 1 drop to both eyes twice a day   rOPINIRole 2 MG tablet Commonly known as:  REQUIP Take 2 mg by mouth at bedtime.   sertraline 25 MG  tablet Commonly known as:  ZOLOFT Take 37.5 mg by mouth daily.   Vitamin D 2000 units Caps Take 1 capsule by mouth daily.       Review of Systems  Immunization History  Administered Date(s) Administered  . Influenza,inj,Quad PF,36+ Mos 12/07/2014  . Influenza-Unspecified 11/20/2013, 11/13/2015  . PPD Test 03/16/2013  . Pneumococcal-Unspecified 02/14/2009, 11/25/2015   Pertinent  Health Maintenance Due  Topic Date Due  . INFLUENZA VACCINE  09/14/2016  . PNA vac Low Risk Adult (2 of 2 - PCV13) 11/24/2016   No flowsheet data found. Functional Status Survey:    There were no vitals filed for this visit. There is no height or weight on file to calculate BMI. Physical Exam  Labs reviewed:  Recent Labs  04/22/16 0700 05/25/16 0700 06/22/16 0730  NA 133* 134* 136  K 4.2 4.3 4.4  CL 99* 100* 100*  CO2 27 28 28   GLUCOSE 98 94 94  BUN 12 11 11   CREATININE 0.87 0.98 0.89  CALCIUM 8.7* 8.2* 8.8*    Recent Labs  10/14/15 0750 04/13/16 2220 04/14/16 0544  AST 18 23 17   ALT 13* 19 16*  ALKPHOS 104 80 75  BILITOT 0.4 0.6 0.7  PROT 6.8 7.0 6.8  ALBUMIN 3.4* 2.8* 2.6*    Recent Labs  04/01/16 0700 04/04/16 0700 04/13/16 2220  04/15/16 0422 04/22/16 0700 05/25/16 0700  WBC 4.8 5.6 15.7*  < > 12.5* 8.3 7.4  NEUTROABS 3.0 3.1 12.5*  --   --   --   --   HGB 10.9* 10.6* 10.3*  < > 9.9* 11.0* 11.4*  HCT 32.1* 31.6* 31.4*  < > 29.8* 34.2* 34.7*  MCV 83.8 82.7 82.4  < > 82.1 83.2 84.4  PLT 108* 154 358  < > 341 277 195  < > = values in this interval not displayed. Lab Results  Component Value Date   TSH 1.323 04/13/2016   Lab Results  Component Value Date   HGBA1C 6.1 (H) 11/12/2014   No results found for: CHOL, HDL, LDLCALC, LDLDIRECT, TRIG, CHOLHDL  Significant Diagnostic Results in last 30 days:  No results found.  Assessment/Plan There are no diagnoses linked to this encounter.   Family/ staff Communication:   Labs/tests ordered:

## 2016-06-26 NOTE — Progress Notes (Signed)
This encounter was created in error - please disregard.

## 2016-06-28 ENCOUNTER — Encounter: Payer: Self-pay | Admitting: Internal Medicine

## 2016-06-28 DIAGNOSIS — F329 Major depressive disorder, single episode, unspecified: Secondary | ICD-10-CM

## 2016-06-28 DIAGNOSIS — I509 Heart failure, unspecified: Secondary | ICD-10-CM

## 2016-06-28 DIAGNOSIS — G2581 Restless legs syndrome: Secondary | ICD-10-CM

## 2016-06-28 DIAGNOSIS — F32A Depression, unspecified: Secondary | ICD-10-CM

## 2016-06-28 DIAGNOSIS — M1612 Unilateral primary osteoarthritis, left hip: Secondary | ICD-10-CM

## 2016-06-28 DIAGNOSIS — R1310 Dysphagia, unspecified: Secondary | ICD-10-CM

## 2016-06-29 DIAGNOSIS — Z09 Encounter for follow-up examination after completed treatment for conditions other than malignant neoplasm: Secondary | ICD-10-CM | POA: Diagnosis not present

## 2016-06-29 DIAGNOSIS — R0989 Other specified symptoms and signs involving the circulatory and respiratory systems: Secondary | ICD-10-CM | POA: Diagnosis not present

## 2016-06-29 NOTE — Progress Notes (Signed)
Location:  [place] of Service:  SNF (31) Provider:  Granville Lewis PA-C  Patient, [name]  Patient Care Team: Patient, [name] as PCP - General (General Practice) Patrick Binder, MD as Consulting Physician (Gastroenterology) Patrick Dad, MD as Consulting Physician [name])  Extended Emergency Contact Information Primary Emergency Contact: [name],Patrick Address: [address]          [place], [place] [address] Johnnette [name] of [place] Phone: [phone] Mobile Phone: [phone] Relation: Daughter   Goals of care: Advanced Directive information Advanced Directives 06/29/2016  Does Patient Have a Medical Advance Directive? Yes  Type of Advance Directive Out of facility DNR (pink MOST or yellow form)  Does patient want to make changes to medical advance directive? No - Patient declined  Copy of [place] in Chart? -  Pre-existing out of facility DNR order (yellow form or pink MOST form) -     Chief Complaint  Patient presents with  . Medical Management of Chronic Issues    routine visit  For medical management of chronic medical conditions including hypertension-CHF-osteoarthritis-anemia-urinary retention-peripheral vascular disease-history of dysphagia-osteomyelitis-GERD-depression-breasts leg syndrome  HPI:  Pt is a 83 y.o. male seen today for medical management of chronic diseases. As noted above-despite his numerous diagnoses he continues to be relatively stable and doing fairly well.  Nursing staff feels that he has gotten somewhat more frail --he does not really have any complaints today.  He has had a history of pneumonia and elevated white count but responded well to antibiotics and this appears to be stabilized.  Also history of osteomyelitis lower extremities his feet but this is stabilized as well.  He has had dysphagia this was thought secondary possibly to a Cipro doxycycline combination he received one point  and recommendation is to not use these antibiotics together.  He is on a PPI and it appears to be doing well in this regards. Continues on a dysphagia diet  He does have a history CHF but this is been stable edema appears actually if anything to be somewhat improved today.  He also has a history of osteoarthritis but pain appears to be controlled with his Norco him Biofreeze which she receives topically.  Renal function appears to be stable with creatinine 0.89 BUN of 11 on most recent lab earlier this month.  Continues have a chronic indwelling Foley catheter secondary to history of urinary retention-his Flomax has been discontinued secondary to previous history of hypotension.       Past Medical History:  Diagnosis Date  . Anxiety   . Bilateral foot pain   . CHF (congestive heart failure) ([place])   . Diverticulitis   . DJD (degenerative joint disease), cervical   . DNR (do not resuscitate)   . Esophageal dysmotility    age related per BPE 04/2015  . Esophageal stricture   . GERD (gastroesophageal reflux disease)   . Gout   . Hiatal hernia   . History of recurrent TIAs   . HTN (hypertension)   . Hyperlipidemia   . Inguinal hernia   . Ischemic heart disease   . Kidney stone   . Osteoarthritis    bilat knees  . Pleural effusion   . Reflux   . Restless leg syndrome   . Right hip pain   . Skin cancer, basal cell   . Urinary retention    Past Surgical History:  Procedure Laterality Date  . ESOPHAGEAL DILATION    .  EXPLORATORY LAPAROTOMY W/ BOWEL RESECTION    . FRACTURE SURGERY    . HEMIARTHROPLASTY HIP     Dr. Aline Brochure  . intestines      Allergies  Allergen Reactions  . Celebrex [Celecoxib] Other (See Comments)    Unknown; patient can't remember  . Codeine Other (See Comments)    nausea    Outpatient Encounter Prescriptions as of 06/28/2016  Medication Sig  . aspirin EC 81 MG tablet Take 81 mg by mouth daily.  Marland Kitchen CAMPHOR-EUCALYPTUS-MENTHOL EX Apply 1  application topically 2 (two) times daily as needed.   . Cholecalciferol (VITAMIN D) 2000 UNITS CAPS Take 1 capsule by mouth daily.  Marland Kitchen docusate sodium (COLACE) 100 MG capsule Take 1 capsule (100 mg total) by mouth 2 (two) times daily. For constipation.  Marland Kitchen guaifenesin (ROBITUSSIN) 100 MG/5ML syrup Take 300 mg by mouth every 6 (six) hours as needed for cough or congestion.   Marland Kitchen HYDROcodone-acetaminophen (NORCO/VICODIN) 5-325 MG tablet Take one tablet by mouth twice daily and one tablet by mouth every 6 hours as needed for pain. DNE 3gm of APAP/24hrs  . magnesium hydroxide (MILK OF MAGNESIA) 400 MG/5ML suspension Take 30 mLs by mouth daily as needed for mild constipation. If no relief try a Fleets enema, after 2 days.  . Menthol 4.8 MG LOZG Use as directed 4.8 mg in the mouth or throat every 4 (four) hours as needed (for sore throat and cough).   . Menthol, Topical Analgesic, (BIOFREEZE) 4 % GEL Apply small to back as needed for pain  . omeprazole (PRILOSEC) 20 MG capsule Take 20 mg by mouth daily.  Marland Kitchen Propylene Glycol 0.6 % SOLN Apply 1 drop to both eyes twice a day  . rOPINIRole (REQUIP) 2 MG tablet Take 2 mg by mouth at bedtime.  . sertraline (ZOLOFT) 25 MG tablet Take 37.5 mg by mouth daily.    No facility-administered encounter medications on file as of 06/28/2016.     Review of Systems   In general does not complaining of fever chills weight appears to be relatively stable  Skin does not complain of rashes or itching.  Ears eyes nose mouth and throat does not complaining of visual changes or sore throat.  Respiratory does not complain of shortness of breath or cough  Cardiac does not complaining of chest pain or palpitations. Has fairly minimal lower extremity edema  GI does not complain of abdominal pain nausea vomiting diarrhea constipation appetite is "fair"  GU does not complain of dysuria has an indwelling Foley catheter.  Muscle skeletal has significant weakness and  frailty but does not complaining of joint pain currently  Neurologic is not complaining of dizziness headache or syncope.  Psych continues to be in  good spirits does not complain of anxiety or depression does have a history of depression is on Zoloft  Immunization History  Administered Date(s) Administered  . Influenza,inj,Quad PF,36+ Mos 12/07/2014  . Influenza-Unspecified 11/20/2013, 11/13/2015  . PPD Test 03/16/2013  . Pneumococcal-Unspecified 02/14/2009, 11/25/2015   Pertinent  Health Maintenance Due  Topic Date Due  . INFLUENZA VACCINE  09/14/2016  . PNA vac Low Risk Adult (2 of 2 - PCV13) 11/24/2016   No flowsheet data found. Functional Status Survey:    Temperature is 97.0 pulse 68 respirations 20 blood pressure 136/66  Weight is 165.6 this appears again of about 3 pounds over the past month  Physical Exam In general this is a frail elderly male who does not appear to be in  any distress sitting comfortably in his wheelchair  Skin is warm and dry. I do not note any rashes-previous skin cancer removal site on ears looks unremarkable  Chest has shallow air entry could not appreciate any overt congestion there is no labored breathing.  Heart is regular rate and rhythm without murmur gallop or rub he has someminimal  lower extremity edema   Abdomen soft nontender positive bowel sounds. Has a abdominal hernia which appears unchanged  GU has an indwelling Foley catheter. Draining amber colored urine  Muscle skeletal general frailty but is able to move all extremities 4 at baseline. His feet looks unremarkable very minimal edema I do not see any sign of infection or open areas  Neurologic is grossly intact to speech is clear no lateralizing findings.  Psych continues to be alert and oriented pleasant and appropriate Labs reviewed:  Recent Labs  04/22/16 0700 05/25/16 0700 06/22/16 0730  NA 133* 134* 136  K 4.2 4.3 4.4  CL 99* 100* 100*  CO2 27 28 28     GLUCOSE 98 94 94  BUN 12 11 11   CREATININE 0.87 0.98 0.89  CALCIUM 8.7* 8.2* 8.8*    Recent Labs  10/14/15 0750 04/13/16 2220 04/14/16 0544  AST 18 23 17   ALT 13* 19 16*  ALKPHOS 104 80 75  BILITOT 0.4 0.6 0.7  PROT 6.8 7.0 6.8  ALBUMIN 3.4* 2.8* 2.6*    Recent Labs  04/01/16 0700 04/04/16 0700 04/13/16 2220  04/15/16 0422 04/22/16 0700 05/25/16 0700  WBC 4.8 5.6 15.7*  < > 12.5* 8.3 7.4  NEUTROABS 3.0 3.1 12.5*  --   --   --   --   HGB 10.9* 10.6* 10.3*  < > 9.9* 11.0* 11.4*  HCT 32.1* 31.6* 31.4*  < > 29.8* 34.2* 34.7*  MCV 83.8 82.7 82.4  < > 82.1 83.2 84.4  PLT 108* 154 358  < > 341 277 195  < > = values in this interval not displayed. Lab Results  Component Value Date   TSH 1.323 04/13/2016   Lab Results  Component Value Date   HGBA1C 6.1 (H) 11/12/2014   No results found for: CHOL, HDL, LDLCALC, LDLDIRECT, TRIG, CHOLHDL  Significant Diagnostic Results in last 30 days:  No results found.  Assessment/Plan  1 history of hypertension this appears stable he does have some history of hypotension but this appears to have stabilized a see occasional systolics below 505 this appears to be quite rare manual reading today was reassuring.  #2 history CHF this is been stable I do not really see any increased edema has gained a small amount of weight I suspect this is partially gain since his hospitalization appetite remains somewhat fair is not complaining of any chest pain or shortness of breath.  #3 history of osteoarthritis this appears controlled with the Norco as well as the Biofreeze topical.  #4 anemia with a likely component of chronic disease this is stable with a hemoglobin 11.4 on lab done in April.  #5 history of dysphagia and this has stabilized again recommendation is to avoid Cipro and doxycycline combination-she is on a PPI-weight appears to be relatively stable.  #6 history of urinary retention status post Foley catheter this appears to be stable  again Flomax was discontinued because of hypotension concerns.  #7 history of peripheral vascular disease he has really refused any aggressive workup secondary to comorbidities and advanced age but this appears to be stabilized he is at risk for further  development of wounds lower extremities but this has stabilized.  #8-history of depression he is on Zoloft and this appears to be quite stable continues to be in good spirits this depressive episode appear to be of quite short duration.  #9 history of TIAs he continues on aspirin neurologically appears to be intact.  #10 history of restless leg syndrome he is on Requip which appears to be helping.  #11 history of pneumonia respiratory issues again he is prone to this with a risk of aspiration but this has been stable now for some time-continues on a dysphagia diet.  #13 history of osteomyelitis again this has stabilized his feet actually looked the best they have been some time today.  #14 history of basal cell carcinoma he is followed by dermatology has had actinic keratosis recently removed back in February  [id number]-of note greater than 40 minutes spent assessing patient-discussing patient's status with him-reviewing his chart-reviewing his labs-and coordinating formulating a plan of care for numerous diagnoses-of note greater than 50% of time spent coordinating plan of care

## 2016-06-29 NOTE — Progress Notes (Deleted)
This encounter was created in error - please disregard.

## 2016-07-11 ENCOUNTER — Non-Acute Institutional Stay (SKILLED_NURSING_FACILITY): Payer: Medicare Other | Admitting: Internal Medicine

## 2016-07-11 ENCOUNTER — Encounter: Payer: Self-pay | Admitting: Internal Medicine

## 2016-07-11 DIAGNOSIS — L03032 Cellulitis of left toe: Secondary | ICD-10-CM | POA: Diagnosis not present

## 2016-07-11 DIAGNOSIS — N179 Acute kidney failure, unspecified: Secondary | ICD-10-CM | POA: Diagnosis not present

## 2016-07-11 NOTE — Progress Notes (Signed)
This is an acute visit.  Level care skilled.  Facility is [facility].  Chief complaint-acute visit secondary to erythema left foot.  History of present illness.  Patient is a 83 year old male with a complex medical history including history of lower extremity cellulitis-gout-osteoarthritis-urinary retention chronic indwelling Foley catheter-CHF and anemia. As well as dysphagia   -he has had a period is stability recently has been reurrent lower extremity discomfort erythema.  At times this is thought to be gout related and he is given a short course of colchicine.  Apparently today nursing's noted some increased erythema of his left distal foot centered more around his left third toe.  He is also complaining of some tenderness here  Vital signs are stable he is afebrile he denies any fever or chills otherwise has no complaints today        Past Medical History  Diagnosis Date  . Anxiety   . HTN (hypertension)   . Reflux   . Skin cancer, basal cell   . Diverticulitis   . Hyperlipidemia   . Inguinal hernia   . Ischemic heart disease   . Hiatal hernia   . Kidney stone   . Esophageal stricture   . History of recurrent TIAs   . Restless leg syndrome   . GERD (gastroesophageal reflux disease)   . Pleural effusion   . CHF (congestive heart failure) ([place])   . Urinary retention   . DNR (do not resuscitate)   . DJD (degenerative joint disease), cervical   . Osteoarthritis     bilat knees  . Gout   . Right hip pain   . Bilateral foot pain   . Esophageal dysmotility     age related per BPE 04/2015        Past Surgical History  Procedure Laterality Date  . Intestines    . Exploratory laparotomy w/ bowel resection    . Esophageal dilation    . Fracture surgery    . Hemiarthroplasty hip           Allergies  Allergen Reactions  . Celebrex [Celecoxib] Other (See Comments)    unknown  . Codeine Other (See Comments)      unknown          Current Outpatient Prescriptions on File Prior to Visit  Medication Sig Dispense Refill  .       .      .      .      .       .      .      .       .      .      .      .      .        .      .      .       No current facility-administered medications on file prior to visit.   Medications.  Aspirin 81 mg daily.  Biofreeze when necessary.  Vitamin D 2000 units daily.  Colace 100 mg twice a day.  Vicodin 5-3 25 mg twice a day.  Milk of magnesia when necessary.  Prilosec 20 mg daily.  Requip 2 mg daily at bedtime.  Systane eyedrops 0.6% twice a day to both eyes.  Tussin 100 mg-5 mL-15 mL every 6 hours when necessary.  Vicks VapoRub twice a day when necessary  Zoloft 37.5 mg by mouth  daily.  .      Review of Systems   In general  No  complaints of fever chills  Skin again has noted some erythema of his distal left foot and left middle toe does not complain of any rashes or itching does have a history of skin carcinomas .  Respiratory does not complain  shortness breath or cough.  Cardiac no chest pain mild lower extremity edema.  GI has had dysphagia issues this appears to be improved   Muscle skeletal is not complaining of joint pain at this time Other than discomfort of his left middle toe and some tenderness of the distal foot  Neurologic is not complaining of any dizziness or headache.  Psych does not complain of depression or anxiety this has been stable for some time       Immunization History  Administered Date(s) Administered  . Influenza,inj,Quad PF,36+ Mos 12/07/2014  . Influenza-Unspecified 11/20/2013  . Pneumococcal-Unspecified 02/14/2009       Pertinent  Health Maintenance Due  Topic Date Due  . PNA vac Low Risk Adult (2 of 2 - PCV13) 02/14/2010  . INFLUENZA VACCINE  09/15/2015                                   Temperature is  97.8 pulse 93 respirations 20 blood pressure 104/62   Physical Exam      In general is a pleasantly male in no distress sitting comfortably in his wheelchair.  His skin is warm and dry he has numerous solar induced changes skin cancer removal sites that appear to be stable.- I do note erythema covering the top of his left distal foot approximately one third of the way up the foot-there is also erythema centered on his left third toe   Oropharynx clear mucous membranes moist.     Chest is clear with shallow air entry could not really appreciate any overt congestion no labored breathing. Heart sounds are somewhat distant Heart is regular rate and rhythm without murmur gallop or rub he has minimal edema lower extremities bilaterally.--  Abdomen is protuberant nontender well-healed surgical scar in right-sided hernia which appears unchanged from previous exams he has positive bowel sounds.  GU he has an indwelling Foley catheter draining amber colored urine  Musculoskeletal has general frailty but moves extremities at baseline-again he does have erythema of his left distal foot as noted above as well as erythema mostof his left third toe there is some tenderness to palpation of the toe there is no drainage bleeding or open wound that I can see  His other toes left and right foot appeared to be unremarkable without increased edema or erythema or tenderness to touch  Neurologic is grossly intact to speech is clear no lateralizing findings.  Psych he continues to be largely alert and oriented pleasant cooperative which is his baseline.    Labs reviewed:  May ninth 2018.  Sodium 136 potassium 4.4 BUN 11 creatinine 0.89.  WBC 7.4 hemoglobin 11.4 platelets 195    Recent Labs (within last 365 days)   Recent Labs  11/03/14 0700  04/28/15 1655 04/29/15 0448 06/10/15 06/10/15 0715  NA  --   < > 137 139 137 137  K  --   < > 4.8 3.9 4.3 4.3  CL  --   < >  100* 104  --  101  CO2  --   < >  28 28  --  28  GLUCOSE  --   < > 118* 88  --  92  BUN  --   < > 19 17 21  21*  CREATININE  --   < > 1.27* 1.18 1.1 1.11  CALCIUM  --   < > 8.8* 8.4*  --  8.9  MG 2.0  --   --   --   --   --   < > = values in this interval not displayed.    Recent Labs (within last 365 days)   Recent Labs  04/12/15 1535 04/28/15 1655 04/29/15 0448  AST 20 24 18   ALT 13* 15* 12*  ALKPHOS 105 94 77  BILITOT 0.7 0.7 0.7  PROT 8.0 7.2 6.3*  ALBUMIN 3.8 3.6 3.0*      Recent Labs (within last 365 days)   Assessment and plan.  #1-history of suspected cellulitis left foot-he has had a period stability here but this appears to be cellulitis reoccurring-he did respond well to Bactrim will order Bactrim DS twice a day for 7 days-as well as a probiotic twice a day for 10 days.  In the past --s he was treated with doxycycline Cipro combination but he did not do well with this with significant dysphagia issues-- again he appears to be doing better with the Bactrim his renal function also will be watched will order a BMP for later in the week to keep an eye on this since he is on Bactrim   [phone]

## 2016-07-14 ENCOUNTER — Non-Acute Institutional Stay (SKILLED_NURSING_FACILITY): Payer: Medicare Other | Admitting: Internal Medicine

## 2016-07-14 ENCOUNTER — Encounter (HOSPITAL_COMMUNITY)
Admission: RE | Admit: 2016-07-14 | Discharge: 2016-07-14 | Disposition: A | Discharge status: Home / Self Care | Payer: Medicare Other | Source: Skilled Nursing Facility | Attending: Internal Medicine | Admitting: Internal Medicine

## 2016-07-14 ENCOUNTER — Encounter: Payer: Self-pay | Admitting: Internal Medicine

## 2016-07-14 DIAGNOSIS — N289 Disorder of kidney and ureter, unspecified: Secondary | ICD-10-CM | POA: Diagnosis not present

## 2016-07-14 DIAGNOSIS — K4090 Unilateral inguinal hernia, without obstruction or gangrene, not specified as recurrent: Secondary | ICD-10-CM | POA: Insufficient documentation

## 2016-07-14 DIAGNOSIS — M1000 Idiopathic gout, unspecified site: Secondary | ICD-10-CM | POA: Insufficient documentation

## 2016-07-14 DIAGNOSIS — R21 Rash and other nonspecific skin eruption: Secondary | ICD-10-CM

## 2016-07-14 DIAGNOSIS — R339 Retention of urine, unspecified: Secondary | ICD-10-CM | POA: Insufficient documentation

## 2016-07-14 DIAGNOSIS — I259 Chronic ischemic heart disease, unspecified: Secondary | ICD-10-CM | POA: Insufficient documentation

## 2016-07-14 DIAGNOSIS — L0390 Cellulitis, unspecified: Secondary | ICD-10-CM | POA: Diagnosis not present

## 2016-07-14 LAB — BASIC METABOLIC PANEL
ANION GAP: 8 (ref 5–15)
BUN: 12 mg/dL (ref 6–20)
CO2: 27 mmol/L (ref 22–32)
CREATININE: 1.27 mg/dL — AB (ref 0.61–1.24)
Calcium: 8.6 mg/dL — ABNORMAL LOW (ref 8.9–10.3)
Chloride: 99 mmol/L — ABNORMAL LOW (ref 101–111)
GFR calc non Af Amer: 45 mL/min — ABNORMAL LOW (ref >60)
GFR, EST AFRICAN AMERICAN: 52 mL/min — AB (ref >60)
GLUCOSE: 114 mg/dL — AB (ref 65–99)
POTASSIUM: 4 mmol/L (ref 3.5–5.1)
Sodium: 134 mmol/L — ABNORMAL LOW (ref 135–145)

## 2016-07-14 LAB — CBC WITH DIFFERENTIAL/PLATELET
BASOS ABS: 0 10*3/uL (ref 0.0–0.1)
BASOS PCT: 0 %
EOS PCT: 6 %
Eosinophils Absolute: 0.4 10*3/uL (ref 0.0–0.7)
HCT: 34.8 % — ABNORMAL LOW (ref 39.0–52.0)
Hemoglobin: 11.2 g/dL — ABNORMAL LOW (ref 13.0–17.0)
Lymphocytes Relative: 27 %
Lymphs Abs: 1.7 10*3/uL (ref 0.7–4.0)
MCH: 27.8 pg (ref 26.0–34.0)
MCHC: 32.2 g/dL (ref 30.0–36.0)
MCV: 86.4 fL (ref 78.0–100.0)
MONO ABS: 0.6 10*3/uL (ref 0.1–1.0)
Monocytes Relative: 9 %
NEUTROS ABS: 3.7 10*3/uL (ref 1.7–7.7)
Neutrophils Relative %: 58 %
PLATELETS: 233 10*3/uL (ref 150–400)
RBC: 4.03 MIL/uL — AB (ref 4.22–5.81)
RDW: 16.5 % — AB (ref 11.5–15.5)
WBC: 6.5 10*3/uL (ref 4.0–10.5)

## 2016-07-14 NOTE — Progress Notes (Signed)
Location:   [place] Room Number: 139/W Place of Service:  SNF (31) Provider:  [place] Lewis  Patient, [name]  Patient Care Team: Patient, [name] as PCP - General (General Practice) Danie Binder, MD as Consulting Physician (Gastroenterology) [name], MD as Consulting Physician [facility])  Extended Emergency Contact Information Primary Emergency Contact: Black,Shirley Address: [address]          [place], [place] [address] [name] of [place] Phone: [phone] Mobile Phone: [phone] Relation: Daughter  Code Status:  DNR Goals of care: Advanced Directive information Advanced Directives 07/14/2016  Does Patient Have a Medical Advance Directive? Yes  Type of Advance Directive Out of facility DNR (pink MOST or yellow form)  Does patient want to make changes to medical advance directive? No - Patient declined  Copy of [place] in Chart? -  Pre-existing out of facility DNR order (yellow form or pink MOST form) -     Chief Complaint  Patient presents with  . Acute Visit    Foot Infection   I follow-up of suspected left foot cellulitis as well as mild renal insufficiency.   HPI:  Pt is a 83 y.o. male seen today for an acute visit for follow-up of left foot cellulitis as well as mild renal insufficiency.   Patient is a 83 year old male with a complex medical history including history of lower extremity cellulitis-gout-osteoarthritis-urinary retention chronic indwelling Foley catheter-CHF and anemia. As well as dysphagia   I saw him earlier this week for some increased erythema of his distal left foot-he does have some history of foot cellulitis and has responded to Bactrim in the past.  He is not a good candidate for Cipro doxycycline secondary to dysphagia issues.  He was started on Bactrim earlier this week and we have been following his renal function--and it does show that is creatinine is going  up somewhat it is 1.27 was 0.89--3 weeks ago  It appears previously at one point his creatinine also had gone up with use of Bactrim and his Bactrim dose was reduced to every other day and this stabilized.  In regards to the cellulitis this appears to have improved per nursing inpatient he denies any fever or chills he is afebrile    . --  Past Medical History:  Diagnosis Date  . Anxiety   . Bilateral foot pain   . CHF (congestive heart failure) ([place])   . Diverticulitis   . DJD (degenerative joint disease), cervical   . DNR (do not resuscitate)   . Esophageal dysmotility    age related per BPE 04/2015  . Esophageal stricture   . GERD (gastroesophageal reflux disease)   . Gout   . Hiatal hernia   . History of recurrent TIAs   . HTN (hypertension)   . Hyperlipidemia   . Inguinal hernia   . Ischemic heart disease   . Kidney stone   . Osteoarthritis    bilat knees  . Pleural effusion   . Reflux   . Restless leg syndrome   . Right hip pain   . Skin cancer, basal cell   . Urinary retention    Past Surgical History:  Procedure Laterality Date  . ESOPHAGEAL DILATION    . EXPLORATORY LAPAROTOMY W/ BOWEL RESECTION    . FRACTURE SURGERY    . HEMIARTHROPLASTY HIP     Dr. Aline Brochure  . intestines      Allergies  Allergen  Reactions  . Celebrex [Celecoxib] Other (See Comments)    Unknown; patient can't remember  . Codeine Other (See Comments)    nausea    Outpatient Encounter Prescriptions as of 07/14/2016  Medication Sig  . aspirin EC 81 MG tablet Take 81 mg by mouth daily.  Marland Kitchen CAMPHOR-EUCALYPTUS-MENTHOL EX Apply 1 application topically 2 (two) times daily as needed.   . Cholecalciferol (VITAMIN D) 2000 UNITS CAPS Take 1 capsule by mouth daily.  Marland Kitchen docusate sodium (COLACE) 100 MG capsule Take 1 capsule (100 mg total) by mouth 2 (two) times daily. For constipation.  Marland Kitchen guaifenesin (ROBITUSSIN) 100 MG/5ML syrup Take 300 mg by mouth every 6 (six) hours as needed for cough or  congestion.   Marland Kitchen HYDROcodone-acetaminophen (NORCO/VICODIN) 5-325 MG tablet Take one tablet by mouth twice daily and one tablet by mouth every 6 hours as needed for pain. DNE 3gm of APAP/24hrs  . magnesium hydroxide (MILK OF MAGNESIA) 400 MG/5ML suspension Take 30 mLs by mouth daily as needed for mild constipation. If no relief try a Fleets enema, after 2 days.  . Menthol 4.8 MG LOZG Use as directed 4.8 mg in the mouth or throat every 4 (four) hours as needed (for sore throat and cough).   . Menthol, Topical Analgesic, (BIOFREEZE) 4 % GEL Apply small to back as needed for pain  . omeprazole (PRILOSEC) 20 MG capsule Take 20 mg by mouth daily.  . Probiotic Product (RISA-BID PROBIOTIC) TABS Take 1 tablet by mouth twice a day until 07/21/2016  . Propylene Glycol 0.6 % SOLN Apply 1 drop to both eyes twice a day  . rOPINIRole (REQUIP) 2 MG tablet Take 2 mg by mouth at bedtime.  . sertraline (ZOLOFT) 25 MG tablet Take 37.5 mg by mouth daily.   Marland Kitchen sulfamethoxazole-trimethoprim (BACTRIM DS,SEPTRA DS) 800-160 MG tablet Take 1 tablet by mouth 2 (two) times daily.   No facility-administered encounter medications on file as of 07/14/2016.     Review of Systems In general  No  complaints of fever chills  Skin again has noted some erythema of his distal left foot and left middle toe does not complain of any rashes or itching does have a history of skin carcinomas--erythema of the foot appears improved per patient Feels he may have a slight rash of his forearms bilaterally .  Respiratory does not complain  shortness breath or cough.  Cardiac no chest pain mild lower extremity edema.  GI has had dysphagia issues this appears to be improved   Muscle skeletal is not complaining of joint pain at this time discomfort of the left middle toe he complained of earlier has essentially resolved  Neurologic is not complaining of any dizziness or headache.  Psych does not complain of depression or anxiety this  has been stable for some time  Immunization History  Administered Date(s) Administered  . Influenza,inj,Quad PF,36+ Mos 12/07/2014  . Influenza-Unspecified 11/20/2013, 11/13/2015  . PPD Test 03/16/2013  . Pneumococcal-Unspecified 02/14/2009, 11/25/2015   Pertinent  Health Maintenance Due  Topic Date Due  . INFLUENZA VACCINE  09/14/2016  . PNA vac Low Risk Adult (2 of 2 - PCV13) 11/24/2016   No flowsheet data found. Functional Status Survey:    He is afebrile pulse of 80 respirations 20 blood pressure 97/56  Physical Exam In general is a pleasantly male in no distress sitting comfortably in his wheelchair.  His skin is warm and dry he has numerous solar induced changes skin cancer removal sites that appear  to be stable.- I do note erythema covering the top of his left distal foot but this appears to be less inflamed appearing than when I saw appears to be resolving covers approximately about a third of his distal foot but is less erythematous middle toe which had been quite erythematous previously appears to be less inflamed appearing as well fact there is significantly less erythema   In regards to his forearms there is some slight erythema of his dorsal aspects of the arms but this does not appear to be grossly changed from baseline-his skin continues to be quite fragile I do not see any rash of the abdomen chest thorax or anything indicating a generalized rash   Oropharynx clear mucous membranes moist.     Chest is clear with shallow air entry could not really appreciate any overt congestion no labored breathing. Heart sounds are somewhat distant Heart is regular rate and rhythm without murmur gallop or rub he has minimal edema lower extremities bilaterally.--  Abdomen is protuberant nontender well-healed surgical scar in right-sided hernia which appears unchanged from previous exams he has positive bowel sounds.  GU he has an indwelling Foley catheter draining  amber colored urine  Musculoskeletal has general frailty but moves extremities at baseline-again he does have erythema of his left distal foot as noted above as well as erythema mostof his left third toe  But this is less tender and erythematous then was t originally  His other toes left and right foot appeared to be unremarkable without increased edema or erythema or tenderness to touch  Neurologic is grossly intact to speech is clear no lateralizing findings.  Psych he continues to be largely alert and oriented pleasant cooperative which is his baseline. Labs reviewed:  Recent Labs  05/25/16 0700 06/22/16 0730 07/14/16 0740  NA 134* 136 134*  K 4.3 4.4 4.0  CL 100* 100* 99*  CO2 28 28 27   GLUCOSE 94 94 114*  BUN 11 11 12   CREATININE 0.98 0.89 1.27*  CALCIUM 8.2* 8.8* 8.6*    Recent Labs  10/14/15 0750 04/13/16 2220 04/14/16 0544  AST 18 23 17   ALT 13* 19 16*  ALKPHOS 104 80 75  BILITOT 0.4 0.6 0.7  PROT 6.8 7.0 6.8  ALBUMIN 3.4* 2.8* 2.6*    Recent Labs  04/04/16 0700 04/13/16 2220  04/22/16 0700 05/25/16 0700 07/14/16 0740  WBC 5.6 15.7*  < > 8.3 7.4 6.5  NEUTROABS 3.1 12.5*  --   --   --  3.7  HGB 10.6* 10.3*  < > 11.0* 11.4* 11.2*  HCT 31.6* 31.4*  < > 34.2* 34.7* 34.8*  MCV 82.7 82.4  < > 83.2 84.4 86.4  PLT 154 358  < > 277 195 233  < > = values in this interval not displayed. Lab Results  Component Value Date   TSH 1.323 04/13/2016   Lab Results  Component Value Date   HGBA1C 6.1 (H) 11/12/2014   No results found for: CHOL, HDL, LDLCALC, LDLDIRECT, TRIG, CHOLHDL  Significant Diagnostic Results in last 30 days:  No results found.  Assessment/Plan  . #1-left foot cellulitis appears to be resolving with the Bactrim-however his creatinine is going up slightly will reduce Bactrim to every other day dosing to complete his course--will update a metabolic panel on Monday, June 4.  #2 question renal insufficiency as noted above will decrease  Bactrim dosing to every other day and update a BMP next week.  #3-question arm rash at this  point difficult to tell this is truly a rash or just a manifestation of his quite fragile skin will monitor for now--.  [place], [place], Nellie

## 2016-07-18 ENCOUNTER — Encounter (HOSPITAL_COMMUNITY)
Admission: RE | Admit: 2016-07-18 | Discharge: 2016-07-18 | Disposition: A | Discharge status: Home / Self Care | Payer: Medicare Other | Source: Skilled Nursing Facility | Attending: Internal Medicine | Admitting: Internal Medicine

## 2016-07-18 DIAGNOSIS — M1000 Idiopathic gout, unspecified site: Secondary | ICD-10-CM | POA: Insufficient documentation

## 2016-07-18 DIAGNOSIS — K4090 Unilateral inguinal hernia, without obstruction or gangrene, not specified as recurrent: Secondary | ICD-10-CM | POA: Insufficient documentation

## 2016-07-18 DIAGNOSIS — R339 Retention of urine, unspecified: Secondary | ICD-10-CM | POA: Diagnosis not present

## 2016-07-18 DIAGNOSIS — I259 Chronic ischemic heart disease, unspecified: Secondary | ICD-10-CM | POA: Insufficient documentation

## 2016-07-18 LAB — BASIC METABOLIC PANEL
Anion gap: 7 (ref 5–15)
BUN: 12 mg/dL (ref 6–20)
CALCIUM: 8.5 mg/dL — AB (ref 8.9–10.3)
CO2: 28 mmol/L (ref 22–32)
Chloride: 100 mmol/L — ABNORMAL LOW (ref 101–111)
Creatinine, Ser: 1.29 mg/dL — ABNORMAL HIGH (ref 0.61–1.24)
GFR, EST AFRICAN AMERICAN: 51 mL/min — AB (ref >60)
GFR, EST NON AFRICAN AMERICAN: 44 mL/min — AB (ref >60)
Glucose, Bld: 90 mg/dL (ref 65–99)
Potassium: 4.7 mmol/L (ref 3.5–5.1)
Sodium: 135 mmol/L (ref 135–145)

## 2016-08-01 ENCOUNTER — Encounter: Payer: Self-pay | Admitting: Internal Medicine

## 2016-08-01 ENCOUNTER — Non-Acute Institutional Stay (SKILLED_NURSING_FACILITY): Payer: Medicare Other | Admitting: Internal Medicine

## 2016-08-01 DIAGNOSIS — R1310 Dysphagia, unspecified: Secondary | ICD-10-CM | POA: Diagnosis not present

## 2016-08-01 DIAGNOSIS — J189 Pneumonia, unspecified organism: Secondary | ICD-10-CM | POA: Diagnosis not present

## 2016-08-01 DIAGNOSIS — L0390 Cellulitis, unspecified: Secondary | ICD-10-CM | POA: Diagnosis not present

## 2016-08-01 DIAGNOSIS — D509 Iron deficiency anemia, unspecified: Secondary | ICD-10-CM | POA: Diagnosis not present

## 2016-08-01 NOTE — Progress Notes (Signed)
Location:   [place] Room Number: 139/D Place of Service:  SNF (31) Provider:  Halen Antenucci,Kanyah Matsushima  Patient, [name]  Patient Care Team: Patient, [name], Marga Melnick, MD as Consulting Physician (Gastroenterology) [name], MD as Consulting Physician [facility])  Extended Emergency Contact Information Primary Emergency Contact: Black,Shirley Address: [address]          [place], [place] [address] [name] of [place] Phone: [phone] Mobile Phone: [phone] Relation: Daughter  Code Status:  DNR Goals of care: Advanced Directive information Advanced Directives 08/01/2016  Does Patient Have a Medical Advance Directive? Yes  Type of Advance Directive Out of facility DNR (pink MOST or yellow form)  Does patient want to make changes to medical advance directive? No - Patient declined  Copy of [place] in Chart? -  Pre-existing out of facility DNR order (yellow form or pink MOST form) -     Chief Complaint  Patient presents with  . Medical Management of Chronic Issues    Routine Visit    HPI:  Pt is a 83 y.o. male seen today for medical management of chronic diseases.    Patient  Is Long term resident of facility and has h/o HTN, CHF, Depression, Arthritis, Anemia, GERD, Chronic Foley Cathter due to Urinary retention.PVD ABI in 2015 refused Aggressive approach. Patient is doing well in facility. He was treated for cellulitis of great toe with bactrim. He says it is all better now. Patient has h/o PVD . He is not candidate for any aggressive treatment. He also had Left lower lobe Pneumonia in 02/18 but he is doing well on dysphagic diet. Not having any issues with cough. His weight is up to 166 lbs. He says he is eating well. No Nursing issues.  Past Medical History:  Diagnosis Date  . Anxiety   . Bilateral foot pain   . CHF (congestive heart failure) ([place])   .  Diverticulitis   . DJD (degenerative joint disease), cervical   . DNR (do not resuscitate)   . Esophageal dysmotility    age related per BPE 04/2015  . Esophageal stricture   . GERD (gastroesophageal reflux disease)   . Gout   . Hiatal hernia   . History of recurrent TIAs   . HTN (hypertension)   . Hyperlipidemia   . Inguinal hernia   . Ischemic heart disease   . Kidney stone   . Osteoarthritis    bilat knees  . Pleural effusion   . Reflux   . Restless leg syndrome   . Right hip pain   . Skin cancer, basal cell   . Urinary retention    Past Surgical History:  Procedure Laterality Date  . ESOPHAGEAL DILATION    . EXPLORATORY LAPAROTOMY W/ BOWEL RESECTION    . FRACTURE SURGERY    . HEMIARTHROPLASTY HIP     Dr. Aline Brochure  . intestines      Allergies  Allergen Reactions  . Celebrex [Celecoxib] Other (See Comments)    Unknown; patient can't remember  . Codeine Other (See Comments)    nausea    Outpatient Encounter Prescriptions as of 08/01/2016  Medication Sig  . aspirin EC 81 MG tablet Take 81 mg by mouth daily.  Marland Kitchen CAMPHOR-EUCALYPTUS-MENTHOL EX Apply 1 application topically 2 (two) times daily as needed.   . Cholecalciferol (VITAMIN D) 2000 UNITS CAPS Take 1 capsule by mouth daily.  Marland Kitchen  docusate sodium (COLACE) 100 MG capsule Take 1 capsule (100 mg total) by mouth 2 (two) times daily. For constipation.  Marland Kitchen guaifenesin (ROBITUSSIN) 100 MG/5ML syrup Take 300 mg by mouth every 6 (six) hours as needed for cough or congestion.   Marland Kitchen HYDROcodone-acetaminophen (NORCO/VICODIN) 5-325 MG tablet Take one tablet by mouth twice daily and one tablet by mouth every 6 hours as needed for pain. DNE 3gm of APAP/24hrs  . magnesium hydroxide (MILK OF MAGNESIA) 400 MG/5ML suspension Take 30 mLs by mouth daily as needed for mild constipation. If no relief try a Fleets enema, after 2 days.  . Menthol 4.8 MG LOZG Use as directed 4.8 mg in the mouth or throat every 4 (four) hours as needed (for sore  throat and cough).   . Menthol, Topical Analgesic, (BIOFREEZE) 4 % GEL Apply small to back as needed for pain  . omeprazole (PRILOSEC) 20 MG capsule Take 20 mg by mouth daily.  Marland Kitchen Propylene Glycol 0.6 % SOLN Apply 1 drop to both eyes twice a day  . rOPINIRole (REQUIP) 2 MG tablet Take 2 mg by mouth at bedtime.  . sertraline (ZOLOFT) 25 MG tablet Take 37.5 mg by mouth daily.   . [DISCONTINUED] Probiotic Product (RISA-BID PROBIOTIC) TABS Take 1 tablet by mouth twice a day until 07/21/2016  . [DISCONTINUED] sulfamethoxazole-trimethoprim (BACTRIM DS,SEPTRA DS) 800-160 MG tablet Take 1 tablet by mouth 2 (two) times daily.   No facility-administered encounter medications on file as of 08/01/2016.     Review of Systems  Review of Systems  Constitutional: Negative for activity change, appetite change, chills, diaphoresis, fatigue and fever.  HENT: Negative for mouth sores, postnasal drip, rhinorrhea, sinus pain and sore throat.   Respiratory: Negative for apnea, cough, chest tightness, shortness of breath and wheezing.   Cardiovascular: Negative for chest pain, palpitations and leg swelling.  Gastrointestinal: Negative for abdominal distention, abdominal pain, constipation, diarrhea, nausea and vomiting.  Genitourinary: Negative for dysuria and frequency.  Musculoskeletal: positive f for arthralgias, Negative for  joint swelling and myalgias.  Skin: Negative for rash.  Neurological: Negative for dizziness, syncope, weakness, light-headedness and numbness.  Psychiatric/Behavioral: Negative for behavioral problems, confusion and sleep disturbance.     Immunization History  Administered Date(s) Administered  . Influenza,inj,Quad PF,36+ Mos 12/07/2014  . Influenza-Unspecified 11/20/2013, 11/13/2015  . PPD Test 03/16/2013  . Pneumococcal-Unspecified 02/14/2009, 11/25/2015   Pertinent  Health Maintenance Due  Topic Date Due  . INFLUENZA VACCINE  09/14/2016  . PNA vac Low Risk Adult (2 of 2 -  PCV13) 11/24/2016   No flowsheet data found. Functional Status Survey:    Vitals:   07/31/16 1001  BP: 121/68  Pulse: 81  Resp: 20  Temp: 98.2 F (36.8 C)  TempSrc: Oral  SpO2: 100%  Weight: 166 lb 12.8 oz (75.7 kg)  Height: 6' (1.829 m)   Body mass index is 22.62 kg/m. Physical Exam  Constitutional: He is oriented to person, place, and time. He appears well-developed and well-nourished.  HENT:  Head: Normocephalic.  Mouth/Throat: Oropharynx is clear and moist.  Eyes: Pupils are equal, round, and reactive to light.  Neck: Neck supple.  Cardiovascular: Normal rate, regular rhythm and normal heart sounds.   No murmur heard. Pulmonary/Chest: Effort normal and breath sounds normal. No respiratory distress. He has no wheezes. He has no rales.  Abdominal: Soft. Bowel sounds are normal. He exhibits no distension. There is no tenderness. There is no rebound.  Musculoskeletal: He exhibits no edema.  Neurological:  He is alert and oriented to person, place, and time.  Skin: Skin is dry.  Psychiatric: He has a normal mood and affect. His behavior is normal. Thought content normal.    Labs reviewed:  Recent Labs  06/22/16 0730 07/14/16 0740 07/18/16 0734  NA 136 134* 135  K 4.4 4.0 4.7  CL 100* 99* 100*  CO2 28 27 28   GLUCOSE 94 114* 90  BUN 11 12 12   CREATININE 0.89 1.27* 1.29*  CALCIUM 8.8* 8.6* 8.5*    Recent Labs  10/14/15 0750 04/13/16 2220 04/14/16 0544  AST 18 23 17   ALT 13* 19 16*  ALKPHOS 104 80 75  BILITOT 0.4 0.6 0.7  PROT 6.8 7.0 6.8  ALBUMIN 3.4* 2.8* 2.6*    Recent Labs  04/04/16 0700 04/13/16 2220  04/22/16 0700 05/25/16 0700 07/14/16 0740  WBC 5.6 15.7*  < > 8.3 7.4 6.5  NEUTROABS 3.1 12.5*  --   --   --  3.7  HGB 10.6* 10.3*  < > 11.0* 11.4* 11.2*  HCT 31.6* 31.4*  < > 34.2* 34.7* 34.8*  MCV 82.7 82.4  < > 83.2 84.4 86.4  PLT 154 358  < > 277 195 233  < > = values in this interval not displayed. Lab Results  Component Value Date    TSH 1.323 04/13/2016   Lab Results  Component Value Date   HGBA1C 6.1 (H) 11/12/2014   No results found for: CHOL, HDL, LDLCALC, LDLDIRECT, TRIG, CHOLHDL  Significant Diagnostic Results in last 30 days:  No results found.  Assessment/Plan  Possible Aspirtaion Pneumonia Patient is tolerating Dysphagia 3 Diet well. . He has had 2 Chest X Rays to follow his Left lower lobe Pneumonia Last X ray was on 04/22/16 and His Infiltrate is still there. As planned will repeat his Chest Xray to see complete resolution of his infiltrate.  Recent weight Gain Will continue to follow patient says he is eating well. Iron deficiency anemia,  Hgb Stable  Depression On Zoloft   Osteoarthritis  Pain controlled on Hydrocodone  Restless legs Doing well on Requip  Urinary Retention Continue Foley Cathter.  Family/ staff Communication:   Labs/tests ordered:  Chest Xray   Total time spent in this patient care encounter was 25_ minutes; greater than 50% of the visit spent counseling patient and coordinating care for problems addressed at this encounter.

## 2016-08-02 DIAGNOSIS — R0989 Other specified symptoms and signs involving the circulatory and respiratory systems: Secondary | ICD-10-CM | POA: Diagnosis not present

## 2016-08-02 DIAGNOSIS — Z09 Encounter for follow-up examination after completed treatment for conditions other than malignant neoplasm: Secondary | ICD-10-CM | POA: Diagnosis not present

## 2016-08-16 ENCOUNTER — Non-Acute Institutional Stay (SKILLED_NURSING_FACILITY): Payer: Medicare Other | Admitting: Internal Medicine

## 2016-08-16 ENCOUNTER — Encounter: Payer: Self-pay | Admitting: Internal Medicine

## 2016-08-16 DIAGNOSIS — R102 Pelvic and perineal pain: Secondary | ICD-10-CM

## 2016-08-16 DIAGNOSIS — N289 Disorder of kidney and ureter, unspecified: Secondary | ICD-10-CM

## 2016-08-16 NOTE — Progress Notes (Signed)
Location:   [facility] Room Number: 139/P Place of Service:  SNF (31) Provider:  Granville Lewis  Patient, [name]  Patient Care Team: Patient, [name] as PCP - General (General Practice) Danie Binder, MD as Consulting Physician (Gastroenterology) [name], MD as Consulting Physician [facility])  Extended Emergency Contact Information Primary Emergency Contact: Black,Shirley Address: [address]          [place], [place] [address] [name] of [place] Phone: [phone] Mobile Phone: [phone] Relation: Daughter  Code Status:  DNR Goals of care: Advanced Directive information Advanced Directives 08/16/2016  Does Patient Have a Medical Advance Directive? Yes  Type of Advance Directive Out of facility DNR (pink MOST or yellow form)  Does patient want to make changes to medical advance directive? No - Patient declined  Copy of [place] in Chart? -  Pre-existing out of facility DNR order (yellow form or pink MOST form) -     Chief Complaint  Patient presents with  . Acute Visit  Secondary to perineal pain  HPI:  Pt is a 83 y.o. male seen today for an acute visit for complaints of intermittent perineal pain-apparently this is associated more with movement-pain appears to be more behind his scrotal area in anterior to his buttocks area-he says this is not persistent appears to be more so with movement-he does not report any recent falls or trauma and nursing staff does not report this either.   Patient Is Long term resident of facility and has h/o HTN, CHF, Depression, Arthritis, Anemia, GERD, Chronic Foley Cathter due to Urinary retention.PVD ABI in 2015 refused Aggressive approach.  He is not really complaining of dysuria again does have the chronic indwelling Foley catheter and apparently has tolerated this well and is draining amber colored urine today.  At one point he been on Flomax but this was discontinued  secondary to concerns of hypotension-blood pressure today is 100/64 manually this appears relatively baseline he does not complaining any increased dizziness or syncope   Past Medical History:  Diagnosis Date  . Anxiety   . Bilateral foot pain   . CHF (congestive heart failure) ([place])   . Diverticulitis   . DJD (degenerative joint disease), cervical   . DNR (do not resuscitate)   . Esophageal dysmotility    age related per BPE 04/2015  . Esophageal stricture   . GERD (gastroesophageal reflux disease)   . Gout   . Hiatal hernia   . History of recurrent TIAs   . HTN (hypertension)   . Hyperlipidemia   . Inguinal hernia   . Ischemic heart disease   . Kidney stone   . Osteoarthritis    bilat knees  . Pleural effusion   . Reflux   . Restless leg syndrome   . Right hip pain   . Skin cancer, basal cell   . Urinary retention    Past Surgical History:  Procedure Laterality Date  . ESOPHAGEAL DILATION    . EXPLORATORY LAPAROTOMY W/ BOWEL RESECTION    . FRACTURE SURGERY    . HEMIARTHROPLASTY HIP     Dr. Aline Brochure  . intestines      Allergies  Allergen Reactions  . Celebrex [Celecoxib] Other (See Comments)    Unknown; patient can't remember  . Codeine Other (See Comments)    nausea    Outpatient Encounter Prescriptions as of 08/16/2016  Medication Sig  . aspirin EC 81 MG tablet Take  81 mg by mouth daily.  Marland Kitchen CAMPHOR-EUCALYPTUS-MENTHOL EX Apply 1 application topically 2 (two) times daily as needed.   . Cholecalciferol (VITAMIN D) 2000 UNITS CAPS Take 1 capsule by mouth daily.  Marland Kitchen docusate sodium (COLACE) 100 MG capsule Take 1 capsule (100 mg total) by mouth 2 (two) times daily. For constipation.  Marland Kitchen guaifenesin (ROBITUSSIN) 100 MG/5ML syrup Take 300 mg by mouth every 6 (six) hours as needed for cough or congestion.   Marland Kitchen HYDROcodone-acetaminophen (NORCO/VICODIN) 5-325 MG tablet Take one tablet by mouth twice daily and one tablet by mouth every 6 hours as needed for pain. DNE 3gm of  APAP/24hrs  . magnesium hydroxide (MILK OF MAGNESIA) 400 MG/5ML suspension Take 30 mLs by mouth daily as needed for mild constipation. If no relief try a Fleets enema, after 2 days.  . Menthol 4.8 MG LOZG Use as directed 4.8 mg in the mouth or throat every 4 (four) hours as needed (for sore throat and cough).   . Menthol, Topical Analgesic, (BIOFREEZE) 4 % GEL Apply small to back as needed for pain  . omeprazole (PRILOSEC) 20 MG capsule Take 20 mg by mouth daily.  Marland Kitchen Propylene Glycol 0.6 % SOLN Apply 1 drop to both eyes twice a day  . rOPINIRole (REQUIP) 2 MG tablet Take 2 mg by mouth at bedtime.  . sertraline (ZOLOFT) 25 MG tablet Take 37.5 mg by mouth daily.    No facility-administered encounter medications on file as of 08/16/2016.     Review of Systems   In general does not complain of any fever or chills.  Skin does not complain of rashes or itching does have history of numerous skin cancers there have been followed by dermatology.  Head ears eyes nose mouth and throat does not complain at this time of dysphagia or sore throat or visual changes.  Respiratory is not planning shortness breath or cough.  Cardiac denies chest pain has mild lower extremity edema this appears stabilized.  GU again does complain of some perineal pain although this is not really dysuria has a chronic indwelling Foley catheter.  Muscle skeletal again does complain of some what appears to be perineal pain Is not really complaining of joint pain.  Neurologic is not complaining of dizziness headache or syncope.  Psych he is to be pleasant does not complain of overt anxiety or depression is on Zoloft with what appears to be beneficial effect  Immunization History  Administered Date(s) Administered  . Influenza,inj,Quad PF,36+ Mos 12/07/2014  . Influenza-Unspecified 11/20/2013, 11/13/2015  . PPD Test 03/16/2013  . Pneumococcal-Unspecified 02/14/2009, 11/25/2015   Pertinent  Health Maintenance Due  Topic  Date Due  . INFLUENZA VACCINE  09/14/2016  . PNA vac Low Risk Adult (2 of 2 - PCV13) 11/24/2016   No flowsheet data found. Functional Status Survey:    Vitals:   08/16/16 1201  BP: 95/60  Pulse: (!) 102  Resp: 18  Temp: 98 F (36.7 C)  TempSrc: Oral  SpO2: 98%  Of note manual blood pressure was 100/64 this appears relatively baseline with systolics ranging from the high 90s to low 100s  Physical Exam In general is a pleasantly male in no distress sitting comfortably in his wheelchair.  His skin is warm and dry he has numerous solar induced changes skin cancer removal sites that appear to be stable.-      Oropharynx clear mucous membranes moist.     Chest is clear with shallow air entry could not really appreciate  any overt congestion no labored breathing. Heart sounds are somewhat distant  Heart is regular rate and rhythm without murmur gallop or rub he has minimaledema lower extremities bilaterally.--  Abdomen is protuberant nontender well-healed surgical scar in right-sided hernia which appears unchanged from previous exams he has positive bowel sounds.  GU he has an indwelling Foley catheter draining amber colored urine  Musculoskeletal has general frailty but moves extremities at baseline He does have significant lower extremity stiffness-this was quite apparent when nursing assistant did transfer to bed.  I did look at his perineal area I did not note any deformities scrotal area was unremarkable-I did not note any drainage or bleeding from his penis Buttocks exam was unremarkable-physical exam was fairly benign     Neurologic is grossly intact to speech is clear no lateralizing findings.  Psych he continues to be largely alert and oriented pleasant cooperative which is his baseline. Labs reviewed:  Recent Labs  06/22/16 0730 07/14/16 0740 07/18/16 0734  NA 136 134* 135  K 4.4 4.0 4.7  CL 100* 99* 100*  CO2 28 27 28   GLUCOSE 94  114* 90  BUN 11 12 12   CREATININE 0.89 1.27* 1.29*  CALCIUM 8.8* 8.6* 8.5*    Recent Labs  10/14/15 0750 04/13/16 2220 04/14/16 0544  AST 18 23 17   ALT 13* 19 16*  ALKPHOS 104 80 75  BILITOT 0.4 0.6 0.7  PROT 6.8 7.0 6.8  ALBUMIN 3.4* 2.8* 2.6*    Recent Labs  04/04/16 0700 04/13/16 2220  04/22/16 0700 05/25/16 0700 07/14/16 0740  WBC 5.6 15.7*  < > 8.3 7.4 6.5  NEUTROABS 3.1 12.5*  --   --   --  3.7  HGB 10.6* 10.3*  < > 11.0* 11.4* 11.2*  HCT 31.6* 31.4*  < > 34.2* 34.7* 34.8*  MCV 82.7 82.4  < > 83.2 84.4 86.4  PLT 154 358  < > 277 195 233  < > = values in this interval not displayed. Lab Results  Component Value Date   TSH 1.323 04/13/2016   Lab Results  Component Value Date   HGBA1C 6.1 (H) 11/12/2014   No results found for: CHOL, HDL, LDLCALC, LDLDIRECT, TRIG, CHOLHDL  Significant Diagnostic Results in last 30 days:  No results found.  Assessment/Plan  #1 perineal pain-apparently this is intermittent one would suspect possibly occasional muscle discomfort with his general stiffness and difficulty with transferring which I noted today-at this point will monitor he does have Vicodin as needed for pain as well as routinely he appears to be tolerating this-if there are any changes certainly notify provider.  His catheter appears to be functioning he is not really complaining of dysuria this does not appear to be a UTI presentation but will have to be watched.  Number 2-question renal insufficiency -I do note creatinine 1.29 on lab done earlier in June-this is slightly above his baseline Will update this for monitoring her nursing apparently he is eating and drinking fairly well.  [id number]

## 2016-08-18 ENCOUNTER — Other Ambulatory Visit (HOSPITAL_COMMUNITY)
Admission: RE | Admit: 2016-08-18 | Discharge: 2016-08-18 | Disposition: A | Discharge status: Home / Self Care | Payer: Medicare Other | Source: Skilled Nursing Facility | Attending: Internal Medicine | Admitting: Internal Medicine

## 2016-08-18 DIAGNOSIS — I259 Chronic ischemic heart disease, unspecified: Secondary | ICD-10-CM | POA: Insufficient documentation

## 2016-08-18 DIAGNOSIS — J181 Lobar pneumonia, unspecified organism: Secondary | ICD-10-CM | POA: Diagnosis not present

## 2016-08-18 DIAGNOSIS — M1000 Idiopathic gout, unspecified site: Secondary | ICD-10-CM | POA: Insufficient documentation

## 2016-08-18 DIAGNOSIS — R339 Retention of urine, unspecified: Secondary | ICD-10-CM | POA: Insufficient documentation

## 2016-08-18 DIAGNOSIS — K4090 Unilateral inguinal hernia, without obstruction or gangrene, not specified as recurrent: Secondary | ICD-10-CM | POA: Insufficient documentation

## 2016-08-18 LAB — BASIC METABOLIC PANEL
ANION GAP: 6 (ref 5–15)
BUN: 14 mg/dL (ref 6–20)
CHLORIDE: 100 mmol/L — AB (ref 101–111)
CO2: 29 mmol/L (ref 22–32)
CREATININE: 0.99 mg/dL (ref 0.61–1.24)
Calcium: 8.7 mg/dL — ABNORMAL LOW (ref 8.9–10.3)
GFR calc non Af Amer: >60 mL/min (ref >60)
Glucose, Bld: 91 mg/dL (ref 65–99)
Potassium: 4.2 mmol/L (ref 3.5–5.1)
SODIUM: 135 mmol/L (ref 135–145)

## 2016-08-29 ENCOUNTER — Encounter: Payer: Self-pay | Admitting: Internal Medicine

## 2016-08-29 ENCOUNTER — Non-Acute Institutional Stay (SKILLED_NURSING_FACILITY): Payer: Medicare Other | Admitting: Internal Medicine

## 2016-08-29 DIAGNOSIS — I10 Essential (primary) hypertension: Secondary | ICD-10-CM

## 2016-08-29 DIAGNOSIS — F329 Major depressive disorder, single episode, unspecified: Secondary | ICD-10-CM

## 2016-08-29 DIAGNOSIS — N289 Disorder of kidney and ureter, unspecified: Secondary | ICD-10-CM | POA: Diagnosis not present

## 2016-08-29 DIAGNOSIS — D509 Iron deficiency anemia, unspecified: Secondary | ICD-10-CM | POA: Diagnosis not present

## 2016-08-29 DIAGNOSIS — I509 Heart failure, unspecified: Secondary | ICD-10-CM

## 2016-08-29 DIAGNOSIS — F32A Depression, unspecified: Secondary | ICD-10-CM

## 2016-08-29 DIAGNOSIS — M1990 Unspecified osteoarthritis, unspecified site: Secondary | ICD-10-CM

## 2016-08-29 DIAGNOSIS — J189 Pneumonia, unspecified organism: Secondary | ICD-10-CM | POA: Diagnosis not present

## 2016-08-29 NOTE — Progress Notes (Signed)
Location:   [facility] Room Number: 139/D Place of Service:  SNF (31) Provider:  Emerald Shor,Zykerria Tanton  Patient, [name]  Patient Care Team: Patient, [name], Marga Melnick, MD as Consulting Physician (Gastroenterology) [name], MD as Consulting Physician [facility])  Extended Emergency Contact Information Primary Emergency Contact: Black,Shirley Address: [address]          [place], [place] [address] [name] of [place] Phone: [phone] Mobile Phone: [phone] Relation: Daughter  Code Status:  DNR Goals of care: Advanced Directive information Advanced Directives 08/29/2016  Does Patient Have a Medical Advance Directive? Yes  Type of Advance Directive Out of facility DNR (pink MOST or yellow form)  Does patient want to make changes to medical advance directive? No - Patient declined  Copy of [place] in Chart? -  Pre-existing out of facility DNR order (yellow form or pink MOST form) -     Chief Complaint  Patient presents with  . Medical Management of Chronic Issues    Routine Visit   For medical management of chronic medical issues including history aspiration pneumonia-anemia-osteoarthritis-restless legs-history of BPH with urinary retention-CHF-history of left leg cellulitis-history of peripheral vascular disease-history of hypertension HPI:  Pt is a 83 y.o. male seen today for medical management of chronic diseases.  As noted above-he appears to be having a. If stability saw him recently for some perineal discomfort he says this is essentially resolved one would think this was most likely a muscle strain which resolved.  He also at one point was hospitalized for pneumonia but he is currently asymptomatic does not complaining any cough or shortness of breath.  He also has a history of cellulitis of the left foot that has responded in the past to Bactrim this appears stable  today as well as.  Of note one point he had been treated with Cipro and doxycycline, but he experienced significant dysphagia so recommendation is to try to avoid this in the future.  He does have a history of peripheral vascular disease not a candidate for aggressive treatment secondary to comorbidities and advanced age.  His weight appears to be stable at 168 pounds in fact he was eating gr crackers when I was in the room today.  Regards osteoarthritis se is on Vicodin as well as Biofreeze when necessary and this appears to be helping.  He also has a history of anemia most likely chronic disease this is been stable with most recent hemoglobin of 11.2 back in late May.  In regards to urinary retention at one point he had been on Flomax with a history of BPH but he had hypotension issues this was discontinued as blood pressure appears to be doing better since then-he does have chronic indwelling Foley catheter and this appears to be relatively stable.  He also has a history of lower extremity edema and CHF at this is been stable as well his edema is quite minimal today is not complaining again of any shortness of breath or increased cough.  Currently is sitting in his wheelchair comfortably watching TV eating eatin crackers vital signs are stable but appears blood pressure manually it was 104/62  He does have a history of numerous skin cancers which have been treated by dermatology-I do note he has a darkened area on the left side of his nose today it is quite small he says this is actually a scab from him rubbing  his nose but this will have to be watched   Past Medical History:  Diagnosis Date  . Anxiety   . Bilateral foot pain   . CHF (congestive heart failure) ([place])   . Diverticulitis   . DJD (degenerative joint disease), cervical   . DNR (do not resuscitate)   . Esophageal dysmotility    age related per BPE 04/2015  . Esophageal stricture   . GERD (gastroesophageal reflux disease)    . Gout   . Hiatal hernia   . History of recurrent TIAs   . HTN (hypertension)   . Hyperlipidemia   . Inguinal hernia   . Ischemic heart disease   . Kidney stone   . Osteoarthritis    bilat knees  . Pleural effusion   . Reflux   . Restless leg syndrome   . Right hip pain   . Skin cancer, basal cell   . Urinary retention    Past Surgical History:  Procedure Laterality Date  . ESOPHAGEAL DILATION    . EXPLORATORY LAPAROTOMY W/ BOWEL RESECTION    . FRACTURE SURGERY    . HEMIARTHROPLASTY HIP     Dr. Aline Brochure  . intestines      Allergies  Allergen Reactions  . Celebrex [Celecoxib] Other (See Comments)    Unknown; patient can't remember  . Codeine Other (See Comments)    nausea    Outpatient Encounter Prescriptions as of 08/29/2016  Medication Sig  . aspirin EC 81 MG tablet Take 81 mg by mouth daily.  Marland Kitchen CAMPHOR-EUCALYPTUS-MENTHOL EX Apply 1 application topically 2 (two) times daily as needed.  . Cholecalciferol (VITAMIN D) 2000 UNITS CAPS Take 1 capsule by mouth daily.  Marland Kitchen docusate sodium (COLACE) 100 MG capsule Take 1 capsule (100 mg total) by mouth 2 (two) times daily. For constipation.  Marland Kitchen guaifenesin (ROBITUSSIN) 100 MG/5ML syrup Take 300 mg by mouth every 6 (six) hours as needed for cough or congestion.   Marland Kitchen HYDROcodone-acetaminophen (NORCO/VICODIN) 5-325 MG tablet Take one tablet by mouth twice daily and one tablet by mouth every 6 hours as needed for pain. DNE 3gm of APAP/24hrs  . magnesium hydroxide (MILK OF MAGNESIA) 400 MG/5ML suspension Take 30 mLs by mouth daily as needed for mild constipation. If no relief try a Fleets enema, after 2 days.  . Menthol 4.8 MG LOZG Take 1 by mouth every 4 hours prn  . Menthol, Topical Analgesic, (BIOFREEZE) 4 % GEL Apply small to back as needed for pain  . omeprazole (PRILOSEC) 20 MG capsule Take 20 mg by mouth daily.  Marland Kitchen Propylene Glycol 0.6 % SOLN Apply 1 drop to both eyes twice a day  . rOPINIRole (REQUIP) 2 MG tablet Take 2 mg by  mouth at bedtime.  . sertraline (ZOLOFT) 25 MG tablet Take 37.5 mg by mouth daily.   . [DISCONTINUED] CAMPHOR-EUCALYPTUS-MENTHOL EX Apply 1 application topically 2 (two) times daily as needed.   . [DISCONTINUED] Menthol 4.8 MG LOZG Use as directed 4.8 mg in the mouth or throat every 4 (four) hours as needed (for sore throat and cough).    No facility-administered encounter medications on file as of 08/29/2016.      Review of Systems In general does not complain of any fever or chills. Says he feels well weight appears to be stable  Skin does not complain of rashes or itching does have history of numerous skin cancers there have been followed by dermatology.--Has a small darkened area on the left side of his  nose he says is a scab  Head ears eyes nose mouth and throat does not complain at this time of dysphagia or sore throat or visual changes.  Respiratory is not planning shortness breath or cough.  Cardiac denies chest pain has mild lower extremity edema this appears stabilized.  GU a Has a chronic indwelling Foley catheter does not really complain of any suprapubic pain--or dysuria  M Muscle skeletal currently is not complaining of joint pain does have significant osteoarthritis-says the perineal pain has resolved  Neurologic is not complaining of dizziness headache or syncope.  Psych he is to be pleasant does not complain of overt anxiety or depression is on Zoloft with what appears to be beneficial effect Immunization History  Administered Date(s) Administered  . Influenza,inj,Quad PF,36+ Mos 12/07/2014  . Influenza-Unspecified 11/20/2013, 11/13/2015  . PPD Test 03/16/2013  . Pneumococcal-Unspecified 02/14/2009, 11/25/2015   Pertinent  Health Maintenance Due  Topic Date Due  . INFLUENZA VACCINE  09/14/2016  . PNA vac Low Risk Adult (2 of 2 - PCV13) 11/24/2016   No flowsheet data found. Functional Status Survey:    Vitals:   08/28/16 1354  BP: 108/68  Pulse:  60  Resp: 18  Temp: 98.4 F (36.9 C)  TempSrc: Oral  Weight: 168 lb (76.2 kg)  Height: 6' (1.829 m)   Body mass index is 22.78 kg/m. Physical Exam In general is a pleasantly male in no distress sitting comfortably in his wheelchair.  His skin is warm and dry he has numerous solar induced changes skin cancer removal sites that appear to be stable. I do note a small pinpoint darkened area on the left side of his nose appears to be a scab-      Oropharynx clear mucous membranes moist.     Chest is clear with shallow air entry could not really appreciate any overt congestion no labored breathing.   Heart is regular rate and rhythm without murmur gallop or rub he has minimaledema lower extremities bilaterally.--Heart sounds are somewhat distant  Abdomen is protuberant nontender well-healed surgical scar in right-sided hernia which appears unchanged from previous exams he has positive bowel sounds.  GU he has an indwelling Foley catheter draining amber colored urine  Musculoskeletal has general frailty but moves extremities at baseline He does have significant lower extremity stiffness-which is baseline-his feet appear to be fairly unremarkable with minimal edema I do not see any evidence of cellulitis or sores  I      Neurologic is grossly intact to speech is clear no lateralizing findings.  Psych he continues to be largely alert and oriented pleasant cooperative which is his baseline. Labs reviewed: Labs reviewed:  Recent Labs  07/14/16 0740 07/18/16 0734 08/18/16 0600  NA 134* 135 135  K 4.0 4.7 4.2  CL 99* 100* 100*  CO2 27 28 29   GLUCOSE 114* 90 91  BUN 12 12 14   CREATININE 1.27* 1.29* 0.99  CALCIUM 8.6* 8.5* 8.7*    Recent Labs  10/14/15 0750 04/13/16 2220 04/14/16 0544  AST 18 23 17   ALT 13* 19 16*  ALKPHOS 104 80 75  BILITOT 0.4 0.6 0.7  PROT 6.8 7.0 6.8  ALBUMIN 3.4* 2.8* 2.6*    Recent Labs  04/04/16 0700  04/13/16 2220  04/22/16 0700 05/25/16 0700 07/14/16 0740  WBC 5.6 15.7*  < > 8.3 7.4 6.5  NEUTROABS 3.1 12.5*  --   --   --  3.7  HGB 10.6* 10.3*  < > 11.0* 11.4*  11.2*  HCT 31.6* 31.4*  < > 34.2* 34.7* 34.8*  MCV 82.7 82.4  < > 83.2 84.4 86.4  PLT 154 358  < > 277 195 233  < > = values in this interval not displayed. Lab Results  Component Value Date   TSH 1.323 04/13/2016   Lab Results  Component Value Date   HGBA1C 6.1 (H) 11/12/2014   No results found for: CHOL, HDL, LDLCALC, LDLDIRECT, TRIG, CHOLHDL  Significant Diagnostic Results in last 30 days:  No results found.  Assessment/Plan    #1 history of osteoarthritis-this appears stable at one point complained of perineal pain which is resolved he does have Vicodin routinely twice daily as well as when necessary every 6 hours also continues on Biofreeze as needed.  #2 history of aspiration pneumonia this is stabilized he is not really complaining of any cough congestion fever or chills he's actually gained a little bit away eating apparently a bit better continue to monitor.  #3 history of CHF-edema is quite minimal I think the weight gain is probably appetite related he is not symptomatic of any shortness of breath at this point call for indication of worsening CHF-at one point a been on Lasix this has been discontinued nonetheless appears to be stable.  #4-history of anemia likely of chronic disease hemoglobin has been stable at 11.2 on lab in late May we will update this.  #5 history of restless legs C is on Requip appears to be helping.  #6 history of urinary retention-chronic indwelling Foley catheter this appears to be stable-one point had been on Flomax this was discontinued because of hypotension that was symptomatic   #7 history of hypertension currently on no medications nonetheless appears to be stable manual blood pressure today 104/62 I previous readings 108/68-110/72-once in a while systolic in the 29B but this  does not appear to be common he has not been symptomatic of any hypertension recently.  #8-history of dysphagia this apparently is aggravated by the Cipro doxycycline combination again this appears to have stabilized with recommendation nocturia try Cipro and doxycycline together.  #9 history of TIAs this is been stable he is on aspirin neurologically appears to be intact.  #10-history of depression this is quite stable on Zoloft in the past said time she was thought to have some depressive symptoms, insulin indicating depression but this has essentially resolved on the Zoloft.  11 history of vitamin D deficiency vitamin D level was normal at 43.8 on lab done in late March he is on supplementation.  #11-history of skin cancer he does have a darkened area on the left side of his nose however patient states he was scratching his nose and that this is a scab this will have to be watched for changes however with his history-at this point. His quite minimal but again will have to be monitored.  Of note Will update a CBC to keep an eye on his anemia-also a BMP-will obtain this next week so we can see how his renal function is going appears to have stabilized most recent lab.  Also the area on his nose will need to be watched  [id number]-of note greater than 35 minutes spent assessing patient-reviewing his chart-reviewing his labs-and coordinating and formulating a plan of care for numerous diagnoses-of note greater than 50% of time spent coordinating

## 2016-09-05 ENCOUNTER — Encounter (HOSPITAL_COMMUNITY)
Admission: RE | Admit: 2016-09-05 | Discharge: 2016-09-05 | Disposition: A | Discharge status: Home / Self Care | Payer: Medicare Other | Source: Skilled Nursing Facility | Attending: Internal Medicine | Admitting: Internal Medicine

## 2016-09-05 DIAGNOSIS — D6489 Other specified anemias: Secondary | ICD-10-CM | POA: Diagnosis not present

## 2016-09-05 DIAGNOSIS — J181 Lobar pneumonia, unspecified organism: Secondary | ICD-10-CM | POA: Diagnosis not present

## 2016-09-05 DIAGNOSIS — M1000 Idiopathic gout, unspecified site: Secondary | ICD-10-CM | POA: Insufficient documentation

## 2016-09-05 DIAGNOSIS — I259 Chronic ischemic heart disease, unspecified: Secondary | ICD-10-CM | POA: Insufficient documentation

## 2016-09-05 DIAGNOSIS — R339 Retention of urine, unspecified: Secondary | ICD-10-CM | POA: Diagnosis not present

## 2016-09-05 DIAGNOSIS — K4090 Unilateral inguinal hernia, without obstruction or gangrene, not specified as recurrent: Secondary | ICD-10-CM | POA: Diagnosis not present

## 2016-09-05 LAB — CBC
HCT: 31.9 % — ABNORMAL LOW (ref 39.0–52.0)
HEMOGLOBIN: 10.5 g/dL — AB (ref 13.0–17.0)
MCH: 28.8 pg (ref 26.0–34.0)
MCHC: 32.9 g/dL (ref 30.0–36.0)
MCV: 87.4 fL (ref 78.0–100.0)
Platelets: 146 10*3/uL — ABNORMAL LOW (ref 150–400)
RBC: 3.65 MIL/uL — AB (ref 4.22–5.81)
RDW: 14.5 % (ref 11.5–15.5)
WBC: 6.9 10*3/uL (ref 4.0–10.5)

## 2016-09-05 LAB — BASIC METABOLIC PANEL
Anion gap: 7 (ref 5–15)
BUN: 14 mg/dL (ref 6–20)
CHLORIDE: 102 mmol/L (ref 101–111)
CO2: 27 mmol/L (ref 22–32)
Calcium: 8.7 mg/dL — ABNORMAL LOW (ref 8.9–10.3)
Creatinine, Ser: 0.97 mg/dL (ref 0.61–1.24)
GFR calc Af Amer: >60 mL/min (ref >60)
GFR calc non Af Amer: >60 mL/min (ref >60)
Glucose, Bld: 94 mg/dL (ref 65–99)
POTASSIUM: 4.1 mmol/L (ref 3.5–5.1)
SODIUM: 136 mmol/L (ref 135–145)

## 2016-09-30 ENCOUNTER — Non-Acute Institutional Stay (SKILLED_NURSING_FACILITY): Payer: Medicare Other | Admitting: Internal Medicine

## 2016-09-30 ENCOUNTER — Encounter: Payer: Self-pay | Admitting: Internal Medicine

## 2016-09-30 DIAGNOSIS — R059 Cough, unspecified: Secondary | ICD-10-CM

## 2016-09-30 DIAGNOSIS — Z8701 Personal history of pneumonia (recurrent): Secondary | ICD-10-CM

## 2016-09-30 DIAGNOSIS — R05 Cough: Secondary | ICD-10-CM

## 2016-09-30 DIAGNOSIS — R0989 Other specified symptoms and signs involving the circulatory and respiratory systems: Secondary | ICD-10-CM | POA: Diagnosis not present

## 2016-09-30 NOTE — Progress Notes (Signed)
Location:   [facility] Room Number: 139/D Place of Service:  SNF (31) Provider:  [place] Lewis  Patient, [name]  Patient Care Team: Patient, [name] as PCP - General (General Practice) Danie Binder, MD as Consulting Physician (Gastroenterology) [name], MD as Consulting Physician [facility])  Extended Emergency Contact Information Primary Emergency Contact: Black,Shirley Address: [address]          [place], [place] [address] [name] of [place] Phone: [phone] Mobile Phone: [phone] Relation: Daughter  Code Status:  DNR Goals of care: Advanced Directive information Advanced Directives 09/30/2016  Does Patient Have a Medical Advance Directive? Yes  Type of Advance Directive Out of facility DNR (pink MOST or yellow form)  Does patient want to make changes to medical advance directive? No - Patient declined  Copy of [place] in Chart? -  Pre-existing out of facility DNR order (yellow form or pink MOST form) -     Chief Complaint  Patient presents with  . Acute Visit    cough and congestion    HPI:  Pt is a 83 y.o. male seen today for an acute visit for  Follow-up of a cough.  Patient does have a history of pneumonia in the past required hospitalization thought to be aspiration related-also history of anemia osteoarthritis risks leg syndrome BPH with urinary retention CHF and peripheral vascular disease as well as hypertension.  Despite his advanced age he has been remarkably stable-however today apparently he developed a cough with some congestion and nursing has already obtained a chest x-ray results are pending.  He does have when necessary [place] for cough-he is not complaining of any shortness of breath or distress he is afebrile O2 sats rations are satisfactory at 94% on room air   he does have a listed history of CHF as well but this has been stable for an extended period of time his Lasix has  been discontinued secondary to concerns of hypotension    Past Medical History:  Diagnosis Date  . Anxiety   . Bilateral foot pain   . CHF (congestive heart failure) ([place])   . Diverticulitis   . DJD (degenerative joint disease), cervical   . DNR (do not resuscitate)   . Esophageal dysmotility    age related per BPE 04/2015  . Esophageal stricture   . GERD (gastroesophageal reflux disease)   . Gout   . Hiatal hernia   . History of recurrent TIAs   . HTN (hypertension)   . Hyperlipidemia   . Inguinal hernia   . Ischemic heart disease   . Kidney stone   . Osteoarthritis    bilat knees  . Pleural effusion   . Reflux   . Restless leg syndrome   . Right hip pain   . Skin cancer, basal cell   . Urinary retention    Past Surgical History:  Procedure Laterality Date  . ESOPHAGEAL DILATION    . EXPLORATORY LAPAROTOMY W/ BOWEL RESECTION    . FRACTURE SURGERY    . HEMIARTHROPLASTY HIP     Dr. Aline Brochure  . intestines      Allergies  Allergen Reactions  . Celebrex [Celecoxib] Other (See Comments)    Unknown; patient can't remember  . Codeine Other (See Comments)    nausea    Outpatient Encounter Prescriptions as of 09/30/2016  Medication Sig  . aspirin EC 81 MG tablet Take 81 mg by mouth daily.  Marland Kitchen  CAMPHOR-EUCALYPTUS-MENTHOL EX Apply 1 application topically 2 (two) times daily as needed.  . Cholecalciferol (VITAMIN D) 2000 UNITS CAPS Take 1 capsule by mouth daily.  Marland Kitchen docusate sodium (COLACE) 100 MG capsule Take 1 capsule (100 mg total) by mouth 2 (two) times daily. For constipation.  Marland Kitchen guaifenesin (ROBITUSSIN) 100 MG/5ML syrup Take 300 mg by mouth every 6 (six) hours as needed for cough or congestion.   Marland Kitchen HYDROcodone-acetaminophen (NORCO/VICODIN) 5-325 MG tablet Take one tablet by mouth twice daily and one tablet by mouth every 6 hours as needed for pain. DNE 3gm of APAP/24hrs  . magnesium hydroxide (MILK OF MAGNESIA) 400 MG/5ML suspension Take 30 mLs by mouth daily as needed  for mild constipation. If no relief try a Fleets enema, after 2 days.  . Menthol 4.8 MG LOZG Take 1 by mouth every 4 hours prn  . Menthol, Topical Analgesic, (BIOFREEZE) 4 % GEL Apply small to back as needed for pain  . omeprazole (PRILOSEC) 20 MG capsule Take 20 mg by mouth daily.  Marland Kitchen Propylene Glycol 0.6 % SOLN Apply 1 drop to both eyes twice a day  . rOPINIRole (REQUIP) 2 MG tablet Take 2 mg by mouth at bedtime.  . sertraline (ZOLOFT) 25 MG tablet Take 37.5 mg by mouth daily.    No facility-administered encounter medications on file as of 09/30/2016.     Review of Systems In general does not complain of any fever or chills. Says he feels ok--  Skin does not complain of rashes or itching does have history of numerous skin cancers there have been followed by dermatology.--   Head ears eyes nose mouth and throat does not complain at this time of dysphagia or sore throat or visual changes.  Respiratory is not planning shortness breath but does have a significant dry cough  Cardiac denies chest pain has mild lower extremity edema this appears stabilized.  GU  Has a chronic indwelling Foley catheter does not really complain of any suprapubic pain--or dysuria  M Muscle skeletal currently is not complaining of joint pain does have significant osteoarthritis--and stiffness  Neurologic is not complaining of dizziness headache or syncope.  Psych he is to be pleasant does not complain of overt anxiety or depression is on Zoloft with what appears to be beneficial effect Immunization History  Administered Date(s) Administered  . Influenza,inj,Quad PF,36+ Mos 12/07/2014  . Influenza-Unspecified 11/20/2013, 11/13/2015  . PPD Test 03/16/2013  . Pneumococcal-Unspecified 02/14/2009, 11/25/2015   Pertinent  Health Maintenance Due  Topic Date Due  . INFLUENZA VACCINE  01/14/2017 (Originally 09/14/2016)  . PNA vac Low Risk Adult (2 of 2 - PCV13) 11/24/2016   No flowsheet data  found. Functional Status Survey:    Vitals:   09/30/16 1032  BP: 123/70  Pulse: 91  Resp: 18  Temp: 98.5 F (36.9 C)  TempSrc: Oral  SpO2: 94%  Weight is 170.4 pounds  Physical Exam In general is a pleasantly male in no distress   His skin is warm and dry he has numerous solar induced changes skin cancer removal sites that appear to be stable.  -      Oropharynx clear mucous membranes moist.     Chest has some crackles in the lower bases-there is no labored breathing-has shallow but clear airflow elsewhere.   Heart is regular rate and rhythm without murmur gallop or rub he has minimaledema lower extremities bilaterally.--Heart sounds are somewhat distant  Abdomen is protuberant nontender well-healed surgical scar in right-sided hernia which  appears unchanged from previous exams he has positive bowel sounds.  GU he has an indwelling Foley catheter draining amber colored urine  Musculoskeletal has general frailty but moves extremities at baseline He does have significant lower extremity stiffness-which is baseline-h  I      Neurologic is grossly intact to speech is clear no lateralizing findings.  Psych he continues to be largely alert and oriented pleasant cooperative which is his baseline. Labs reviewed:  Recent Labs  07/18/16 0734 08/18/16 0600 09/05/16 0600  NA 135 135 136  K 4.7 4.2 4.1  CL 100* 100* 102  CO2 28 29 27   GLUCOSE 90 91 94  BUN 12 14 14   CREATININE 1.29* 0.99 0.97  CALCIUM 8.5* 8.7* 8.7*    Recent Labs  10/14/15 0750 04/13/16 2220 04/14/16 0544  AST 18 23 17   ALT 13* 19 16*  ALKPHOS 104 80 75  BILITOT 0.4 0.6 0.7  PROT 6.8 7.0 6.8  ALBUMIN 3.4* 2.8* 2.6*    Recent Labs  04/04/16 0700 04/13/16 2220  05/25/16 0700 07/14/16 0740 09/05/16 0600  WBC 5.6 15.7*  < > 7.4 6.5 6.9  NEUTROABS 3.1 12.5*  --   --  3.7  --   HGB 10.6* 10.3*  < > 11.4* 11.2* 10.5*  HCT 31.6* 31.4*  < > 34.7* 34.8*  31.9*  MCV 82.7 82.4  < > 84.4 86.4 87.4  PLT 154 358  < > 195 233 146*  < > = values in this interval not displayed. Lab Results  Component Value Date   TSH 1.323 04/13/2016   Lab Results  Component Value Date   HGBA1C 6.1 (H) 11/12/2014   No results found for: CHOL, HDL, LDLCALC, LDLDIRECT, TRIG, CHOLHDL  Significant Diagnostic Results in last 30 days:  No results found.  Assessment/Plan  #1 cough-this appears to be relatively nonproductive will add Mucinex 600 mg twice a day for 7 days-he does have a significant history of pneumonia thought to be aspiration related will await chest x-ray results monitor vital signs pulse ox every shift for 72 hours.  Clinically he does not show any signs of overt sepsis or compromise but this will have to be watched he is a vulnerable individual again with hospitalization in the past for pneumonia.  I also note he does have a history CHF which has been well controlled weight has been relatively stable as well as edema.  Again will await chest x-ray results   [phone]  Addendum We have obtained the updated x-ray which shows a mild right basilar infiltrate-will start Augmentin 500 mg twice a day for 7 days-also probiotic twice a day 10 days--also will add duo nebs as needed every 6 hours for 5 days area  Continue to monitor with vital signs pulse

## 2016-10-11 ENCOUNTER — Non-Acute Institutional Stay (SKILLED_NURSING_FACILITY): Payer: Medicare Other | Admitting: Internal Medicine

## 2016-10-11 ENCOUNTER — Encounter: Payer: Self-pay | Admitting: Internal Medicine

## 2016-10-11 DIAGNOSIS — J690 Pneumonitis due to inhalation of food and vomit: Secondary | ICD-10-CM

## 2016-10-11 DIAGNOSIS — R1310 Dysphagia, unspecified: Secondary | ICD-10-CM

## 2016-10-11 DIAGNOSIS — I739 Peripheral vascular disease, unspecified: Secondary | ICD-10-CM | POA: Diagnosis not present

## 2016-10-11 DIAGNOSIS — K219 Gastro-esophageal reflux disease without esophagitis: Secondary | ICD-10-CM

## 2016-10-11 NOTE — Progress Notes (Signed)
Location:    [place] Room Number: 139/D Place of Service:  SNF (31) Provider:  Okley Magnussen,Jerolene Kupfer  Patient, [name]  Patient Care Team: Patient, [name], Marga Melnick, MD as Consulting Physician (Gastroenterology) [name], MD as Consulting Physician [facility])  Extended Emergency Contact Information Primary Emergency Contact: Black,Shirley Address: [address]          [place], [place] [address] [name] of [place] [address] Phone: [phone] Mobile Phone: [phone] Relation: Daughter  Code Status:  DNR Goals of care: Advanced Directive information Advanced Directives 10/11/2016  Does Patient Have a Medical Advance Directive? Yes  Type of Advance Directive Out of facility DNR (pink MOST or yellow form)  Does patient want to make changes to medical advance directive? No - Patient declined  Copy of [place] in Chart? -  Pre-existing out of facility DNR order (yellow form or pink MOST form) -     Chief Complaint  Patient presents with  . Medical Management of Chronic Issues    Routine Visit    HPI:  Pt is a 83 y.o. male seen today for medical management of chronic diseases.     Patient Is Long term resident of facility and has h/o HTN, CHF, Depression, Arthritis, Anemia, GERD, Chronic Foley Cathter due to Urinary retention.PVD ABI in 2015 refused Aggressive approach. Pateint is doing well today. He was seen few weeks ago and was diagnosed with Aspiration pneumina and was treated with Augemntin He has done well. Denies any Fever or chills. SOB is better. Cough is better. Weight today is 169 lbs which is up from 157 lbs. He has slowly gained weight over past few months. He says he has been eating well especially Junk Snacks.  Past Medical History:  Diagnosis Date  . Anxiety   . Bilateral foot pain   . CHF (congestive heart failure) ([place])   . Diverticulitis   . DJD  (degenerative joint disease), cervical   . DNR (do not resuscitate)   . Esophageal dysmotility    age related per BPE 04/2015  . Esophageal stricture   . GERD (gastroesophageal reflux disease)   . Gout   . Hiatal hernia   . History of recurrent TIAs   . HTN (hypertension)   . Hyperlipidemia   . Inguinal hernia   . Ischemic heart disease   . Kidney stone   . Osteoarthritis    bilat knees  . Pleural effusion   . Reflux   . Restless leg syndrome   . Right hip pain   . Skin cancer, basal cell   . Urinary retention    Past Surgical History:  Procedure Laterality Date  . ESOPHAGEAL DILATION    . EXPLORATORY LAPAROTOMY W/ BOWEL RESECTION    . FRACTURE SURGERY    . HEMIARTHROPLASTY HIP     Dr. Aline Brochure  . intestines      Allergies  Allergen Reactions  . Celebrex [Celecoxib] Other (See Comments)    Unknown; patient can't remember  . Codeine Other (See Comments)    nausea    Outpatient Encounter Prescriptions as of 10/11/2016  Medication Sig  . aspirin EC 81 MG tablet Take 81 mg by mouth daily.  Marland Kitchen CAMPHOR-EUCALYPTUS-MENTHOL EX Apply 1 application topically 2 (two) times daily as needed.  . Cholecalciferol (VITAMIN D) 2000 UNITS CAPS Take 1 capsule by mouth daily.  Marland Kitchen docusate sodium (COLACE) 100 MG capsule Take 1  capsule (100 mg total) by mouth 2 (two) times daily. For constipation.  Marland Kitchen guaifenesin (ROBITUSSIN) 100 MG/5ML syrup Take 300 mg by mouth every 6 (six) hours as needed for cough or congestion.   Marland Kitchen HYDROcodone-acetaminophen (NORCO/VICODIN) 5-325 MG tablet Take one tablet by mouth twice daily and one tablet by mouth every 6 hours as needed for pain. DNE 3gm of APAP/24hrs  . magnesium hydroxide (MILK OF MAGNESIA) 400 MG/5ML suspension Take 30 mLs by mouth daily as needed for mild constipation. If no relief try a Fleets enema, after 2 days.  . Menthol 4.8 MG LOZG Take 1 by mouth every 4 hours prn  . Menthol, Topical Analgesic, (BIOFREEZE) 4 % GEL Apply small to back as  needed for pain  . omeprazole (PRILOSEC) 20 MG capsule Take 20 mg by mouth daily.  Marland Kitchen Propylene Glycol 0.6 % SOLN Apply 1 drop to both eyes twice a day  . rOPINIRole (REQUIP) 2 MG tablet Take 2 mg by mouth at bedtime.  . sertraline (ZOLOFT) 25 MG tablet Take 37.5 mg by mouth daily.    No facility-administered encounter medications on file as of 10/11/2016.      Review of Systems  Review of Systems  Constitutional: Negative for activity change, appetite change, chills, diaphoresis, fatigue and fever.  HENT: Negative for mouth sores, postnasal drip, rhinorrhea, sinus pain and sore throat.   Respiratory: Negative for apnea, cough, chest tightness, shortness of breath and wheezing.   Cardiovascular: Negative for chest pain, palpitations and leg swelling.  Gastrointestinal: Negative for abdominal distention, abdominal pain, constipation, diarrhea, nausea and vomiting.  Genitourinary: Negative for dysuria and frequency.  Musculoskeletal: Negative for arthralgias, joint swelling and myalgias.  Skin: Negative for rash.  Neurological: Negative for dizziness, syncope, weakness, light-headedness and numbness.  Psychiatric/Behavioral: Negative for behavioral problems, confusion and sleep disturbance.     Immunization History  Administered Date(s) Administered  . Influenza,inj,Quad PF,6+ Mos 12/07/2014  . Influenza-Unspecified 11/20/2013, 11/13/2015  . PPD Test 03/16/2013  . Pneumococcal-Unspecified 02/14/2009, 11/25/2015   Pertinent  Health Maintenance Due  Topic Date Due  . INFLUENZA VACCINE  01/14/2017 (Originally 09/14/2016)  . PNA vac Low Risk Adult (2 of 2 - PCV13) 11/24/2016   No flowsheet data found. Functional Status Survey:    Vitals:   10/11/16 1111  BP: 138/81  Pulse: 72  Resp: 18  Temp: (!) 97.2 F (36.2 C)  TempSrc: Oral  SpO2: 100%  Weight: 169 lb 6.4 oz (76.8 kg)   Body mass index is 22.97 kg/m. Physical Exam  Constitutional: He appears well-developed and  well-nourished.  HENT:  Head: Normocephalic.  Mouth/Throat: Oropharynx is clear and moist.  Eyes: Pupils are equal, round, and reactive to light.  Neck: Neck supple.  Cardiovascular: Normal rate and normal heart sounds.   Pulmonary/Chest: Effort normal and breath sounds normal. No respiratory distress. He has no wheezes. He has no rales.  Abdominal: Soft. Bowel sounds are normal. He exhibits no distension. There is no tenderness. There is no rebound.  Musculoskeletal:  Trace edema B/l  Cold Feet with Feeble pulses  Neurological: He is alert.  Skin: Skin is warm and dry.    Labs reviewed:  Recent Labs  07/18/16 0734 08/18/16 0600 09/05/16 0600  NA 135 135 136  K 4.7 4.2 4.1  CL 100* 100* 102  CO2 28 29 27   GLUCOSE 90 91 94  BUN 12 14 14   CREATININE 1.29* 0.99 0.97  CALCIUM 8.5* 8.7* 8.7*    Recent Labs  10/14/15 0750 04/13/16 2220 04/14/16 0544  AST 18 23 17   ALT 13* 19 16*  ALKPHOS 104 80 75  BILITOT 0.4 0.6 0.7  PROT 6.8 7.0 6.8  ALBUMIN 3.4* 2.8* 2.6*    Recent Labs  04/04/16 0700 04/13/16 2220  05/25/16 0700 07/14/16 0740 09/05/16 0600  WBC 5.6 15.7*  < > 7.4 6.5 6.9  NEUTROABS 3.1 12.5*  --   --  3.7  --   HGB 10.6* 10.3*  < > 11.4* 11.2* 10.5*  HCT 31.6* 31.4*  < > 34.7* 34.8* 31.9*  MCV 82.7 82.4  < > 84.4 86.4 87.4  PLT 154 358  < > 195 233 146*  < > = values in this interval not displayed. Lab Results  Component Value Date   TSH 1.323 04/13/2016   Lab Results  Component Value Date   HGBA1C 6.1 (H) 11/12/2014   No results found for: CHOL, HDL, LDLCALC, LDLDIRECT, TRIG, CHOLHDL  Significant Diagnostic Results in last 30 days:  No results found.  Assessment/Plan  Possible Aspirtaion Pneumonia Patient had Right Lower Lobe infiltrate recently and was treated with Augmentin He is doing well. No fever. Continue with Dysphagic diet  Recent weight Gain Will continue to follow patient says he is eating well.  GERD Stable on  Prilosec.  PVD Patient refuses further work up.  Iron deficiency anemia,  Hgb Stable No aggressive work up due to his age.  Depression Doing well On Zoloft  Osteoarthritis Pain controlled on Hydrocodone  Restless legs Doing well on Requip  Urinary Retention Continue Foley Cathter.   Family/ staff Communication:   Labs/tests ordered:    Total time spent in this patient care encounter was _25 minutes; greater than 50% of the visit spent counseling patient, reviewing records , Labs and coordinating care for problems addressed at this encounter.

## 2016-10-13 DIAGNOSIS — I739 Peripheral vascular disease, unspecified: Secondary | ICD-10-CM | POA: Insufficient documentation

## 2016-10-17 DIAGNOSIS — R05 Cough: Secondary | ICD-10-CM | POA: Diagnosis not present

## 2016-10-18 ENCOUNTER — Non-Acute Institutional Stay (SKILLED_NURSING_FACILITY): Payer: Medicare Other | Admitting: Internal Medicine

## 2016-10-18 ENCOUNTER — Encounter: Payer: Self-pay | Admitting: Internal Medicine

## 2016-10-18 DIAGNOSIS — R059 Cough, unspecified: Secondary | ICD-10-CM

## 2016-10-18 DIAGNOSIS — R05 Cough: Secondary | ICD-10-CM | POA: Diagnosis not present

## 2016-10-18 NOTE — Progress Notes (Signed)
Location:   [place] Room Number: 139/D Place of Service:  SNF (31) Provider:  [place] Lewis  Patient, [name]  Patient Care Team: Patient, [name] as PCP - General (General Practice) Danie Binder, MD as Consulting Physician (Gastroenterology) [name], MD as Consulting Physician [facility])  Extended Emergency Contact Information Primary Emergency Contact: Black,Shirley Address: [address]          [place], [place] [address] [name] of [place] Phone: [phone] Mobile Phone: [phone] Relation: Daughter  Code Status:  DNR Goals of care: Advanced Directive information Advanced Directives 10/18/2016  Does Patient Have a Medical Advance Directive? Yes  Type of Advance Directive Out of facility DNR (pink MOST or yellow form)  Does patient want to make changes to medical advance directive? No - Patient declined  Copy of [place] in Chart? -  Pre-existing out of facility DNR order (yellow form or pink MOST form) -     Chief Complaint  Patient presents with  . Acute Visit    Cough    HPI:  Pt is a 83 y.o. male seen today for an acute visit for Follow-up of cough.  Patient does have a history of pneumonia in the past which at one time actually required hospitalization-and x-ray has been done which does not show any acute process-he does have a history of right lower lung pneumonia in the past and did complete a course of Augmentin recently.  He is on duo nebs as needed when necessary as well as Mucinex twice a day.  He also has an order for [address] [facility] every 6 hours when necessary.  Currently he says his cough is better-denies any shortness of breath he is sitting in his chair comfortably is pleasant and conversant.     Past Medical History:  Diagnosis Date  . Anxiety   . Bilateral foot pain   . CHF (congestive heart failure) ([place])   . Diverticulitis   . DJD (degenerative joint disease), cervical   .  DNR (do not resuscitate)   . Esophageal dysmotility    age related per BPE 04/2015  . Esophageal stricture   . GERD (gastroesophageal reflux disease)   . Gout   . Hiatal hernia   . History of recurrent TIAs   . HTN (hypertension)   . Hyperlipidemia   . Inguinal hernia   . Ischemic heart disease   . Kidney stone   . Osteoarthritis    bilat knees  . Pleural effusion   . Reflux   . Restless leg syndrome   . Right hip pain   . Skin cancer, basal cell   . Urinary retention    Past Surgical History:  Procedure Laterality Date  . ESOPHAGEAL DILATION    . EXPLORATORY LAPAROTOMY W/ BOWEL RESECTION    . FRACTURE SURGERY    . HEMIARTHROPLASTY HIP     Dr. Aline Brochure  . intestines      Allergies  Allergen Reactions  . Celebrex [Celecoxib] Other (See Comments)    Unknown; patient can't remember  . Codeine Other (See Comments)    nausea    Outpatient Encounter Prescriptions as of 10/18/2016  Medication Sig  . aspirin EC 81 MG tablet Take 81 mg by mouth daily.  Marland Kitchen CAMPHOR-EUCALYPTUS-MENTHOL EX Apply 1 application topically 2 (two) times daily as needed.  . Cholecalciferol (VITAMIN D) 2000 UNITS CAPS Take 1 capsule by mouth daily.  Marland Kitchen docusate sodium (COLACE) 100 MG  capsule Take 1 capsule (100 mg total) by mouth 2 (two) times daily. For constipation.  Marland Kitchen guaiFENesin (MUCINEX) 600 MG 12 hr tablet Take 600 mg by mouth 2 (two) times daily.  Marland Kitchen guaifenesin (ROBITUSSIN) 100 MG/5ML syrup Take 300 mg by mouth every 6 (six) hours as needed for cough or congestion.   Marland Kitchen HYDROcodone-acetaminophen (NORCO/VICODIN) 5-325 MG tablet Take one tablet by mouth twice daily and one tablet by mouth every 6 hours as needed for pain. DNE 3gm of APAP/24hrs  . ipratropium-albuterol (DUONEB) 0.5-2.5 (3) MG/3ML SOLN Take 3 mLs by nebulization every 6 (six) hours. And additional 3 ml every 6 hours prn  . magnesium hydroxide (MILK OF MAGNESIA) 400 MG/5ML suspension Take 30 mLs by mouth daily as needed for mild  constipation. If no relief try a Fleets enema, after 2 days.  . Menthol 4.8 MG LOZG Take 1 by mouth every 4 hours prn  . Menthol, Topical Analgesic, (BIOFREEZE) 4 % GEL Apply small to back as needed for pain  . omeprazole (PRILOSEC) 20 MG capsule Take 20 mg by mouth daily.  Marland Kitchen Propylene Glycol 0.6 % SOLN Apply 1 drop to both eyes twice a day  . rOPINIRole (REQUIP) 2 MG tablet Take 2 mg by mouth at bedtime.  . sertraline (ZOLOFT) 25 MG tablet Take 37.5 mg by mouth daily.    No facility-administered encounter medications on file as of 10/18/2016.     Review of Systems   In general is not complaining of any fever or chills says he feels better today.  Skin does not complain of diaphoresis rashes or itching does have numerous skin cancers which have been followed by dermatology and removed.  Head ears eyes nose mouth and throat denies visual changes I drainage or sore throat.  Respiratory denies shortness breath still has a cough but says it's improved.  Chest does not complain of chest pain or palpitations has fairly minimal lower extremity edema.  GI does not complain of nausea vomiting diarrhea or constipation or abdominal discomfort.  GU has a chronic indwelling Foley catheter does not complain of burning with urination.  Muscle skeletal at this time does not really complaining of joint pain.  Neurologic does not complain of headache dizziness or numbness.  Psych continues to be in good spirits does not complain of depression or anxiety      Immunization History  Administered Date(s) Administered  . Influenza,inj,Quad PF,6+ Mos 12/07/2014  . Influenza-Unspecified 11/20/2013, 11/13/2015  . PPD Test 03/16/2013  . Pneumococcal-Unspecified 02/14/2009, 11/25/2015   Pertinent  Health Maintenance Due  Topic Date Due  . INFLUENZA VACCINE  01/14/2017 (Originally 09/14/2016)  . PNA vac Low Risk Adult (2 of 2 - PCV13) 11/24/2016   No flowsheet data found. Functional Status Survey:     Vitals:   10/18/16 1453  BP: 130/65  Pulse: 91  Resp: 20  Temp: 98.3 F (36.8 C)  TempSrc: Oral    Physical Exam   In general this is a somewhat frail elderly male who looks younger than his stated age.  His skin is warm and dry does have numerous solar induced changes and places for skin cancers have been removed.   Oropharynx clear mucous membranes moist.  Eyes visual acuity appears grossly intact sclera and conjunctiva are clear   Chest is clear to auscultation with shallow air entry there is no labored breathing.  Heart is regular rate and rhythm without murmur gallop or rub he has minimal lower extremity edema.  His  abdomen is soft nontender with positive bowel sounds he does have a well-healed surgical scar on the right side with a history of hernia.  GU continues with an indwelling Foley catheter draining amber colored urine.  Muscle skeletal moves all extremities at baseline with baseline arthritic changes and some stiffness of his lower extremities which is chronic.  Neurologic is grossly intact no lateralizing findings speech is clear.  Psych he is alert and oriented pleasant and appropriate     Labs reviewed:  Recent Labs  07/18/16 0734 08/18/16 0600 09/05/16 0600  NA 135 135 136  K 4.7 4.2 4.1  CL 100* 100* 102  CO2 28 29 27   GLUCOSE 90 91 94  BUN 12 14 14   CREATININE 1.29* 0.99 0.97  CALCIUM 8.5* 8.7* 8.7*    Recent Labs  04/13/16 2220 04/14/16 0544  AST 23 17  ALT 19 16*  ALKPHOS 80 75  BILITOT 0.6 0.7  PROT 7.0 6.8  ALBUMIN 2.8* 2.6*    Recent Labs  04/04/16 0700 04/13/16 2220  05/25/16 0700 07/14/16 0740 09/05/16 0600  WBC 5.6 15.7*  < > 7.4 6.5 6.9  NEUTROABS 3.1 12.5*  --   --  3.7  --   HGB 10.6* 10.3*  < > 11.4* 11.2* 10.5*  HCT 31.6* 31.4*  < > 34.7* 34.8* 31.9*  MCV 82.7 82.4  < > 84.4 86.4 87.4  PLT 154 358  < > 195 233 146*  < > = values in this interval not displayed. Lab Results  Component Value Date    TSH 1.323 04/13/2016   Lab Results  Component Value Date   HGBA1C 6.1 (H) 11/12/2014   No results found for: CHOL, HDL, LDLCALC, LDLDIRECT, TRIG, CHOLHDL  Significant Diagnostic Results in last 30 days:  No results found.  Assessment/Plan  Cough-with history of pneumonia again x-ray is reassuring-will continue the Mucinex as well as when necessary duo nebs does have thousand as needed this appears stable he says he is feeling better today will monitor  [phone]

## 2016-11-07 ENCOUNTER — Encounter: Payer: Self-pay | Admitting: Internal Medicine

## 2016-11-07 ENCOUNTER — Non-Acute Institutional Stay (SKILLED_NURSING_FACILITY): Payer: Medicare Other | Admitting: Internal Medicine

## 2016-11-07 DIAGNOSIS — R1310 Dysphagia, unspecified: Secondary | ICD-10-CM

## 2016-11-07 DIAGNOSIS — J690 Pneumonitis due to inhalation of food and vomit: Secondary | ICD-10-CM | POA: Diagnosis not present

## 2016-11-07 DIAGNOSIS — I739 Peripheral vascular disease, unspecified: Secondary | ICD-10-CM

## 2016-11-07 DIAGNOSIS — I509 Heart failure, unspecified: Secondary | ICD-10-CM | POA: Diagnosis not present

## 2016-11-07 DIAGNOSIS — G2581 Restless legs syndrome: Secondary | ICD-10-CM | POA: Diagnosis not present

## 2016-11-07 DIAGNOSIS — M1712 Unilateral primary osteoarthritis, left knee: Secondary | ICD-10-CM | POA: Diagnosis not present

## 2016-11-07 NOTE — Progress Notes (Signed)
Location:   [place] Room Number: 139/D Place of Service:  SNF (31) Provider:  Ajiah Mcglinn,Ali Mclaurin  Patient, [name]  Patient Care Team: Patient, [name], Marga Melnick, MD as Consulting Physician (Gastroenterology) [name], MD as Consulting Physician [facility])  Extended Emergency Contact Information Primary Emergency Contact: Black,Shirley Address: [address]          [place], [place] [address] [name] of [place] Phone: [phone] Mobile Phone: [phone] Relation: Daughter  Code Status:  DNR Goals of care: Advanced Directive information Advanced Directives 11/07/2016  Does Patient Have a Medical Advance Directive? Yes  Type of Advance Directive Out of facility DNR (pink MOST or yellow form)  Does patient want to make changes to medical advance directive? No - Patient declined  Copy of [place] in Chart? -  Pre-existing out of facility DNR order (yellow form or pink MOST form) -   Chief complaint-routine visit for medical management of chronic medical conditions including history of aspiration pneumonia-hypertension-CHF-osteoarthritis-anemia-urinary retention-GERD-peripheral vascular disease-restless leg syndrome-depression-    HPI:  Pt is a 83 y.o. male seen today for medical management of chronic diseases. As noted above-most recent acute issue is been episode of aspiration pneumonia however he has responded well at times to antibiotics and this appears to be fairly unremarkable currently-- he's been stable now for a fairly significant amount of time he is on a dysphagia 3 diet with thin liquids he continues on Mucinex also has duo nebs as needed--  His weight has been stable apparently his appetite has been pretty good.  Does have a history of hypertension but currently not on any meds at times will have hypotensive readings but has been asymptomatic ever since his Flomax was  discontinued.  I got a manual reading of 130/64 this evening previous listed readings 93/58-119/64.  He is being treated for a fungal rash with Lotrimin currently apparently this is slowly improving rashes in his groin area.  He does have a history CHF but is not on Lasix nonetheless edema appears to be minimal clinically appears to be stable.  He also has a history of diffuse osteoarthritic pain more so on his legs he is on Vicodin and does have topical treatment as well.  He has anemia of chronic disease most likely but hemoglobin has been stable  -most recently 10.5 on lab done in July 2018.  He also has a history of peripheral vascular disease but does not really want aggressive intervention which is reasonable considering his advanced age he is not really complaining of significant pain in this regards he does receive aspirin low dose.  Continues with a chronic indwelling Foley catheter which he has tolerated well he does have a history of urinary retention again Flomax was discontinued because of hypotension a.  He also has a history of restless legs which appears to have responded well to Requip.  One point he expressed some depressive thoughts this was of short duration he was started on Zoloft and this appears to really help.  Today he has no complaints he is sitting in his wheelchair comfortably and watching television he does watch the news and appears to be up-to-date on current events--he also recently his paper.  He says he would like new glasses but apparently there are financial issues-I did discuss this with nursing and will discuss with social worker as well  He does not complaining of acute visual  changes but says he has somewhat more difficulty reading paper  At one point he had cellulitis lower extremities which was challenging. He had one point was on Cipro and doxycycline but had experienced significant dysphagia-recommendation is try to avoid Cipro and doxycycline  together in the future.         Past Medical History:  Diagnosis Date  . Anxiety   . Bilateral foot pain   . CHF (congestive heart failure) ([place])   . Diverticulitis   . DJD (degenerative joint disease), cervical   . DNR (do not resuscitate)   . Esophageal dysmotility    age related per BPE 04/2015  . Esophageal stricture   . GERD (gastroesophageal reflux disease)   . Gout   . Hiatal hernia   . History of recurrent TIAs   . HTN (hypertension)   . Hyperlipidemia   . Inguinal hernia   . Ischemic heart disease   . Kidney stone   . Osteoarthritis    bilat knees  . Pleural effusion   . Reflux   . Restless leg syndrome   . Right hip pain   . Skin cancer, basal cell   . Urinary retention    Past Surgical History:  Procedure Laterality Date  . ESOPHAGEAL DILATION    . EXPLORATORY LAPAROTOMY W/ BOWEL RESECTION    . FRACTURE SURGERY    . HEMIARTHROPLASTY HIP     Dr. Aline Brochure  . intestines      Allergies  Allergen Reactions  . Celebrex [Celecoxib] Other (See Comments)    Unknown; patient can't remember  . Codeine Other (See Comments)    nausea    Outpatient Encounter Prescriptions as of 11/07/2016  Medication Sig  . aspirin EC 81 MG tablet Take 81 mg by mouth daily.  Marland Kitchen CAMPHOR-EUCALYPTUS-MENTHOL EX Apply 1 application topically 2 (two) times daily as needed.  . Cholecalciferol (VITAMIN D) 2000 UNITS CAPS Take 1 capsule by mouth daily.  . clotrimazole (LOTRIMIN) 1 % cream Apply 1 application topically 2 (two) times daily.  Marland Kitchen docusate sodium (COLACE) 100 MG capsule Take 1 capsule (100 mg total) by mouth 2 (two) times daily. For constipation.  Marland Kitchen guaiFENesin (MUCINEX) 600 MG 12 hr tablet Take 600 mg by mouth 2 (two) times daily.  Marland Kitchen guaifenesin (ROBITUSSIN) 100 MG/5ML syrup Take 300 mg by mouth every 6 (six) hours as needed for cough or congestion.   Marland Kitchen HYDROcodone-acetaminophen (NORCO/VICODIN) 5-325 MG tablet Take one tablet by mouth twice daily and one tablet by mouth  every 6 hours as needed for pain. DNE 3gm of APAP/24hrs  . ipratropium-albuterol (DUONEB) 0.5-2.5 (3) MG/3ML SOLN Take 3 mLs by nebulization every 6 (six) hours. And additional 3 ml every 6 hours prn  . magnesium hydroxide (MILK OF MAGNESIA) 400 MG/5ML suspension Take 30 mLs by mouth daily as needed for mild constipation. If no relief try a Fleets enema, after 2 days.  . Menthol 4.8 MG LOZG Take 1 by mouth every 4 hours prn  . Menthol, Topical Analgesic, (BIOFREEZE) 4 % GEL Apply small to back as needed for pain  . omeprazole (PRILOSEC) 20 MG capsule Take 20 mg by mouth daily.  Marland Kitchen Propylene Glycol 0.6 % SOLN Apply 1 drop to both eyes twice a day  . rOPINIRole (REQUIP) 2 MG tablet Take 2 mg by mouth at bedtime.  . sertraline (ZOLOFT) 25 MG tablet Take 37.5 mg by mouth daily.    No facility-administered encounter medications on file as of 11/07/2016.  Review of Systems   In general is not complaining any fever chills weight appears to be stable.  Skin does not complain of rashes or itching has history of numerous skin cancer removals and is followed by dermatology.--Is being treated for a likely fungal rash perineal area  Head ears eyes nose mouth and throat again does not complain of acute visual changes but would like new glasses-as noted above does not complain of this to difficulty swallowing or sore throat.  Respiratory denies cough or shortness of breath.  Cardiac denies chest pain. Staff fairly minimal edema.  GI is not complaining of any abdominal discomfort he does have an abdominal hernia chronic does not complaining nausea vomiting diarrhea constipation.  Muscle skeletal does have a history of general osteoarthritis which appears controlled currently does not complain of pain currently.  Neurologic does not complain of dizziness headache or syncope.  Psych does not complaining of any depression or anxiety  appears to be doing well with the Zoloft very upbeat  Immunization  History  Administered Date(s) Administered  . Influenza,inj,Quad PF,6+ Mos 12/07/2014  . Influenza-Unspecified 11/20/2013, 11/13/2015  . PPD Test 03/16/2013  . Pneumococcal-Unspecified 02/14/2009, 11/25/2015   Pertinent  Health Maintenance Due  Topic Date Due  . INFLUENZA VACCINE  01/14/2017 (Originally 09/14/2016)  . PNA vac Low Risk Adult (2 of 2 - PCV13) 11/24/2016   No flowsheet data found. Functional Status Survey:    Vitals:   11/07/16 1446  BP: (!) 93/58  Pulse: 97  Resp: 16  Temp: 97.7 F (36.5 C)  TempSrc: Oral  SpO2: 97%  Weight: 168 lb 3.2 oz (76.3 kg)  Height: 6' (1.829 m)  Of note manual reading this evening was 130/64 Body mass index is 22.81 kg/m. Physical Exam  In general this is a pleasant elderly male in no distress sitting comfortably in his wheelchair he is bright alert and smiling and watching TV.  His skin is warm and dry he does have a small crusted darkened area on left side of his nose but this appears very minimal justa "dot" in size--this appears to be more of a healing scab.  He does have a pale erythematous rash-rash in the groin perineal area bilaterally this does not appear to be cellulitic-rash is somewhat obscured by cream at this time  Eyes he does have prescription lenses visual acuity appears grossly intact sclera and conjunctiva are clear.  Oropharynx clear mucous membranes moist.  Chest is clear to auscultation there is no labored breathing somewhat shallow air entry.  Heart is regular rate and rhythm with somewhat distant heart sounds could not appreciate a murmur gallop or rub he has minimal lower extremity edema.  Abdomen he does have a fairly large right-sided abdominal hernia abdomen is soft nontender somewhat protuberant with positive bowel sounds.  GU does have an indwelling Foley catheter draining amber colored urine.  Muscle skeletal ambulates largely in a wheelchair has arthritic changes most noticeable of his knees  bilaterally he has minimal lower extremity edema I do not see any sign of cellulitis tenderness. He ambulates in a wheelchair and does quite well considering his advanced age  Neurologic is grossly intact to speech is clear no lateralizing findings.  Psych continues to be alert and oriented pleasant and appropriate  Labs reviewed:  Recent Labs  07/18/16 0734 08/18/16 0600 09/05/16 0600  NA 135 135 136  K 4.7 4.2 4.1  CL 100* 100* 102  CO2 28 29 27   GLUCOSE 90 91 94  BUN 12 14 14   CREATININE 1.29* 0.99 0.97  CALCIUM 8.5* 8.7* 8.7*    Recent Labs  04/13/16 2220 04/14/16 0544  AST 23 17  ALT 19 16*  ALKPHOS 80 75  BILITOT 0.6 0.7  PROT 7.0 6.8  ALBUMIN 2.8* 2.6*    Recent Labs  04/04/16 0700 04/13/16 2220  05/25/16 0700 07/14/16 0740 09/05/16 0600  WBC 5.6 15.7*  < > 7.4 6.5 6.9  NEUTROABS 3.1 12.5*  --   --  3.7  --   HGB 10.6* 10.3*  < > 11.4* 11.2* 10.5*  HCT 31.6* 31.4*  < > 34.7* 34.8* 31.9*  MCV 82.7 82.4  < > 84.4 86.4 87.4  PLT 154 358  < > 195 233 146*  < > = values in this interval not displayed. Lab Results  Component Value Date   TSH 1.323 04/13/2016   Lab Results  Component Value Date   HGBA1C 6.1 (H) 11/12/2014   No results found for: CHOL, HDL, LDLCALC, LDLDIRECT, TRIG, CHOLHDL  Significant Diagnostic Results in last 30 days:  No results found.  Assessment/Plan  #1-history of aspiration pneumonia--with history of dysphagia- again he is on a dysphagia 3 diet with thin liquids this appears to have stabilized he does have Mucinex routinely as well as duo nebs when necessary-again he has had a period of stability here which is encouraging.  #2 hypertension off all meds secondary to occasional hypotensive readings nonetheless this appears stable as noted above blood pressure this evening [id number] he has systolics in the 56L but these are asymptomatic.  #3 congestive heart failure he is not on a diuretic nonetheless this appears to  be stable weight is stable edema is minimal he does not complain of shortness of breath or increased cough.  #4 depression this is stable as well on Zoloft.  #5 osteoarthritis has been significant in the past for pain appears to be controlled with the Vicodin-he also has Biofreeze topical--the Vicodin is routine twice a day and when necessary every 6 hours.  #6 history of restless leg syndrome this appears stable on Requip which she receives daily at bedtime.  #7 history of fungal rash he is on Lotriminwhen this appears to be slowly resolving I do not see evidence of cellulitis.  Number8--a history of anemia most likely chronic disease hemoglobin has been stable in the 9-11 range-will monitor periodically most recent hemoglobin 10.5.  #9-history of GERD he is on proton pump inhibitor this appears to help especially in light of his dysphagia.  #10-history of peripheral vascular disease again he does not want aggressive workup this appears to be stabilized his wounds have healed which is encouraging he is on aspirin.  #11 history vitamin D deficiency vitamin D level in March 2018was satisfactory at 43.8   #12-history of constipation this appears stable on current agents including Colace twice a day and milk of magnesia when necessary.  #13 history of difficulty reading the paper-again he would like a new prescription for glasses  I did discuss this with nursing and will discuss with social work apparently this has been discussed in the past unclear exactly what conclusion wa-- will see what we can do to help--I did not appreciate any acute visual changes on exam Of note eventually I did discuss this with social worker who says she will address this apparently he will have to pay for new glasses since insurance does not cover this but she will discuss this with him--  #14-history of urinary  retention he has an indwelling Foley catheter which appears to be doing relatively well-his Flomax has been  DC'd secondary to hypotension concerns  [id number]-of note greater than 35 minutes spent assessing patient-reviewing his chart-reviewing his labs-and discussing his status with nursing staff as well as with social worker-of note greater than 50% of time spent coordinating plan of care with input as noted above

## 2016-11-09 DIAGNOSIS — M79674 Pain in right toe(s): Secondary | ICD-10-CM | POA: Diagnosis not present

## 2016-11-09 DIAGNOSIS — B351 Tinea unguium: Secondary | ICD-10-CM | POA: Diagnosis not present

## 2016-11-09 DIAGNOSIS — I739 Peripheral vascular disease, unspecified: Secondary | ICD-10-CM | POA: Diagnosis not present

## 2016-11-09 DIAGNOSIS — M79675 Pain in left toe(s): Secondary | ICD-10-CM | POA: Diagnosis not present

## 2016-11-10 ENCOUNTER — Non-Acute Institutional Stay (SKILLED_NURSING_FACILITY): Payer: Medicare Other

## 2016-11-10 DIAGNOSIS — Z0000 Encounter for general adult medical examination without abnormal findings: Secondary | ICD-10-CM

## 2016-11-10 NOTE — Progress Notes (Signed)
Subjective:   Patrick [name] is a 83 y.o. male who presents for an Initial Medicare Annual Wellness Visit at [facility]   Objective:    Today's Vitals   11/10/16 1045  BP: (!) 125/51  Pulse: 63  Temp: 97.7 F (36.5 C)  TempSrc: Oral  SpO2: 96%  Weight: 169 lb (76.7 kg)  Height: 6' (1.829 m)   Body mass index is 22.92 kg/m.  Current Medications (verified) Outpatient Encounter Prescriptions as of 11/10/2016  Medication Sig  . aspirin EC 81 MG tablet Take 81 mg by mouth daily.  Marland Kitchen CAMPHOR-EUCALYPTUS-MENTHOL EX Apply 1 application topically 2 (two) times daily as needed.  . Cholecalciferol (VITAMIN D) 2000 UNITS CAPS Take 1 capsule by mouth daily.  . clotrimazole (LOTRIMIN) 1 % cream Apply 1 application topically 2 (two) times daily.  Marland Kitchen docusate sodium (COLACE) 100 MG capsule Take 1 capsule (100 mg total) by mouth 2 (two) times daily. For constipation.  Marland Kitchen guaiFENesin (MUCINEX) 600 MG 12 hr tablet Take 600 mg by mouth 2 (two) times daily.  Marland Kitchen guaifenesin (ROBITUSSIN) 100 MG/5ML syrup Take 300 mg by mouth every 6 (six) hours as needed for cough or congestion.   Marland Kitchen HYDROcodone-acetaminophen (NORCO/VICODIN) 5-325 MG tablet Take one tablet by mouth twice daily and one tablet by mouth every 6 hours as needed for pain. DNE 3gm of APAP/24hrs  . ipratropium-albuterol (DUONEB) 0.5-2.5 (3) MG/3ML SOLN Take 3 mLs by nebulization every 6 (six) hours. And additional 3 ml every 6 hours prn  . magnesium hydroxide (MILK OF MAGNESIA) 400 MG/5ML suspension Take 30 mLs by mouth daily as needed for mild constipation. If no relief try a Fleets enema, after 2 days.  . Menthol 4.8 MG LOZG Take 1 by mouth every 4 hours prn  . Menthol, Topical Analgesic, (BIOFREEZE) 4 % GEL Apply small to back as needed for pain  . omeprazole (PRILOSEC) 20 MG capsule Take 20 mg by mouth daily.  Marland Kitchen Propylene Glycol 0.6 % SOLN Apply 1 drop to both eyes twice a day  . rOPINIRole (REQUIP) 2 MG tablet Take  2 mg by mouth at bedtime.  . sertraline (ZOLOFT) 25 MG tablet Take 37.5 mg by mouth daily.    No facility-administered encounter medications on file as of 11/10/2016.     Allergies (verified) Celebrex [celecoxib] and Codeine   History: Past Medical History:  Diagnosis Date  . Anxiety   . Bilateral foot pain   . CHF (congestive heart failure) ([place])   . Diverticulitis   . DJD (degenerative joint disease), cervical   . DNR (do not resuscitate)   . Esophageal dysmotility    age related per BPE 04/2015  . Esophageal stricture   . GERD (gastroesophageal reflux disease)   . Gout   . Hiatal hernia   . History of recurrent TIAs   . HTN (hypertension)   . Hyperlipidemia   . Inguinal hernia   . Ischemic heart disease   . Kidney stone   . Osteoarthritis    bilat knees  . Pleural effusion   . Reflux   . Restless leg syndrome   . Right hip pain   . Skin cancer, basal cell   . Urinary retention    Past Surgical History:  Procedure Laterality Date  . ESOPHAGEAL DILATION    . EXPLORATORY LAPAROTOMY W/ BOWEL RESECTION    . FRACTURE SURGERY    . HEMIARTHROPLASTY HIP     Dr. Aline Brochure  .  intestines     Family History  Problem Relation Age of Onset  . Arthritis Unknown    Social History   Occupational History  . retired    Social History Main Topics  . Smoking status: Never Smoker  . Smokeless tobacco: Never Used  . Alcohol use No  . Drug use: No  . Sexual activity: Not on file   Tobacco Counseling Counseling given: Not Answered   Activities of Daily Living In your present state of health, do you have any difficulty performing the following activities: 11/10/2016 04/14/2016  Hearing? Tempie Donning  Vision? Y N  Difficulty concentrating or making decisions? N N  Walking or climbing stairs? Y Y  Dressing or bathing? Y Y  Doing errands, shopping? Tempie Donning  Preparing Food and eating ? Y -  Using the Toilet? Y -  In the past six months, have you accidently leaked urine? Y -  Comment  catheter -  Do you have problems with loss of bowel control? N -  Managing your Medications? Y -  Managing your Finances? Y -  Housekeeping or managing your Housekeeping? Y -  Some recent data might be hidden    Immunizations and Health Maintenance Immunization History  Administered Date(s) Administered  . Influenza,inj,Quad PF,6+ Mos 12/07/2014  . Influenza-Unspecified 11/20/2013, 11/13/2015  . PPD Test 03/16/2013  . Pneumococcal-Unspecified 02/14/2009, 11/25/2015   There are no preventive care reminders to display for this patient.  Patient Care Team: Patient, [name] as PCP - General (General Practice) Danie Binder, MD as Consulting Physician (Gastroenterology) [name], MD as Consulting Physician (Genetics)  Indicate any recent Medical Services you may have received from other than [facility] providers in the past year (date may be approximate).    Assessment:   This is a routine wellness examination for Patrick [name].   Hearing/Vision screen No exam data present  Dietary issues and exercise activities discussed: Current Exercise Habits: The patient does not participate in regular exercise at present, Exercise limited by: orthopedic condition(s)  Goals    None     Depression Screen PHQ 2/9 Scores 11/10/2016  PHQ - 2 Score 0    Fall Risk Fall Risk  11/10/2016  Falls in the past year? No    Cognitive Function:     6CIT Screen 11/10/2016  What Year? 0 points  What month? 3 points  What time? 0 points  Count back from 20 0 points  Months in reverse 4 points  Repeat phrase 4 points  Total Score 11    Screening Tests Health Maintenance  Topic Date Due  . INFLUENZA VACCINE  01/14/2017 (Originally 09/14/2016)  . TETANUS/TDAP  02/15/2023 (Originally 07/28/1932)  . PNA vac Low Risk Adult (2 of 2 - PCV13) 11/24/2016        Plan:    I have personally reviewed and addressed the Medicare Annual Wellness questionnaire and have noted the following in the  patient's chart:  A. Medical and social history B. Use of alcohol, tobacco or illicit drugs  C. Current medications and supplements D. Functional ability and status E.  Nutritional status F.  Physical activity G. Advance directives H. List of other physicians I.  Hospitalizations, surgeries, and ER visits in previous 12 months J.  [place] to include hearing, vision, cognitive, depression L. Referrals and appointments - none  In addition, I have reviewed and discussed with patient certain preventive protocols, quality metrics, and best practice recommendations. A written personalized care plan  for preventive services as well as general preventive health recommendations were provided to patient.  See attached scanned questionnaire for additional information.   Signed,   Rich Reining, RN Nurse Health Advisor   Quick Notes   Health Maintenance: Flu vaccine and tdap due     Abnormal Screen: 6 CIT-11     Patient Concerns: None     Nurse Concerns: None

## 2016-11-10 NOTE — Patient Instructions (Signed)
Mr. Patrick [name] , Thank you for taking time to come for your Medicare Wellness Visit. I appreciate your ongoing commitment to your health goals. Please review the following plan we discussed and let me know if I can assist you in the future.   Screening recommendations/referrals: Colonoscopy excluded, pt over age 81 Recommended yearly ophthalmology/optometry visit for glaucoma screening and checkup Recommended yearly dental visit for hygiene and checkup  Vaccinations: Influenza vaccine due Pneumococcal vaccine up to date Tdap vaccine due Shingles vaccine not in records    Advanced directives: DNR in chart, copies of health care power of attorney and living will are needed   Conditions/risks identified: none  Next appointment: Dr. Lyndel Safe makes rounds  Preventive Care 25 Years and Older, Male Preventive care refers to lifestyle choices and visits with your health care provider that can promote health and wellness. What does preventive care include?  A yearly physical exam. This is also called an annual well check.  Dental exams once or twice a year.  Routine eye exams. Ask your health care provider how often you should have your eyes checked.  Personal lifestyle choices, including:  Daily care of your teeth and gums.  Regular physical activity.  Eating a healthy diet.  Avoiding tobacco and drug use.  Limiting alcohol use.  Practicing safe sex.  Taking low doses of aspirin every day.  Taking vitamin and mineral supplements as recommended by your health care provider. What happens during an annual well check? The services and screenings done by your health care provider during your annual well check will depend on your age, overall health, lifestyle risk factors, and family history of disease. Counseling  Your health care provider may ask you questions about your:  Alcohol use.  Tobacco use.  Drug use.  Emotional well-being.  Home and relationship  well-being.  Sexual activity.  Eating habits.  History of falls.  Memory and ability to understand (cognition).  Work and work Statistician. Screening  You may have the following tests or measurements:  Height, weight, and BMI.  Blood pressure.  Lipid and cholesterol levels. These may be checked every 5 years, or more frequently if you are over 12 years old.  Skin check.  Lung cancer screening. You may have this screening every year starting at age 35 if you have a 30-pack-year history of smoking and currently smoke or have quit within the past 15 years.  Fecal occult blood test (FOBT) of the stool. You may have this test every year starting at age 66.  Flexible sigmoidoscopy or colonoscopy. You may have a sigmoidoscopy every 5 years or a colonoscopy every 10 years starting at age 64.  Prostate cancer screening. Recommendations will vary depending on your family history and other risks.  Hepatitis C blood test.  Hepatitis B blood test.  Sexually transmitted disease (STD) testing.  Diabetes screening. This is done by checking your blood sugar (glucose) after you have not eaten for a while (fasting). You may have this done every 1-3 years.  Abdominal aortic aneurysm (AAA) screening. You may need this if you are a current or former smoker.  Osteoporosis. You may be screened starting at age 43 if you are at high risk. Talk with your health care provider about your test results, treatment options, and if necessary, the need for more tests. Vaccines  Your health care provider may recommend certain vaccines, such as:  Influenza vaccine. This is recommended every year.  Tetanus, diphtheria, and acellular pertussis (Tdap, Td) vaccine. You  may need a Td booster every 10 years.  Zoster vaccine. You may need this after age 82.  Pneumococcal 13-valent conjugate (PCV13) vaccine. One dose is recommended after age 79.  Pneumococcal polysaccharide (PPSV23) vaccine. One dose is  recommended after age 81. Talk to your health care provider about which screenings and vaccines you need and how often you need them. This information is not intended to replace advice given to you by your health care provider. Make sure you discuss any questions you have with your health care provider. Document Released: 02/27/2015 Document Revised: 10/21/2015 Document Reviewed: 12/02/2014 Elsevier Interactive Patient Education  2017 [place] Prevention in the Home Falls can cause injuries. They can happen to people of all ages. There are many things you can do to make your home safe and to help prevent falls. What can I do on the outside of my home?  Regularly fix the edges of walkways and driveways and fix any cracks.  Remove anything that might make you trip as you walk through a door, such as a raised step or threshold.  Trim any bushes or trees on the path to your home.  Use bright outdoor lighting.  Clear any walking paths of anything that might make someone trip, such as rocks or tools.  Regularly check to see if handrails are loose or broken. Make sure that both sides of any steps have handrails.  Any raised decks and porches should have guardrails on the edges.  Have any leaves, snow, or ice cleared regularly.  Use sand or salt on walking paths during winter.  Clean up any spills in your garage right away. This includes oil or grease spills. What can I do in the bathroom?  Use night lights.  Install grab bars by the toilet and in the tub and shower. Do not use towel bars as grab bars.  Use non-skid mats or decals in the tub or shower.  If you need to sit down in the shower, use a plastic, non-slip stool.  Keep the floor dry. Clean up any water that spills on the floor as soon as it happens.  Remove soap buildup in the tub or shower regularly.  Attach bath mats securely with double-sided non-slip rug tape.  Do not have throw rugs and other things on  the floor that can make you trip. What can I do in the bedroom?  Use night lights.  Make sure that you have a light by your bed that is easy to reach.  Do not use any sheets or blankets that are too big for your bed. They should not hang down onto the floor.  Have a firm chair that has side arms. You can use this for support while you get dressed.  Do not have throw rugs and other things on the floor that can make you trip. What can I do in the kitchen?  Clean up any spills right away.  Avoid walking on wet floors.  Keep items that you use a lot in easy-to-reach places.  If you need to reach something above you, use a strong step stool that has a grab bar.  Keep electrical cords out of the way.  Do not use floor polish or wax that makes floors slippery. If you must use wax, use non-skid floor wax.  Do not have throw rugs and other things on the floor that can make you trip. What can I do with my stairs?  Do not leave any items  on the stairs.  Make sure that there are handrails on both sides of the stairs and use them. Fix handrails that are broken or loose. Make sure that handrails are as long as the stairways.  Check any carpeting to make sure that it is firmly attached to the stairs. Fix any carpet that is loose or worn.  Avoid having throw rugs at the top or bottom of the stairs. If you do have throw rugs, attach them to the floor with carpet tape.  Make sure that you have a light switch at the top of the stairs and the bottom of the stairs. If you do not have them, ask someone to add them for you. What else can I do to help prevent falls?  Wear shoes that:  Do not have high heels.  Have rubber bottoms.  Are comfortable and fit you well.  Are closed at the toe. Do not wear sandals.  If you use a stepladder:  Make sure that it is fully opened. Do not climb a closed stepladder.  Make sure that both sides of the stepladder are locked into place.  Ask someone to  hold it for you, if possible.  Clearly mark and make sure that you can see:  Any grab bars or handrails.  First and last steps.  Where the edge of each step is.  Use tools that help you move around (mobility aids) if they are needed. These include:  Canes.  Walkers.  Scooters.  Crutches.  Turn on the lights when you go into a dark area. Replace any light bulbs as soon as they burn out.  Set up your furniture so you have a clear path. Avoid moving your furniture around.  If any of your floors are uneven, fix them.  If there are any pets around you, be aware of where they are.  Review your medicines with your doctor. Some medicines can make you feel dizzy. This can increase your chance of falling. Ask your doctor what other things that you can do to help prevent falls. This information is not intended to replace advice given to you by your health care provider. Make sure you discuss any questions you have with your health care provider. Document Released: 11/27/2008 Document Revised: 07/09/2015 Document Reviewed: 03/07/2014 Elsevier Interactive Patient Education  2017 [employer].

## 2016-11-28 ENCOUNTER — Non-Acute Institutional Stay (SKILLED_NURSING_FACILITY): Payer: Medicare Other | Admitting: Internal Medicine

## 2016-11-28 ENCOUNTER — Encounter: Payer: Self-pay | Admitting: Internal Medicine

## 2016-11-28 DIAGNOSIS — I509 Heart failure, unspecified: Secondary | ICD-10-CM

## 2016-11-28 DIAGNOSIS — M1712 Unilateral primary osteoarthritis, left knee: Secondary | ICD-10-CM

## 2016-11-28 DIAGNOSIS — D638 Anemia in other chronic diseases classified elsewhere: Secondary | ICD-10-CM

## 2016-11-28 DIAGNOSIS — H6123 Impacted cerumen, bilateral: Secondary | ICD-10-CM | POA: Diagnosis not present

## 2016-11-28 NOTE — Progress Notes (Signed)
Location:   [facility] Room Number: 139/D Place of Service:  SNF (31) Provider:  [place] Lewis  Patient, [name]  Patient Care Team: Patient, [name] as PCP - General (General Practice) Patrick Binder, Patrick [name] as Consulting Physician (Gastroenterology) [name], Patrick [name] as Consulting Physician [facility])  Extended Emergency Contact Information Primary Emergency Contact: Black,Shirley Address: [address]          [place], [place] [address] [name] of [place] Phone: [phone] Mobile Phone: [phone] Relation: Daughter  Code Status:  DNR Goals of care: Advanced Directive information Advanced Directives 11/28/2016  Does Patient Have a Medical Advance Directive? Yes  Type of Advance Directive Out of facility DNR (pink MOST or yellow form)  Does patient want to make changes to medical advance directive? No - Patient declined  Copy of [place] in Chart? -  Pre-existing out of facility DNR order (yellow form or pink MOST form) -     Chief Complaint  Patient presents with  . Acute Visit    Ear Wax    HPI:  Pt is a 83 y.o. male seen today for an acute visit for Complaints of difficulty hearing thinking it's due to earwax.  He does not complaining of any ear pain otherwise has no complaints.  Says his ears do feel plugged and he has difficulty talking on the telephone with friends and family.  His other issues appear to be stable however does have history of CHF she is no longer on Lasix nonetheless edema and clinical status have been stable has had some moderate weight gain but suspect this is appetite related.  Regards to anemia this is of chronic disease hemoglobin was 10.5 back in July we will update this at this is been stable for some time as well.  He does have significant osteoarthritis seizure-like in a twice a day routine and when necessary every 6 hours apparently this is effective.     Past Medical  History:  Diagnosis Date  . Anxiety   . Bilateral foot pain   . CHF (congestive heart failure) ([place])   . Diverticulitis   . DJD (degenerative joint disease), cervical   . DNR (do not resuscitate)   . Esophageal dysmotility    age related per BPE 04/2015  . Esophageal stricture   . GERD (gastroesophageal reflux disease)   . Gout   . Hiatal hernia   . History of recurrent TIAs   . HTN (hypertension)   . Hyperlipidemia   . Inguinal hernia   . Ischemic heart disease   . Kidney stone   . Osteoarthritis    bilat knees  . Pleural effusion   . Reflux   . Restless leg syndrome   . Right hip pain   . Skin cancer, basal cell   . Urinary retention    Past Surgical History:  Procedure Laterality Date  . ESOPHAGEAL DILATION    . EXPLORATORY LAPAROTOMY W/ BOWEL RESECTION    . FRACTURE SURGERY    . HEMIARTHROPLASTY HIP     Dr. Aline Brochure  . intestines      Allergies  Allergen Reactions  . Celebrex [Celecoxib] Other (See Comments)    Unknown; patient can't remember  . Codeine Other (See Comments)    nausea    Outpatient Encounter Prescriptions as of 11/28/2016  Medication Sig  . aspirin EC 81 MG tablet Take 81 mg by mouth daily.  Marland Kitchen CAMPHOR-EUCALYPTUS-MENTHOL EX Apply 1  application topically 2 (two) times daily as needed.  . carbamide peroxide (DEBROX) 6.5 % OTIC solution Place 5 drops into both ears 2 (two) times daily. X 3 days  . Cholecalciferol (VITAMIN D) 2000 UNITS CAPS Take 1 capsule by mouth daily.  Marland Kitchen docusate sodium (COLACE) 100 MG capsule Take 1 capsule (100 mg total) by mouth 2 (two) times daily. For constipation.  Marland Kitchen guaiFENesin (MUCINEX) 600 MG 12 hr tablet Take 600 mg by mouth 2 (two) times daily.  Marland Kitchen guaifenesin (ROBITUSSIN) 100 MG/5ML syrup Take 300 mg by mouth every 6 (six) hours as needed for cough or congestion.   Marland Kitchen HYDROcodone-acetaminophen (NORCO/VICODIN) 5-325 MG tablet Take one tablet by mouth twice daily and one tablet by mouth every 6 hours as needed for  pain. DNE 3gm of APAP/24hrs  . ipratropium-albuterol (DUONEB) 0.5-2.5 (3) MG/3ML SOLN Take 3 mLs by nebulization every 6 (six) hours. And additional 3 ml every 6 hours prn  . magnesium hydroxide (MILK OF MAGNESIA) 400 MG/5ML suspension Take 30 mLs by mouth daily as needed for mild constipation. If no relief try a Fleets enema, after 2 days.  . Menthol 4.8 MG LOZG Take 1 by mouth every 4 hours prn  . Menthol, Topical Analgesic, (BIOFREEZE) 4 % GEL Apply small to back as needed for pain  . omeprazole (PRILOSEC) 20 MG capsule Take 20 mg by mouth daily.  Marland Kitchen Propylene Glycol 0.6 % SOLN Apply 1 drop to both eyes twice a day  . rOPINIRole (REQUIP) 2 MG tablet Take 2 mg by mouth at bedtime.  . sertraline (ZOLOFT) 25 MG tablet Take 37.5 mg by mouth daily.   . [DISCONTINUED] clotrimazole (LOTRIMIN) 1 % cream Apply 1 application topically 2 (two) times daily.   No facility-administered encounter medications on file as of 11/28/2016.     Review of Systems   General does not complaining fever or chills says he feels well.  Skin does not complain of rashes or itching has history of numerous skin cancers which have been followed by dermatology and removed.  Head ears eyes nose mouth and throat does not complain of visual changes has prescription lenses does complain of difficulty hearing both ears denies ear pain.  Respiration does not complaining of any shortness breath or cough.  Cardiac denies chest pain has minimal lower extremity edema.  GI does not complain of abdominal discomfort nausea vomiting diarrhea constipation.  GU has a chronic indwelling Foley catheter which he has tolerated well does not complain of dysuria.  Muscle skeletal has his stated generalized osteoarthritis but pain as noted above appears to be controlled.  Neurologic does not complain of dizziness headache syncope has a history of restless legs is on Requip apparently with an official effect.  Psych he is not complaining  of any overt anxiety or depression he is been very stable in this regards he is on Zoloft  Immunization History  Administered Date(s) Administered  . Influenza,inj,Quad PF,6+ Mos 12/07/2014  . Influenza-Unspecified 11/20/2013, 11/13/2015  . PPD Test 03/16/2013  . Pneumococcal-Unspecified 02/14/2009, 11/25/2015   Pertinent  Health Maintenance Due  Topic Date Due  . INFLUENZA VACCINE  01/14/2017 (Originally 09/14/2016)  . PNA vac Low Risk Adult (2 of 2 - PCV13) 01/14/2017 (Originally 11/24/2016)   Fall Risk  11/10/2016  Falls in the past year? No   Functional Status Survey:     He is afebrile pulse of 92 respirations 18 blood pressure taken manually was 120/64 weight is 170 is appears relatively stable  the past 2 months Physical Exam In general this is a very pleasant elderly male in no distress sitting comfortably in his wheelchair.  His skin is warm and dry does have numerous areas were skin cancers have been removed.  Ears he does have significant wax buildup in both ears bilaterally I was able to visualize the tympanic membrane slightly on the right ear--however he has significant wax in both ears I do not note any drainage or bleeding.  Chest is clear to auscultation with somewhat shallow air entry there is no labored breathing.  Heart is distant heart sounds regular rate and rhythm cannot appreciate a murmur gallop or rub he has scant lower extremity edema.  His abdomen is protuberant soft nontender he does have a large right-sided abdominal hernia which is baseline bowel sounds are positive.  GU has an indwelling Foley catheter draining amber colored urine.  Musculoskeletal continues to ambulate in a wheelchair at baseline moves all studies 4 with general frailty and arthritic changes of his lower extremities most noticeable.  Neurologic is grossly intact to speech is clear no lateralizing findings.  Psych he is grossly alert and oriented very pleasant and  appropriate- Labs reviewed:  Recent Labs  07/18/16 0734 08/18/16 0600 09/05/16 0600  NA 135 135 136  K 4.7 4.2 4.1  CL 100* 100* 102  CO2 28 29 27   GLUCOSE 90 91 94  BUN 12 14 14   CREATININE 1.29* 0.99 0.97  CALCIUM 8.5* 8.7* 8.7*    Recent Labs  04/13/16 2220 04/14/16 0544  AST 23 17  ALT 19 16*  ALKPHOS 80 75  BILITOT 0.6 0.7  PROT 7.0 6.8  ALBUMIN 2.8* 2.6*    Recent Labs  04/04/16 0700 04/13/16 2220  05/25/16 0700 07/14/16 0740 09/05/16 0600  WBC 5.6 15.7*  < > 7.4 6.5 6.9  NEUTROABS 3.1 12.5*  --   --  3.7  --   HGB 10.6* 10.3*  < > 11.4* 11.2* 10.5*  HCT 31.6* 31.4*  < > 34.7* 34.8* 31.9*  MCV 82.7 82.4  < > 84.4 86.4 87.4  PLT 154 358  < > 195 233 146*  < > = values in this interval not displayed. Lab Results  Component Value Date   TSH 1.323 04/13/2016   Lab Results  Component Value Date   HGBA1C 6.1 (H) 11/12/2014   No results found for: CHOL, HDL, LDLCALC, LDLDIRECT, TRIG, CHOLHDL  Significant Diagnostic Results in last 30 days:  No results found.  Assessment/Plan  #1-history of wax buildup ears bilaterally Will treat with D Brox eyedrops twice a day for 3 days and flush with warm water on day 4 he may need more than one course since he has fairly extensive buildup but will await initial treatment.  #2 anemia of chronic disease hemoglobin has been stable per chart review shows the 10.5 back in July he is due for an update will update his CBC.  #3 history CHF this has been stable for some time despite being on the Lasix his weight has been stable past couple months he's had some slight weight gain previous to that but I suspect this was more better by mouth intake-lung exam was benign at this point will continue to monitor.  #4 osteoarthritis again he is on the Vicodin this appears to be helping routinely and when necessary   [phone]

## 2016-11-29 ENCOUNTER — Encounter (HOSPITAL_COMMUNITY)
Admission: RE | Admit: 2016-11-29 | Discharge: 2016-11-29 | Disposition: A | Discharge status: Home / Self Care | Payer: Medicare Other | Source: Skilled Nursing Facility | Attending: Internal Medicine | Admitting: Internal Medicine

## 2016-11-29 DIAGNOSIS — I259 Chronic ischemic heart disease, unspecified: Secondary | ICD-10-CM | POA: Diagnosis not present

## 2016-11-29 LAB — BASIC METABOLIC PANEL
Anion gap: 9 (ref 5–15)
BUN: 17 mg/dL (ref 6–20)
CHLORIDE: 101 mmol/L (ref 101–111)
CO2: 26 mmol/L (ref 22–32)
Calcium: 8.5 mg/dL — ABNORMAL LOW (ref 8.9–10.3)
Creatinine, Ser: 1.15 mg/dL (ref 0.61–1.24)
GFR calc non Af Amer: 50 mL/min — ABNORMAL LOW (ref >60)
GFR, EST AFRICAN AMERICAN: 58 mL/min — AB (ref >60)
Glucose, Bld: 104 mg/dL — ABNORMAL HIGH (ref 65–99)
POTASSIUM: 4.2 mmol/L (ref 3.5–5.1)
SODIUM: 136 mmol/L (ref 135–145)

## 2016-11-29 LAB — CBC WITH DIFFERENTIAL/PLATELET
BASOS ABS: 0 10*3/uL (ref 0.0–0.1)
BASOS PCT: 0 %
EOS ABS: 0.3 10*3/uL (ref 0.0–0.7)
Eosinophils Relative: 4 %
HCT: 34.7 % — ABNORMAL LOW (ref 39.0–52.0)
HEMOGLOBIN: 11.2 g/dL — AB (ref 13.0–17.0)
LYMPHS ABS: 1.9 10*3/uL (ref 0.7–4.0)
Lymphocytes Relative: 25 %
MCH: 28.1 pg (ref 26.0–34.0)
MCHC: 32.3 g/dL (ref 30.0–36.0)
MCV: 87 fL (ref 78.0–100.0)
Monocytes Absolute: 1 10*3/uL (ref 0.1–1.0)
Monocytes Relative: 13 %
NEUTROS PCT: 58 %
Neutro Abs: 4.3 10*3/uL (ref 1.7–7.7)
Platelets: 247 10*3/uL (ref 150–400)
RBC: 3.99 MIL/uL — AB (ref 4.22–5.81)
RDW: 14.1 % (ref 11.5–15.5)
WBC: 7.4 10*3/uL (ref 4.0–10.5)

## 2016-12-06 DIAGNOSIS — H04123 Dry eye syndrome of bilateral lacrimal glands: Secondary | ICD-10-CM | POA: Diagnosis not present

## 2016-12-06 DIAGNOSIS — D3132 Benign neoplasm of left choroid: Secondary | ICD-10-CM | POA: Diagnosis not present

## 2016-12-06 DIAGNOSIS — H26493 Other secondary cataract, bilateral: Secondary | ICD-10-CM | POA: Diagnosis not present

## 2016-12-06 DIAGNOSIS — Z961 Presence of intraocular lens: Secondary | ICD-10-CM | POA: Diagnosis not present

## 2016-12-07 ENCOUNTER — Other Ambulatory Visit: Payer: Self-pay

## 2016-12-07 MED ORDER — HYDROCODONE-ACETAMINOPHEN 5-325 MG PO TABS: ORAL_TABLET | ORAL | 0 refills | Status: DC

## 2016-12-07 NOTE — Telephone Encounter (Signed)
RX Fax for [facility]@ [phone]  

## 2016-12-12 ENCOUNTER — Encounter: Payer: Self-pay | Admitting: Internal Medicine

## 2016-12-12 ENCOUNTER — Non-Acute Institutional Stay (SKILLED_NURSING_FACILITY): Payer: Medicare Other | Admitting: Internal Medicine

## 2016-12-12 DIAGNOSIS — K219 Gastro-esophageal reflux disease without esophagitis: Secondary | ICD-10-CM | POA: Diagnosis not present

## 2016-12-12 DIAGNOSIS — G2581 Restless legs syndrome: Secondary | ICD-10-CM | POA: Diagnosis not present

## 2016-12-12 DIAGNOSIS — I739 Peripheral vascular disease, unspecified: Secondary | ICD-10-CM

## 2016-12-12 DIAGNOSIS — H60503 Unspecified acute noninfective otitis externa, bilateral: Secondary | ICD-10-CM

## 2016-12-12 DIAGNOSIS — R1310 Dysphagia, unspecified: Secondary | ICD-10-CM | POA: Diagnosis not present

## 2016-12-12 DIAGNOSIS — M150 Primary generalized (osteo)arthritis: Secondary | ICD-10-CM | POA: Diagnosis not present

## 2016-12-12 DIAGNOSIS — F32A Depression, unspecified: Secondary | ICD-10-CM

## 2016-12-12 DIAGNOSIS — M159 Polyosteoarthritis, unspecified: Secondary | ICD-10-CM

## 2016-12-12 DIAGNOSIS — F329 Major depressive disorder, single episode, unspecified: Secondary | ICD-10-CM | POA: Diagnosis not present

## 2016-12-12 DIAGNOSIS — N401 Enlarged prostate with lower urinary tract symptoms: Secondary | ICD-10-CM | POA: Diagnosis not present

## 2016-12-12 DIAGNOSIS — D509 Iron deficiency anemia, unspecified: Secondary | ICD-10-CM

## 2016-12-12 NOTE — Progress Notes (Signed)
[facility] Room Number: 139/D Place of Service:  SNF (31) Provider:    Patient, [name]  Patient Care Team: Patient, [name], Marga Melnick, MD as Consulting Physician (Gastroenterology) [name], MD as Consulting Physician [facility])  Extended Emergency Contact Information Primary Emergency Contact: Black,Shirley Address: [address]          [place], [place] [address] [name] of [place] Phone: [phone] Mobile Phone: [phone] Relation: Daughter  Code Status:  DNR Goals of care: Advanced Directive information Advanced Directives 12/12/2016  Does Patient Have a Medical Advance Directive? Yes  Type of Advance Directive Out of facility DNR (pink MOST or yellow form)  Does patient want to make changes to medical advance directive? No - Patient declined  Copy of [place] in Chart? -  Pre-existing out of facility DNR order (yellow form or pink MOST form) -     Chief Complaint  Patient presents with  . Medical Management of Chronic Issues    Routine Visit  . Acute Visit    Left Ear problem    HPI:  Pt is a 83 y.o. male seen today for medical management of chronic diseases.    Patient Is Long term resident of facility and has h/o HTN, CHF, Depression, Arthritis, Anemia, GERD, Chronic Foley Cathter due to Urinary retention.PVD ABI in 2015 refused Aggressive approach. Aspiration Pneumonia and Dysphagia  Patient is long term Resident of Nursing Home. He has been doing well  And has not had any new Nursing issues. He was seen for Ear wax and had his Ears flushed. Then he noticed his ear was hurting and some blood came out of both his ears. Patient otherwise had no new complains. His weight is again up to 170 lbs which is up from 169 lbs. He is on Dysphagia 3 Diet.  Past Medical History:  Diagnosis Date  . Anxiety   . Bilateral foot pain   . CHF (congestive heart failure)  ([place])   . Diverticulitis   . DJD (degenerative joint disease), cervical   . DNR (do not resuscitate)   . Esophageal dysmotility    age related per BPE 04/2015  . Esophageal stricture   . GERD (gastroesophageal reflux disease)   . Gout   . Hiatal hernia   . History of recurrent TIAs   . HTN (hypertension)   . Hyperlipidemia   . Inguinal hernia   . Ischemic heart disease   . Kidney stone   . Osteoarthritis    bilat knees  . Pleural effusion   . Reflux   . Restless leg syndrome   . Right hip pain   . Skin cancer, basal cell   . Urinary retention    Past Surgical History:  Procedure Laterality Date  . ESOPHAGEAL DILATION    . EXPLORATORY LAPAROTOMY W/ BOWEL RESECTION    . FRACTURE SURGERY    . HEMIARTHROPLASTY HIP     Dr. Aline Brochure  . intestines      Allergies  Allergen Reactions  . Celebrex [Celecoxib] Other (See Comments)    Unknown; patient can't remember  . Codeine Other (See Comments)    nausea    Outpatient Encounter Prescriptions as of 12/12/2016  Medication Sig  . aspirin EC 81 MG tablet Take 81 mg by mouth daily.  . Cholecalciferol (VITAMIN D) 2000 UNITS CAPS Take 1 capsule by mouth daily.  Marland Kitchen docusate sodium (COLACE)  100 MG capsule Take 1 capsule (100 mg total) by mouth 2 (two) times daily. For constipation.  Marland Kitchen guaiFENesin (MUCINEX) 600 MG 12 hr tablet Take 600 mg by mouth 2 (two) times daily.  Marland Kitchen HYDROcodone-acetaminophen (NORCO/VICODIN) 5-325 MG tablet Take one tablet by mouth twice daily and one tablet by mouth every 6 hours as needed for pain. DNE 3gm of APAP/24hrs  . magnesium hydroxide (MILK OF MAGNESIA) 400 MG/5ML suspension Take 30 mLs by mouth daily as needed for mild constipation. If no relief try a Fleets enema, after 2 days.  . Menthol, Topical Analgesic, (BIOFREEZE) 4 % GEL Apply small to back as needed for pain  . omeprazole (PRILOSEC) 20 MG capsule Take 20 mg by mouth daily.  Marland Kitchen Propylene Glycol 0.6 % SOLN Apply 1 drop to both eyes twice a day  .  rOPINIRole (REQUIP) 2 MG tablet Take 2 mg by mouth at bedtime.  . sertraline (ZOLOFT) 25 MG tablet Take 37.5 mg by mouth daily.   . [DISCONTINUED] CAMPHOR-EUCALYPTUS-MENTHOL EX Apply 1 application topically 2 (two) times daily as needed.  . [DISCONTINUED] carbamide peroxide (DEBROX) 6.5 % OTIC solution Place 5 drops into both ears 2 (two) times daily. X 3 days  . [DISCONTINUED] guaifenesin (ROBITUSSIN) 100 MG/5ML syrup Take 300 mg by mouth every 6 (six) hours as needed for cough or congestion.   . [DISCONTINUED] ipratropium-albuterol (DUONEB) 0.5-2.5 (3) MG/3ML SOLN Take 3 mLs by nebulization every 6 (six) hours. And additional 3 ml every 6 hours prn  . [DISCONTINUED] Menthol 4.8 MG LOZG Take 1 by mouth every 4 hours prn   No facility-administered encounter medications on file as of 12/12/2016.      Review of Systems  Review of Systems  Constitutional: Negative for activity change, appetite change, chills, diaphoresis, fatigue and fever.  HENT: Negative for mouth sores, postnasal drip, rhinorrhea, sinus pain and sore throat.   Respiratory: Negative for apnea, cough, chest tightness, shortness of breath and wheezing.   Cardiovascular: Negative for chest pain, palpitations and leg swelling.  Gastrointestinal: Negative for abdominal distention, abdominal pain, constipation, diarrhea, nausea and vomiting.  Genitourinary: Negative for dysuria and frequency.  Musculoskeletal: Negative for arthralgias, joint swelling and myalgias.  Skin: Negative for rash.  Neurological: Negative for dizziness, syncope, weakness, light-headedness and numbness.  Psychiatric/Behavioral: Negative for behavioral problems, confusion and sleep disturbance.     Immunization History  Administered Date(s) Administered  . Influenza,inj,Quad PF,6+ Mos 12/07/2014  . Influenza-Unspecified 11/20/2013, 11/13/2015  . PPD Test 03/16/2013  . Pneumococcal-Unspecified 02/14/2009, 11/25/2015   Pertinent  Health Maintenance Due   Topic Date Due  . INFLUENZA VACCINE  01/14/2017 (Originally 09/14/2016)  . PNA vac Low Risk Adult (2 of 2 - PCV13) 01/14/2017 (Originally 11/24/2016)   Fall Risk  11/10/2016  Falls in the past year? No   Functional Status Survey:    Vitals:   12/12/16 1042  BP: 120/71  Pulse: 80  Resp: (!) 22  Temp: 97.8 F (36.6 C)  TempSrc: Oral   There is no height or weight on file to calculate BMI. Physical Exam  Constitutional: He is oriented to person, place, and time. He appears well-developed and well-nourished.  HENT:  Head: Normocephalic.  Mouth/Throat: Oropharynx is clear and moist.  Had Dried Blood in Both Ears.  Eyes: Pupils are equal, round, and reactive to light.  Neck: Neck supple.  Cardiovascular: Normal rate and normal heart sounds.   Pulmonary/Chest: Effort normal and breath sounds normal. No respiratory distress. He has no  wheezes. He has no rales.  Abdominal: Soft. Bowel sounds are normal. He exhibits no distension. There is no tenderness. There is no rebound.  Musculoskeletal:  Trace edema Bilateral  Neurological: He is alert and oriented to person, place, and time.  Skin: Skin is warm and dry.    Labs reviewed:  Recent Labs  08/18/16 0600 09/05/16 0600 11/29/16 0730  NA 135 136 136  K 4.2 4.1 4.2  CL 100* 102 101  CO2 29 27 26   GLUCOSE 91 94 104*  BUN 14 14 17   CREATININE 0.99 0.97 1.15  CALCIUM 8.7* 8.7* 8.5*    Recent Labs  04/13/16 2220 04/14/16 0544  AST 23 17  ALT 19 16*  ALKPHOS 80 75  BILITOT 0.6 0.7  PROT 7.0 6.8  ALBUMIN 2.8* 2.6*    Recent Labs  04/13/16 2220  07/14/16 0740 09/05/16 0600 11/29/16 0730  WBC 15.7*  < > 6.5 6.9 7.4  NEUTROABS 12.5*  --  3.7  --  4.3  HGB 10.3*  < > 11.2* 10.5* 11.2*  HCT 31.4*  < > 34.8* 31.9* 34.7*  MCV 82.4  < > 86.4 87.4 87.0  PLT 358  < > 233 146* 247  < > = values in this interval not displayed. Lab Results  Component Value Date   TSH 1.323 04/13/2016   Lab Results  Component Value  Date   HGBA1C 6.1 (H) 11/12/2014   No results found for: CHOL, HDL, LDLCALC, LDLDIRECT, TRIG, CHOLHDL  Significant Diagnostic Results in last 30 days:  No results found.  Assessment/Plan Acute otitis externa of both ears Most Likely due to flushing His ear for wax Will start him On Cipro Ear drops for few days.  Peripheral vascular disease  No further work up as per patient Request  Dysphagia On Modified diet Doing well. Weight stable.    osteoarthritis involving multiple joints Pain Controlled on hydrocodone  GERD Stable on Prilosec  BPH with Chronic Foley No Problems. Tolerating it well.  Iron deficiency anemia Hgb Stable  Restless legs On Requip  Depression Doing well On Zoloft   Family/ staff Communication:   Labs/tests ordered:    Total time spent in this patient care encounter was 25_ minutes; greater than 50% of the visit spent counseling patient, reviewing records , Labs and coordinating care for problems addressed at this encounter.

## 2016-12-12 NOTE — Progress Notes (Deleted)
Location:   [place] Room Number: 139/D Place of Service:  SNF (31) Provider:  Anjali,Gupta  Patient, [name]  Patient Care Team: Patient, [name], Marga Melnick, MD as Consulting Physician (Gastroenterology) [name], MD as Consulting Physician [facility])  Extended Emergency Contact Information Primary Emergency Contact: Black,Shirley Address: [address]          [place], [place] [address] [name] of [place] Phone: [phone] Mobile Phone: [phone] Relation: Daughter  Code Status:  DNR Goals of care: Advanced Directive information Advanced Directives 12/12/2016  Does Patient Have a Medical Advance Directive? Yes  Type of Advance Directive Out of facility DNR (pink MOST or yellow form)  Does patient want to make changes to medical advance directive? No - Patient declined  Copy of [place] in Chart? -  Pre-existing out of facility DNR order (yellow form or pink MOST form) -     Chief Complaint  Patient presents with  . Acute Visit    Left ear problem    HPI:  Pt is a 83 y.o. male seen today for an acute visit for    Past Medical History:  Diagnosis Date  . Anxiety   . Bilateral foot pain   . CHF (congestive heart failure) ([place])   . Diverticulitis   . DJD (degenerative joint disease), cervical   . DNR (do not resuscitate)   . Esophageal dysmotility    age related per BPE 04/2015  . Esophageal stricture   . GERD (gastroesophageal reflux disease)   . Gout   . Hiatal hernia   . History of recurrent TIAs   . HTN (hypertension)   . Hyperlipidemia   . Inguinal hernia   . Ischemic heart disease   . Kidney stone   . Osteoarthritis    bilat knees  . Pleural effusion   . Reflux   . Restless leg syndrome   . Right hip pain   . Skin cancer, basal cell   . Urinary retention    Past Surgical History:  Procedure Laterality Date  . ESOPHAGEAL DILATION      . EXPLORATORY LAPAROTOMY W/ BOWEL RESECTION    . FRACTURE SURGERY    . HEMIARTHROPLASTY HIP     Dr. Aline Brochure  . intestines      Allergies  Allergen Reactions  . Celebrex [Celecoxib] Other (See Comments)    Unknown; patient can't remember  . Codeine Other (See Comments)    nausea    Outpatient Encounter Prescriptions as of 12/12/2016  Medication Sig  . aspirin EC 81 MG tablet Take 81 mg by mouth daily.  . Cholecalciferol (VITAMIN D) 2000 UNITS CAPS Take 1 capsule by mouth daily.  Marland Kitchen docusate sodium (COLACE) 100 MG capsule Take 1 capsule (100 mg total) by mouth 2 (two) times daily. For constipation.  Marland Kitchen guaiFENesin (MUCINEX) 600 MG 12 hr tablet Take 600 mg by mouth 2 (two) times daily.  Marland Kitchen HYDROcodone-acetaminophen (NORCO/VICODIN) 5-325 MG tablet Take one tablet by mouth twice daily and one tablet by mouth every 6 hours as needed for pain. DNE 3gm of APAP/24hrs  . magnesium hydroxide (MILK OF MAGNESIA) 400 MG/5ML suspension Take 30 mLs by mouth daily as needed for mild constipation. If no relief try a Fleets enema, after 2 days.  . Menthol, Topical Analgesic, (BIOFREEZE) 4 % GEL Apply small to back as needed for pain  . omeprazole (PRILOSEC) 20 MG capsule  Take 20 mg by mouth daily.  Marland Kitchen Propylene Glycol 0.6 % SOLN Apply 1 drop to both eyes twice a day  . rOPINIRole (REQUIP) 2 MG tablet Take 2 mg by mouth at bedtime.  . sertraline (ZOLOFT) 25 MG tablet Take 37.5 mg by mouth daily.   . [DISCONTINUED] CAMPHOR-EUCALYPTUS-MENTHOL EX Apply 1 application topically 2 (two) times daily as needed.  . [DISCONTINUED] carbamide peroxide (DEBROX) 6.5 % OTIC solution Place 5 drops into both ears 2 (two) times daily. X 3 days  . [DISCONTINUED] guaifenesin (ROBITUSSIN) 100 MG/5ML syrup Take 300 mg by mouth every 6 (six) hours as needed for cough or congestion.   . [DISCONTINUED] ipratropium-albuterol (DUONEB) 0.5-2.5 (3) MG/3ML SOLN Take 3 mLs by nebulization every 6 (six) hours. And additional 3 ml every 6  hours prn  . [DISCONTINUED] Menthol 4.8 MG LOZG Take 1 by mouth every 4 hours prn   No facility-administered encounter medications on file as of 12/12/2016.      Review of Systems  Immunization History  Administered Date(s) Administered  . Influenza,inj,Quad PF,6+ Mos 12/07/2014  . Influenza-Unspecified 11/20/2013, 11/13/2015  . PPD Test 03/16/2013  . Pneumococcal-Unspecified 02/14/2009, 11/25/2015   Pertinent  Health Maintenance Due  Topic Date Due  . INFLUENZA VACCINE  01/14/2017 (Originally 09/14/2016)  . PNA vac Low Risk Adult (2 of 2 - PCV13) 01/14/2017 (Originally 11/24/2016)   Fall Risk  11/10/2016  Falls in the past year? No   Functional Status Survey:    There were no vitals filed for this visit. There is no height or weight on file to calculate BMI. Physical Exam  Labs reviewed:  Recent Labs  08/18/16 0600 09/05/16 0600 11/29/16 0730  NA 135 136 136  K 4.2 4.1 4.2  CL 100* 102 101  CO2 29 27 26   GLUCOSE 91 94 104*  BUN 14 14 17   CREATININE 0.99 0.97 1.15  CALCIUM 8.7* 8.7* 8.5*    Recent Labs  04/13/16 2220 04/14/16 0544  AST 23 17  ALT 19 16*  ALKPHOS 80 75  BILITOT 0.6 0.7  PROT 7.0 6.8  ALBUMIN 2.8* 2.6*    Recent Labs  04/13/16 2220  07/14/16 0740 09/05/16 0600 11/29/16 0730  WBC 15.7*  < > 6.5 6.9 7.4  NEUTROABS 12.5*  --  3.7  --  4.3  HGB 10.3*  < > 11.2* 10.5* 11.2*  HCT 31.4*  < > 34.8* 31.9* 34.7*  MCV 82.4  < > 86.4 87.4 87.0  PLT 358  < > 233 146* 247  < > = values in this interval not displayed. Lab Results  Component Value Date   TSH 1.323 04/13/2016   Lab Results  Component Value Date   HGBA1C 6.1 (H) 11/12/2014   No results found for: CHOL, HDL, LDLCALC, LDLDIRECT, TRIG, CHOLHDL  Significant Diagnostic Results in last 30 days:  No results found.  Assessment/Plan There are no diagnoses linked to this encounter.   Family/ staff Communication:   Labs/tests ordered:

## 2017-01-02 ENCOUNTER — Encounter (HOSPITAL_COMMUNITY): Payer: Self-pay

## 2017-01-02 ENCOUNTER — Non-Acute Institutional Stay (SKILLED_NURSING_FACILITY): Payer: Medicare Other | Admitting: Internal Medicine

## 2017-01-02 ENCOUNTER — Emergency Department (HOSPITAL_COMMUNITY): Payer: Medicare Other

## 2017-01-02 ENCOUNTER — Inpatient Hospital Stay (HOSPITAL_COMMUNITY)
Admission: EM | Admit: 2017-01-02 | Discharge: 2017-01-04 | DRG: 871 | Disposition: A | Discharge status: Skilled Nursing Facility | Payer: Medicare Other | Attending: Internal Medicine | Admitting: Internal Medicine

## 2017-01-02 DIAGNOSIS — R000 Tachycardia, unspecified: Secondary | ICD-10-CM

## 2017-01-02 DIAGNOSIS — Z7951 Long term (current) use of inhaled steroids: Secondary | ICD-10-CM | POA: Diagnosis not present

## 2017-01-02 DIAGNOSIS — J9811 Atelectasis: Secondary | ICD-10-CM | POA: Diagnosis present

## 2017-01-02 DIAGNOSIS — M479 Spondylosis, unspecified: Secondary | ICD-10-CM | POA: Diagnosis present

## 2017-01-02 DIAGNOSIS — N400 Enlarged prostate without lower urinary tract symptoms: Secondary | ICD-10-CM | POA: Diagnosis present

## 2017-01-02 DIAGNOSIS — Z885 Allergy status to narcotic agent status: Secondary | ICD-10-CM

## 2017-01-02 DIAGNOSIS — R4182 Altered mental status, unspecified: Secondary | ICD-10-CM

## 2017-01-02 DIAGNOSIS — I4891 Unspecified atrial fibrillation: Secondary | ICD-10-CM | POA: Diagnosis present

## 2017-01-02 DIAGNOSIS — N390 Urinary tract infection, site not specified: Secondary | ICD-10-CM

## 2017-01-02 DIAGNOSIS — R404 Transient alteration of awareness: Secondary | ICD-10-CM | POA: Diagnosis not present

## 2017-01-02 DIAGNOSIS — F418 Other specified anxiety disorders: Secondary | ICD-10-CM | POA: Diagnosis present

## 2017-01-02 DIAGNOSIS — K5900 Constipation, unspecified: Secondary | ICD-10-CM | POA: Diagnosis present

## 2017-01-02 DIAGNOSIS — M109 Gout, unspecified: Secondary | ICD-10-CM | POA: Diagnosis present

## 2017-01-02 DIAGNOSIS — F4489 Other dissociative and conversion disorders: Secondary | ICD-10-CM | POA: Diagnosis not present

## 2017-01-02 DIAGNOSIS — D649 Anemia, unspecified: Secondary | ICD-10-CM | POA: Diagnosis present

## 2017-01-02 DIAGNOSIS — A419 Sepsis, unspecified organism: Secondary | ICD-10-CM | POA: Diagnosis not present

## 2017-01-02 DIAGNOSIS — I110 Hypertensive heart disease with heart failure: Secondary | ICD-10-CM | POA: Diagnosis present

## 2017-01-02 DIAGNOSIS — D509 Iron deficiency anemia, unspecified: Secondary | ICD-10-CM

## 2017-01-02 DIAGNOSIS — G2581 Restless legs syndrome: Secondary | ICD-10-CM | POA: Diagnosis not present

## 2017-01-02 DIAGNOSIS — N170 Acute kidney failure with tubular necrosis: Secondary | ICD-10-CM | POA: Diagnosis present

## 2017-01-02 DIAGNOSIS — K224 Dyskinesia of esophagus: Secondary | ICD-10-CM | POA: Diagnosis present

## 2017-01-02 DIAGNOSIS — R509 Fever, unspecified: Secondary | ICD-10-CM

## 2017-01-02 DIAGNOSIS — M1612 Unilateral primary osteoarthritis, left hip: Secondary | ICD-10-CM | POA: Diagnosis not present

## 2017-01-02 DIAGNOSIS — Z7982 Long term (current) use of aspirin: Secondary | ICD-10-CM | POA: Diagnosis not present

## 2017-01-02 DIAGNOSIS — I509 Heart failure, unspecified: Secondary | ICD-10-CM | POA: Diagnosis present

## 2017-01-02 DIAGNOSIS — F0390 Unspecified dementia without behavioral disturbance: Secondary | ICD-10-CM | POA: Diagnosis present

## 2017-01-02 DIAGNOSIS — I959 Hypotension, unspecified: Secondary | ICD-10-CM

## 2017-01-02 DIAGNOSIS — J690 Pneumonitis due to inhalation of food and vomit: Secondary | ICD-10-CM | POA: Diagnosis not present

## 2017-01-02 DIAGNOSIS — Z993 Dependence on wheelchair: Secondary | ICD-10-CM

## 2017-01-02 DIAGNOSIS — N179 Acute kidney failure, unspecified: Secondary | ICD-10-CM

## 2017-01-02 DIAGNOSIS — I480 Paroxysmal atrial fibrillation: Secondary | ICD-10-CM | POA: Diagnosis present

## 2017-01-02 DIAGNOSIS — R531 Weakness: Secondary | ICD-10-CM | POA: Diagnosis not present

## 2017-01-02 DIAGNOSIS — I259 Chronic ischemic heart disease, unspecified: Secondary | ICD-10-CM | POA: Diagnosis present

## 2017-01-02 DIAGNOSIS — K219 Gastro-esophageal reflux disease without esophagitis: Secondary | ICD-10-CM | POA: Diagnosis present

## 2017-01-02 DIAGNOSIS — J189 Pneumonia, unspecified organism: Secondary | ICD-10-CM

## 2017-01-02 DIAGNOSIS — K5904 Chronic idiopathic constipation: Secondary | ICD-10-CM

## 2017-01-02 DIAGNOSIS — E785 Hyperlipidemia, unspecified: Secondary | ICD-10-CM | POA: Diagnosis present

## 2017-01-02 DIAGNOSIS — Z66 Do not resuscitate: Secondary | ICD-10-CM | POA: Diagnosis present

## 2017-01-02 DIAGNOSIS — Z96649 Presence of unspecified artificial hip joint: Secondary | ICD-10-CM | POA: Diagnosis present

## 2017-01-02 DIAGNOSIS — F329 Major depressive disorder, single episode, unspecified: Secondary | ICD-10-CM | POA: Diagnosis not present

## 2017-01-02 DIAGNOSIS — Z85828 Personal history of other malignant neoplasm of skin: Secondary | ICD-10-CM

## 2017-01-02 DIAGNOSIS — Z8673 Personal history of transient ischemic attack (TIA), and cerebral infarction without residual deficits: Secondary | ICD-10-CM

## 2017-01-02 DIAGNOSIS — R6520 Severe sepsis without septic shock: Secondary | ICD-10-CM | POA: Diagnosis present

## 2017-01-02 DIAGNOSIS — I700 Atherosclerosis of aorta: Secondary | ICD-10-CM | POA: Diagnosis present

## 2017-01-02 DIAGNOSIS — Z888 Allergy status to other drugs, medicaments and biological substances status: Secondary | ICD-10-CM

## 2017-01-02 DIAGNOSIS — Z87442 Personal history of urinary calculi: Secondary | ICD-10-CM

## 2017-01-02 HISTORY — DX: Major depressive disorder, single episode, unspecified: F32.9

## 2017-01-02 HISTORY — DX: Depression, unspecified: F32.A

## 2017-01-02 HISTORY — DX: Benign prostatic hyperplasia without lower urinary tract symptoms: N40.0

## 2017-01-02 HISTORY — DX: Repeated falls: R29.6

## 2017-01-02 LAB — COMPREHENSIVE METABOLIC PANEL
ALBUMIN: 3.5 g/dL (ref 3.5–5.0)
ALK PHOS: 87 U/L (ref 38–126)
ALT: 14 U/L — ABNORMAL LOW (ref 17–63)
AST: 26 U/L (ref 15–41)
Anion gap: 9 (ref 5–15)
BILIRUBIN TOTAL: 0.8 mg/dL (ref 0.3–1.2)
BUN: 19 mg/dL (ref 6–20)
CALCIUM: 9 mg/dL (ref 8.9–10.3)
CO2: 25 mmol/L (ref 22–32)
CREATININE: 1.45 mg/dL — AB (ref 0.61–1.24)
Chloride: 101 mmol/L (ref 101–111)
GFR calc Af Amer: 44 mL/min — ABNORMAL LOW (ref >60)
GFR, EST NON AFRICAN AMERICAN: 38 mL/min — AB (ref >60)
GLUCOSE: 127 mg/dL — AB (ref 65–99)
Potassium: 4.8 mmol/L (ref 3.5–5.1)
Sodium: 135 mmol/L (ref 135–145)
TOTAL PROTEIN: 7.4 g/dL (ref 6.5–8.1)

## 2017-01-02 LAB — URINALYSIS, ROUTINE W REFLEX MICROSCOPIC
Bilirubin Urine: NEGATIVE
GLUCOSE, UA: NEGATIVE mg/dL
KETONES UR: NEGATIVE mg/dL
NITRITE: NEGATIVE
PH: 7 (ref 5.0–8.0)
Protein, ur: NEGATIVE mg/dL
Specific Gravity, Urine: 1.01 (ref 1.005–1.030)

## 2017-01-02 LAB — CBC WITH DIFFERENTIAL/PLATELET
BASOS ABS: 0 10*3/uL (ref 0.0–0.1)
BASOS PCT: 0 %
EOS ABS: 0 10*3/uL (ref 0.0–0.7)
EOS PCT: 0 %
HEMATOCRIT: 34.9 % — AB (ref 39.0–52.0)
Hemoglobin: 10.9 g/dL — ABNORMAL LOW (ref 13.0–17.0)
Lymphocytes Relative: 4 %
Lymphs Abs: 1.4 10*3/uL (ref 0.7–4.0)
MCH: 27.5 pg (ref 26.0–34.0)
MCHC: 31.2 g/dL (ref 30.0–36.0)
MCV: 87.9 fL (ref 78.0–100.0)
MONO ABS: 2.3 10*3/uL — AB (ref 0.1–1.0)
Monocytes Relative: 6 %
Neutro Abs: 34.4 10*3/uL — ABNORMAL HIGH (ref 1.7–7.7)
Neutrophils Relative %: 90 %
PLATELETS: 199 10*3/uL (ref 150–400)
RBC: 3.97 MIL/uL — ABNORMAL LOW (ref 4.22–5.81)
RDW: 14.7 % (ref 11.5–15.5)
WBC: 38.2 10*3/uL — ABNORMAL HIGH (ref 4.0–10.5)

## 2017-01-02 LAB — I-STAT CG4 LACTIC ACID, ED
Lactic Acid, Venous: 2.89 mmol/L (ref 0.5–1.9)
Lactic Acid, Venous: 2.22 mmol/L (ref 0.5–1.9)

## 2017-01-02 LAB — PROCALCITONIN: Procalcitonin: 1.35 ng/mL

## 2017-01-02 LAB — LACTIC ACID, PLASMA: LACTIC ACID, VENOUS: 1.4 mmol/L (ref 0.5–1.9)

## 2017-01-02 MED ORDER — HYDROCODONE-ACETAMINOPHEN 5-325 MG PO TABS: 1.0000 | ORAL_TABLET | ORAL | Status: DC

## 2017-01-02 MED ORDER — ASPIRIN EC 81 MG PO TBEC
81.0000 mg | DELAYED_RELEASE_TABLET | Freq: Every day | ORAL | Status: DC
  Administered 2017-01-03 – 2017-01-04 (×2): 81 mg via ORAL
  Filled 2017-01-02 (×2): qty 1

## 2017-01-02 MED ORDER — GUAIFENESIN ER 600 MG PO TB12: 600.0000 mg | ORAL_TABLET | Freq: Two times a day (BID) | ORAL | Status: DC | PRN

## 2017-01-02 MED ORDER — DILTIAZEM HCL-DEXTROSE 100-5 MG/100ML-% IV SOLN (PREMIX): 5.0000 mg/h | INTRAVENOUS | Status: DC

## 2017-01-02 MED ORDER — SODIUM CHLORIDE 0.9 % IV BOLUS (SEPSIS)
1000.0000 mL | Freq: Once | INTRAVENOUS | Status: AC
  Administered 2017-01-02: 1000 mL via INTRAVENOUS

## 2017-01-02 MED ORDER — ORAL CARE MOUTH RINSE
15.0000 mL | Freq: Two times a day (BID) | OROMUCOSAL | Status: DC
  Administered 2017-01-03 – 2017-01-04 (×2): 15 mL via OROMUCOSAL

## 2017-01-02 MED ORDER — ENOXAPARIN SODIUM 30 MG/0.3ML ~~LOC~~ SOLN
30.0000 mg | SUBCUTANEOUS | Status: DC
  Administered 2017-01-03 (×2): 30 mg via SUBCUTANEOUS
  Filled 2017-01-02 (×2): qty 0.3

## 2017-01-02 MED ORDER — PANTOPRAZOLE SODIUM 40 MG PO TBEC
40.0000 mg | DELAYED_RELEASE_TABLET | Freq: Every day | ORAL | Status: DC
  Administered 2017-01-03 – 2017-01-04 (×2): 40 mg via ORAL
  Filled 2017-01-02 (×2): qty 1

## 2017-01-02 MED ORDER — DOCUSATE SODIUM 100 MG PO CAPS
100.0000 mg | ORAL_CAPSULE | Freq: Two times a day (BID) | ORAL | Status: DC
  Administered 2017-01-03 – 2017-01-04 (×4): 100 mg via ORAL
  Filled 2017-01-02 (×4): qty 1

## 2017-01-02 MED ORDER — PIPERACILLIN-TAZOBACTAM 3.375 G IVPB 30 MIN
3.3750 g | Freq: Once | INTRAVENOUS | Status: AC
  Administered 2017-01-02: 3.375 g via INTRAVENOUS
  Filled 2017-01-02: qty 50

## 2017-01-02 MED ORDER — MAGNESIUM HYDROXIDE 400 MG/5ML PO SUSP: 30.0000 mL | Freq: Every day | ORAL | Status: DC | PRN

## 2017-01-02 MED ORDER — ACETAMINOPHEN 325 MG PO TABS
650.0000 mg | ORAL_TABLET | ORAL | Status: DC | PRN
  Administered 2017-01-03: 650 mg via ORAL
  Filled 2017-01-02: qty 2

## 2017-01-02 MED ORDER — VANCOMYCIN HCL IN DEXTROSE 1-5 GM/200ML-% IV SOLN
1000.0000 mg | Freq: Once | INTRAVENOUS | Status: AC
  Administered 2017-01-02: 1000 mg via INTRAVENOUS
  Filled 2017-01-02: qty 200

## 2017-01-02 MED ORDER — MAGNESIUM HYDROXIDE 400 MG/5ML PO SUSP
15.0000 mL | Freq: Once | ORAL | Status: AC
  Administered 2017-01-02: 15 mL via ORAL
  Filled 2017-01-02: qty 30

## 2017-01-02 MED ORDER — SERTRALINE HCL 25 MG PO TABS
37.5000 mg | ORAL_TABLET | Freq: Every day | ORAL | Status: DC
  Administered 2017-01-04: 37.5 mg via ORAL
  Filled 2017-01-02 (×5): qty 1.5

## 2017-01-02 MED ORDER — CHLORHEXIDINE GLUCONATE 0.12 % MT SOLN
15.0000 mL | Freq: Two times a day (BID) | OROMUCOSAL | Status: DC
  Administered 2017-01-03 – 2017-01-04 (×4): 15 mL via OROMUCOSAL
  Filled 2017-01-02 (×4): qty 15

## 2017-01-02 MED ORDER — LACTATED RINGERS IV SOLN
INTRAVENOUS | Status: AC
  Administered 2017-01-03: 01:00:00 via INTRAVENOUS

## 2017-01-02 MED ORDER — ONDANSETRON HCL 4 MG/2ML IJ SOLN: 4.0000 mg | Freq: Four times a day (QID) | INTRAMUSCULAR | Status: DC | PRN

## 2017-01-02 MED ORDER — ROPINIROLE HCL 1 MG PO TABS
2.0000 mg | ORAL_TABLET | Freq: Every day | ORAL | Status: DC
Start: 2017-01-02 — End: 2017-01-04
  Administered 2017-01-03 (×2): 2 mg via ORAL
  Filled 2017-01-02 (×4): qty 2

## 2017-01-02 MED ORDER — IPRATROPIUM-ALBUTEROL 0.5-2.5 (3) MG/3ML IN SOLN: 3.0000 mL | Freq: Four times a day (QID) | RESPIRATORY_TRACT | Status: DC | PRN

## 2017-01-02 NOTE — ED Notes (Signed)
Alleyvn placed to sacral area. Non blanchable erythema to sacrum

## 2017-01-02 NOTE — ED Provider Notes (Addendum)
Emergency Department Provider Note   I have reviewed the triage vital signs and the nursing notes.   HISTORY  Chief Complaint Altered Mental Status   HPI [name] is a 83 y.o. male history of CHF, hypertension, ischemic heart disease, kidney stones and pneumonia the presents to the emergency department from the pain center for confusion, cough and low blood pressures.  Also reportedly had a fever there.  Patient is unable to supply any history.  The niece states that he has had rattling in his chest and cough for the last couple days similar to previous episodes of pneumonia. From review of the records it seems that the patient was not acting himself with decreased talking and decreased mobility when they evaluated him at the facility he was found to have a blood pressure of 70/40, heart rate of 140 and a fever of 101.  They sent him here for evaluation at that time.  LEVEL V CAVEAT SECONDARY TO DEMENTIA   Past Medical History:  Diagnosis Date  . Anxiety   . Benign prostatic hyperplasia   . Bilateral foot pain   . CHF (congestive heart failure) ([place])   . Depression   . Diverticulitis   . DJD (degenerative joint disease), cervical   . DNR (do not resuscitate)   . Esophageal dysmotility    age related per BPE 04/2015  . Esophageal stricture   . Falling   . GERD (gastroesophageal reflux disease)   . Gout   . Hiatal hernia   . History of recurrent TIAs   . HTN (hypertension)   . Hyperlipidemia   . Inguinal hernia   . Ischemic heart disease   . Kidney stone   . Osteoarthritis    bilat knees  . Pleural effusion   . Reflux   . Restless leg syndrome   . Right hip pain   . Skin cancer, basal cell   . Urinary retention     Patient Active Problem List   Diagnosis Date Noted  . Sepsis ([place]) 01/02/2017  . Peripheral vascular disease ([place]) 10/13/2016  . Anemia 08/20/2015  . Osteoarthritis of left hip 08/03/2015  . Ear lesion 07/07/2015  . Pressure ulcer  04/29/2015  . Dysphagia 04/28/2015  . History of gout 04/08/2015  . CHF (congestive heart failure) ([place]) 08/22/2013  . Aspiration pneumonia ([place]) 08/22/2013  . Unspecified constipation 08/22/2013  . GERD (gastroesophageal reflux disease) 08/20/2013  . BPH (benign prostatic hyperplasia) 03/10/2013  . Restless legs 03/10/2013  . Depression 03/10/2013  . Arthritis of knee, degenerative 12/22/2010    Past Surgical History:  Procedure Laterality Date  . ESOPHAGEAL DILATION    . EXPLORATORY LAPAROTOMY W/ BOWEL RESECTION    . FRACTURE SURGERY    . HEMIARTHROPLASTY HIP     Dr. Aline Brochure  . intestines      Current Outpatient Rx  . Order #: [id number] Class: Historical Med  . Order #: [id number] Class: Historical Med  . Order #: [id number] Class: No Print  . Order #: [id number] Class: Historical Med  . Order #: [id number] Class: No Print  . Order #: [id number] Class: Historical Med  . Order #: [id number] Class: Historical Med  . Order #: [id number] Class: Historical Med  . Order #: [id number] Class: Historical Med  . Order #: [id number] Class: Historical Med  . Order #: [id number] Class: Historical Med  . Order #: [id number] Class: Historical Med    Allergies Celebrex [celecoxib] and Codeine  Family History  Problem Relation Age of Onset  . Arthritis Unknown  Social History Social History   Tobacco Use  . Smoking status: Never Smoker  . Smokeless tobacco: Never Used  Substance Use Topics  . Alcohol use: No    Alcohol/week: 0.0 oz  . Drug use: No    Review of Systems  All other systems negative except as documented in the HPI. All pertinent positives and negatives as reviewed in the HPI. ____________________________________________   PHYSICAL EXAM:  VITAL SIGNS: Blood pressure 123/70, pulse (!) 53, resp. rate (!) 25, height 6' (1.829 m), weight 75.8 kg (167 lb), SpO2 97 %.   Constitutional: Alert but disoriented to baseline. Ill appearing and in no acute distress. Cachectic.    Eyes: Conjunctivae are normal. PERRL. EOMI. Sunken.  Head: Atraumatic. Nose: No congestion/rhinnorhea. Mouth/Throat: Mucous membranes are dry.  Oropharynx non-erythematous. Neck: No stridor.  No meningeal signs.   Cardiovascular: Normal rate, regular rhythm. Good peripheral circulation. Grossly normal heart sounds.   Respiratory: Normal respiratory effort.  No retractions. Lungs CTAB. Gastrointestinal: Soft and nontender. No distention.  Musculoskeletal: No lower extremity tenderness nor edema. No gross deformities of extremities. Neurologic:  Normal speech and language. No gross focal neurologic deficits are appreciated.  Skin:  Skin is warm, dry and intact. No rash noted.   ____________________________________________   LABS (all labs ordered are listed, but only abnormal results are displayed)  Labs Reviewed  COMPREHENSIVE METABOLIC PANEL - Abnormal; Notable for the following components:      Result Value   Glucose, Bld 127 (*)    Creatinine, Ser 1.45 (*)    ALT 14 (*)    GFR calc non Af Amer 38 (*)    GFR calc Af Amer 44 (*)    All other components within normal limits  CBC WITH DIFFERENTIAL/PLATELET - Abnormal; Notable for the following components:   WBC 38.2 (*)    RBC 3.97 (*)    Hemoglobin 10.9 (*)    HCT 34.9 (*)    Neutro Abs 34.4 (*)    Monocytes Absolute 2.3 (*)    All other components within normal limits  URINALYSIS, ROUTINE W REFLEX MICROSCOPIC - Abnormal; Notable for the following components:   APPearance HAZY (*)    Hgb urine dipstick SMALL (*)    Leukocytes, UA LARGE (*)    Bacteria, UA RARE (*)    Squamous Epithelial / LPF 0-5 (*)    All other components within normal limits  I-STAT CG4 LACTIC ACID, ED - Abnormal; Notable for the following components:   Lactic Acid, Venous 2.89 (*)    All other components within normal limits  I-STAT CG4 LACTIC ACID, ED - Abnormal; Notable for the following components:   Lactic Acid, Venous 2.22 (*)    All other  components within normal limits  CULTURE, BLOOD (ROUTINE X 2)  CULTURE, BLOOD (ROUTINE X 2)  URINE CULTURE   ____________________________________________  EKG   EKG Interpretation  Date/Time:  Monday January 02 2017 15:40:19 EST Ventricular Rate:  118 PR Interval:    QRS Duration: 119 QT Interval:  372 QTC Calculation: 463 R Axis:   -85 Text Interpretation:  Atrial flutter RBBB and LAFB Artifact in lead(s) I II III aVR aVL aVF V2 V3 bbb and likely afib new since 03/2016 Confirmed by Merrily Pew [id number]) on 01/02/2017 4:55:54 PM       ____________________________________________  RADIOLOGY  Dg Chest Port 1 View  Result Date: 01/02/2017 CLINICAL DATA:  83 year old male with altered mental status. EXAM: PORTABLE CHEST 1 VIEW COMPARISON:  Chest  radiograph dated 04/15/2016 FINDINGS: There is diffuse interstitial coarsening and bronchiectatic changes. Minimal hazy density in the right lung base/ infrahilar region likely atelectasis. Developing infiltrate is less likely. Clinical correlation is recommended. No large pleural effusion. No pneumothorax. Stable top-normal cardiac size. Atherosclerotic calcification of the aortic arch. No acute osseous pathology. IMPRESSION: Atelectasis versus less likely developing infiltrate at the right lung base. Clinical correlation is recommended. Electronically Signed   By: Anner Crete M.D.   On: 01/02/2017 18:58    ____________________________________________   PROCEDURES  Procedure(s) performed:   Procedures  CRITICAL CARE Performed by: Merrily Pew Total critical care time: 35 minutes Critical care time was exclusive of separately billable procedures and treating other patients. Critical care was necessary to treat or prevent imminent or life-threatening deterioration. Critical care was time spent personally by me on the following activities: development of treatment plan with patient and/or surrogate as well as nursing,  discussions with consultants, evaluation of patient's response to treatment, examination of patient, obtaining history from patient or surrogate, ordering and performing treatments and interventions, ordering and review of laboratory studies, ordering and review of radiographic studies, pulse oximetry and re-evaluation of patient's condition.  ____________________________________________   INITIAL IMPRESSION / ASSESSMENT AND PLAN / ED COURSE  Pertinent labs & imaging results that were available during my care of the patient were reviewed by me and considered in my medical decision making (see chart for details).  Patient concern here for sepsis.  Patient started on vancomycin and Zosyn immediately upon arrival.  Cultures were drawn ahead of time.  Urinalysis obtained as well.  I suspect the source is more likely to be respiratory however he does have indwelling catheter so could be urinary as well.  Subsequently patient had elevated lactic acid along with significantly elevated white blood cell count of 38.  Secondary to his age of 69 and these abnormal decompensation from this disease.  He is a DNR however I think he would benefit from IV antibiotics and fluids and close observation in the hospital.  Discussed case with Dr. Lorin Mercy, hospitalist, who agrees and will admit patient here.  ____________________________________________  FINAL CLINICAL IMPRESSION(S) / ED DIAGNOSES  Final diagnoses:  Altered mental status, unspecified altered mental status type  Sepsis, due to unspecified organism [facility])  Urinary tract infection without hematuria, site unspecified  Community acquired pneumonia, unspecified laterality     MEDICATIONS GIVEN DURING THIS VISIT:  Medications  magnesium hydroxide (MILK OF MAGNESIA) suspension 15 mL (not administered)  sodium chloride 0.9 % bolus 1,000 mL (0 mLs Intravenous Stopped 01/02/17 1907)  vancomycin (VANCOCIN) IVPB 1000 mg/200 mL premix (0 mg Intravenous  Stopped 01/02/17 1802)  piperacillin-tazobactam (ZOSYN) IVPB 3.375 g (0 g Intravenous Stopped 01/02/17 1758)     NEW OUTPATIENT MEDICATIONS STARTED DURING THIS VISIT:  This SmartLink is deprecated. Use AVSMEDLIST instead to display the medication list for a patient.  Note:  This document was prepared using Dragon voice recognition software and may include unintentional dictation errors.   Merrily Pew, MD 01/02/17 2100    Merrily Pew, MD 01/02/17 2100

## 2017-01-02 NOTE — ED Notes (Signed)
Red seal noted to be removed at time of arrival to ED. Standard drainage bag tubing replaced. Pt tolerated well.   Sheet placed between pts knees for comfort, pt repositioned. BLE contracted at baseline.

## 2017-01-02 NOTE — Progress Notes (Signed)
This is an acute visit.  Level care skilled.  Facility is [facility]   History of present illness.  Chief complaint-acute visit secondary to fever of unknown origin-tachycardia-hypertension-altered mental status  Patient is a very pleasant and normally alert 83 year old-who has  had a fairly abrupt change in mental status accompanied with tachycardia and hypotension.  Patient has been stable for a fairly extended period of time-he does have a history of aspiration pneumonia- as well as CHF no longer on a diuretic secondary to hypotension and dizziness concerns.    He does have a history of hypertension but is not on any meds secondary to hypotension concerns- he had been on Lopressor at one point with a history of tachycardia-but this has been stable for an extended period of time.    Other medical issues include  Anemia of chronic disease- osteoarthritis-peripheral vascular disease- and chronic indwelling Foley catheter with a history of urinary retention- he also has a history of depression which is under good control on Zoloft-and a history of restless legs he is on Requip.  Normally he is bright and alert able to watch TV and tell me what he is watching     Apparently this morning patient was found to have a low-grade fever of 99.8-he apparently had a vomiting episode as well- according to his nurse this morning he appeared to have some mild mental status changes but returned to his baseline fairly quickly-however he did not eat any lunch this noon.   When I saw him early this afternoon he had decreased responsiveness he was sitting in his wheelchair and did not have any complaints and was able to speak some but certainly a change from his baseline.--He became somewhat more alert the longer I spoke with him but still significant change from his baseline--I had difficulty getting him to open his eyes-  He was also noted on exam to have a low blood pressure of 70/40-as well as  variable heart rate ranging from the low 100s on initial exam to around 140 when I evaluated him a few minutes later  O2 saturation was in the 90s on room air-.  His mental status appeared to wax and wane as I  And nursing staff spoke with him- he was also seen by Dr. Lyndel Safe as well  Past Medical History:  Diagnosis Date  . Anxiety   . Bilateral foot pain   . CHF (congestive heart failure) ([place])   . Diverticulitis   . DJD (degenerative joint disease), cervical   . DNR (do not resuscitate)   . Esophageal dysmotility    age related per BPE 04/2015  . Esophageal stricture   . GERD (gastroesophageal reflux disease)   . Gout   . Hiatal hernia   . History of recurrent TIAs   . HTN (hypertension)   . Hyperlipidemia   . Inguinal hernia   . Ischemic heart disease   . Kidney stone   . Osteoarthritis    bilat knees  . Pleural effusion   . Reflux   . Restless leg syndrome   . Right hip pain   . Skin cancer, basal cell   . Urinary retention         Past Surgical History:  Procedure Laterality Date  . ESOPHAGEAL DILATION    . EXPLORATORY LAPAROTOMY W/ BOWEL RESECTION    . FRACTURE SURGERY    . HEMIARTHROPLASTY HIP     Dr. Aline Brochure  . intestines  Allergies  Allergen Reactions  . Celebrex [Celecoxib] Other (See Comments)    Unknown; patient can't remember  . Codeine Other (See Comments)    nausea      Chest cyst  Outpatient Encounter Prescriptions as of 12/12/2016  Medication Sig  . aspirin EC 81 MG tablet Take 81 mg by mouth daily.  . Cholecalciferol (VITAMIN D) 2000 UNITS CAPS Take 1 capsule by mouth daily.  Marland Kitchen docusate sodium (COLACE) 100 MG capsule Take 1 capsule (100 mg total) by mouth 2 (two) times daily. For constipation.      .  . HYDROcodone-acetaminophen (NORCO/VICODIN) 5-325 MG tablet Take one tablet by mouth twice daily and one tablet by mouth every 6 hours as needed for pain. DNE 3gm of APAP/24hrs  .  magnesium hydroxide (MILK OF MAGNESIA) 400 MG/5ML suspension Take 30 mLs by mouth daily as needed for mild constipation. If no relief try a Fleets enema, after 2 days.  . Menthol, Topical Analgesic, (BIOFREEZE) 4 % GEL Apply small to back as needed for pain  . omeprazole (PRILOSEC) 20 MG capsule Take 20 mg by mouth daily.  Marland Kitchen Propylene Glycol 0.6 % SOLN Apply 1 drop to both eyes twice a day  . rOPINIRole (REQUIP) 2 MG tablet Take 2 mg by mouth at bedtime.  . sertraline (ZOLOFT) 25 MG tablet Take 37.5 mg by mouth daily.   .     .    .    .    .     Review of systems.  This is very limited since patient is talking minimally when asked he says he is not having any pain-he says he is "all right"-he is not complaining of chest pain or shortness of breath.    Physical exam.  Temperature 99.5-pulse variable from 105-140---bllood pressure  70/ 40-respirations of 20-blood pressure on recheck systolic was up to high 72Z.  In general this is a frail elderly male again initially minimally responsive sitting in his wheelchair became somewhat more responsive the longer I talked with him-and actually had more responsiveness when he was put in bed but still certainly not at his baseline  His skin is warm and dry he is not diaphoretic although he does feel somewhat warmer than baseline.  He has numerous solar-induced changes.  Eyes he opened his eyes minimally but appeared to be reactive pupils.  Oropharynx he did not open his mouth wide but he did open to the extent that oropharynx appeared to be clear mucous membranes somewhat dry    Heart as noted above tachycardic could not really appreciate a murmur gallop or rub- he has mild lower extremity edema possibly a bit more than his baseline   Chest he had poor respiratory effort but actually follows verbal commands-- shallow air entry could not really appreciate overt congestion but respiratory effort was fairly poor   Abdomen is protuberant with  a large hernia which is baseline-- bowel sounds are slightly hypoactive-abdomen  does not appear to be tender.  GU he does have an indwelling Foley catheter draining amber colored urine.  Musculoskeletal is able to move all extremities x4 difficult to fully judge strength since he has some decreased responsiveness but does move his extremities  x 4--is able to grip my fingers bilaterally  Neurologic as noted above he has decreased responsiveness but became more alert  When  he was put to bed-could not really appreciate  overt lateralizing findings --but generally weaker than I am used to seeing him  Psych as noted above he is usually bright and alert conversant he does answer appropriately.  But is slow to respond   Labs--  November 29, 2016.  Sodium 136 potassium 4.2 BUN 17 creatinine 1.15  WBC 7.4 hemoglobin 11.2 platelets 247.  Assessment and plan.  1.  Decreased responsiveness with tachycardia and hypotension as well as low-grade fever- patient has had an acute change in status-will send him to the ER for expedient evaluation-with his history that one would be concerned possibly about infection-aspiration pneumonia?-  He is not in distress but certainly has had an acute change-will await ER evaluation   [id number]

## 2017-01-02 NOTE — H&P (Addendum)
History and Physical    Patrick [name] ZOX:[id number] DOB: [birth year] DOA: 01/02/2017  PCP: [facility] Consultants:  None Patient coming from: [facility]; NOK: daughter, niece  Chief Complaint: AMS  HPI: Patrick [name] is a 83 y.o. male with medical history significant of HTN, HLD, GERD, and  reported CHF (no Echo available) presenting with fever, congestion, and "he wasn't too alert this morning".  Currently complaining of need for BM - has not had a BM today, straining, HR up to as high as 170.  Nephew has not heard coughing, just some rasping with breathing.  Patient with some cough, productive of whitish sputum.  Patient denies issues with swallowing.   ED Course: Sepsis - given Vanc/Zosyn.  Likely respiratory source although he does have an indwelling foley.  Elevated lactate, WBC 38.  Review of Systems: As per HPI; otherwise review of systems reviewed and negative.   Ambulatory Status:  Wheelchair bound  Past Medical History:  Diagnosis Date  . Anxiety   . Benign prostatic hyperplasia   . Bilateral foot pain   . CHF (congestive heart failure) ([place])   . Depression   . Diverticulitis   . DJD (degenerative joint disease), cervical   . DNR (do not resuscitate)   . Esophageal dysmotility    age related per BPE 04/2015  . Esophageal stricture   . Falling   . GERD (gastroesophageal reflux disease)   . Gout   . Hiatal hernia   . History of recurrent TIAs   . HTN (hypertension)   . Hyperlipidemia   . Inguinal hernia   . Ischemic heart disease   . Kidney stone   . Osteoarthritis    bilat knees  . Pleural effusion   . Reflux   . Restless leg syndrome   . Right hip pain   . Skin cancer, basal cell   . Urinary retention     Past Surgical History:  Procedure Laterality Date  . ESOPHAGEAL DILATION    . EXPLORATORY LAPAROTOMY W/ BOWEL RESECTION    . FRACTURE SURGERY    . HEMIARTHROPLASTY HIP     Dr. Aline Brochure  . intestines      Social History    Socioeconomic History  . Marital status: Married    Spouse name: Not on file  . Number of children: Not on file  . Years of education: na  . Highest education level: Not on file  Social Needs  . Financial resource strain: Not on file  . Food insecurity - worry: Not on file  . Food insecurity - inability: Not on file  . Transportation needs - medical: Not on file  . Transportation needs - non-medical: Not on file  Occupational History  . Occupation: retired  Tobacco Use  . Smoking status: Never Smoker  . Smokeless tobacco: Never Used  Substance and Sexual Activity  . Alcohol use: No    Alcohol/week: 0.0 oz  . Drug use: No  . Sexual activity: Not on file  Other Topics Concern  . Not on file  Social History Narrative  . Not on file    Allergies  Allergen Reactions  . Celebrex [Celecoxib] Other (See Comments)    Unknown; patient can't remember  . Codeine Other (See Comments)    nausea    Family History  Problem Relation Age of Onset  . Arthritis Unknown     Prior to Admission medications   Medication Sig Start Date End Date Taking? Authorizing Provider  aspirin EC  81 MG tablet Take 81 mg by mouth daily.   Yes [provider]  Cholecalciferol (VITAMIN D) 2000 UNITS CAPS Take 1 capsule by mouth daily.   Yes [provider]  docusate sodium (COLACE) 100 MG capsule Take 1 capsule (100 mg total) by mouth 2 (two) times daily. For constipation. 04/17/16  Yes Elgergawy, Silver Huguenin, MD  guaiFENesin (MUCINEX) 600 MG 12 hr tablet Take 600 mg 2 (two) times daily as needed by mouth for cough or to loosen phlegm.    Yes [provider]  HYDROcodone-acetaminophen (NORCO/VICODIN) 5-325 MG tablet Take one tablet by mouth twice daily and one tablet by mouth every 6 hours as needed for pain. DNE 3gm of APAP/24hrs Patient taking differently: Take 1 tablet See admin instructions by mouth. Take one tablet by mouth twice daily. May take one tablet every 6 hours as  needed for pain 12/07/16  Yes Lassen, Arlo C, PA-C  ipratropium-albuterol (DUONEB) 0.5-2.5 (3) MG/3ML SOLN Take 3 mLs every 6 (six) hours as needed by nebulization (for shortness of breath).   Yes [provider]  magnesium hydroxide (MILK OF MAGNESIA) 400 MG/5ML suspension Take 30 mLs by mouth daily as needed for mild constipation. If no relief try a Fleets enema, after 2 days.   Yes [provider]  Menthol, Topical Analgesic, (BIOFREEZE) 4 % GEL Apply small to back as needed for pain   Yes [provider]  omeprazole (PRILOSEC) 20 MG capsule Take 20 mg by mouth daily.   Yes [provider]  Propylene Glycol 0.6 % SOLN Apply 1 drop to both eyes twice a day   Yes [provider]  rOPINIRole (REQUIP) 2 MG tablet Take 2 mg by mouth at bedtime.   Yes [provider]  sertraline (ZOLOFT) 25 MG tablet Take 37.5 mg by mouth daily.    Yes [provider]    Physical Exam: Vitals:   01/02/17 1930 01/02/17 2100 01/02/17 2130 01/02/17 2200  BP: 123/70 (!) 101/56 (!) 119/59 110/60  Pulse:      Resp: (!) 25 19 17  (!) 26  SpO2:      Weight:      Height:         General:  Appears calm and comfortable and is NAD Eyes:  PERRL, EOMI, normal lids, iris ENT:  Hard of hearing, normal lips & tongue, mmm; dentures on the top, edentulous on the bottom Neck:  no LAD, masses or thyromegaly Cardiovascular:  Bradycardia, no m/r/g. No LE edema.  Respiratory:  CTA bilaterally with no wheezes/rales/rhonchi.  Normal respiratory effort. Abdomen:  soft, NT, ND, NABS Back:   normal alignment, no CVAT Skin:  no rash or induration seen on limited exam Musculoskeletal:  grossly normal tone BUE/BLE, good ROM, no bony abnormality Psychiatric:  grossly normal mood and affect, speech fluent and appropriate, AOx3 Neurologic:  CN 2-12 grossly intact, moves all extremities in coordinated fashion, sensation intact    Radiological Exams on Admission: Dg Chest  Port 1 View  Result Date: 01/02/2017 CLINICAL DATA:  83 year old male with altered mental status. EXAM: PORTABLE CHEST 1 VIEW COMPARISON:  Chest radiograph dated 04/15/2016 FINDINGS: There is diffuse interstitial coarsening and bronchiectatic changes. Minimal hazy density in the right lung base/ infrahilar region likely atelectasis. Developing infiltrate is less likely. Clinical correlation is recommended. No large pleural effusion. No pneumothorax. Stable top-normal cardiac size. Atherosclerotic calcification of the aortic arch. No acute osseous pathology. IMPRESSION: Atelectasis versus less likely developing infiltrate at the  right lung base. Clinical correlation is recommended. Electronically Signed   By: Anner Crete M.D.   On: 01/02/2017 18:58    EKG: Independently reviewed.  Afib/flutter with rate 111; nonspecific ST changes with no evidence of acute ischemia   Labs on Admission: I have personally reviewed the available labs and imaging studies at the time of the admission.  Pertinent labs:   Glucose 127 BUN 19/Creatinine 1.45/GFR 38; 17/1.15/50 on 10/16 Lactate 2.89, 2.22 WBC 38.2 Hgb 10.9 - stable UA: small Hgb, large LE, rare bacteria, TNTC WBC  Assessment/Plan Principal Problem:   Sepsis ([place]) Active Problems:   Aspiration pneumonia (HCC)   Constipation   AKI (acute kidney injury) ([place])   Anemia   Atrial fibrillation with RVR (HCC)   Sepsis, likely related to aspiration PNA -Markedly elevated WBC count, fever at facility, tachycardia with elevated lactate -While awaiting blood cultures, this appears to be a preseptic condition. -Sepsis protocol initiated -Suspect PNA as source, likely aspiration; urinary source is also a consideration -Blood and urine cultures pending -Will admit to SDU with telemetry (see below) and continue to monitor -Treat with IV Vanc and Zosyn due to undifferentiated sepsis and the need to cover for aspiration -Speech therapy swallow  evaluation; NPO until then -Will trend lactate to ensure improvement -Will order lower respiratory tract procalcitonin level.  Antibiotics would not be indicated for PCT <0.1 and probably should not be used for < 0.25.  >0.5 indicates infection and >>0.5 indicates more serious disease.  As the procalcitonin level normalizes, it will be reasonable to consider de-escalation of antibiotic coverage.  Afib with RVR   -Patient presenting with afib - it is not clear if this is new.  -Etiology is thought to be related to sepsis.  -Since the afib onset is unknown, will focus on rate control and searching for the underlying cause at this time. -Will admit to SDU for Diltiazem drip as per protocol with plan to transition to PO Diltiazem once heart rate is controlled.   -Will continue ASA 81 mg PO daily.   -Repeat EKG in AM and consider consulting cardiology at that time. -CHA2DS2-VASc Score is >2 but the patient has high risk for fall, bleeding, and therefore is not a good candidate for anticoagulation.  -Because the family who was available at the time of the admission favored aggressive treatment, SDU admission is warranted despite the patient's age.  Constipation -Patient reports feeling constipation and family member reports straining and marked elevation in HR accordingly -Patient requesting MOM, will order  AKI -Likely related to sepsis -Will gently hydrate at 100 cc for 10 hours and recheck BMP in AM  Anemia -Appears to be stable -Will recheck CBC in AM   DVT prophylaxis:  Lovenox  Code Status: DNR - confirmed with patient/family Family Communication: Nephew present throughout evaluation Disposition Plan:  Home once clinically improved Consults called: None  Admission status: Admit - It is my clinical opinion that admission to INPATIENT is reasonable and necessary because this patient will require at least 2 midnights in the hospital to treat this condition based on the medical complexity  of the problems presented.  Given the aforementioned information, the predictability of an adverse outcome is felt to be significant.   Total critical care time: 65 minutes Critical care time was exclusive of separately billable procedures and treating other patients. Critical care was necessary to treat or prevent imminent or life-threatening deterioration. Critical care was time spent personally by me on the following activities:  development of treatment plan with patient and/or surrogate as well as nursing, discussions with consultants, evaluation of patient's response to treatment, examination of patient, obtaining history from patient or surrogate, ordering and performing treatments and interventions, ordering and review of laboratory studies, ordering and review of radiographic studies, pulse oximetry and re-evaluation of patient's condition.     Karmen Bongo MD [facility]  If note is complete, please contact covering daytime or nighttime physician. [email or web address] Password [email or web address]  01/02/2017, 10:12 PM

## 2017-01-02 NOTE — ED Triage Notes (Signed)
Pt resident of [facility], per facility brought over for evaluation related to decrease LOC and Hypotension. Per EMS, CBG WNL, fever this am with tylenol administration. EMS states auditory respiratory congestion en route.

## 2017-01-03 ENCOUNTER — Other Ambulatory Visit: Payer: Self-pay

## 2017-01-03 DIAGNOSIS — J690 Pneumonitis due to inhalation of food and vomit: Secondary | ICD-10-CM

## 2017-01-03 DIAGNOSIS — A419 Sepsis, unspecified organism: Principal | ICD-10-CM

## 2017-01-03 DIAGNOSIS — N179 Acute kidney failure, unspecified: Secondary | ICD-10-CM

## 2017-01-03 LAB — BASIC METABOLIC PANEL
Anion gap: 5 (ref 5–15)
BUN: 18 mg/dL (ref 6–20)
CALCIUM: 8.5 mg/dL — AB (ref 8.9–10.3)
CHLORIDE: 104 mmol/L (ref 101–111)
CO2: 27 mmol/L (ref 22–32)
CREATININE: 1.36 mg/dL — AB (ref 0.61–1.24)
GFR calc Af Amer: 47 mL/min — ABNORMAL LOW (ref >60)
GFR calc non Af Amer: 41 mL/min — ABNORMAL LOW (ref >60)
Glucose, Bld: 103 mg/dL — ABNORMAL HIGH (ref 65–99)
Potassium: 4.4 mmol/L (ref 3.5–5.1)
Sodium: 136 mmol/L (ref 135–145)

## 2017-01-03 LAB — CBC
HCT: 29.5 % — ABNORMAL LOW (ref 39.0–52.0)
Hemoglobin: 9.5 g/dL — ABNORMAL LOW (ref 13.0–17.0)
MCH: 28.1 pg (ref 26.0–34.0)
MCHC: 32.2 g/dL (ref 30.0–36.0)
MCV: 87.3 fL (ref 78.0–100.0)
Platelets: 160 10*3/uL (ref 150–400)
RBC: 3.38 MIL/uL — ABNORMAL LOW (ref 4.22–5.81)
RDW: 14.9 % (ref 11.5–15.5)
WBC: 19 10*3/uL — ABNORMAL HIGH (ref 4.0–10.5)

## 2017-01-03 LAB — MRSA PCR SCREENING: MRSA by PCR: POSITIVE — AB

## 2017-01-03 LAB — LACTIC ACID, PLASMA: Lactic Acid, Venous: 0.9 mmol/L (ref 0.5–1.9)

## 2017-01-03 MED ORDER — FENTANYL CITRATE (PF) 100 MCG/2ML IJ SOLN
25.0000 ug | Freq: Once | INTRAMUSCULAR | Status: AC
  Administered 2017-01-03: 25 ug via INTRAVENOUS
  Filled 2017-01-03: qty 2

## 2017-01-03 MED ORDER — CHLORHEXIDINE GLUCONATE CLOTH 2 % EX PADS
6.0000 | MEDICATED_PAD | Freq: Every day | CUTANEOUS | Status: DC
  Administered 2017-01-03: 6 via TOPICAL

## 2017-01-03 MED ORDER — HYDROCODONE-ACETAMINOPHEN 5-325 MG PO TABS
1.0000 | ORAL_TABLET | Freq: Four times a day (QID) | ORAL | Status: DC | PRN
  Administered 2017-01-03 – 2017-01-04 (×2): 1 via ORAL
  Filled 2017-01-03 (×2): qty 1

## 2017-01-03 MED ORDER — VANCOMYCIN HCL IN DEXTROSE 1-5 GM/200ML-% IV SOLN
1000.0000 mg | INTRAVENOUS | Status: DC
  Administered 2017-01-04: 1000 mg via INTRAVENOUS
  Filled 2017-01-03 (×2): qty 200

## 2017-01-03 MED ORDER — DICLOFENAC SODIUM 1 % TD GEL
2.0000 g | Freq: Two times a day (BID) | TRANSDERMAL | Status: DC | PRN
  Administered 2017-01-03 – 2017-01-04 (×2): 2 g via TOPICAL
  Filled 2017-01-03: qty 100

## 2017-01-03 MED ORDER — MUPIROCIN 2 % EX OINT
1.0000 "application " | TOPICAL_OINTMENT | Freq: Two times a day (BID) | CUTANEOUS | Status: DC
  Administered 2017-01-03 – 2017-01-04 (×3): 1 via NASAL
  Filled 2017-01-03 (×2): qty 22

## 2017-01-03 MED ORDER — PIPERACILLIN-TAZOBACTAM 3.375 G IVPB
3.3750 g | Freq: Three times a day (TID) | INTRAVENOUS | Status: DC
  Administered 2017-01-03 – 2017-01-04 (×4): 3.375 g via INTRAVENOUS
  Filled 2017-01-03 (×4): qty 50

## 2017-01-03 NOTE — Evaluation (Signed)
Clinical/Bedside Swallow Evaluation Patient Details  Name: Patrick [name] MRN: [id number] Date of Birth: [birth year]  Today's Date: 01/03/2017 Time: SLP Start Time (ACUTE ONLY): 1025 SLP Stop Time (ACUTE ONLY): 1415 SLP Time Calculation (min) (ACUTE ONLY): 26 min  Past Medical History:  Past Medical History:  Diagnosis Date  . Anxiety   . Benign prostatic hyperplasia   . Bilateral foot pain   . CHF (congestive heart failure) ([place])   . Depression   . Diverticulitis   . DJD (degenerative joint disease), cervical   . DNR (do not resuscitate)   . Esophageal dysmotility    age related per BPE 04/2015  . Esophageal stricture   . Falling   . GERD (gastroesophageal reflux disease)   . Gout   . Hiatal hernia   . History of recurrent TIAs   . HTN (hypertension)   . Hyperlipidemia   . Inguinal hernia   . Ischemic heart disease   . Kidney stone   . Osteoarthritis    bilat knees  . Pleural effusion   . Reflux   . Restless leg syndrome   . Right hip pain   . Skin cancer, basal cell   . Urinary retention    Past Surgical History:  Past Surgical History:  Procedure Laterality Date  . ESOPHAGEAL DILATION    . EXPLORATORY LAPAROTOMY W/ BOWEL RESECTION    . FRACTURE SURGERY    . HEMIARTHROPLASTY HIP     Dr. Aline Brochure  . intestines     HPI:  Patrick [name] is a 83 y.o. male with medical history significant of HTN, HLD, GERD, and  reported CHF (no Echo available) presenting with fever, congestion, and "he wasn't too alert this morning".  Currently complaining of need for BM - has not had a BM today, straining, HR up to as high as 170.  Nephew has not heard coughing, just some rasping with breathing.  Patient with some cough, productive of whitish sputum.  Patient denies issues with swallowing. Chest xray shows: Atelectasis versus less likely developing infiltrate at the right   Assessment / Plan / Recommendation Clinical Impression  Clinical swallow evaluation completed at  bedside. Pt sitting up in bed watching [employer] and appears to be up to date on current events. He immediately states that he would like to go back to the [facility] as soon as possible. He reports his baseline diet is minced meats and unrestricted liquids. Pt had MBSS when he was here in March and D3/mech soft and thin liquids was recommended (small sips). No overt signs or symptoms of aspiration during intake today. Pt does present with occasional wet vocal quality (present prior to po) and he benefitted from verbal cue to clear throat  and swallow. Recommend D3/mech soft with thin with previously recommended aspiration precautions (small bites/sips, straw ok, sit upright, clear throat periodically, and repeat/dry swallow). No further SLP services indicated at this time.  SLP Visit Diagnosis: Dysphagia, oropharyngeal phase (R13.12)    Aspiration Risk  Mild aspiration risk    Diet Recommendation Dysphagia 3 (Mech soft);Thin liquid   Liquid Administration via: Cup;Straw Medication Administration: Whole meds with liquid Supervision: Staff to assist with self feeding;Full supervision/cueing for compensatory strategies Compensations: Slow rate;Small sips/bites;Multiple dry swallows after each bite/sip;Other (Comment) Postural Changes: Seated upright at 90 degrees;Remain upright for at least 30 minutes after po intake    Other  Recommendations Oral Care Recommendations: Oral care BID;Staff/trained caregiver to provide oral care Other Recommendations: Clarify dietary restrictions  Follow up Recommendations Skilled Nursing facility      Frequency and Duration            Prognosis Prognosis for Safe Diet Advancement: Fair Barriers to Reach Goals: (h/o dysphagia in advanced age)      [place] Date of Onset: 01/02/17 HPI: Patrick [name] is a 83 y.o. male with medical history significant of HTN, HLD, GERD, and  reported CHF (no Echo available) presenting with fever, congestion, and  "he wasn't too alert this morning".  Currently complaining of need for BM - has not had a BM today, straining, HR up to as high as 170.  Nephew has not heard coughing, just some rasping with breathing.  Patient with some cough, productive of whitish sputum.  Patient denies issues with swallowing. Chest xray shows: Atelectasis versus less likely developing infiltrate at the right Type of Study: Bedside Swallow Evaluation Previous Swallow Assessment: MBSS March 2018 D3/thin Diet Prior to this Study: NPO Temperature Spikes Noted: No Respiratory Status: Nasal cannula History of Recent Intubation: No Behavior/Cognition: Alert;Cooperative;Pleasant mood Oral Cavity Assessment: Within Functional Limits Oral Care Completed by SLP: Yes Oral Cavity - Dentition: Dentures, top Vision: Impaired for self-feeding Self-Feeding Abilities: Able to feed self;Needs assist Patient Positioning: Upright in bed Baseline Vocal Quality: Normal;Wet(mild wet quality) Volitional Cough: Strong Volitional Swallow: Able to elicit    Oral/Motor/Sensory Function Overall Oral Motor/Sensory Function: Within functional limits   Ice Chips Ice chips: Within functional limits Presentation: Spoon   Thin Liquid Thin Liquid: Within functional limits Presentation: Cup;Self Fed;Straw Pharyngeal  Phase Impairments: (occasional wet vocal quality)    Nectar Thick Nectar Thick Liquid: Not tested   Honey Thick Honey Thick Liquid: Not tested   Puree Puree: Within functional limits Presentation: Spoon   Solid   Thank you,  Genene Churn, CCC-SLP [phone]    Solid: Within functional limits Presentation: Spoon Other Comments: mildly prolonged oral phase        Jadon Harbaugh 01/03/2017,2:22 PM

## 2017-01-03 NOTE — Progress Notes (Signed)
Pharmacy Antibiotic Note  Patrick [name] is a 83 y.o. male admitted on 01/02/2017 with sepsis.  Pharmacy has been consulted for Vancomycin and Zosyn dosing.  Plan: Vancomycin 1gm IV every 48 hours.  Goal trough 15-20 mcg/mL. Zosyn 3.375g IV q8h (4 hour infusion).  Monitor labs, micro and vitals.   Height: 5\' 11"  (180.3 cm) Weight: 170 lb 3.1 oz (77.2 kg) IBW/kg (Calculated) : 75.3  Temp (24hrs), Avg:98.6 F (37 C), Min:98 F (36.7 C), Max:99.8 F (37.7 C)  Recent Labs  Lab 01/02/17 1613 01/02/17 1627 01/02/17 1914 01/02/17 2312 01/03/17 0314  WBC 38.2*  --   --   --  19.0*  CREATININE 1.45*  --   --   --  1.36*  LATICACIDVEN  --  2.89* 2.22* 1.4 0.9    Estimated Creatinine Clearance: 30 mL/min (A) (by C-G formula based on SCr of 1.36 mg/dL (H)).    Allergies  Allergen Reactions  . Celebrex [Celecoxib] Other (See Comments)    Unknown; patient can't remember  . Codeine Other (See Comments)    nausea    Antimicrobials this admission: Vanc 11/19 >>  Zosyn 11/19 >>   Dose adjustments this admission: n/a   Microbiology results: 11/19 BCx: pending 11/19 UCx: pending   Sputum:   11/19 MRSA PCR: (+)  Thank you for allowing pharmacy to be a part of this patient's care.  Pricilla Larsson 01/03/2017 8:51 AM

## 2017-01-03 NOTE — Progress Notes (Signed)
PROGRESS NOTE    Patrick [name]  OYD:[id number] DOB: [birth year] DOA: 01/02/2017 PCP: Patient, [name]     Brief Narrative:  This is a 83 year old man admitted from skilled nursing facility on 11/19 due to acute encephalopathy.  Per SNF report he was found to have a fever, congestion and not very alert.  He had a cough productive of whitish sputum.  Was found to have early sepsis, with unclear pneumonia, more likely URI.   Assessment & Plan:   Principal Problem:   Sepsis ([place]) Active Problems:   Aspiration pneumonia (HCC)   Constipation   AKI (acute kidney injury) ([place])   Anemia   Atrial fibrillation with RVR (HCC)   Sepsis -Sepsis parameters have resolved. -Unclear evidence for bacterial pneumonia. -Will however continue antibiotics given his age, potential for decompensation, as well as pro calcitonin of 1.3 on admission. -Given his MRSA PCR was positive and he resides in a nursing home, will give at least another 24 hours of broad-spectrum antibiotics with vancomycin and Zosyn. -Has had significant decrease in WBC count overnight from 38,000-19,000. -Cultures remain negative at 24 hours.  A. fib with RVR -Patient had transient A. fib that resolved before initiation of Cardizem drip. -Unclear if this is new onset. -Given his age and high risk potential, do not believe he is a good candidate for anticoagulation at this time, despite his CHADSVASC score of greater than 2  Acute renal failure -Likely due to sepsis, prerenal azotemia, ATN, improving with IV fluids.   DVT prophylaxis: SQ lovenox Code Status: DNR Family Communication: Patient only Disposition Plan: Back to SNF when ready  Consultants:   None  Procedures:   None  Antimicrobials:  Anti-infectives (From admission, onward)   Start     Dose/Rate Route Frequency Ordered Stop   12/17/2016 1200  vancomycin (VANCOCIN) IVPB 1000 mg/200 mL premix     1,000 mg 200 mL/hr over 60 Minutes Intravenous Every  48 hours 01/03/17 0908     01/03/17 1000  piperacillin-tazobactam (ZOSYN) IVPB 3.375 g     3.375 g 12.5 mL/hr over 240 Minutes Intravenous Every 8 hours 01/03/17 0908     01/02/17 1630  vancomycin (VANCOCIN) IVPB 1000 mg/200 mL premix     1,000 mg 200 mL/hr over 60 Minutes Intravenous  Once 01/02/17 1619 01/02/17 1802   01/02/17 1630  piperacillin-tazobactam (ZOSYN) IVPB 3.375 g     3.375 g 100 mL/hr over 30 Minutes Intravenous  Once 01/02/17 1619 01/02/17 1758       Subjective: In bed, sleeping, HOH but responds appropriately to questions.  Objective: Vitals:   01/03/17 0630 01/03/17 0645 01/03/17 0737 01/03/17 1133  BP: (!) 100/50 (!) 114/56    Pulse: 84   87  Resp: (!) 26 14 (!) 28 19  Temp:   98.5 F (36.9 C) 98 F (36.7 C)  TempSrc:   Axillary Oral  SpO2: 98%   95%  Weight:      Height:        Intake/Output Summary (Last 24 hours) at 01/03/2017 1700 Last data filed at 01/03/2017 1534 Gross per 24 hour  Intake 1678.33 ml  Output 650 ml  Net 1028.33 ml   Filed Weights   01/02/17 1545 01/02/17 2256 01/03/17 0400  Weight: 75.8 kg (167 lb) 77.2 kg (170 lb 3.1 oz) 77.2 kg (170 lb 3.1 oz)    Examination:  General exam: Alert, awake, oriented x 3 Respiratory system: Coarse bilateral breath sounds Cardiovascular system:RRR. No murmurs,  rubs, gallops. Gastrointestinal system: Abdomen is nondistended, soft and nontender. No organomegaly or masses felt. Normal bowel sounds heard. Central nervous system: Moves all 4 spontaneously Extremities: No C/C/E, +pedal pulses Psychiatry: Judgement and insight appear normal. Mood & affect appropriate.     Data Reviewed: I have personally reviewed following labs and imaging studies  CBC: Recent Labs  Lab 01/02/17 1613 01/03/17 0314  WBC 38.2* 19.0*  NEUTROABS 34.4*  --   HGB 10.9* 9.5*  HCT 34.9* 29.5*  MCV 87.9 87.3  PLT 199 245   Basic Metabolic Panel: Recent Labs  Lab 01/02/17 1613 01/03/17 0314  NA 135 136    K 4.8 4.4  CL 101 104  CO2 25 27  GLUCOSE 127* 103*  BUN 19 18  CREATININE 1.45* 1.36*  CALCIUM 9.0 8.5*   GFR: Estimated Creatinine Clearance: 30 mL/min (A) (by C-G formula based on SCr of 1.36 mg/dL (H)). Liver Function Tests: Recent Labs  Lab 01/02/17 1613  AST 26  ALT 14*  ALKPHOS 87  BILITOT 0.8  PROT 7.4  ALBUMIN 3.5   No results for input(s): LIPASE, AMYLASE in the last 168 hours. No results for input(s): AMMONIA in the last 168 hours. Coagulation Profile: No results for input(s): INR, PROTIME in the last 168 hours. Cardiac Enzymes: No results for input(s): CKTOTAL, CKMB, CKMBINDEX, TROPONINI in the last 168 hours. BNP (last 3 results) No results for input(s): PROBNP in the last 8760 hours. HbA1C: No results for input(s): HGBA1C in the last 72 hours. CBG: No results for input(s): GLUCAP in the last 168 hours. Lipid Profile: No results for input(s): CHOL, HDL, LDLCALC, TRIG, CHOLHDL, LDLDIRECT in the last 72 hours. Thyroid Function Tests: No results for input(s): TSH, T4TOTAL, FREET4, T3FREE, THYROIDAB in the last 72 hours. Anemia Panel: No results for input(s): VITAMINB12, FOLATE, FERRITIN, TIBC, IRON, RETICCTPCT in the last 72 hours. Urine analysis:    Component Value Date/Time   COLORURINE YELLOW 01/02/2017 1543   APPEARANCEUR HAZY (A) 01/02/2017 1543   LABSPEC 1.010 01/02/2017 1543   PHURINE 7.0 01/02/2017 1543   GLUCOSEU NEGATIVE 01/02/2017 1543   HGBUR SMALL (A) 01/02/2017 1543   BILIRUBINUR NEGATIVE 01/02/2017 1543   KETONESUR NEGATIVE 01/02/2017 1543   PROTEINUR NEGATIVE 01/02/2017 1543   UROBILINOGEN 0.2 12/29/2014 0245   NITRITE NEGATIVE 01/02/2017 1543   LEUKOCYTESUR LARGE (A) 01/02/2017 1543   Sepsis Labs: @LABRCNTIP (procalcitonin:4,lacticidven:4)  ) Recent Results (from the past 240 hour(s))  Blood Culture (routine x 2)     Status: None (Preliminary result)   Collection Time: 01/02/17  4:13 PM  Result Value Ref Range Status    Specimen Description BLOOD RIGHT HAND  Final   Special Requests   Final    BOTTLES DRAWN AEROBIC AND ANAEROBIC Blood Culture adequate volume   Culture NO GROWTH < 24 HOURS  Final   Report Status PENDING  Incomplete  Blood Culture (routine x 2)     Status: None (Preliminary result)   Collection Time: 01/02/17  4:13 PM  Result Value Ref Range Status   Specimen Description RIGHT ANTECUBITAL  Final   Special Requests   Final    BOTTLES DRAWN AEROBIC AND ANAEROBIC Blood Culture adequate volume   Culture NO GROWTH < 24 HOURS  Final   Report Status PENDING  Incomplete  MRSA PCR Screening     Status: Abnormal   Collection Time: 01/02/17 10:30 PM  Result Value Ref Range Status   MRSA by PCR POSITIVE (A) NEGATIVE Final  Comment:        The GeneXpert MRSA Assay (FDA approved for NASAL specimens only), is one component of a comprehensive MRSA colonization surveillance program. It is not intended to diagnose MRSA infection nor to guide or monitor treatment for MRSA infections. RESULT CALLED TO, READ BACK BY AND VERIFIED WITH: HEARN,J @0455  BY MATTHEWS, B 11.20.18          Radiology Studies: Dg Chest Port 1 View  Result Date: 01/02/2017 CLINICAL DATA:  83 year old male with altered mental status. EXAM: PORTABLE CHEST 1 VIEW COMPARISON:  Chest radiograph dated 04/15/2016 FINDINGS: There is diffuse interstitial coarsening and bronchiectatic changes. Minimal hazy density in the right lung base/ infrahilar region likely atelectasis. Developing infiltrate is less likely. Clinical correlation is recommended. No large pleural effusion. No pneumothorax. Stable top-normal cardiac size. Atherosclerotic calcification of the aortic arch. No acute osseous pathology. IMPRESSION: Atelectasis versus less likely developing infiltrate at the right lung base. Clinical correlation is recommended. Electronically Signed   By: Anner Crete M.D.   On: 01/02/2017 18:58        Scheduled Meds: .  aspirin EC  81 mg Oral Daily  . chlorhexidine  15 mL Mouth Rinse BID  . Chlorhexidine Gluconate Cloth  6 each Topical Q0600  . docusate sodium  100 mg Oral BID  . enoxaparin (LOVENOX) injection  30 mg Subcutaneous Q24H  . mouth rinse  15 mL Mouth Rinse q12n4p  . mupirocin ointment  1 application Nasal BID  . pantoprazole  40 mg Oral Daily  . rOPINIRole  2 mg Oral QHS  . sertraline  37.5 mg Oral Daily   Continuous Infusions: . piperacillin-tazobactam (ZOSYN)  IV 3.375 g (01/03/17 1450)  . [START ON 01/03/2017] vancomycin       LOS: 1 day    Time spent: 35 minutes. Greater than 50% of this time was spent in direct contact with the patient coordinating care.     Lelon Frohlich, MD [facility] Pager [phone]  If 7PM-7AM, please contact night-coverage [email or web address] Password [email or web address] 01/03/2017, 5:00 PM

## 2017-01-04 ENCOUNTER — Non-Acute Institutional Stay (SKILLED_NURSING_FACILITY): Payer: Medicare Other | Admitting: Internal Medicine

## 2017-01-04 ENCOUNTER — Encounter: Payer: Self-pay | Admitting: Internal Medicine

## 2017-01-04 ENCOUNTER — Inpatient Hospital Stay
Admission: RE | Admit: 2017-01-04 | Discharge: 2018-05-16 | Disposition: E | Payer: Medicare Other | Source: Ambulatory Visit | Attending: Internal Medicine | Admitting: Internal Medicine

## 2017-01-04 DIAGNOSIS — F329 Major depressive disorder, single episode, unspecified: Secondary | ICD-10-CM

## 2017-01-04 DIAGNOSIS — I4891 Unspecified atrial fibrillation: Secondary | ICD-10-CM | POA: Diagnosis not present

## 2017-01-04 DIAGNOSIS — N179 Acute kidney failure, unspecified: Secondary | ICD-10-CM | POA: Diagnosis not present

## 2017-01-04 DIAGNOSIS — R05 Cough: Principal | ICD-10-CM

## 2017-01-04 DIAGNOSIS — R059 Cough, unspecified: Principal | ICD-10-CM

## 2017-01-04 DIAGNOSIS — G2581 Restless legs syndrome: Secondary | ICD-10-CM | POA: Diagnosis not present

## 2017-01-04 DIAGNOSIS — M1612 Unilateral primary osteoarthritis, left hip: Secondary | ICD-10-CM

## 2017-01-04 DIAGNOSIS — R0989 Other specified symptoms and signs involving the circulatory and respiratory systems: Secondary | ICD-10-CM

## 2017-01-04 DIAGNOSIS — A419 Sepsis, unspecified organism: Secondary | ICD-10-CM

## 2017-01-04 DIAGNOSIS — J690 Pneumonitis due to inhalation of food and vomit: Secondary | ICD-10-CM | POA: Diagnosis not present

## 2017-01-04 DIAGNOSIS — F32A Depression, unspecified: Secondary | ICD-10-CM

## 2017-01-04 LAB — BASIC METABOLIC PANEL
ANION GAP: 7 (ref 5–15)
BUN: 16 mg/dL (ref 6–20)
CHLORIDE: 102 mmol/L (ref 101–111)
CO2: 23 mmol/L (ref 22–32)
CREATININE: 1.12 mg/dL (ref 0.61–1.24)
Calcium: 8.4 mg/dL — ABNORMAL LOW (ref 8.9–10.3)
GFR calc non Af Amer: 51 mL/min — ABNORMAL LOW (ref >60)
GFR, EST AFRICAN AMERICAN: 60 mL/min — AB (ref >60)
Glucose, Bld: 92 mg/dL (ref 65–99)
POTASSIUM: 3.9 mmol/L (ref 3.5–5.1)
SODIUM: 132 mmol/L — AB (ref 135–145)

## 2017-01-04 LAB — CBC
HCT: 32.2 % — ABNORMAL LOW (ref 39.0–52.0)
HEMOGLOBIN: 10 g/dL — AB (ref 13.0–17.0)
MCH: 27.1 pg (ref 26.0–34.0)
MCHC: 31.1 g/dL (ref 30.0–36.0)
MCV: 87.3 fL (ref 78.0–100.0)
PLATELETS: 162 10*3/uL (ref 150–400)
RBC: 3.69 MIL/uL — AB (ref 4.22–5.81)
RDW: 14.9 % (ref 11.5–15.5)
WBC: 10 10*3/uL (ref 4.0–10.5)

## 2017-01-04 LAB — URINE CULTURE

## 2017-01-04 MED ORDER — ENOXAPARIN SODIUM 40 MG/0.4ML ~~LOC~~ SOLN: 40.0000 mg | SUBCUTANEOUS | Status: DC

## 2017-01-04 MED ORDER — AMOXICILLIN-POT CLAVULANATE 500-125 MG PO TABS: 1.0000 | ORAL_TABLET | Freq: Two times a day (BID) | ORAL | 0 refills | Status: AC

## 2017-01-04 NOTE — Progress Notes (Signed)
Given bed bath, protective dressing applied to sacrum.  Has been alert and oriented today.  Niece visited.  To be discharged back to snf today on oral abx

## 2017-01-04 NOTE — Care Management Important Message (Signed)
Important Message  Patient Details  Name: Patrick [name] MRN: [id number] Date of Birth: 11-May-1913   Medicare Important Message Given:  Yes    Sherald Barge, RN 01/06/2017, 12:49 PM

## 2017-01-04 NOTE — Discharge Summary (Addendum)
Physician Discharge Summary  Patrick [name] DVV:[id number] DOB: 03-09-1913 DOA: 01/02/2017  PCP: Patient, [name]  Admit date: 01/02/2017 Discharge date: 01/06/2017  Time spent: 45 minutes  Recommendations for Outpatient Follow-up:  -Will be discharged back to SNF today. -Will need to complete a 6 day course of augmentin.   Discharge Diagnoses:  Principal Problem:   Sepsis ([place]) Active Problems:   Aspiration pneumonia ([place])   Constipation   AKI (acute kidney injury) ([place])   Anemia   Atrial fibrillation with RVR ([place])   Discharge Condition: Stable and improved  Filed Weights   01/02/17 1545 01/02/17 2256 01/03/17 0400  Weight: 75.8 kg (167 lb) 77.2 kg (170 lb 3.1 oz) 77.2 kg (170 lb 3.1 oz)    History of present illness:  As per Dr. Lorin Mercy on 11/19: Patrick [name] is a 83 y.o. male with medical history significant of HTN, HLD, GERD, and  reported CHF (no Echo available) presenting with fever, congestion, and "he wasn't too alert this morning".  Currently complaining of need for BM - has not had a BM today, straining, HR up to as high as 170.  Nephew has not heard coughing, just some rasping with breathing.  Patient with some cough, productive of whitish sputum.  Patient denies issues with swallowing.   ED Course: Sepsis - given Vanc/Zosyn.  Likely respiratory source although he does have an indwelling foley.  Elevated lactate, WBC 38.    Hospital Course:   Sepsis -Sepsis parameters have resolved. -Has had significant decrease in WBC count  from 38,000 on admission to 10,000 on DC. -Cultures remain negative at 2 days. -Plan to DC back to SNF today on augmentin for 6 days. -No current oxygen requirements.  Paroxysmal A. fib with RVR -Patient had transient A. fib that resolved before initiation of Cardizem drip. -Unclear if this is new onset. -Given his age and high risk potential, do not believe he is a good candidate for anticoagulation at this time,  despite his CHADSVASC score of greater than 2  Acute renal failure -Likely due to sepsis, prerenal azotemia, ATN, resolved with IV fluids. -Cr is 1.12 on DC.    Procedures:  None   Consultations:  None  Discharge Instructions  Discharge Instructions    Diet - low sodium heart healthy   Complete by:  As directed    Increase activity slowly   Complete by:  As directed      Allergies as of 12/15/2016      Reactions   Celebrex [celecoxib] Other (See Comments)   Unknown; patient can't remember   Codeine Other (See Comments)   nausea      Medication List    TAKE these medications   amoxicillin-clavulanate 500-125 MG tablet Commonly known as:  AUGMENTIN Take 1 tablet (500 mg total) by mouth 2 (two) times daily for 6 days.   aspirin EC 81 MG tablet Take 81 mg by mouth daily.   BIOFREEZE 4 % Gel Generic drug:  Menthol (Topical Analgesic) Apply small to back as needed for pain   docusate sodium 100 MG capsule Commonly known as:  COLACE Take 1 capsule (100 mg total) by mouth 2 (two) times daily. For constipation.   guaiFENesin 600 MG 12 hr tablet Commonly known as:  MUCINEX Take 600 mg 2 (two) times daily as needed by mouth for cough or to loosen phlegm.   HYDROcodone-acetaminophen 5-325 MG tablet Commonly known as:  NORCO/VICODIN Take one tablet by mouth twice  daily and one tablet by mouth every 6 hours as needed for pain. DNE 3gm of APAP/24hrs What changed:    how much to take  how to take this  when to take this  additional instructions   ipratropium-albuterol 0.5-2.5 (3) MG/3ML Soln Commonly known as:  DUONEB Take 3 mLs every 6 (six) hours as needed by nebulization (for shortness of breath).   magnesium hydroxide 400 MG/5ML suspension Commonly known as:  MILK OF MAGNESIA Take 30 mLs by mouth daily as needed for mild constipation. If no relief try a Fleets enema, after 2 days.   omeprazole 20 MG capsule Commonly known as:  PRILOSEC Take 20 mg by  mouth daily.   Propylene Glycol 0.6 % Soln Apply 1 drop to both eyes twice a day   rOPINIRole 2 MG tablet Commonly known as:  REQUIP Take 2 mg by mouth at bedtime.   sertraline 25 MG tablet Commonly known as:  ZOLOFT Take 37.5 mg by mouth daily.   Vitamin D 2000 units Caps Take 1 capsule by mouth daily.      Allergies  Allergen Reactions  . Celebrex [Celecoxib] Other (See Comments)    Unknown; patient can't remember  . Codeine Other (See Comments)    nausea      The results of significant diagnostics from this hospitalization (including imaging, microbiology, ancillary and laboratory) are listed below for reference.    Significant Diagnostic Studies: Dg Chest Port 1 View  Result Date: 01/02/2017 CLINICAL DATA:  83 year old male with altered mental status. EXAM: PORTABLE CHEST 1 VIEW COMPARISON:  Chest radiograph dated 04/15/2016 FINDINGS: There is diffuse interstitial coarsening and bronchiectatic changes. Minimal hazy density in the right lung base/ infrahilar region likely atelectasis. Developing infiltrate is less likely. Clinical correlation is recommended. No large pleural effusion. No pneumothorax. Stable top-normal cardiac size. Atherosclerotic calcification of the aortic arch. No acute osseous pathology. IMPRESSION: Atelectasis versus less likely developing infiltrate at the right lung base. Clinical correlation is recommended. Electronically Signed   By: Anner Crete M.D.   On: 01/02/2017 18:58    Microbiology: Recent Results (from the past 240 hour(s))  Urine Culture     Status: Abnormal   Collection Time: 01/02/17  3:45 PM  Result Value Ref Range Status   Specimen Description URINE, RANDOM  Final   Special Requests NONE  Final   Culture MULTIPLE SPECIES PRESENT, SUGGEST RECOLLECTION (A)  Final   Report Status 01/03/2017 FINAL  Final  Blood Culture (routine x 2)     Status: None (Preliminary result)   Collection Time: 01/02/17  4:13 PM  Result Value Ref  Range Status   Specimen Description BLOOD RIGHT HAND  Final   Special Requests   Final    BOTTLES DRAWN AEROBIC AND ANAEROBIC Blood Culture adequate volume   Culture NO GROWTH 2 DAYS  Final   Report Status PENDING  Incomplete  Blood Culture (routine x 2)     Status: None (Preliminary result)   Collection Time: 01/02/17  4:13 PM  Result Value Ref Range Status   Specimen Description RIGHT ANTECUBITAL  Final   Special Requests   Final    BOTTLES DRAWN AEROBIC AND ANAEROBIC Blood Culture adequate volume   Culture NO GROWTH 2 DAYS  Final   Report Status PENDING  Incomplete  MRSA PCR Screening     Status: Abnormal   Collection Time: 01/02/17 10:30 PM  Result Value Ref Range Status   MRSA by PCR POSITIVE (A) NEGATIVE Final  Comment:        The GeneXpert MRSA Assay (FDA approved for NASAL specimens only), is one component of a comprehensive MRSA colonization surveillance program. It is not intended to diagnose MRSA infection nor to guide or monitor treatment for MRSA infections. RESULT CALLED TO, READ BACK BY AND VERIFIED WITH: HEARN,J @0455  BY MATTHEWS, B 11.20.18      Labs: Basic Metabolic Panel: Recent Labs  Lab 01/02/17 1613 01/03/17 0314 12/15/2016 0446  NA 135 136 132*  K 4.8 4.4 3.9  CL 101 104 102  CO2 25 27 23   GLUCOSE 127* 103* 92  BUN 19 18 16   CREATININE 1.45* 1.36* 1.12  CALCIUM 9.0 8.5* 8.4*   Liver Function Tests: Recent Labs  Lab 01/02/17 1613  AST 26  ALT 14*  ALKPHOS 87  BILITOT 0.8  PROT 7.4  ALBUMIN 3.5   No results for input(s): LIPASE, AMYLASE in the last 168 hours. No results for input(s): AMMONIA in the last 168 hours. CBC: Recent Labs  Lab 01/02/17 1613 01/03/17 0314 12/29/2016 0446  WBC 38.2* 19.0* 10.0  NEUTROABS 34.4*  --   --   HGB 10.9* 9.5* 10.0*  HCT 34.9* 29.5* 32.2*  MCV 87.9 87.3 87.3  PLT 199 160 162   Cardiac Enzymes: No results for input(s): CKTOTAL, CKMB, CKMBINDEX, TROPONINI in the last 168 hours. BNP: BNP  (last 3 results) No results for input(s): BNP in the last 8760 hours.  ProBNP (last 3 results) No results for input(s): PROBNP in the last 8760 hours.  CBG: No results for input(s): GLUCAP in the last 168 hours.     Signed:  Lelon Frohlich  [facility] Pager: [phone] 12/26/2016, 12:00 PM

## 2017-01-04 NOTE — NC FL2 (Signed)
[place] LEVEL OF CARE SCREENING TOOL     IDENTIFICATION  Patient Name: Patrick [name] Birthdate: 1913-06-16 Sex: male Admission Date (Current Location): 01/02/2017  [facility] and [place] Number:  [employer] and Address:  [place] [address], [place]      Provider Number: [id number]  Attending Physician Name and Address:  Isaac Bliss, [place]  Relative Name and Phone Number:       Current Level of Care: Hospital Recommended Level of Care: [place] Prior Approval Number:    Date Approved/Denied:   PASRR Number:    Discharge Plan: SNF    Current Diagnoses: Patient Active Problem List   Diagnosis Date Noted  . Sepsis ([place]) 01/02/2017  . Atrial fibrillation with RVR ([place]) 01/02/2017  . Peripheral vascular disease ([place]) 10/13/2016  . Anemia 08/20/2015  . Osteoarthritis of left hip 08/03/2015  . Ear lesion 07/07/2015  . Pressure ulcer 04/29/2015  . Dysphagia 04/28/2015  . History of gout 04/08/2015  . AKI (acute kidney injury) ([place]) 12/06/2014  . CHF (congestive heart failure) ([place]) 08/22/2013  . Aspiration pneumonia ([place]) 08/22/2013  . Constipation 08/22/2013  . GERD (gastroesophageal reflux disease) 08/20/2013  . BPH (benign prostatic hyperplasia) 03/10/2013  . Restless legs 03/10/2013  . Depression 03/10/2013  . Arthritis of knee, degenerative 12/22/2010    Orientation RESPIRATION BLADDER Height & Weight     Self, Time, Situation, Place  Normal Indwelling catheter Weight: 170 lb 3.1 oz (77.2 kg) Height:  5\' 11"  (180.3 cm)  BEHAVIORAL SYMPTOMS/MOOD NEUROLOGICAL BOWEL NUTRITION STATUS      Continent    AMBULATORY STATUS COMMUNICATION OF NEEDS Skin   Extensive Assist   Normal                       Personal Care Assistance Level of Assistance    Bathing Assistance: Maximum assistance Feeding assistance: Limited assistance Dressing Assistance: Maximum assistance      Functional Limitations Info    Sight Info: Impaired Hearing Info: Impaired Speech Info: Adequate    SPECIAL CARE FACTORS FREQUENCY                       Contractures      Additional Factors Info  Code Status, Allergies Code Status Info: DNR Allergies Info: Clebrex, Codeine     Isolation Precautions Info: MRSA     Current Medications (12/17/2016):  This is the current hospital active medication list Current Facility-Administered Medications  Medication Dose Route Frequency Provider Last Rate Last Dose  . acetaminophen (TYLENOL) tablet 650 mg  650 mg Oral Q4H PRN Karmen Bongo, MD   650 mg at 01/03/17 1717  . aspirin EC tablet 81 mg  81 mg Oral Daily Karmen Bongo, MD   81 mg at 01/10/2017 0938  . chlorhexidine (PERIDEX) 0.12 % solution 15 mL  15 mL Mouth Rinse BID Karmen Bongo, MD   15 mL at 12/15/2016 0840  . Chlorhexidine Gluconate Cloth 2 % PADS 6 each  6 each Topical Q0600 Isaac Bliss, Rayford Halsted, MD   6 each at 01/03/17 1000  . diclofenac sodium (VOLTAREN) 1 % transdermal gel 2 g  2 g Topical BID PRN Isaac Bliss, Rayford Halsted, MD   2 g at 12/28/2016 0840  . docusate sodium (COLACE) capsule 100 mg  100 mg Oral BID Karmen Bongo, MD   100 mg at 12/26/2016 0839  . enoxaparin (LOVENOX) injection  40 mg  40 mg Subcutaneous Q24H Isaac Bliss, Rayford Halsted, MD      . guaiFENesin [facility]) 12 hr tablet 600 mg  600 mg Oral BID PRN Karmen Bongo, MD      . HYDROcodone-acetaminophen (NORCO/VICODIN) 5-325 MG per tablet 1 tablet  1 tablet Oral Q6H PRN Reubin Milan, MD   1 tablet at 12/19/2016 1333  . ipratropium-albuterol (DUONEB) 0.5-2.5 (3) MG/3ML nebulizer solution 3 mL  3 mL Nebulization Q6H PRN Karmen Bongo, MD      . magnesium hydroxide (MILK OF MAGNESIA) suspension 30 mL  30 mL Oral Daily PRN Karmen Bongo, MD      . MEDLINE mouth rinse  15 mL Mouth Rinse q12n4p Karmen Bongo, MD   15 mL at 12/26/2016 1150  . mupirocin ointment (BACTROBAN) 2 % 1  application  1 application Nasal BID Isaac Bliss, Rayford Halsted, MD   1 application at 32/91/91 [id number]  . ondansetron (ZOFRAN) injection 4 mg  4 mg Intravenous Q6H PRN Karmen Bongo, MD      . pantoprazole (PROTONIX) EC tablet 40 mg  40 mg Oral Daily Karmen Bongo, MD   40 mg at 12/28/2016 0839  . piperacillin-tazobactam (ZOSYN) IVPB 3.375 g  3.375 g Intravenous Q8H Isaac Bliss, Rayford Halsted, MD 12.5 mL/hr at 12/25/2016 0839 3.375 g at 01/07/2017 0839  . rOPINIRole (REQUIP) tablet 2 mg  2 mg Oral QHS Karmen Bongo, MD   2 mg at 01/03/17 2116  . sertraline (ZOLOFT) tablet 37.5 mg  37.5 mg Oral Daily Karmen Bongo, MD   37.5 mg at 12/26/2016 1150  . vancomycin (VANCOCIN) IVPB 1000 mg/200 mL premix  1,000 mg Intravenous Q48H Isaac Bliss, Rayford Halsted, MD 200 mL/hr at 01/03/2017 1217 1,000 mg at 12/20/2016 1217     Discharge Medications: Please see discharge summary for a list of discharge medications.  Relevant Imaging Results:  Relevant Lab Results:   Additional Information SSN:   Shade Flood, LCSW

## 2017-01-04 NOTE — Plan of Care (Signed)
New sacral dressing applied

## 2017-01-04 NOTE — Clinical Social Work Note (Signed)
Clinical Social Work Assessment  Patient Details  Name: Patrick [name] MRN: [id number] Date of Birth: [birth year]  Date of referral:  12/31/2016               Reason for consult:  Discharge Planning                Permission sought to share information with:  Facility Art therapist granted to share information::  Yes, Release of Information Signed  Name::        Agency::  [place]  Relationship::  facility  Contact Information:     Housing/Transportation Living arrangements for the past 2 months:  [place] of Information:  Patient, Facility Patient Interpreter Needed:  None Criminal Activity/Legal Involvement Pertinent to Current Situation/Hospitalization:    Significant Relationships:  Adult Children Lives with:  Facility Resident Do you feel safe going back to the place where you live?  Yes Need for family participation in patient care:  Yes (Comment)  Care giving concerns: Pt requires extensive care in the NH.   Social Worker assessment / plan: Pt is a 83 year old male admitted from [facility] with UTI. Pt stable for dc back to [facility] today per RN CM. Pt is agreeable to dc back to [facility]. Pt will be on oral antibiotics for dc. Shanna Cisco at [facility]. Prepared dc information and sent through Epic. Pt's family notified. RN updated and she will call report. No other SW needs for dc.  Employment status:  Retired Forensic scientist:  Medicare PT Recommendations:  Not assessed at this time Information / Referral to community resources:     Patient/Family's Response to care: Pt and family agreeable to care.  Patient/Family's Understanding of and Emotional Response to Diagnosis, Current Treatment, and Prognosis: Pt and family understand and are comfortable with care.  Emotional Assessment Appearance:  Appears stated age Attitude/Demeanor/Rapport:    Affect (typically observed):  Accepting Orientation:  Oriented to Self, Oriented to Place, Oriented to   Time, Oriented to Situation Alcohol / Substance use:    Psych involvement (Current and /or in the community):     Discharge Needs  Concerns to be addressed:  Discharge Planning Concerns Readmission within the last 30 days:  No Current discharge risk:  None Barriers to Discharge:  No Barriers Identified   Shade Flood, LCSW 01/08/2017, 2:22 PM

## 2017-01-04 NOTE — Progress Notes (Signed)
Report called to [facility].  Iv removed and discharge medication papers sent in envelope

## 2017-01-04 NOTE — Progress Notes (Signed)
Location:   [facility] Room Number: 139/D Place of Service:  SNF (31) Provider:  Cordella Nyquist,Khyri Hinzman  Patient, [name]  Patient Care Team: Patient, [name], Marga Melnick, MD as Consulting Physician (Gastroenterology) [name], MD as Consulting Physician [facility])  Extended Emergency Contact Information Primary Emergency Contact: Black,Shirley Address: [address]          [place], [place] [address] [name] of [place] Phone: [phone] Mobile Phone: [phone] Relation: Daughter Secondary Emergency Contact: [facility] Phone: [phone] Relation: Niece  Code Status:  DNR Goals of care: Advanced Directive information Advanced Directives 01/11/2017  Does Patient Have a Medical Advance Directive? Yes  Type of Advance Directive Out of facility DNR (pink MOST or yellow form)  Does patient want to make changes to medical advance directive? No - Patient declined  Copy of [place] in Chart? No - copy requested  Pre-existing out of facility DNR order (yellow form or pink MOST form) -     Chief Complaint  Patient presents with  . Hospitalization Follow-up    Hospitalization Follow Up Visit  For sepsis-aspiration  HPI:  Pt is a 83 y.o. [name] seen today for a hospital follow-up for sepsis.  Thought secondary to aspiration pneumonia.  Patient has a medical history of hypertension-hyperlipidemia GERD and suspected CHF- as well as osteoarthritis-peripheral vascular disease-and an indwelling Foley catheter with a history of urinary retention.    Earlier this week he had an episode of vomiting-and had is some altered mental status which apparently improved however when I saw him early that afternoon he was regressing and had altered mental status --was somewhat difficult to arouse-he was also found to be tachycardic and hypotensive.  He was treated aggressively in the hospital with  vancomycin and Zosyn-his white count actually was 38,000 initially however this did trend down significantly and actually was within normal range this morning  he is on 6 additional days of Augmentin and also has as needed Mucinex and nebulizers  He was also thought to be in transitory atrial fibrillation with rapid ventricular rate-he did require Cardizem drip-thought not to be a good anticoagulation candidate because of his age and high risk potential.  Also he had decreased of 1.45 on admission the hospital however it was down to-1.12 on discharge today which is near his baseline.  Currently he is lying in bed comfortably his vital signs are stable heart rate is controlled he appears to be back on his baseline watching television smiling in good humor.            Past Medical History:  Diagnosis Date  . Anxiety   . Benign prostatic hyperplasia   . Bilateral foot pain   . CHF (congestive heart failure) ([place])   . Depression   . Diverticulitis   . DJD (degenerative joint disease), cervical   . DNR (do not resuscitate)   . Esophageal dysmotility    age related per BPE 04/2015  . Esophageal stricture   . Falling   . GERD (gastroesophageal reflux disease)   . Gout   . Hiatal hernia   . History of recurrent TIAs   . HTN (hypertension)   . Hyperlipidemia   . Inguinal hernia   . Ischemic heart disease   . Kidney stone   . Osteoarthritis    bilat knees  . Pleural effusion   . Reflux   . Restless leg syndrome   .  Right hip pain   . Skin cancer, basal cell   . Urinary retention    Past Surgical History:  Procedure Laterality Date  . ESOPHAGEAL DILATION    . EXPLORATORY LAPAROTOMY W/ BOWEL RESECTION    . FRACTURE SURGERY    . HEMIARTHROPLASTY HIP     Dr. Aline Brochure  . intestines      Allergies  Allergen Reactions  . Celebrex [Celecoxib] Other (See Comments)    Unknown; patient can't remember  . Codeine Other (See Comments)    nausea    Allergies as of  12/29/2016      Reactions   Celebrex [celecoxib] Other (See Comments)   Unknown; patient can't remember   Codeine Other (See Comments)   nausea      Medication List        Accurate as of 12/22/2016  6:43 PM. Always use your most recent med list.          amoxicillin-clavulanate 500-125 MG tablet Commonly known as:  AUGMENTIN Take 1 tablet (500 mg total) by mouth 2 (two) times daily for 6 days.   aspirin EC 81 MG tablet Take 81 mg by mouth daily.   BIOFREEZE 4 % Gel Generic drug:  Menthol (Topical Analgesic) Apply small to back as needed for pain   docusate sodium 100 MG capsule Commonly known as:  COLACE Take 1 capsule (100 mg total) by mouth 2 (two) times daily. For constipation.   guaiFENesin 600 MG 12 hr tablet Commonly known as:  MUCINEX Take 600 mg 2 (two) times daily as needed by mouth for cough or to loosen phlegm.   HYDROcodone-acetaminophen 5-325 MG tablet Commonly known as:  NORCO/VICODIN Take one tablet by mouth twice daily and one tablet by mouth every 6 hours as needed for pain. DNE 3gm of APAP/24hrs   ipratropium-albuterol 0.5-2.5 (3) MG/3ML Soln Commonly known as:  DUONEB Take 3 mLs every 6 (six) hours as needed by nebulization (for shortness of breath).   magnesium hydroxide 400 MG/5ML suspension Commonly known as:  MILK OF MAGNESIA Take 30 mLs by mouth daily as needed for mild constipation. If no relief try a Fleets enema, after 2 days.   omeprazole 20 MG capsule Commonly known as:  PRILOSEC Take 20 mg by mouth daily.   Propylene Glycol 0.6 % Soln Apply 1 drop to both eyes twice a day   rOPINIRole 2 MG tablet Commonly known as:  REQUIP Take 2 mg by mouth at bedtime.   sertraline 25 MG tablet Commonly known as:  ZOLOFT Take 37.5 mg by mouth daily.   Vitamin D 2000 units Caps Take 1 capsule by mouth daily.       Review of Systems   In general is not complaining any fever or chills says he feels back to himself.  Skin does not complain  of rashes or itching he does have an area covered on his buttocks which is followed by wound care.  Head ears eyes nose mouth and throat is not complaining of any sore throat or visual changes.  Respiratory is not complaining at this time with shortness of breath--does not appear to have much of a cough is being treated for aspiration pneumonia.  Cardiac does not complain of chest pain or edema.  GI is not complaining of abdominal discomfort nausea vomiting diarrhea constipation.  GU has an indwelling Foley catheter does not complain of burning with urination .  Musculoskeletal has lower extremity weakness but is not really complaining of joint  pain currently.  Neurologic does not dizziness headache or syncope.  Psych continues to be in good spirits does have a history of depression but this is well current medication appears to be in good spirits today is not complaining of any anxiety or depression he is glad to be back to normal This   Physical EXAM  Temperature is 97.0 pulse is 92 respirations 20 blood pressure 122/73 by machine--I got 110/52 manually-O2 saturation is 97% on room air  This is a pleasant elderly [name] in no distress lying comfortably in bed.  His skin is warm and dry he has numerous solar-induced changes with a history of skin carcinomas which has been followed by dermatology. Also has an area on his buttocks that is covered and followed by wound care apparently there is no sign of infection and is more of a protective dressing   Eyes pupils appear reactive to light sclera and conjunctive are clear visual acuity appears intact.  Oropharynx is clear membranes appear fairly moist.  Chest is clear to auscultation with somewhat shallow air entry.  Heart is regular rate and rhythm in the 90s without murmur gallop or rub he has minimal lower extremity edema.  Abdomen is soft nontender he has a large abdominal hernia right sided which appears to be baseline bowel  sounds are positive.  Musculoskeletal general frailty but is able to move all extremities at baseline strength appears to be intact.  He has general frailty.  Neurologic is grossly intact his speech is clear he is alert-cranial nerves appear to be intact.  Psych is largely alert and oriented pleasant and appropriate back at his baseline  Immunization History  Administered Date(s) Administered  . Influenza,inj,Quad PF,6+ Mos 12/07/2014  . Influenza-Unspecified 11/20/2013, 11/13/2015, 11/18/2016  . PPD Test 03/16/2013  . Pneumococcal-Unspecified 02/14/2009, 11/25/2015  . Tdap 11/10/2016   Pertinent  Health Maintenance Due  Topic Date Due  . PNA vac Low Risk Adult (2 of 2 - PCV13) 01/14/2017 (Originally 11/24/2016)  . INFLUENZA VACCINE  Completed   Fall Risk  11/10/2016  Falls in the past year? No   Functional Status Survey:    Vitals:   01/03/2017 1842  BP: 122/73  Pulse: 92  Resp: 20  Temp: (!) 97 F (36.1 C)  TempSrc: Oral  SpO2: 97%   Physical exam as noted above   Labs reviewed: Recent Labs    01/02/17 1613 01/03/17 0314 01/13/2017 0446  NA 135 136 132*  K 4.8 4.4 3.9  CL 101 104 102  CO2 25 27 23   GLUCOSE 127* 103* 92  BUN 19 18 16   CREATININE 1.45* 1.36* 1.12  CALCIUM 9.0 8.5* 8.4*   Recent Labs    04/13/16 2220 04/14/16 0544 01/02/17 1613  AST 23 17 26   ALT 19 16* 14*  ALKPHOS 80 75 87  BILITOT 0.6 0.7 0.8  PROT 7.0 6.8 7.4  ALBUMIN 2.8* 2.6* 3.5   Recent Labs    07/14/16 0740  11/29/16 0730 01/02/17 1613 01/03/17 0314 01/12/2017 0446  WBC 6.5   < > 7.4 38.2* 19.0* 10.0  NEUTROABS 3.7  --  4.3 34.4*  --   --   HGB 11.2*   < > 11.2* 10.9* 9.5* 10.0*  HCT 34.8*   < > 34.7* 34.9* 29.5* 32.2*  MCV 86.4   < > 87.0 87.9 87.3 87.3  PLT 233   < > 247 199 160 162   < > = values in this interval not displayed.  Lab Results  Component Value Date   TSH 1.323 04/13/2016   Lab Results  Component Value Date   HGBA1C 6.1 (H) 11/12/2014   No  results found for: CHOL, HDL, LDLCALC, LDLDIRECT, TRIG, CHOLHDL  Significant Diagnostic Results in last 30 days:  Dg Chest Port 1 View  Result Date: 01/02/2017 CLINICAL DATA:  83 year old [name] with altered mental status. EXAM: PORTABLE CHEST 1 VIEW COMPARISON:  Chest radiograph dated 04/15/2016 FINDINGS: There is diffuse interstitial coarsening and bronchiectatic changes. Minimal hazy density in the right lung base/ infrahilar region likely atelectasis. Developing infiltrate is less likely. Clinical correlation is recommended. No large pleural effusion. No pneumothorax. Stable top-normal cardiac size. Atherosclerotic calcification of the aortic arch. No acute osseous pathology. IMPRESSION: Atelectasis versus less likely developing infiltrate at the right lung base. Clinical correlation is recommended. Electronically Signed   By: Anner Crete M.D.   On: 01/02/2017 18:58    Assessment/Plan  #1-sepsis thought secondary to aspiration pneumonia- treated initially with IV antibiotics he is now been discharged on Augmentin he appears to be significantly improved white count has actually normalized-he appears to be back at his baseline continue Augmentin for 6 additional days is also on as needed nebulizers as well as Mucinex will update a CBC with differential he is back at his baseline on Friday to ensure stability.  2.  Renal insufficiency thought secondary to sepsis with this is largely normalized as well with a creatinine of 1.12 today we will update this later this week as well.  3.  Altered mental status again this appears to have essentially resolved actually fairly quickly-she is back at his baseline.  4.  History of transitory A. fib he appears to be back in sinus rhythm he is not tachycardic at this point will monitor not on a rate limiting agent or aggressive anticoagulation he is on low-dose ASA   #5- 3: History of osteoarthritis he continues on Vicodin twice a day and every 6 hours as  needed this point will monitor.  6.  History of restless legs he continues on Requip 80.  7.-  depression-this appears under very good control on Zoloft at one point he appeared to have some transitory subdued mood but this has largely resolved on the Zoloft.  8.  History of constipation he is on Colace twice a day as well as milk of magnesia as needed we will monitor.  9.  History of urinary retention continue with chronic indwelling Foley catheter he has tolerated this well.   Again will update his CBC with differential and BMP later this week clinically appears to be at baseline and stable.  [id number] note greater than 35 minutes spent assessing patient-reviewing his chart-reviewing his labs-and coordinating and formulating a plan of care for numerous diagnoses-of note greater than 50% of time spent coordinating plan of care  .

## 2017-01-06 ENCOUNTER — Encounter (HOSPITAL_COMMUNITY)
Admission: RE | Admit: 2017-01-06 | Discharge: 2017-01-06 | Disposition: A | Discharge status: Home / Self Care | Payer: Medicare Other | Source: Skilled Nursing Facility | Attending: Internal Medicine | Admitting: Internal Medicine

## 2017-01-06 ENCOUNTER — Other Ambulatory Visit: Payer: Self-pay

## 2017-01-06 DIAGNOSIS — I259 Chronic ischemic heart disease, unspecified: Secondary | ICD-10-CM | POA: Diagnosis not present

## 2017-01-06 LAB — CBC WITH DIFFERENTIAL/PLATELET
Basophils Absolute: 0 10*3/uL (ref 0.0–0.1)
Basophils Relative: 0 %
Eosinophils Absolute: 0.3 10*3/uL (ref 0.0–0.7)
Eosinophils Relative: 5 %
HEMATOCRIT: 32.5 % — AB (ref 39.0–52.0)
HEMOGLOBIN: 10 g/dL — AB (ref 13.0–17.0)
LYMPHS ABS: 1.9 10*3/uL (ref 0.7–4.0)
Lymphocytes Relative: 29 %
MCH: 27 pg (ref 26.0–34.0)
MCHC: 30.8 g/dL (ref 30.0–36.0)
MCV: 87.8 fL (ref 78.0–100.0)
MONO ABS: 0.9 10*3/uL (ref 0.1–1.0)
MONOS PCT: 14 %
NEUTROS ABS: 3.4 10*3/uL (ref 1.7–7.7)
NEUTROS PCT: 52 %
Platelets: 185 10*3/uL (ref 150–400)
RBC: 3.7 MIL/uL — ABNORMAL LOW (ref 4.22–5.81)
RDW: 14.9 % (ref 11.5–15.5)
WBC: 6.6 10*3/uL (ref 4.0–10.5)

## 2017-01-06 LAB — BASIC METABOLIC PANEL
Anion gap: 9 (ref 5–15)
BUN: 16 mg/dL (ref 6–20)
CHLORIDE: 100 mmol/L — AB (ref 101–111)
CO2: 23 mmol/L (ref 22–32)
CREATININE: 1.36 mg/dL — AB (ref 0.61–1.24)
Calcium: 8.3 mg/dL — ABNORMAL LOW (ref 8.9–10.3)
GFR calc Af Amer: 47 mL/min — ABNORMAL LOW (ref >60)
GFR calc non Af Amer: 41 mL/min — ABNORMAL LOW (ref >60)
GLUCOSE: 112 mg/dL — AB (ref 65–99)
Potassium: 3.7 mmol/L (ref 3.5–5.1)
Sodium: 132 mmol/L — ABNORMAL LOW (ref 135–145)

## 2017-01-06 MED ORDER — HYDROCODONE-ACETAMINOPHEN 5-325 MG PO TABS: ORAL_TABLET | ORAL | 0 refills | Status: DC

## 2017-01-06 NOTE — Telephone Encounter (Signed)
RX Fax for [facility]@ [phone]  

## 2017-01-07 LAB — CULTURE, BLOOD (ROUTINE X 2)
Culture: NO GROWTH
Culture: NO GROWTH
SPECIAL REQUESTS: ADEQUATE
SPECIAL REQUESTS: ADEQUATE

## 2017-01-09 ENCOUNTER — Non-Acute Institutional Stay (SKILLED_NURSING_FACILITY): Payer: Medicare Other | Admitting: Internal Medicine

## 2017-01-09 ENCOUNTER — Encounter (HOSPITAL_COMMUNITY)
Admission: RE | Admit: 2017-01-09 | Discharge: 2017-01-09 | Disposition: A | Discharge status: Home / Self Care | Payer: Medicare Other | Source: Skilled Nursing Facility | Attending: *Deleted | Admitting: *Deleted

## 2017-01-09 ENCOUNTER — Encounter: Payer: Self-pay | Admitting: Internal Medicine

## 2017-01-09 DIAGNOSIS — I739 Peripheral vascular disease, unspecified: Secondary | ICD-10-CM | POA: Diagnosis not present

## 2017-01-09 DIAGNOSIS — D509 Iron deficiency anemia, unspecified: Secondary | ICD-10-CM | POA: Diagnosis not present

## 2017-01-09 DIAGNOSIS — I4891 Unspecified atrial fibrillation: Secondary | ICD-10-CM | POA: Diagnosis not present

## 2017-01-09 DIAGNOSIS — J690 Pneumonitis due to inhalation of food and vomit: Secondary | ICD-10-CM | POA: Diagnosis not present

## 2017-01-09 DIAGNOSIS — I259 Chronic ischemic heart disease, unspecified: Secondary | ICD-10-CM | POA: Diagnosis not present

## 2017-01-09 DIAGNOSIS — N401 Enlarged prostate with lower urinary tract symptoms: Secondary | ICD-10-CM

## 2017-01-09 LAB — BASIC METABOLIC PANEL
Anion gap: 7 (ref 5–15)
BUN: 13 mg/dL (ref 6–20)
CO2: 27 mmol/L (ref 22–32)
CREATININE: 1.01 mg/dL (ref 0.61–1.24)
Calcium: 8.5 mg/dL — ABNORMAL LOW (ref 8.9–10.3)
Chloride: 99 mmol/L — ABNORMAL LOW (ref 101–111)
GFR calc Af Amer: >60 mL/min (ref >60)
GFR, EST NON AFRICAN AMERICAN: 58 mL/min — AB (ref >60)
Glucose, Bld: 94 mg/dL (ref 65–99)
POTASSIUM: 4.5 mmol/L (ref 3.5–5.1)
SODIUM: 133 mmol/L — AB (ref 135–145)

## 2017-01-09 NOTE — Progress Notes (Addendum)
Provider: Veleta Miners  Location:   [facility] Room Number: 139/D Place of Service:  SNF (31)  PCP: Patient, [name] Patient Care Team: Patient, [name], Marga Melnick, MD as Consulting Physician (Gastroenterology) [name], MD as Consulting Physician [facility])  Extended Emergency Contact Information Primary Emergency Contact: Black,Shirley Address: [address]          [place], [place] [address] [name] of [place] Phone: [phone] Mobile Phone: [phone] Relation: Daughter Secondary Emergency Contact: [facility] Phone: [phone] Relation: Niece  Code Status: DNR Goals of Care: Advanced Directive information Advanced Directives 01/09/2017  Does Patient Have a Medical Advance Directive? Yes  Type of Advance Directive Out of facility DNR (pink MOST or yellow form)  Does patient want to make changes to medical advance directive? No - Patient declined  Copy of [place] in Chart? No - copy requested  Pre-existing out of facility DNR order (yellow form or pink MOST form) -      Chief Complaint  Patient presents with  . Readmit To SNF    Readmission Visit    HPI: Patient is a 83 y.o. male seen today for admission to SNF for Long term Care.after staying in the Hospital from 11/19-11/21  Patient Is Long term resident of facility and has h/o HTN, CHF, Depression, Arthritis, Anemia, GERD, Chronic Foley Cathter due to Urinary retention.PVD ABI in 2015 refused Aggressive approach. Aspiration Pneumonia and Dysphagia  He was found in his room with altered Mental status, Lethargy and low grade fever. He was send to the ED with Ambulance. In the ED he was found to be in sepsis with White count of 38K. He was treated with Vancomycin and Zosyn.Marland Kitchen His Chest Xray showed possible right lower lobe infiltrate.He also was in Atrial fibrillation with RVR which resolved without any  treatment. Patient had remarkable recovery and he was discharged back to facility on Augmentin. He was seen by Speech therapy also in the hospital and no changes were made to his diet. Patient is doing better in facility.  He  said that he had mild cough but  no SOB or fever or Chest pain..    Past Medical History:  Diagnosis Date  . Anxiety   . Benign prostatic hyperplasia   . Bilateral foot pain   . CHF (congestive heart failure) ([place])   . Depression   . Diverticulitis   . DJD (degenerative joint disease), cervical   . DNR (do not resuscitate)   . Esophageal dysmotility    age related per BPE 04/2015  . Esophageal stricture   . Falling   . GERD (gastroesophageal reflux disease)   . Gout   . Hiatal hernia   . History of recurrent TIAs   . HTN (hypertension)   . Hyperlipidemia   . Inguinal hernia   . Ischemic heart disease   . Kidney stone   . Osteoarthritis    bilat knees  . Pleural effusion   . Reflux   . Restless leg syndrome   . Right hip pain   . Skin cancer, basal cell   . Urinary retention    Past Surgical History:  Procedure Laterality Date  . ESOPHAGEAL DILATION    . EXPLORATORY LAPAROTOMY W/ BOWEL RESECTION    . FRACTURE SURGERY    . HEMIARTHROPLASTY HIP     Dr. Aline Brochure  . intestines      reports that  has never smoked. he has never used smokeless tobacco. He reports that he does not drink alcohol or use drugs. Social History   Socioeconomic History  . Marital status: Married    Spouse name: Not on file  . Number of children: Not on file  . Years of education: na  . Highest education level: Not on file  Social Needs  . Financial resource strain: Not on file  . Food insecurity - worry: Not on file  . Food insecurity - inability: Not on file  . Transportation needs - medical: Not on file  . Transportation needs - non-medical: Not on file  Occupational History  . Occupation: retired  Tobacco Use  . Smoking status: Never Smoker  . Smokeless  tobacco: Never Used  Substance and Sexual Activity  . Alcohol use: No    Alcohol/week: 0.0 oz  . Drug use: No  . Sexual activity: Not on file  Other Topics Concern  . Not on file  Social History Narrative  . Not on file    Functional Status Survey:    Family History  Problem Relation Age of Onset  . Arthritis Unknown     Health Maintenance  Topic Date Due  . PNA vac Low Risk Adult (2 of 2 - PCV13) 01/14/2017 (Originally 11/24/2016)  . TETANUS/TDAP  11/11/2026  . INFLUENZA VACCINE  Completed    Allergies  Allergen Reactions  . Celebrex [Celecoxib] Other (See Comments)    Unknown; patient can't remember  . Codeine Other (See Comments)    nausea    Allergies as of 01/09/2017      Reactions   Celebrex [celecoxib] Other (See Comments)   Unknown; patient can't remember   Codeine Other (See Comments)   nausea      Medication List    Notice   This visit is during an admission. Changes to the med list made in this visit will be reflected in the After Visit Summary of the admission.     Review of Systems  Review of Systems  Constitutional: Negative for activity change, appetite change, chills, diaphoresis, fatigue and fever.  HENT: Negative for mouth sores, postnasal drip, rhinorrhea, sinus pain and sore throat.   Respiratory: Negative for apnea, cough, chest tightness, shortness of breath and wheezing.   Cardiovascular: Negative for chest pain, palpitations and leg swelling.  Gastrointestinal: Negative for abdominal distention, abdominal pain, constipation, diarrhea, nausea and vomiting.  Genitourinary: Negative for dysuria and frequency.  Musculoskeletal: Negative for arthralgias, joint swelling and myalgias.  Skin: Negative for rash.  Neurological: Negative for dizziness, syncope, weakness, light-headedness and numbness.  Psychiatric/Behavioral: Negative for behavioral problems, confusion and sleep disturbance.     Vitals:   01/09/17 1857  BP: 110/60    Pulse: 98  Resp: 20  Temp: 97.6 F (36.4 C)   There is no height or weight on file to calculate BMI. Physical Exam  Constitutional: He is oriented to person, place, and time. He appears well-developed and well-nourished.  HENT:  Head: Normocephalic.  Mouth/Throat: Oropharynx is clear and moist.  Eyes: Pupils are equal, round, and reactive to light.  Neck: Neck supple.  Cardiovascular: Normal rate, regular rhythm and normal heart sounds.  Pulmonary/Chest: Effort normal and breath sounds normal. No respiratory distress. He has no wheezes. He has no rales.  Abdominal: Soft. Bowel sounds are normal. He exhibits no distension. There is no tenderness. There is no rebound.  Has abdominal Hernia  Musculoskeletal:  Trace edema Bilateral  Lymphadenopathy:  He has no cervical adenopathy.  Neurological: He is alert and oriented to person, place, and time.  No Focal deficits  Skin: Skin is warm and dry.  Psychiatric: He has a normal mood and affect. His behavior is normal. Thought content normal.    Labs reviewed: Basic Metabolic Panel: Recent Labs    12/26/2016 0446 01/06/17 0730 01/09/17 0400  NA 132* 132* 133*  K 3.9 3.7 4.5  CL 102 100* 99*  CO2 23 23 27   GLUCOSE 92 112* 94  BUN 16 16 13   CREATININE 1.12 1.36* 1.01  CALCIUM 8.4* 8.3* 8.5*   Liver Function Tests: Recent Labs    04/13/16 2220 04/14/16 0544 01/02/17 1613  AST 23 17 26   ALT 19 16* 14*  ALKPHOS 80 75 87  BILITOT 0.6 0.7 0.8  PROT 7.0 6.8 7.4  ALBUMIN 2.8* 2.6* 3.5   No results for input(s): LIPASE, AMYLASE in the last 8760 hours. No results for input(s): AMMONIA in the last 8760 hours. CBC: Recent Labs    11/29/16 0730 01/02/17 1613 01/03/17 0314 12/21/2016 0446 01/06/17 0730  WBC 7.4 38.2* 19.0* 10.0 6.6  NEUTROABS 4.3 34.4*  --   --  3.4  HGB 11.2* 10.9* 9.5* 10.0* 10.0*  HCT 34.7* 34.9* 29.5* 32.2* 32.5*  MCV 87.0 87.9 87.3 87.3 87.8  PLT 247 199 160 162 185   Cardiac Enzymes: No results  for input(s): CKTOTAL, CKMB, CKMBINDEX, TROPONINI in the last 8760 hours. BNP: Invalid input(s): POCBNP Lab Results  Component Value Date   HGBA1C 6.1 (H) 11/12/2014   Lab Results  Component Value Date   TSH 1.323 04/13/2016   Lab Results  Component Value Date   VITAMINB12 320 08/24/2015   No results found for: FOLATE Lab Results  Component Value Date   FERRITIN 17 (L) 08/24/2015    Imaging and Procedures obtained prior to SNF admission: No results found.  Assessment/Plan  Aspiration pneumonia of right lower lobe On Augmentin finishing the course. Doing well.  Atrial fibrillation with RVR spontaneously resolved Seems like new diagnosis but due to his age and frailty anticoagulation is not recommended . Patient is back in normal sinus rhythm. Continue on aspirin.  It  Peripheral vascular disease  No further workup as per patient request   Iron deficiency anemia,  Hemoglobin Is stable Dysphagia On modified diet.  No changes recommended by speech therapy in the hospital osteoarthritis involving multiple joints Pain controlled on hydrocodone GERD Stable on Prilosec  BPH with Chronic Foley No Problems. Tolerating it well.  Restless legs On Requip.  Family/ staff Communication:   Labs/tests ordered: Follow up Labs ordered.  Total time spent in this patient care encounter was 45_ minutes; greater than 50% of the visit spent counseling patient, reviewing records , Labs and coordinating care for problems addressed at this encounter.

## 2017-01-14 DEATH — deceased

## 2017-02-04 ENCOUNTER — Non-Acute Institutional Stay (SKILLED_NURSING_FACILITY): Payer: Medicare Other | Admitting: Internal Medicine

## 2017-02-04 DIAGNOSIS — I739 Peripheral vascular disease, unspecified: Secondary | ICD-10-CM | POA: Diagnosis not present

## 2017-02-04 DIAGNOSIS — D509 Iron deficiency anemia, unspecified: Secondary | ICD-10-CM | POA: Diagnosis not present

## 2017-02-04 DIAGNOSIS — N179 Acute kidney failure, unspecified: Secondary | ICD-10-CM

## 2017-02-04 DIAGNOSIS — M1712 Unilateral primary osteoarthritis, left knee: Secondary | ICD-10-CM

## 2017-02-04 DIAGNOSIS — J690 Pneumonitis due to inhalation of food and vomit: Secondary | ICD-10-CM | POA: Diagnosis not present

## 2017-02-04 DIAGNOSIS — I509 Heart failure, unspecified: Secondary | ICD-10-CM | POA: Diagnosis not present

## 2017-02-04 DIAGNOSIS — G2581 Restless legs syndrome: Secondary | ICD-10-CM

## 2017-02-06 NOTE — Progress Notes (Addendum)
   Of note this note was not an error  --- there was a technical issue that was corrected but the note was not done in error

## 2017-02-06 NOTE — Progress Notes (Signed)
This is a routine visit.  Level of care skilled.  Facility is [facility].  Chief complaint-routine visit for medical management of chronic medical conditions including hypertension-CHF- osteoarthritis-depression-anemia-GERD- chronic Foley catheter secondary to urinary retention-peripheral vascular disease-as well as history of aspiration pneumonia with dysphagia.  He continues to be quite stable approximately a month ago he did have a significant change in mental status was minimally responsive and increasingly weak-with an elevated white count of over 30,000.  He was found to have suspected aspiration pneumonia treated with vancomycin and Zosyn.  He also was in atrial fibrillation with rapid ventricular response but this resolved without any aggressive treatment.  He made a significant and fairly remarkable recovery and was shortly discharged back to the facility on Augmentin which she is completed.  Sinchas no acute complaints tonight-nursing staff reports he does have some pain issues later in the day feel he would benefit from routine hydrocodone-he does have a routine scheduled for in the morning as well as at bedtime.  He does have a history osteoarthritis.  His other medical issues appear to be stable he does have a history of hypertension but is not really on any antihypertensives currently secondary to concerns of lower blood pressures recent blood pressure is 110/54 109/58.  He also has a history of peripheral vascular disease with lower extremity wounds at times but these have resolved fairly unremarkably he has refused aggressive approaches but nonetheless appears to have stabilized in this regard.  He continues with a chronic indwelling Foley catheter with a history of urinary retention which has been stable for some time at one point he had been on Flomax but this was discontinued secondary to concerns of hypotension--.    He also has a history of CHF is no longer on  Lasix nonetheless edema and weight appears to be stable and there have been no recent issues.  Regards to anemia hemoglobin most recently was 10.0 back in late November and this appears stable as well.  He does have some history of transitory A. fib in the past but again this is been stable and he did have an episode during his hospitalization but there is been no reoccurrence.  Today he is resting in his wheelchair comfortably which is his baseline weight has been stable at around 169 pounds-again nursing has noted some pain complaints later in the afternoon other than that no complaints  Past Medical History:  Diagnosis Date  . Anxiety   . Benign prostatic hyperplasia   . Bilateral foot pain   . CHF (congestive heart failure) ([facility])   . Depression   . Diverticulitis   . DJD (degenerative joint disease), cervical   . DNR (do not resuscitate)   . Esophageal dysmotility    age related per BPE 04/2015  . Esophageal stricture   . Falling   . GERD (gastroesophageal reflux disease)   . Gout   . Hiatal hernia   . History of recurrent TIAs   . HTN (hypertension)   . Hyperlipidemia   . Inguinal hernia   . Ischemic heart disease   . Kidney stone   . Osteoarthritis    bilat knees  . Pleural effusion   . Reflux   . Restless leg syndrome   . Right hip pain   . Skin cancer, basal cell   . Urinary retention         Past Surgical History:  Procedure Laterality Date  . ESOPHAGEAL DILATION    . EXPLORATORY  LAPAROTOMY W/ BOWEL RESECTION    . FRACTURE SURGERY    . HEMIARTHROPLASTY HIP     Dr. Aline Brochure  . intestines      reports that  has never smoked. he has never used smokeless tobacco. He reports that he does not drink alcohol or use drugs. Social History        Socioeconomic History  . Marital status: Married    Spouse name: Not on file  . Number of children: Not on file  . Years of education: na  . Highest education level: Not on  file  Social Needs  . Financial resource strain: Not on file  . Food insecurity - worry: Not on file  . Food insecurity - inability: Not on file  . Transportation needs - medical: Not on file  . Transportation needs - non-medical: Not on file  Occupational History  . Occupation: retired  Tobacco Use  . Smoking status: Never Smoker  . Smokeless tobacco: Never Used  Substance and Sexual Activity  . Alcohol use: No    Alcohol/week: 0.0 oz  . Drug use: No  . Sexual activity: Not on file  Other Topics Concern  . Not on file  Social History Narrative  . Not on file    Functional Status Survey:       Family History  Problem Relation Age of Onset  . Arthritis Unknown         Health Maintenance  Topic Date Due  . PNA vac Low Risk Adult (2 of 2 - PCV13) 01/14/2017 (Originally 11/24/2016)  . TETANUS/TDAP  11/11/2026  . INFLUENZA VACCINE  Completed         Allergies  Allergen Reactions  . Celebrex [Celecoxib] Other (See Comments)    Unknown; patient can't remember  . Codeine Other (See Comments)    nausea        Allergies as of 01/09/2017      Reactions   Celebrex [celecoxib] Other (See Comments)   Unknown; patient can't remember   Codeine Other (See Comments)   nausea              Medication List    aspirin EC 81 MG tablet Take 81 mg by mouth daily.   BIOFREEZE 4 % Gel Generic drug:  Menthol (Topical Analgesic) Apply small to back as needed for pain   docusate sodium 100 MG capsule Commonly known as:  COLACE Take 1 capsule (100 mg total) by mouth 2 (two) times daily. For constipation.   guaiFENesin 600 MG 12 hr tablet Commonly known as:  MUCINEX Take 600 mg 2 (two) times daily as needed by mouth for cough or to loosen phlegm.   HYDROcodone-acetaminophen 5-325 MG tablet Commonly known as:  NORCO/VICODIN Take one tablet by mouth twice daily and one tablet by mouth every 6 hours as needed for pain. DNE 3gm of APAP/24hrs     ipratropium-albuterol 0.5-2.5 (3) MG/3ML Soln Commonly known as:  DUONEB Take 3 mLs every 6 (six) hours as needed by nebulization (for shortness of breath).   magnesium hydroxide 400 MG/5ML suspension Commonly known as:  MILK OF MAGNESIA Take 30 mLs by mouth daily as needed for mild constipation. If no relief try a Fleets enema, after 2 days.   omeprazole 20 MG capsule Commonly known as:  PRILOSEC Take 20 mg by mouth daily.   Propylene Glycol 0.6 % Soln Apply 1 drop to both eyes twice a day   rOPINIRole 2 MG tablet Commonly known as:  REQUIP Take 2 mg by mouth at bedtime.   sertraline 25 MG tablet Commonly known as:  ZOLOFT Take 37.5 mg by mouth daily.   Vitamin D 2000 units Caps Take 1 capsule by mouth daily.      Review of systems.  In general is not complaining of any fever chills weight appears to be stable.  Skin does not complain of rashes itching.  Head ears eyes nose mouth and throat no complaints of sore throat or visual changes.  He is somewhat hard of hearing   Respiratory does not complain of shortness of breath or cough.  Cardiac denies chest pain has some mild lower extremity edema.  GI does not complain of abdominal discomfort nausea vomiting diarrhea constipation.  GU continues with indwelling Foley catheter is not complaining of dysuria.  Musculoskeletal does not complain of joint pain currently he did receive a Norco at 4 PM which apparently is helping--per nursing it tends to have some pain later in the afternoon.  Neurologic is not complaining of dizziness headache or syncope but does have a history of restless legs on Requip.  Psych does not complain of depression or anxiety depression appears to be well controlled.  Physical exam.  He is afebrile pulse is 60 respirations 18 blood pressure 110/54.  Weight is stable at 168.8 pounds  In general this is a very pleasant elderly male in no distress sitting comfortably in his chair he  was listening to television with his headphones when I entered the room.  His skin is warm and dry he does have numerous solar-induced changes which appear to be chronic.  Eyes pupils appear to be reactive light sclera and conjunctive are clear visual acuity appears to be intact at baseline  Oropharynx is clear mucous membranes moist  Chest is clear to auscultation with somewhat shallow air entry there is no labored breathing.  Heart is regular rate and rhythm without murmur gallop or rub.  Abdomen is soft nontender he does have a large abdominal hernia on the right which is baseline-bowel sounds are positive abdomen is nontender.  Musculoskeletal has general frailty but is able to move all extremities at baseline he has some mild pedal edema  Show he is alert and oriented pleasant and appropriate neurologic is grossly intact his speech is clear  No lateralizing findings   psych he is alert and oriented pleasant and appropriate   Labs.  January 09, 2017.  Sodium 133 potassium 4.5 BUN 13 creatinine 1.01. January 06, 2017 is WBC 6.6 hemoglobin 10.0 platelets 185 assessment and plan.  January 02, 2017-liver function tests within normal limits except ALT minimally depressed at 14.   May 12, 2016-Vitamin D level 43.8     is is assessment and plan.  1.  History of aspiration pneumonia he has made a good recovery from this status post antibiotic at this point will monitor he continues on Mucinex as well as duo nebs as needed.  2.  History of osteoarthritic pain appears he has more pain later in the afternoon we will switch his routine Norco to late afternoon and make his at bedtime dose as needed .    Continues on Norco in the morning as well.  3.  Hypertension this appears stable currently on no medications has had hypotension    #4-history of CHF this appears clinically stable edema appears to be relatively baseline weights been stable rtension in the past   Is not  complaining of any chest pain or shortness  of breath he is no longer on Lasix secondary to hypotension concerns.  5 oanemia this appears stable with a hemoglobin of 10.0 on lab in late November will monitor periodically.  6.  History of depression this is stable on Zoloft at one point he expressed depressive thoughts but these were quite limited and he appears to be in very good spirits here for an extended period of time.  7.  History of renal insufficiency this actually appears improved on recent lab at 1.01 creatinine will monitor periodically.  8.  History of urinary retention he appears to be tolerating his chronic Foley catheter well-he is no longer on Flomax secondary to hypotension concerns.  9.  History of GERD this appears stable on a proton pump inhibitor.   #10 history of transitory A. fib he has had some history of A. fib in the past but this appears to be intermittent I suspect possibly stress or infection related he had a short bout in the hospital but there has been no reoccurrence at this point will monitor.  #11 history of restless leg Requip appears to be effective at night he has not complained of this in some   time  #12-history of peripheral vascular disease-again his wounds have healed -will monitor he does not want aggressive workup or interventions--he is on aspirin   [id number]--of note greater than 35 minutes spent assessing patient-reviewing his chart-reviewing his labs- discussing his status with nursing staff- and coordinating and formulating a plan of care for numerous diagnoses-of note greater than 50% of time spent coordinating plan of care

## 2017-02-09 ENCOUNTER — Encounter: Payer: Self-pay | Admitting: Internal Medicine

## 2017-02-10 ENCOUNTER — Other Ambulatory Visit: Payer: Self-pay

## 2017-02-10 MED ORDER — HYDROCODONE-ACETAMINOPHEN 5-325 MG PO TABS: ORAL_TABLET | ORAL | 0 refills | Status: DC

## 2017-02-10 NOTE — Telephone Encounter (Signed)
RX Fax for [facility]@ [phone]  

## 2017-02-15 ENCOUNTER — Other Ambulatory Visit: Payer: Self-pay

## 2017-02-15 MED ORDER — HYDROCODONE-ACETAMINOPHEN 5-325 MG PO TABS: ORAL_TABLET | ORAL | 0 refills | Status: DC

## 2017-02-15 NOTE — Telephone Encounter (Signed)
RX Fax for [facility]@ [phone]  

## 2017-02-16 DIAGNOSIS — I739 Peripheral vascular disease, unspecified: Secondary | ICD-10-CM | POA: Diagnosis not present

## 2017-02-16 DIAGNOSIS — R262 Difficulty in walking, not elsewhere classified: Secondary | ICD-10-CM | POA: Diagnosis not present

## 2017-02-16 DIAGNOSIS — B351 Tinea unguium: Secondary | ICD-10-CM | POA: Diagnosis not present

## 2017-03-07 ENCOUNTER — Encounter: Payer: Self-pay | Admitting: Internal Medicine

## 2017-03-07 ENCOUNTER — Non-Acute Institutional Stay (SKILLED_NURSING_FACILITY): Payer: Medicare Other | Admitting: Internal Medicine

## 2017-03-07 DIAGNOSIS — G2581 Restless legs syndrome: Secondary | ICD-10-CM | POA: Diagnosis not present

## 2017-03-07 DIAGNOSIS — J690 Pneumonitis due to inhalation of food and vomit: Secondary | ICD-10-CM | POA: Diagnosis not present

## 2017-03-07 DIAGNOSIS — F329 Major depressive disorder, single episode, unspecified: Secondary | ICD-10-CM

## 2017-03-07 DIAGNOSIS — I739 Peripheral vascular disease, unspecified: Secondary | ICD-10-CM | POA: Diagnosis not present

## 2017-03-07 DIAGNOSIS — D509 Iron deficiency anemia, unspecified: Secondary | ICD-10-CM | POA: Diagnosis not present

## 2017-03-07 DIAGNOSIS — M1990 Unspecified osteoarthritis, unspecified site: Secondary | ICD-10-CM

## 2017-03-07 DIAGNOSIS — F32A Depression, unspecified: Secondary | ICD-10-CM

## 2017-03-07 DIAGNOSIS — I509 Heart failure, unspecified: Secondary | ICD-10-CM | POA: Diagnosis not present

## 2017-03-07 NOTE — Progress Notes (Signed)
Location:    [place] Room Number: 139/D Place of Service:  SNF (31) Provider:  [place] Lewis  Patient, [name]  Patient Care Team: Patient, [name] as PCP - General (General Practice) Danie Binder, MD as Consulting Physician (Gastroenterology) [name], MD as Consulting Physician [facility])  Extended Emergency Contact Information Primary Emergency Contact: Black,Shirley Address: [address]          [place], [place] [address] [name] of [place] Phone: [phone] Mobile Phone: [phone] Relation: Daughter Secondary Emergency Contact: [facility] Phone: [phone] Relation: Niece  Code Status:  DNR Goals of care: Advanced Directive information Advanced Directives 03/07/2017  Does Patient Have a Medical Advance Directive? Yes  Type of Advance Directive Out of facility DNR (pink MOST or yellow form)  Does patient want to make changes to medical advance directive? No - Patient declined  Copy of [place] in Chart? No - copy requested  Pre-existing out of facility DNR order (yellow form or pink MOST form) -    Chief complaint-routine visit for medical management of chronic medical conditions including osteoarthritis-CHF- hypertension-anemia-depression-GERD-history of urinary retention chronic Foley catheter- peripheral vascular disease-history of aspiration pneumonia with dysphagia- also acute visit secondary to mouth discomfort   HPI:  Pt is a 84 y.o. male seen today for medical management of chronic diseases.  As noted above.  He continues to be fairly remarkably stable considering his advanced age.  His weight is stable at about 170 pounds apparently eats and drinks quite well.  He has no acute complaints today he did have some mouth pain over the weekend we did order Magic mouthwash via phone and he says the mouth pain is significantly improved today.  He was most recently hospitalized with a  significantly elevated white count-he was found to have most likely aspiration pneumonia treated with vancomycin and Zosyn-and he is on a dysphagia diet.  He was also found to be in atrial fibrillation with rapid ventricular response but this apparently resolved without aggressive intervention and has been stable since his discharge.  At one point he complained of pain especially later in the day we changed his Norco to routinely in the morning as well as 4 PM and continued every 6 hours as needed this appears to be helping he does complain of some toe pain at night and I did encourage him to ask for his pain medication and he expressed understanding  He does have a history of gout but I did not see any evidence of that on exam today.  In regards to other medical issuessee at one point he had hypertension but is not on any type of hypertensive secondary to some hypotension- actually his Flomax was DC'd because of hypotension concerns-I got a manual blood pressure of 104/60 today as he previous listed reading out at 104/56-also reading of 90/47 I suspect this was done by machine he does not complain of any syncope or dizziness  Regards to CHF his weight is stable he has minimal edema he does not appear to be symptomatic at this time without complaints of any shortness of breath or chest pain.   In regards to peripheral vascular disease this may be contributing to his toe pain-he had significant wounds in the past but these have healed- but he has refused any aggressive intervention for this- at this point symptoms appear to be quite mild.  He does have an indwelling Foley catheter with a history  of urinary retention this has been stable and tolerated well again his Flomax has been discontinued because of hypotension concerns  Of note at one point he had significant lower extremity cellulitis and was treated with Cipro and doxycycline but  Experienced significant dysphagia recommendation is to avoid  combination of Cipro and doxycycline in the future       Past Medical History:  Diagnosis Date  . Anxiety   . Benign prostatic hyperplasia   . Bilateral foot pain   . CHF (congestive heart failure) ([place])   . Depression   . Diverticulitis   . DJD (degenerative joint disease), cervical   . DNR (do not resuscitate)   . Esophageal dysmotility    age related per BPE 04/2015  . Esophageal stricture   . Falling   . GERD (gastroesophageal reflux disease)   . Gout   . Hiatal hernia   . History of recurrent TIAs   . HTN (hypertension)   . Hyperlipidemia   . Inguinal hernia   . Ischemic heart disease   . Kidney stone   . Osteoarthritis    bilat knees  . Pleural effusion   . Reflux   . Restless leg syndrome   . Right hip pain   . Skin cancer, basal cell   . Urinary retention    Past Surgical History:  Procedure Laterality Date  . ESOPHAGEAL DILATION    . EXPLORATORY LAPAROTOMY W/ BOWEL RESECTION    . FRACTURE SURGERY    . HEMIARTHROPLASTY HIP     Dr. Aline Brochure  . intestines      Allergies  Allergen Reactions  . Celebrex [Celecoxib] Other (See Comments)    Unknown; patient can't remember  . Codeine Other (See Comments)    nausea    Outpatient Encounter Medications as of 03/07/2017  Medication Sig  . aspirin EC 81 MG tablet Take 81 mg by mouth daily.  . Cholecalciferol (VITAMIN D) 2000 UNITS CAPS Take 1 capsule by mouth daily.  Marland Kitchen docusate sodium (COLACE) 100 MG capsule Take 1 capsule (100 mg total) by mouth 2 (two) times daily. For constipation.  Marland Kitchen guaiFENesin (MUCINEX) 600 MG 12 hr tablet Take 600 mg 2 (two) times daily as needed by mouth for cough or to loosen phlegm.   Marland Kitchen HYDROcodone-acetaminophen (NORCO/VICODIN) 5-325 MG tablet Give 1 tablet by mouth once a day for pain. Take 1 tablet by mouth every 6 hours prn for  pain  . HYDROcodone-acetaminophen (NORCO/VICODIN) 5-325 MG tablet Take 1 tablet by mouth every evening.  Marland Kitchen ipratropium-albuterol (DUONEB) 0.5-2.5 (3)  MG/3ML SOLN Take 3 mLs every 6 (six) hours as needed by nebulization (for shortness of breath).  . magic mouthwash SOLN Take 5 mLs by mouth 4 (four) times daily.  . magnesium hydroxide (MILK OF MAGNESIA) 400 MG/5ML suspension Take 30 mLs by mouth daily as needed for mild constipation. If no relief try a Fleets enema, after 2 days.  . Menthol, Topical Analgesic, (BIOFREEZE) 4 % GEL Apply small to back as needed for pain  . omeprazole (PRILOSEC) 20 MG capsule Take 20 mg by mouth daily.  Marland Kitchen Propylene Glycol 0.6 % SOLN Apply 1 drop to both eyes twice a day  . rOPINIRole (REQUIP) 2 MG tablet Take 2 mg by mouth at bedtime.  . sertraline (ZOLOFT) 25 MG tablet Take 37.5 mg by mouth daily.   . [DISCONTINUED] HYDROcodone-acetaminophen (NORCO/VICODIN) 5-325 MG tablet Take one tablet by mouth twice daily and one tablet by mouth every 6 hours as  needed for pain. DNE 3gm of APAP/24hrs (Patient taking differently: Take one tablet by mouth once daily and one tablet by mouth every 6 hours as needed for pain. DNE 3gm of APAP/24hrs)   No facility-administered encounter medications on file as of 03/07/2017.      Review of Systems   In general is not complaining any fever or chills his weight appears to be relatively stable.  Skin does not complain of rashes or itching does have numerous areas or skin cancer was removed including on his left upper ear he has somewhat of a residual scabbing here and says it feels irritated at times he is applying topical cream.  Head ears eyes nose mouth and throat is not complaining of any visual changes or difficulty swallowing currently.  Respiratory denies shortness of breath or cough.  Cardiac continues to deny any chest pain edema appears to be fairly minimal and well controlled.  GI does not complain of any abdominal pain nausea vomiting diarrhea constipation.  GU does have a history of urinary retention with a chronic indwelling Foley catheter which he is tolerated quite  well.  Musculoskeletal does not complain of joint pain at this time does have general osteoarthritic pain and does complain at times of toe pain as noted above.  Neurologic does not complain of dizziness headache or syncope.  And psych does not complain of any depression or anxiety at one point thought to be depressed he has been placed on Zoloft and this appears to be very stable  Immunization History  Administered Date(s) Administered  . Influenza,inj,Quad PF,6+ Mos 12/07/2014  . Influenza-Unspecified 11/20/2013, 11/13/2015, 11/18/2016  . PPD Test 03/16/2013  . Pneumococcal-Unspecified 02/14/2009, 11/25/2015  . Tdap 11/10/2016   Pertinent  Health Maintenance Due  Topic Date Due  . PNA vac Low Risk Adult (2 of 2 - PCV13) 04/07/2017 (Originally 11/24/2016)  . INFLUENZA VACCINE  Completed   Fall Risk  11/10/2016  Falls in the past year? No   Functional Status Survey:    Vitals:   03/07/17 1455  BP: (!) 90/47  Pulse: 88  Resp: 20  Temp: (!) 97.5 F (36.4 C)  TempSrc: Oral  SpO2: 97%  Weight: 170 lb 12.8 oz (77.5 kg)  Height: 6' (1.829 m)  Of note manual blood pressure was 104/60 Body mass index is 23.16 kg/m. Physical Exam   In general this is a very pleasant elderly male in no distress sitting comfortably in his wheelchair watching television.  His skin is warm and dry he does have a small scab area where recent carcinoma was removed on his outer left ear-I do not really see signs of infection here.  Eyes sclera and conjunctive are clear visual acuity appears grossly intact he has prescription lenses.  Oropharynx is clear mucous membranes moist I do not see any open sores or blisters  Chest is clear to auscultation there is no labored breathing air entry has somewhat shallow.  Heart is regular rate and rhythm without murmur gallop or rub heart sounds are somewhat distant he has minimal lower extremity edema.  Abdomen is somewhat protuberant with a large  right-sided abdominal hernia-abdomen is soft it is nontender has positive bowel sounds.  GU he does have an indwelling Foley catheter draining amber colored urine.  Musculoskeletal ambulates with mostly in his wheelchair he has arthritic changes of his knees bilaterally and some general frailty but is doing remarkably well for his age.  Lower extremity edema is quite minimal.  I  do not show sign of infection I do not see significant edema or erythema or tenderness of his left great toe  Neurologic is grossly intact his speech is clear no lateralizing findings.  Psych he is alert and oriented pleasant and appropriate    Labs reviewed: Recent Labs    01/06/2017 0446 01/06/17 0730 01/09/17 0400  NA 132* 132* 133*  K 3.9 3.7 4.5  CL 102 100* 99*  CO2 23 23 27   GLUCOSE 92 112* 94  BUN 16 16 13   CREATININE 1.12 1.36* 1.01  CALCIUM 8.4* 8.3* 8.5*   Recent Labs    04/13/16 2220 04/14/16 0544 01/02/17 1613  AST 23 17 26   ALT 19 16* 14*  ALKPHOS 80 75 87  BILITOT 0.6 0.7 0.8  PROT 7.0 6.8 7.4  ALBUMIN 2.8* 2.6* 3.5   Recent Labs    11/29/16 0730 01/02/17 1613 01/03/17 0314 01/01/2017 0446 01/06/17 0730  WBC 7.4 38.2* 19.0* 10.0 6.6  NEUTROABS 4.3 34.4*  --   --  3.4  HGB 11.2* 10.9* 9.5* 10.0* 10.0*  HCT 34.7* 34.9* 29.5* 32.2* 32.5*  MCV 87.0 87.9 87.3 87.3 87.8  PLT 247 199 160 162 185   Lab Results  Component Value Date   TSH 1.323 04/13/2016   Lab Results  Component Value Date   HGBA1C 6.1 (H) 11/12/2014   No results found for: CHOL, HDL, LDLCALC, LDLDIRECT, TRIG, CHOLHDL  Significant Diagnostic Results in last 30 days:  No results found.  Assessment/Plan  #1 history of osteoarthritis this appears relatively well controlled-he is on Norco routinely in the morning as well at 4 PM as well as every 6 hours as needed he appears to be tolerating this well without signs of respiratory issues or sedation-he also has Biofreeze topical if needed has been  encouraged to ask for this especially at night when he has some toe pain:   #2 history of CHF this appears stable despite not being on Lasix he has minimal edema does not complain of increased cough from baseline or shortness of breath at this point will monitor again concerns with initiating Lasix with his history of low blood pressures.  3.  Hypertension as noted above hypotension more of a recent concern but he is been stable in this regards recently manual blood pressure today 104/60.  4.  History of aspiration pneumonia with dysphagia-this is been stable he does have duo nebs and Mucinex as needed there is been no further evidence of this occasionally will have a cough but this apparently is transitory and not accompanied with shortness of breath or change in clinical status.  .  5.  History of mouth pain--physical exam was quite benign today Magic mouthwash per patient is really helping.  6.  History of urinary retention with Foley catheter again he continues to tolerate this quite well.  7.  History of anemia this is stable with a hemoglobin of 10.0 on lab done in late November this is been stable for some time now.  8.  History of depression this is well was stable at one point he expressed depressive thoughts but this was quite short duration and since he was started Zoloft there has been no reoccurrence continues to be bright alert engaged--- watches TV and reads often.  9.  History of restless leg syndrome he is on Requip at night he is not really complaining of this today and has not for some time.  10.  History of GERD this is been  relatively asymptomatic he does have a history of a large abdominal hernia but does well on a PPI.  11.  History of peripheral vascular disease again he does complain at times of some night pain toe pain is been encouraged to ask for his pain medication however will monitor for now he is on aspirin as well.  [id number] note greater than 40 minutes  spent assessing patient discharge-review of his labs-and chart-is 1 discussing his status with nursing staff- and coordinating and formulating a plan of care for numerous diagnoses-note  greater than 50% of time spent coordinating plan of care

## 2017-03-16 ENCOUNTER — Encounter: Payer: Self-pay | Admitting: Internal Medicine

## 2017-03-16 ENCOUNTER — Non-Acute Institutional Stay (SKILLED_NURSING_FACILITY): Payer: Medicare Other | Admitting: Internal Medicine

## 2017-03-16 DIAGNOSIS — H6192 Disorder of left external ear, unspecified: Secondary | ICD-10-CM

## 2017-03-16 DIAGNOSIS — L989 Disorder of the skin and subcutaneous tissue, unspecified: Secondary | ICD-10-CM

## 2017-03-16 NOTE — Progress Notes (Signed)
this is an acute visit.  Level of care skilled.  Facility is [facility].  Chief complaint acute visit follow-up left outer ear issues with history of carcinoma.  History of present illness.  Patient is a very pleasant 84 year old male with a history of extensive sun exposure and frequent skin cancer removals he is followed by Dr. Nevada Crane dermatologist.  Patient is recently had a lesion removed from his outer left earlobe- and has had some crusting and scabbing but the scabbing apparently has come off at times and it appears irritated and this is bothering the patient he would like to have another visit with Dr. Nevada Crane.  He does not complain of any decreased hearing from baseline and otherwise appears to be doing remarkably well.  In regards to this area on his left ear at one-point he actually did receive an antibiotic before seeing Dr. Nevada Crane for concerns of cellulitis Dr. Nevada Crane subsequently did remove the lesion  Past Medical History:  Diagnosis Date  . Anxiety   . Benign prostatic hyperplasia   . Bilateral foot pain   . CHF (congestive heart failure) ([place])   . Depression   . Diverticulitis   . DJD (degenerative joint disease), cervical   . DNR (do not resuscitate)   . Esophageal dysmotility    age related per BPE 04/2015  . Esophageal stricture   . Falling   . GERD (gastroesophageal reflux disease)   . Gout   . Hiatal hernia   . History of recurrent TIAs   . HTN (hypertension)   . Hyperlipidemia   . Inguinal hernia   . Ischemic heart disease   . Kidney stone   . Osteoarthritis    bilat knees  . Pleural effusion   . Reflux   . Restless leg syndrome   . Right hip pain   . Skin cancer, basal cell   . Urinary retention         Past Surgical History:  Procedure Laterality Date  . ESOPHAGEAL DILATION    . EXPLORATORY LAPAROTOMY W/ BOWEL RESECTION    . FRACTURE SURGERY    . HEMIARTHROPLASTY HIP     Dr. Aline Brochure  . intestines            Allergies  Allergen Reactions  . Celebrex [Celecoxib] Other (See Comments)    Unknown; patient can't remember  . Codeine Other (See Comments)    nausea            Sig  . aspirin EC 81 MG tablet Take 81 mg by mouth daily.  . Cholecalciferol (VITAMIN D) 2000 UNITS CAPS Take 1 capsule by mouth daily.  Marland Kitchen docusate sodium (COLACE) 100 MG capsule Take 1 capsule (100 mg total) by mouth 2 (two) times daily. For constipation.  Marland Kitchen guaiFENesin (MUCINEX) 600 MG 12 hr tablet Take 600 mg 2 (two) times daily as needed by mouth for cough or to loosen phlegm.   Marland Kitchen HYDROcodone-acetaminophen (NORCO/VICODIN) 5-325 MG tablet Give 1 tablet by mouth once a day for pain. Take 1 tablet by mouth every 6 hours prn for  pain  . HYDROcodone-acetaminophen (NORCO/VICODIN) 5-325 MG tablet Take 1 tablet by mouth every evening.  Marland Kitchen ipratropium-albuterol (DUONEB) 0.5-2.5 (3) MG/3ML SOLN Take 3 mLs every 6 (six) hours as needed by nebulization (for shortness of breath).  . magic mouthwash SOLN Take 5 mLs by mouth 4 (four) times daily.  . magnesium hydroxide (MILK OF MAGNESIA) 400 MG/5ML suspension Take 30 mLs by mouth daily as  needed for mild constipation. If no relief try a Fleets enema, after 2 days.  . Menthol, Topical Analgesic, (BIOFREEZE) 4 % GEL Apply small to back as needed for pain  . omeprazole (PRILOSEC) 20 MG capsule Take 20 mg by mouth daily.  Marland Kitchen Propylene Glycol 0.6 % SOLN Apply 1 drop to both eyes twice a day  . rOPINIRole (REQUIP) 2 MG tablet Take 2 mg by mouth at bedtime.  . sertraline (ZOLOFT) 25 MG tablet Take 37.5 mg by mouth daily.   . [DISCONTINUED] HYDROcodone-acetaminophen (NORCO/VICODIN) 5-325 MG tablet Take one tablet by mouth twice daily and one tablet by mouth every 6 hours as needed for pain. DNE 3gm of APAP/24hrs (Patient taking differently: Take one tablet by mouth once daily and one tablet by mouth every 6 hours as needed for pain. DNE 3gm of APAP/24hrs)   No  facility-administered encounter medications on file as of 03/07/2017.     Review of systems. In general he is not complaining of any fever or chills.  Says he is feeling relatively well he is watching the news on TV  Skin again does feel some irritation of the left helix earlobe- he does not really complain of pain however but is concerned because a scab keeps coming off.  Ears he is hard of hearing but this appears to be baseline.  Respiratory is not complaining any shortness of breath or cough.  Cardiac denies chest pain.  Neurologic is denying any headache or dizziness  Physical exam.  Is afebrile respirations are 18.  In general this is a somewhat frail elderly male who looks remarkably well for his age.  His skin is warm and dry he does have scattered numerous solar-induced changes with numerous removals of lesions-on his left helix there is a small scabbed area on the superior portion there is no bleeding or drainage there is minimal erythema this appears to be more irritation than true cellulitis-there is some crusting which is dark.  Ears hearing appears to be at baseline he was actually wearing headphones when I entered the room listening to TV he did not appear to have difficulty hearing me when I talked to him in somewhat raised voice which I normally do  Musculoskeletal he is able to move all extremities at baseline he ambulates in a wheelchair.  Neurologic is grossly intact his speech is clear  Psych he continues to be largely alert and oriented pleasant and appropriate    Labs. January 09, 2017.  Sodium 133 potassium 4.5 BUN 13 creatinine 1.01.  January 06, 2017.  WBC 6.6 hemoglobin 10.0 platelets 185   Assessment and plan.  1.  History of carcinoma of the left ear-the area does appear to be irritated but I do not really see infection at this time there is some scabbing and dark crusting- apparently does feel irritated and does bother patient he would  like to see the dermatologist and will write an order for Dr. Nevada Crane to reevaluate this.  [id number]

## 2017-03-17 ENCOUNTER — Other Ambulatory Visit: Payer: Self-pay

## 2017-03-17 MED ORDER — HYDROCODONE-ACETAMINOPHEN 5-325 MG PO TABS: ORAL_TABLET | ORAL | 0 refills | Status: DC

## 2017-03-17 NOTE — Telephone Encounter (Signed)
RX Fax for [facility]@ [phone]  

## 2017-03-23 DIAGNOSIS — L8990 Pressure ulcer of unspecified site, unspecified stage: Secondary | ICD-10-CM | POA: Diagnosis not present

## 2017-03-23 DIAGNOSIS — L0100 Impetigo, unspecified: Secondary | ICD-10-CM | POA: Diagnosis not present

## 2017-04-06 ENCOUNTER — Encounter: Payer: Self-pay | Admitting: Internal Medicine

## 2017-04-06 ENCOUNTER — Non-Acute Institutional Stay (SKILLED_NURSING_FACILITY): Payer: Medicare Other | Admitting: Internal Medicine

## 2017-04-06 DIAGNOSIS — I10 Essential (primary) hypertension: Secondary | ICD-10-CM | POA: Diagnosis not present

## 2017-04-06 DIAGNOSIS — I509 Heart failure, unspecified: Secondary | ICD-10-CM | POA: Diagnosis not present

## 2017-04-06 DIAGNOSIS — M1A9XX0 Chronic gout, unspecified, without tophus (tophi): Secondary | ICD-10-CM

## 2017-04-06 DIAGNOSIS — C44229 Squamous cell carcinoma of skin of left ear and external auricular canal: Secondary | ICD-10-CM | POA: Diagnosis not present

## 2017-04-06 NOTE — Progress Notes (Signed)
Location:   [place] Room Number: 139/D Place of Service:  SNF (31) Provider:  [place]ville Lewis  Patient, [name]  Patient Care Team: Patient, [name] as PCP - General (General Practice) Danie Binder, MD as Consulting Physician (Gastroenterology) [name], MD as Consulting Physician [facility])  Extended Emergency Contact Information Primary Emergency Contact: Black,Shirley Address: [address]          [place], [place] [address] [name] of [place] Phone: [phone] Mobile Phone: [phone] Relation: Daughter Secondary Emergency Contact: [facility] Phone: [phone] Relation: Niece  Code Status:  DNR Goals of care: Advanced Directive information Advanced Directives 04/06/2017  Does Patient Have a Medical Advance Directive? Yes  Type of Advance Directive Out of facility DNR (pink MOST or yellow form)  Does patient want to make changes to medical advance directive? No - Patient declined  Copy of [place] in Chart? No - copy requested  Pre-existing out of facility DNR order (yellow form or pink MOST form) -     Chief Complaint  Patient presents with  . Acute Visit    Patients c/o Left great toe pain    HPI:  Pt is a 84 y.o. male seen today for an acute visit for complaints of left great toe pain more so at night.  Patient is followed closely by Dr. Nevada Crane from dermatology for numerous skin issues and actually saw him today.  Is completing a course of Cipro for suspected infection of his left ear with a history of squamous cell carcinoma-- she also had a partial biopsy today of the left helix.  He also checked the left great toe with ulceration again this was treated with several.  It appears this area had improved but had some continued pain apparently more so at night and Dr. Nevada Crane recommended possibly a medication for gout with suspicious this may be contributing to the discomfort--.  He does have  a history of gout and uric acids have consistently been in the high normal range at 6.6.  Currently is not complaining of any pain in the toe but apparently it does start to hurt at night.  His other medical issues include osteoarthritis CHF hypertension anemia depression GERD urinary retention with chronic Foley catheter and peripheral vascular disease all these appear to be stable at this point   he has done remarkably well considering his advanced age  His only complaint today is occasional left great toe second toe pain at night   Past Medical History:  Diagnosis Date  . Anxiety   . Benign prostatic hyperplasia   . Bilateral foot pain   . CHF (congestive heart failure) ([place])   . Depression   . Diverticulitis   . DJD (degenerative joint disease), cervical   . DNR (do not resuscitate)   . Esophageal dysmotility    age related per BPE 04/2015  . Esophageal stricture   . Falling   . GERD (gastroesophageal reflux disease)   . Gout   . Hiatal hernia   . History of recurrent TIAs   . HTN (hypertension)   . Hyperlipidemia   . Inguinal hernia   . Ischemic heart disease   . Kidney stone   . Osteoarthritis    bilat knees  . Pleural effusion   . Reflux   . Restless leg syndrome   . Right hip pain   . Skin cancer, basal cell   . Urinary retention  Past Surgical History:  Procedure Laterality Date  . ESOPHAGEAL DILATION    . EXPLORATORY LAPAROTOMY W/ BOWEL RESECTION    . FRACTURE SURGERY    . HEMIARTHROPLASTY HIP     Dr. Aline Brochure  . intestines      Allergies  Allergen Reactions  . Celebrex [Celecoxib] Other (See Comments)    Unknown; patient can't remember  . Codeine Other (See Comments)    nausea    Outpatient Encounter Medications as of 04/06/2017  Medication Sig  . aspirin EC 81 MG tablet Take 81 mg by mouth daily.  . Cholecalciferol (VITAMIN D) 2000 UNITS CAPS Take 1 capsule by mouth daily.  . ciprofloxacin (CIPRO) 500 MG tablet Take 500 mg by mouth daily.    Marland Kitchen docusate sodium (COLACE) 100 MG capsule Take 1 capsule (100 mg total) by mouth 2 (two) times daily. For constipation.  Marland Kitchen guaiFENesin (MUCINEX) 600 MG 12 hr tablet Take 600 mg 2 (two) times daily as needed by mouth for cough or to loosen phlegm.   Marland Kitchen HYDROcodone-acetaminophen (NORCO/VICODIN) 5-325 MG tablet Take 1 tablet by mouth every evening.  Marland Kitchen HYDROcodone-acetaminophen (NORCO/VICODIN) 5-325 MG tablet Give 1 tablet by mouth once a day for pain. Take 1 tablet by mouth every 6 hours prn for  pain  . ipratropium-albuterol (DUONEB) 0.5-2.5 (3) MG/3ML SOLN Take 3 mLs every 6 (six) hours as needed by nebulization (for shortness of breath).  . magnesium hydroxide (MILK OF MAGNESIA) 400 MG/5ML suspension Take 30 mLs by mouth daily as needed for mild constipation. If no relief try a Fleets enema, after 2 days.  . Menthol, Topical Analgesic, (BIOFREEZE) 4 % GEL Apply small to back as needed for pain  . omeprazole (PRILOSEC) 20 MG capsule Take 20 mg by mouth daily.  . Probiotic Product (RISA-BID PROBIOTIC) TABS Take 1 tablet by mouth twice a day  . Propylene Glycol 0.6 % SOLN Apply 1 drop to both eyes twice a day  . rOPINIRole (REQUIP) 2 MG tablet Take 2 mg by mouth at bedtime.  . sertraline (ZOLOFT) 25 MG tablet Take 37.5 mg by mouth daily.   . [DISCONTINUED] magic mouthwash SOLN Take 5 mLs by mouth 4 (four) times daily.   No facility-administered encounter medications on file as of 04/06/2017.     Review of Systems   In general he is not complain of any fever or chills.  Skin does not complain of rashes or itching he does have numerous skin issues as noted above followed closely by dermatology.  Head ears eyes nose mouth and throat is not complaining of decreased hearing from baseline or visual changes.  Respiratory is not complaining shortness breath or cough.  Cardiac is not complaining of chest pain edema appears to be fairly minimal.  GI is not complaining of abdominal pain nausea  vomiting diarrhea constipation  GU does not complain of dysuria has an indwelling Foley catheter with history of urinary retention.  Musculoskeletal other than occasional left great toe pain at night does not really have acute complaints.  Neurologic does not complain of headaches dizziness or numbness at this time.  And psych does not complain of overt anxiety or depression he is on Zoloft which appears to be quite effective.    Immunization History  Administered Date(s) Administered  . Influenza,inj,Quad PF,6+ Mos 12/07/2014  . Influenza-Unspecified 11/20/2013, 11/13/2015, 11/18/2016  . PPD Test 03/16/2013  . Pneumococcal-Unspecified 02/14/2009, 11/25/2015  . Tdap 11/10/2016   Pertinent  Health Maintenance Due  Topic Date Due  .  PNA vac Low Risk Adult (2 of 2 - PCV13) 04/07/2017 (Originally 11/24/2016)  . INFLUENZA VACCINE  Completed   Fall Risk  11/10/2016  Falls in the past year? No   Functional Status Survey:     He is afebrile pulse of 64 respirations of 17 blood pressure taken manually 110/52 Physical Exam   In general this is a pleasant elderly male in no distress sitting comfortably in his wheelchair.  His skin is warm and dry on his left helix there is some bandaging-he has numerous solar-induced changes and scaling which again is followed by dermatology with frequent skin cancer removals.  Eyes visual acuity appears grossly intact he has prescription lenses.  Oropharynx is clear mucous membranes moist.  Chest is clear to auscultation with somewhat shallow air entry there is no labored breathing.  His heart is somewhat distant heart sounds regular rate and rhythm without murmur gallop or rub he has got very mild minimal lower extremity edema.  GI-his abdomen is protuberant with a large right-sided abdominal hernia which is   Not  tender to palpation abdomen is soft he does have positive bowel sounds  GU continues with an indwelling Foley catheter draining  amber colored urine  Musculoskeletal has general frailty but is able to move all extremities x4 largely ambulates in wheelchair- left great toe has some mild erythema medial margin also small amount of erythema on the left second toe it is not really tender to palpation this morning but apparently has tenderness at night at times-the left toe does appear to be a bit more swollen compared to the right great toe there is no active drainage--erythema does not really appear to be cellulitic or warm.  Neurologic is grossly intact his speech is clear no lateralizing findings.  Psych he continues to be alert and oriented pleasant and appropriate-    Labs reviewed: Recent Labs    12/18/2016 0446 01/06/17 0730 01/09/17 0400  NA 132* 132* 133*  K 3.9 3.7 4.5  CL 102 100* 99*  CO2 23 23 27   GLUCOSE 92 112* 94  BUN 16 16 13   CREATININE 1.12 1.36* 1.01  CALCIUM 8.4* 8.3* 8.5*   Recent Labs    04/13/16 2220 04/14/16 0544 01/02/17 1613  AST 23 17 26   ALT 19 16* 14*  ALKPHOS 80 75 87  BILITOT 0.6 0.7 0.8  PROT 7.0 6.8 7.4  ALBUMIN 2.8* 2.6* 3.5   Recent Labs    11/29/16 0730 01/02/17 1613 01/03/17 0314 01/12/2017 0446 01/06/17 0730  WBC 7.4 38.2* 19.0* 10.0 6.6  NEUTROABS 4.3 34.4*  --   --  3.4  HGB 11.2* 10.9* 9.5* 10.0* 10.0*  HCT 34.7* 34.9* 29.5* 32.2* 32.5*  MCV 87.0 87.9 87.3 87.3 87.8  PLT 247 199 160 162 185   Lab Results  Component Value Date   TSH 1.323 04/13/2016   Lab Results  Component Value Date   HGBA1C 6.1 (H) 11/12/2014   No results found for: CHOL, HDL, LDLCALC, LDLDIRECT, TRIG, CHOLHDL  Significant Diagnostic Results in last 30 days:  No results found.  Assessment/Plan  #1- left great toe discomfort- suspect there could be a gout etiology ---uric acid appears to be fairly consistently in the high sixes- will startUloric r at 40 mg a day and monitor He has just completed a course of Cipro for suspected left helix secondary infection  As well  ascellulitis of the left great toe --at  this- point does not appear to be an active  infection and hopefully Uloric will add some comfort relief  #2 hypertension this appears stable he does not really have hypotensive systems.  Blood pressure was stable today as noted above systolics usually are in low 100's-is not on any medications at this point-secondary to hypotension concerns.  3.-History of CHF this appears stable as well his edema is minimal he has been taken off his Lasix -weight is stable at 169 is pounds  [id number]

## 2017-04-10 ENCOUNTER — Encounter (HOSPITAL_COMMUNITY)
Admission: RE | Admit: 2017-04-10 | Discharge: 2017-04-10 | Disposition: A | Discharge status: Home / Self Care | Payer: Medicare Other | Source: Skilled Nursing Facility | Attending: Internal Medicine | Admitting: Internal Medicine

## 2017-04-10 DIAGNOSIS — M1000 Idiopathic gout, unspecified site: Secondary | ICD-10-CM | POA: Insufficient documentation

## 2017-04-10 LAB — URIC ACID: Uric Acid, Serum: 6.8 mg/dL (ref 4.4–7.6)

## 2017-04-11 ENCOUNTER — Encounter: Payer: Self-pay | Admitting: Internal Medicine

## 2017-04-11 NOTE — Progress Notes (Signed)
Location:   [place] Room Number: 139/D Place of Service:  SNF (31) Provider:  Veleta Miners  Patient, [name]  Patient Care Team: Patient, [name] as PCP - General (General Practice) Danie Binder, MD as Consulting Physician (Gastroenterology) [name], MD as Consulting Physician [facility])  Extended Emergency Contact Information Primary Emergency Contact: Black,Shirley Address: [address]          [place], [place] [address] [name] of [place] Phone: [phone] Mobile Phone: [phone] Relation: Daughter Secondary Emergency Contact: [facility] Phone: [phone] Relation: Niece  Code Status:  DNR Goals of care: Advanced Directive information Advanced Directives 04/11/2017  Does Patient Have a Medical Advance Directive? Yes  Type of Advance Directive Out of facility DNR (pink MOST or yellow form)  Does patient want to make changes to medical advance directive? No - Patient declined  Copy of [place] in Chart? No - copy requested  Pre-existing out of facility DNR order (yellow form or pink MOST form) -     Chief Complaint  Patient presents with  . Acute Visit    Patients c/o Gout    HPI:  Pt is a 84 y.o. male seen today for an acute visit for    Past Medical History:  Diagnosis Date  . Anxiety   . Benign prostatic hyperplasia   . Bilateral foot pain   . CHF (congestive heart failure) ([place])   . Depression   . Diverticulitis   . DJD (degenerative joint disease), cervical   . DNR (do not resuscitate)   . Esophageal dysmotility    age related per BPE 04/2015  . Esophageal stricture   . Falling   . GERD (gastroesophageal reflux disease)   . Gout   . Hiatal hernia   . History of recurrent TIAs   . HTN (hypertension)   . Hyperlipidemia   . Inguinal hernia   . Ischemic heart disease   . Kidney stone   . Osteoarthritis    bilat knees  . Pleural effusion   . Reflux   . Restless  leg syndrome   . Right hip pain   . Skin cancer, basal cell   . Urinary retention    Past Surgical History:  Procedure Laterality Date  . ESOPHAGEAL DILATION    . EXPLORATORY LAPAROTOMY W/ BOWEL RESECTION    . FRACTURE SURGERY    . HEMIARTHROPLASTY HIP     Dr. Aline Brochure  . intestines      Allergies  Allergen Reactions  . Celebrex [Celecoxib] Other (See Comments)    Unknown; patient can't remember  . Codeine Other (See Comments)    nausea    Outpatient Encounter Medications as of 04/11/2017  Medication Sig  . aspirin EC 81 MG tablet Take 81 mg by mouth daily.  . Cholecalciferol (VITAMIN D) 2000 UNITS CAPS Take 1 capsule by mouth daily.  Marland Kitchen docusate sodium (COLACE) 100 MG capsule Take 1 capsule (100 mg total) by mouth 2 (two) times daily. For constipation.  Marland Kitchen guaiFENesin (MUCINEX) 600 MG 12 hr tablet Take 600 mg 2 (two) times daily as needed by mouth for cough or to loosen phlegm.   Marland Kitchen HYDROcodone-acetaminophen (NORCO/VICODIN) 5-325 MG tablet Take 1 tablet by mouth every evening.  Marland Kitchen HYDROcodone-acetaminophen (NORCO/VICODIN) 5-325 MG tablet Give 1 tablet by mouth once a day for pain. Take 1 tablet by mouth every 6 hours prn for  pain  . ipratropium-albuterol (DUONEB) 0.5-2.5 (3) MG/3ML  SOLN Take 3 mLs every 6 (six) hours as needed by nebulization (for shortness of breath).  . magnesium hydroxide (MILK OF MAGNESIA) 400 MG/5ML suspension Take 30 mLs by mouth daily as needed for mild constipation. If no relief try a Fleets enema, after 2 days.  . Menthol, Topical Analgesic, (BIOFREEZE) 4 % GEL Apply small to back as needed for pain  . omeprazole (PRILOSEC) 20 MG capsule Take 20 mg by mouth daily.  Marland Kitchen Propylene Glycol 0.6 % SOLN Apply 1 drop to both eyes twice a day  . rOPINIRole (REQUIP) 2 MG tablet Take 2 mg by mouth at bedtime.  . sertraline (ZOLOFT) 25 MG tablet Take 37.5 mg by mouth daily.   . [DISCONTINUED] ciprofloxacin (CIPRO) 500 MG tablet Take 500 mg by mouth daily.  .  [DISCONTINUED] Probiotic Product (RISA-BID PROBIOTIC) TABS Take 1 tablet by mouth twice a day   No facility-administered encounter medications on file as of 04/11/2017.      Review of Systems  Immunization History  Administered Date(s) Administered  . Influenza,inj,Quad PF,6+ Mos 12/07/2014  . Influenza-Unspecified 11/20/2013, 11/13/2015, 11/18/2016  . PPD Test 03/16/2013  . Pneumococcal-Unspecified 02/14/2009, 11/25/2015  . Tdap 11/10/2016   Pertinent  Health Maintenance Due  Topic Date Due  . PNA vac Low Risk Adult (2 of 2 - PCV13) 05/09/2017 (Originally 11/24/2016)  . INFLUENZA VACCINE  Completed   Fall Risk  11/10/2016  Falls in the past year? No   Functional Status Survey:    Vitals:   04/11/17 1125  BP: (!) 96/52  Pulse: 89  Resp: 16  Temp: 97.7 F (36.5 C)  TempSrc: Oral  SpO2: 98%   There is no height or weight on file to calculate BMI. Physical Exam  Labs reviewed: Recent Labs    12/29/2016 0446 01/06/17 0730 01/09/17 0400  NA 132* 132* 133*  K 3.9 3.7 4.5  CL 102 100* 99*  CO2 23 23 27   GLUCOSE 92 112* 94  BUN 16 16 13   CREATININE 1.12 1.36* 1.01  CALCIUM 8.4* 8.3* 8.5*   Recent Labs    04/13/16 2220 04/14/16 0544 01/02/17 1613  AST 23 17 26   ALT 19 16* 14*  ALKPHOS 80 75 87  BILITOT 0.6 0.7 0.8  PROT 7.0 6.8 7.4  ALBUMIN 2.8* 2.6* 3.5   Recent Labs    11/29/16 0730 01/02/17 1613 01/03/17 0314 12/16/2016 0446 01/06/17 0730  WBC 7.4 38.2* 19.0* 10.0 6.6  NEUTROABS 4.3 34.4*  --   --  3.4  HGB 11.2* 10.9* 9.5* 10.0* 10.0*  HCT 34.7* 34.9* 29.5* 32.2* 32.5*  MCV 87.0 87.9 87.3 87.3 87.8  PLT 247 199 160 162 185   Lab Results  Component Value Date   TSH 1.323 04/13/2016   Lab Results  Component Value Date   HGBA1C 6.1 (H) 11/12/2014   No results found for: CHOL, HDL, LDLCALC, LDLDIRECT, TRIG, CHOLHDL  Significant Diagnostic Results in last 30 days:  No results found.  Assessment/Plan There are no diagnoses linked to this  encounter.   Family/ staff Communication:   Labs/tests ordered:    This encounter was created in error - please disregard.

## 2017-04-13 ENCOUNTER — Other Ambulatory Visit (HOSPITAL_COMMUNITY)
Admission: RE | Admit: 2017-04-13 | Discharge: 2017-04-13 | Disposition: A | Discharge status: Home / Self Care | Payer: Medicare Other | Source: Skilled Nursing Facility | Attending: Internal Medicine | Admitting: Internal Medicine

## 2017-04-13 ENCOUNTER — Encounter: Payer: Self-pay | Admitting: Internal Medicine

## 2017-04-13 ENCOUNTER — Non-Acute Institutional Stay (SKILLED_NURSING_FACILITY): Payer: Medicare Other | Admitting: Internal Medicine

## 2017-04-13 DIAGNOSIS — R000 Tachycardia, unspecified: Secondary | ICD-10-CM | POA: Diagnosis not present

## 2017-04-13 DIAGNOSIS — M79671 Pain in right foot: Secondary | ICD-10-CM | POA: Diagnosis not present

## 2017-04-13 DIAGNOSIS — M1A9XX0 Chronic gout, unspecified, without tophus (tophi): Secondary | ICD-10-CM

## 2017-04-13 DIAGNOSIS — J111 Influenza due to unidentified influenza virus with other respiratory manifestations: Secondary | ICD-10-CM | POA: Diagnosis not present

## 2017-04-13 LAB — INFLUENZA PANEL BY PCR (TYPE A & B)
Influenza A By PCR: NEGATIVE
Influenza B By PCR: NEGATIVE

## 2017-04-13 NOTE — Progress Notes (Signed)
Location:   [place] Room Number: 139/D Place of Service:  SNF (31) Provider:  Veleta Miners  Patient, [name]  Patient Care Team: Patient, [name] as PCP - General (General Practice) Danie Binder, MD as Consulting Physician (Gastroenterology) [name], MD as Consulting Physician [facility])  Extended Emergency Contact Information Primary Emergency Contact: Black,Shirley Address: [address]          [place], [place] [address] [name] of [place] Phone: [phone] Mobile Phone: [phone] Relation: Daughter Secondary Emergency Contact: [facility] Phone: [phone] Relation: Niece  Code Status:  DNR Goals of care: Advanced Directive information Advanced Directives 04/13/2017  Does Patient Have a Medical Advance Directive? Yes  Type of Advance Directive Out of facility DNR (pink MOST or yellow form)  Does patient want to make changes to medical advance directive? No - Patient declined  Copy of [place] in Chart? No - copy requested  Pre-existing out of facility DNR order (yellow form or pink MOST form) -     Chief Complaint  Patient presents with  . Acute Visit    [place] c/o Gout    HPI:  Pt is a 84 y.o. male seen today for an acute visit for Follow up of Possible gout for Pain in his Right Great toe.   Patient Is Long term resident of facility and has h/o HTN, CHF, Depression, Arthritis, Anemia, GERD, Chronic Foley Cathter due to Urinary retention.PVD ABI in 2015 refused Aggressive approach. Aspiration Pneumonia and Dysphagia  There was some concern that patient probably has Gout in his Right Great toe. He has had chronic Pain in that toe with ulcer. But now he just has pian. He was started by Uloric but his Insurance has denied to cover it.  Nurse also wanted me to see him today as he was mildly Tachycardic and his BP was little low. He had Flu swab done but that was negative. Patient  says he feels fine . He is eating well. Did have some cough.  Past Medical History:  Diagnosis Date  . Anxiety   . Benign prostatic hyperplasia   . Bilateral foot pain   . CHF (congestive heart failure) ([place])   . Depression   . Diverticulitis   . DJD (degenerative joint disease), cervical   . DNR (do not resuscitate)   . Esophageal dysmotility    age related per BPE 04/2015  . Esophageal stricture   . Falling   . GERD (gastroesophageal reflux disease)   . Gout   . Hiatal hernia   . History of recurrent TIAs   . HTN (hypertension)   . Hyperlipidemia   . Inguinal hernia   . Ischemic heart disease   . Kidney stone   . Osteoarthritis    bilat knees  . Pleural effusion   . Reflux   . Restless leg syndrome   . Right hip pain   . Skin cancer, basal cell   . Urinary retention    Past Surgical History:  Procedure Laterality Date  . ESOPHAGEAL DILATION    . EXPLORATORY LAPAROTOMY W/ BOWEL RESECTION    . FRACTURE SURGERY    . HEMIARTHROPLASTY HIP     Dr. Aline Brochure  . intestines      Allergies  Allergen Reactions  . Celebrex [Celecoxib] Other (See Comments)    Unknown; patient can't remember  . Codeine Other (See Comments)    nausea    Outpatient Encounter Medications  as of 04/13/2017  Medication Sig  . aspirin EC 81 MG tablet Take 81 mg by mouth daily.  . Cholecalciferol (VITAMIN D) 2000 UNITS CAPS Take 1 capsule by mouth daily.  Marland Kitchen docusate sodium (COLACE) 100 MG capsule Take 1 capsule (100 mg total) by mouth 2 (two) times daily. For constipation.  Marland Kitchen guaiFENesin (MUCINEX) 600 MG 12 hr tablet Take 600 mg 2 (two) times daily as needed by mouth for cough or to loosen phlegm.   Marland Kitchen HYDROcodone-acetaminophen (NORCO/VICODIN) 5-325 MG tablet Take 1 tablet by mouth every evening.  Marland Kitchen HYDROcodone-acetaminophen (NORCO/VICODIN) 5-325 MG tablet Give 1 tablet by mouth once a day for pain. Take 1 tablet by mouth every 6 hours prn for  pain  . ipratropium-albuterol (DUONEB) 0.5-2.5 (3)  MG/3ML SOLN Take 3 mLs every 6 (six) hours as needed by nebulization (for shortness of breath).  . magnesium hydroxide (MILK OF MAGNESIA) 400 MG/5ML suspension Take 30 mLs by mouth daily as needed for mild constipation. If no relief try a Fleets enema, after 2 days.  . Menthol, Topical Analgesic, (BIOFREEZE) 4 % GEL Apply small to back as needed for pain  . omeprazole (PRILOSEC) 20 MG capsule Take 20 mg by mouth daily.  Marland Kitchen Propylene Glycol 0.6 % SOLN Apply 1 drop to both eyes twice a day  . rOPINIRole (REQUIP) 2 MG tablet Take 2 mg by mouth at bedtime.  . sertraline (ZOLOFT) 25 MG tablet Take 37.5 mg by mouth daily.    No facility-administered encounter medications on file as of 04/13/2017.      Review of Systems  Constitutional: Negative.   HENT: Negative.   Respiratory: Positive for cough.   Cardiovascular: Negative.   Gastrointestinal: Negative.   Genitourinary: Negative.   Musculoskeletal: Positive for joint swelling.  Neurological: Negative.   Psychiatric/Behavioral: Negative.     Immunization History  Administered Date(s) Administered  . Influenza,inj,Quad PF,6+ Mos 12/07/2014  . Influenza-Unspecified 11/20/2013, 11/13/2015, 11/18/2016  . PPD Test 03/16/2013  . Pneumococcal-Unspecified 02/14/2009, 11/25/2015  . Tdap 11/10/2016   Pertinent  Health Maintenance Due  Topic Date Due  . PNA vac Low Risk Adult (2 of 2 - PCV13) 05/09/2017 (Originally 11/24/2016)  . INFLUENZA VACCINE  Completed   Fall Risk  11/10/2016  Falls in the past year? No   Functional Status Survey:    Vitals:   04/13/17 1950  BP: (!) 110/50  Pulse: (!) 105  Resp: 19  Temp: 99.2 F (37.3 C)   There is no height or weight on file to calculate BMI. Physical Exam  Constitutional: He is oriented to person, place, and time. He appears well-developed and well-nourished.  HENT:  Head: Normocephalic.  Mouth/Throat: Oropharynx is clear and moist.  Eyes: Pupils are equal, round, and reactive to light.    Neck: Neck supple.  Cardiovascular: Normal rate and normal heart sounds.  No murmur heard. Pulmonary/Chest: Effort normal and breath sounds normal. No respiratory distress. He has no wheezes. He has no rales.  Abdominal: Soft. Bowel sounds are normal. He exhibits no distension. There is no tenderness. There is no rebound.  Musculoskeletal: He exhibits no edema.  Patient has some redness and pain in his right Great toe.and toe next to it  Lymphadenopathy:    He has no cervical adenopathy.  Neurological: He is alert and oriented to person, place, and time.  Skin: Skin is warm and dry.  Psychiatric: He has a normal mood and affect. His behavior is normal.    Labs reviewed: Recent  Labs    12/27/2016 0446 01/06/17 0730 01/09/17 0400  NA 132* 132* 133*  K 3.9 3.7 4.5  CL 102 100* 99*  CO2 23 23 27   GLUCOSE 92 112* 94  BUN 16 16 13   CREATININE 1.12 1.36* 1.01  CALCIUM 8.4* 8.3* 8.5*   Recent Labs    04/13/16 2220 04/14/16 0544 01/02/17 1613  AST 23 17 26   ALT 19 16* 14*  ALKPHOS 80 75 87  BILITOT 0.6 0.7 0.8  PROT 7.0 6.8 7.4  ALBUMIN 2.8* 2.6* 3.5   Recent Labs    11/29/16 0730 01/02/17 1613 01/03/17 0314 01/01/2017 0446 01/06/17 0730  WBC 7.4 38.2* 19.0* 10.0 6.6  NEUTROABS 4.3 34.4*  --   --  3.4  HGB 11.2* 10.9* 9.5* 10.0* 10.0*  HCT 34.7* 34.9* 29.5* 32.2* 32.5*  MCV 87.0 87.9 87.3 87.3 87.8  PLT 247 199 160 162 185   Lab Results  Component Value Date   TSH 1.323 04/13/2016   Lab Results  Component Value Date   HGBA1C 6.1 (H) 11/12/2014   No results found for: CHOL, HDL, LDLCALC, LDLDIRECT, TRIG, CHOLHDL  Significant Diagnostic Results in last 30 days:  No results found.  Assessment/Plan  Low Grade fever with Tachycardia Flu test ordered It  was negative Will get CBC and BMP Also Vitals more frequently Patient asymptomatic right now. Possible gout  Uric acid level is normal Will xray that foot. Will start him on Allopurinol for now  Family/  staff Communication:   Labs/tests ordered:   Total time spent in this patient care encounter was 25_ minutes; greater than 50% of the visit spent counseling patient, reviewing records , Labs and coordinating care for problems addressed at this encounter.

## 2017-04-26 ENCOUNTER — Other Ambulatory Visit: Payer: Self-pay

## 2017-04-26 MED ORDER — HYDROCODONE-ACETAMINOPHEN 5-325 MG PO TABS: ORAL_TABLET | ORAL | 0 refills | Status: DC

## 2017-04-26 NOTE — Telephone Encounter (Signed)
RX Fax for [facility]@ [phone]  

## 2017-04-27 ENCOUNTER — Encounter: Payer: Self-pay | Admitting: Internal Medicine

## 2017-04-27 ENCOUNTER — Non-Acute Institutional Stay (SKILLED_NURSING_FACILITY): Payer: Medicare Other | Admitting: Internal Medicine

## 2017-04-27 DIAGNOSIS — K219 Gastro-esophageal reflux disease without esophagitis: Secondary | ICD-10-CM

## 2017-04-27 DIAGNOSIS — Z8739 Personal history of other diseases of the musculoskeletal system and connective tissue: Secondary | ICD-10-CM

## 2017-04-27 DIAGNOSIS — R000 Tachycardia, unspecified: Secondary | ICD-10-CM

## 2017-04-27 NOTE — Progress Notes (Signed)
Location:   [facility] Room Number: 139/D Place of Service:  SNF (31) Provider:  Granville Lewis  Patient, [name]  Patient Care Team: Patient, [name] as PCP - General (General Practice) Danie Binder, MD as Consulting Physician (Gastroenterology) [name], MD as Consulting Physician [facility])  Extended Emergency Contact Information Primary Emergency Contact: Black,Shirley Address: [address]          [place], [place] [address] [name] of [place] Phone: [phone] Mobile Phone: [phone] Relation: Daughter Secondary Emergency Contact: [facility] Phone: [phone] Relation: Niece  Code Status:  DNR Goals of care: Advanced Directive information Advanced Directives 04/27/2017  Does Patient Have a Medical Advance Directive? Yes  Type of Advance Directive Out of facility DNR (pink MOST or yellow form)  Does patient want to make changes to medical advance directive? No - Patient declined  Copy of [place] in Chart? No - copy requested  Pre-existing out of facility DNR order (yellow form or pink MOST form) -     Chief complaint-acute visit follow-up possible GERD-follow-up low-grade fever tachycardia- follow-up suspected gout  HPI:  Pt is a 84 y.o. male seen today for an acute visit for for follow-up of multiple issues including possible GERD-also recent low-grade fever with tachycardia- as well as recent suspected gout flare.  Patient is a long-term resident has a history of hypertension as well as CHF and depression-arthritis-anemia-GERD-chronic Foley catheter because of urinary retention-as well as peripheral vascular disease he does not want aggressive intervention-also has a history of dysphasia and aspiration pneumonia.  Was noted yesterday at times he has some coughing when he tries to eat- according to nursing this is not really new-he is on Prilosec at night.  It looks like he does have some  history of esophageal dysmotility and dysphasia-today he appears to be doing well says he is swallowing fine-I did watch him while he was eating his lunch today and appeared to have no problem-there is some suspicion he eats a bit too fast at times which causes some coughing.  He was also seen recently by Dr. Lyndel Safe for suspected gout in his right great toe-he has a history of chronic pain and ulcer with a total-but apparently his only complaint was pain in the ulcer appears to have resolved.  We did try Uloric but insurance denied to cover.  He has since been started on allopurinol and he does not have any complaints today--  Also when he was seen late last month he was noted to be mildly tachycardic with low blood pressure- although he said he was feeling fine.  He did have a flu test ordered which was negative his vital signs also were monitored and this appears to have stabilized  He is not tachycardic or febrile at this time I got a manual blood pressure of 118/60 a pulse of 65.--At one point he was on Lopressor but this has been discontinued secondary to hypotension concerns  Again he says he is doing well today and does not have any complaints and nursing does not report any issues.      Past Medical History:  Diagnosis Date  . Anxiety   . Benign prostatic hyperplasia   . Bilateral foot pain   . CHF (congestive heart failure) ([place])   . Depression   . Diverticulitis   . DJD (degenerative joint disease), cervical   . DNR (do not resuscitate)   . Esophageal dysmotility  age related per BPE 04/2015  . Esophageal stricture   . Falling   . GERD (gastroesophageal reflux disease)   . Gout   . Hiatal hernia   . History of recurrent TIAs   . HTN (hypertension)   . Hyperlipidemia   . Inguinal hernia   . Ischemic heart disease   . Kidney stone   . Osteoarthritis    bilat knees  . Pleural effusion   . Reflux   . Restless leg syndrome   . Right hip pain   . Skin cancer, basal  cell   . Urinary retention    Past Surgical History:  Procedure Laterality Date  . ESOPHAGEAL DILATION    . EXPLORATORY LAPAROTOMY W/ BOWEL RESECTION    . FRACTURE SURGERY    . HEMIARTHROPLASTY HIP     Dr. Aline Brochure  . intestines      Allergies  Allergen Reactions  . Celebrex [Celecoxib] Other (See Comments)    Unknown; patient can't remember  . Codeine Other (See Comments)    nausea    Outpatient Encounter Medications as of 04/27/2017  Medication Sig  . allopurinol (ZYLOPRIM) 100 MG tablet Take 100 mg by mouth daily.  Marland Kitchen aspirin EC 81 MG tablet Take 81 mg by mouth daily.  . Cholecalciferol (VITAMIN D) 2000 UNITS CAPS Take 1 capsule by mouth daily.  Marland Kitchen docusate sodium (COLACE) 100 MG capsule Take 1 capsule (100 mg total) by mouth 2 (two) times daily. For constipation.  Marland Kitchen guaiFENesin (MUCINEX) 600 MG 12 hr tablet Take 600 mg 2 (two) times daily as needed by mouth for cough or to loosen phlegm.   Marland Kitchen HYDROcodone-acetaminophen (NORCO/VICODIN) 5-325 MG tablet Take 1 tablet by mouth once daily and every 6 hours as needed for pain  . HYDROcodone-acetaminophen (NORCO/VICODIN) 5-325 MG tablet Take 1 tablet by mouth every evening.  Marland Kitchen ipratropium-albuterol (DUONEB) 0.5-2.5 (3) MG/3ML SOLN Take 3 mLs every 6 (six) hours as needed by nebulization (for shortness of breath).  . magnesium hydroxide (MILK OF MAGNESIA) 400 MG/5ML suspension Take 30 mLs by mouth daily as needed for mild constipation. If no relief try a Fleets enema, after 2 days.  . Menthol, Topical Analgesic, (BIOFREEZE) 4 % GEL Apply small to back as needed for pain  . Neomycin-Bacitracin-Polymyxin ([employer] ANTIBIOTIC) [phone] OINT Cleanse left earlobe  W/NS pat dry and apply triple antibiotic ointment daily and prn until healed once a day  . omeprazole (PRILOSEC) 20 MG capsule Take 20 mg by mouth daily.  Marland Kitchen Propylene Glycol 0.6 % SOLN Apply 1 drop to both eyes twice a day  . rOPINIRole (REQUIP) 2 MG tablet Take 2 mg by mouth at  bedtime.  . sertraline (ZOLOFT) 25 MG tablet Take 37.5 mg by mouth daily.    No facility-administered encounter medications on file as of 04/27/2017.     Review of Systems   In general is not complaining of any fever chills says he is doing well today.  Skin does not complain of any rashes or itching.  Head ears eyes nose mouth and throat does not complain of visual changes or difficulty swallowing today.  Respiratory denies any increased cough or shortness of breath.  Cardiac does not complain of chest pain has some mild pedal edema although this appears to be baseline and quite mild.  GI is not complaining of any abdominal discomfort nausea vomiting diarrhea constipation.  GU has an indwelling Foley catheter with a history of urinary retention does not complain of burning with  urination or dysuria.  Musculoskeletal is not complaining of joint pain today does not complain of toe pain.  Neurologic does not complain of dizziness headache or numbness.  And psych does not complain of being anxious or depressed continues to be smiling alert in good spirits  Immunization History  Administered Date(s) Administered  . Influenza,inj,Quad PF,6+ Mos 12/07/2014  . Influenza-Unspecified 11/20/2013, 11/13/2015, 11/18/2016  . PPD Test 03/16/2013  . Pneumococcal-Unspecified 02/14/2009, 11/25/2015  . Tdap 11/10/2016   Pertinent  Health Maintenance Due  Topic Date Due  . PNA vac Low Risk Adult (2 of 2 - PCV13) 05/09/2017 (Originally 11/24/2016)  . INFLUENZA VACCINE  Completed   Fall Risk  11/10/2016  Falls in the past year? No   Functional Status Survey:    Vitals:   04/27/17 1417  BP: 118/60  Pulse: 65  Resp: 17  He is afebrile  Physical Exam   In general this is a pleasant elderly male in no distress sitting comfortably in his wheelchair.  His skin is warm and dry he does have numerous solar-induced changes in numerous skin cancer removals which are followed by  dermatology.  Oropharynx is clear mucous membranes moist.  Chest is clear to auscultation there is no labored breathing.  Heart is regular rate and rhythm in the 60s without murmur gallop or rub he has mild pedal edema.  Abdomen is soft protuberant with a right-sided abdominal hernia which is not tender to palpation-bowel sounds are active.  GU does have an indwelling Foley catheter draining amber colored urine.  Musculoskeletal has continued general frailty but able to move all extremities at baseline I did not note any significant erythema swelling of his feet toes-not really any significant tenderness palpation of the areas  Neurologic is grossly intact his speech is clear no lateralizing findings.  Psych he is largely alert and oriented pleasant and appropriate  Labs reviewed: Recent Labs    12/22/2016 0446 01/06/17 0730 01/09/17 0400  NA 132* 132* 133*  K 3.9 3.7 4.5  CL 102 100* 99*  CO2 23 23 27   GLUCOSE 92 112* 94  BUN 16 16 13   CREATININE 1.12 1.36* 1.01  CALCIUM 8.4* 8.3* 8.5*   Recent Labs    01/02/17 1613  AST 26  ALT 14*  ALKPHOS 87  BILITOT 0.8  PROT 7.4  ALBUMIN 3.5   Recent Labs    11/29/16 0730 01/02/17 1613 01/03/17 0314 12/21/2016 0446 01/06/17 0730  WBC 7.4 38.2* 19.0* 10.0 6.6  NEUTROABS 4.3 34.4*  --   --  3.4  HGB 11.2* 10.9* 9.5* 10.0* 10.0*  HCT 34.7* 34.9* 29.5* 32.2* 32.5*  MCV 87.0 87.9 87.3 87.3 87.8  PLT 247 199 160 162 185   Lab Results  Component Value Date   TSH 1.323 04/13/2016   Lab Results  Component Value Date   HGBA1C 6.1 (H) 11/12/2014   No results found for: CHOL, HDL, LDLCALC, LDLDIRECT, TRIG, CHOLHDL  Significant Diagnostic Results in last 30 days:  No results found.  Assessment/Plan  #1-history of GERD-he is on Prilosec at night he appears to be doing well today at this point will monitor patient will need to be encouraged to eat a bit more slowly which I suspect causes at times some coughing when he eats a  bit too rapidly-appears to be doing well with this meal today and has no complaints.  2.  History of recent tachycardia low-grade temp--this appears largely resolved and asymptomatic his flu swab was negative-nursing does  not report any issues in this regard.  3.  Gout-he is on allopurinol now-he appears to be asymptomatic in regards to gout symptoms-uric acid was high normal at 6.8 will monitor for any changes but hopefully the allopurinol will help.  [id number] note greater than 25 minutes spent assessing patient- discussing his status with nursing staff- reviewing his chart  And  labs- and coordinating a plan of care for numerous diagnoses-of note greater than 50% of time spent coordinating plan of care

## 2017-05-08 DIAGNOSIS — C44229 Squamous cell carcinoma of skin of left ear and external auricular canal: Secondary | ICD-10-CM | POA: Diagnosis not present

## 2017-05-15 ENCOUNTER — Non-Acute Institutional Stay (SKILLED_NURSING_FACILITY): Payer: Medicare Other | Admitting: Internal Medicine

## 2017-05-15 ENCOUNTER — Encounter: Payer: Self-pay | Admitting: Internal Medicine

## 2017-05-15 DIAGNOSIS — R000 Tachycardia, unspecified: Secondary | ICD-10-CM | POA: Diagnosis not present

## 2017-05-15 DIAGNOSIS — L0390 Cellulitis, unspecified: Secondary | ICD-10-CM | POA: Diagnosis not present

## 2017-05-15 DIAGNOSIS — I959 Hypotension, unspecified: Secondary | ICD-10-CM | POA: Diagnosis not present

## 2017-05-15 DIAGNOSIS — N289 Disorder of kidney and ureter, unspecified: Secondary | ICD-10-CM | POA: Diagnosis not present

## 2017-05-15 DIAGNOSIS — D638 Anemia in other chronic diseases classified elsewhere: Secondary | ICD-10-CM

## 2017-05-15 NOTE — Progress Notes (Signed)
Location:   [facility] Room Number: 139/D Place of Service:  SNF (31) Provider:  Granville Lewis  Patient, [name]  Patient Care Team: Patient, [name] as PCP - General (General Practice) Danie Binder, MD as Consulting Physician (Gastroenterology) [name], MD as Consulting Physician [facility])  Extended Emergency Contact Information Primary Emergency Contact: Black,Shirley Address: [address]          [place], [place] [address] [name] of [place] Phone: [phone] Mobile Phone: [phone] Relation: Daughter Secondary Emergency Contact: [facility] Phone: [phone] Relation: Niece  Code Status:  DNR Goals of care: Advanced Directive information Advanced Directives 05/15/2017  Does Patient Have a Medical Advance Directive? Yes  Type of Advance Directive Out of facility DNR (pink MOST or yellow form)  Does patient want to make changes to medical advance directive? No - Patient declined  Copy of [place] in Chart? No - copy requested  Pre-existing out of facility DNR order (yellow form or pink MOST form) -     Chief complaint-acute visit follow-up of possible left ear infection  HPI:  Pt is a 84 y.o. male seen today for an acute visit for concerns of possible infection left ears surgical site.   Patient is a long-term resident of facility with a history of hypertension and CHF and depression as well as arthritis anemia and GERD chronic Foley catheter because of urinary retention-he also has peripheral vascular disease but does not want aggressive interventions-also history of dysphasia and aspiration pneumonia.  He also has a history of numerous skin cancer removals and is followed by dermatology.  Patient recently had a skin cancer removed from his left helix- this was back in February and he was also treated with a short course of Cipro.  Recently he had an additional ear tissue removed by  dermatology-today nursing staff has noted some increased tenderness and possible erythema at the site- as well as some scabbing.  Patient is complaining of some tenderness does not complain of any increased difficulty hearing from baseline.  Not complaining of fever chills says he actually feels pretty good.  He was recently seen for tachycardia and some low blood pressure but this appears to be intermittent blood pressure manually today was 108/58 pulse was in the 60s.  He continues to deny any dizziness syncope or feeling like he has palpitations  Past Medical History:  Diagnosis Date  . Anxiety   . Benign prostatic hyperplasia   . Bilateral foot pain   . CHF (congestive heart failure) ([place])   . Depression   . Diverticulitis   . DJD (degenerative joint disease), cervical   . DNR (do not resuscitate)   . Esophageal dysmotility    age related per BPE 04/2015  . Esophageal stricture   . Falling   . GERD (gastroesophageal reflux disease)   . Gout   . Hiatal hernia   . History of recurrent TIAs   . HTN (hypertension)   . Hyperlipidemia   . Inguinal hernia   . Ischemic heart disease   . Kidney stone   . Osteoarthritis    bilat knees  . Pleural effusion   . Reflux   . Restless leg syndrome   . Right hip pain   . Skin cancer, basal cell   . Urinary retention    Past Surgical History:  Procedure Laterality Date  . ESOPHAGEAL DILATION    . EXPLORATORY LAPAROTOMY W/ BOWEL RESECTION    .  FRACTURE SURGERY    . HEMIARTHROPLASTY HIP     Dr. Aline Brochure  . intestines      Allergies  Allergen Reactions  . Celebrex [Celecoxib] Other (See Comments)    Unknown; patient can't remember  . Codeine Other (See Comments)    nausea    Outpatient Encounter Medications as of 05/15/2017  Medication Sig  . allopurinol (ZYLOPRIM) 100 MG tablet Take 100 mg by mouth daily.  Marland Kitchen aspirin EC 81 MG tablet Take 81 mg by mouth daily.  . Cholecalciferol (VITAMIN D) 2000 UNITS CAPS Take 1 capsule by  mouth daily.  Marland Kitchen docusate sodium (COLACE) 100 MG capsule Take 1 capsule (100 mg total) by mouth 2 (two) times daily. For constipation.  Marland Kitchen guaiFENesin (MUCINEX) 600 MG 12 hr tablet Take 600 mg 2 (two) times daily as needed by mouth for cough or to loosen phlegm.   Marland Kitchen HYDROcodone-acetaminophen (NORCO/VICODIN) 5-325 MG tablet Take 1 tablet by mouth once daily and every 6 hours as needed for pain  . HYDROcodone-acetaminophen (NORCO/VICODIN) 5-325 MG tablet Take 1 tablet by mouth every evening.  Marland Kitchen ipratropium-albuterol (DUONEB) 0.5-2.5 (3) MG/3ML SOLN Take 3 mLs every 6 (six) hours as needed by nebulization (for shortness of breath).  . magnesium hydroxide (MILK OF MAGNESIA) 400 MG/5ML suspension Take 30 mLs by mouth daily as needed for mild constipation. If no relief try a Fleets enema, after 2 days.  . Menthol, Topical Analgesic, (BIOFREEZE) 4 % GEL Apply small to back as needed for pain  . Neomycin-Bacitracin-Polymyxin ([employer]S ANTIBIOTIC) [phone] OINT Cleanse left earlobe  W/NS pat dry and apply triple antibiotic ointment daily and prn until healed once a day  . omeprazole (PRILOSEC) 20 MG capsule Take 20 mg by mouth daily.  Marland Kitchen Propylene Glycol 0.6 % SOLN Apply 1 drop to both eyes twice a day  . rOPINIRole (REQUIP) 2 MG tablet Take 2 mg by mouth at bedtime.  . sertraline (ZOLOFT) 25 MG tablet Take 37.5 mg by mouth daily.    No facility-administered encounter medications on file as of 05/15/2017.     Review of Systems    In general is not complaining of any fever chills.  Weight appears to be relatively stable he is lost a couple pounds in the past month.  Skin as noted above does have issues with his left ear numerous skin cancer removals he is followed by dermatology does not complain of rashes or itching.  Head ears eyes nose mouth and throat is not complain of visual changes or sore throat ear changes as noted above.  Cardiac is not complaining of chest pain has some mild pedal edema but  this has improved per comparison with previous exams.  GI is not complaining of abdominal discomfort nausea vomiting diarrhea constipation does have a large right-sided hernia.  GU has an indwelling Foley catheter but does not complain of dysuria does have urinary retention.  Musculoskeletal is not complaining of joint pain currently.  Neurologic is not complaining of dizziness headache or syncope.  Psych does not complain of being anxious or depressed-he is on low-dose low-dose Zoloft  Immunization History  Administered Date(s) Administered  . Influenza,inj,Quad PF,6+ Mos 12/07/2014  . Influenza-Unspecified 11/20/2013, 11/13/2015, 11/18/2016  . PPD Test 03/16/2013  . Pneumococcal-Unspecified 02/14/2009, 11/25/2015  . Tdap 11/10/2016   Pertinent  Health Maintenance Due  Topic Date Due  . PNA vac Low Risk Adult (2 of 2 - PCV13) 06/14/2017 (Originally 11/24/2016)  . INFLUENZA VACCINE  09/14/2017   Fall  Risk  11/10/2016  Falls in the past year? No   Functional Status Survey:    Vitals:   05/15/17 1618  BP: (!) 94/53  Pulse: 66  Resp: 20  Temp: (!) 97 F (36.1 C)  TempSrc: Oral  SpO2: 97%  Of note manual blood pressure was 108/58--- the 94/53 was a machine reading Physical Exam  In general this is a pleasant elderly male in no distress sitting comfortably in his wheelchair.  His skin is warm and dry does have numerous solar-induced changes with scaling-left helix-there has been some removal of the helix secondary to biopsies- there is crusting with some dried blood apparent and appears to have some tenderness to this area with some mild erythema and warmth-I did not see any active drainage or bleeding.  Oropharynx is clear mucous membranes moist.  Chest is clear to auscultation there is no labored breathing.  Heart continues to be regular rate and rhythm without murmur gallop or rub his edema is quite minimal and appears to have improved compared to several months  ago.  Abdomen is soft nontender with active bowel sounds he does have a large right-sided hernia.  Musculoskeletal has frailty but is able to move all extremities at baseline he does ambulate fairly well in a wheelchair.  Neurologic-is grossly intact cranial nerves intact his speech is clear could not really appreciate lateralizing findings.  Psych he is alert and oriented pleasant appropriate   Labs reviewed: Recent Labs    12/25/2016 0446 01/06/17 0730 01/09/17 0400  NA 132* 132* 133*  K 3.9 3.7 4.5  CL 102 100* 99*  CO2 23 23 27   GLUCOSE 92 112* 94  BUN 16 16 13   CREATININE 1.12 1.36* 1.01  CALCIUM 8.4* 8.3* 8.5*   Recent Labs    01/02/17 1613  AST 26  ALT 14*  ALKPHOS 87  BILITOT 0.8  PROT 7.4  ALBUMIN 3.5   Recent Labs    11/29/16 0730 01/02/17 1613 01/03/17 0314 12/29/2016 0446 01/06/17 0730  WBC 7.4 38.2* 19.0* 10.0 6.6  NEUTROABS 4.3 34.4*  --   --  3.4  HGB 11.2* 10.9* 9.5* 10.0* 10.0*  HCT 34.7* 34.9* 29.5* 32.2* 32.5*  MCV 87.0 87.9 87.3 87.3 87.8  PLT 247 199 160 162 185   Lab Results  Component Value Date   TSH 1.323 04/13/2016   Lab Results  Component Value Date   HGBA1C 6.1 (H) 11/12/2014   No results found for: CHOL, HDL, LDLCALC, LDLDIRECT, TRIG, CHOLHDL  Significant Diagnostic Results in last 30 days:  No results found.  Assessment/Plan  #1 possible left ear infection---cellulitis --we will restart his Cipro 500 mg daily for 10 days- also will need dermatology follow-up-and also will add a probiotic twice daily for 10 days- I consider doxycycline but somewhat hesitant secondary to a previous history of dysphasia when he had a Cipro-doxycycline combination-he has been on Cipro before prescribed by dermatology and tolerated this well   #2- tachycardia-I do not see evidence of that today at this point will monitor.  3.  Hyportension- appears occasionally he will have some systolic readings in the 37S- but this does not appear to be  consistent and he is asymptomatic he is no longer on any blood pressure medicine-manual reading was reassuring at 108/58--continue to monitor.  4.  Anemia I suspect there is an element of chronic disease with his advanced age hemoglobin showed stability back in November 2018 with a hemoglobin of 10.0-will update this for updated  values.  5.  History of renal insufficiency this actually has been quite stable with a creatinine 1.01 on lab done back in November will update this as well for updated values  [id number]

## 2017-05-16 ENCOUNTER — Encounter (HOSPITAL_COMMUNITY)
Admission: RE | Admit: 2017-05-16 | Discharge: 2017-05-16 | Disposition: A | Discharge status: Home / Self Care | Payer: Medicare Other | Source: Skilled Nursing Facility | Attending: Internal Medicine | Admitting: Internal Medicine

## 2017-05-16 DIAGNOSIS — M1000 Idiopathic gout, unspecified site: Secondary | ICD-10-CM | POA: Insufficient documentation

## 2017-05-16 LAB — COMPREHENSIVE METABOLIC PANEL
ALK PHOS: 102 U/L (ref 38–126)
ALT: 10 U/L — AB (ref 17–63)
AST: 17 U/L (ref 15–41)
Albumin: 3.2 g/dL — ABNORMAL LOW (ref 3.5–5.0)
Anion gap: 8 (ref 5–15)
BILIRUBIN TOTAL: 0.4 mg/dL (ref 0.3–1.2)
BUN: 16 mg/dL (ref 6–20)
CO2: 26 mmol/L (ref 22–32)
CREATININE: 1.15 mg/dL (ref 0.61–1.24)
Calcium: 8.6 mg/dL — ABNORMAL LOW (ref 8.9–10.3)
Chloride: 100 mmol/L — ABNORMAL LOW (ref 101–111)
GFR calc Af Amer: 58 mL/min — ABNORMAL LOW (ref >60)
GFR, EST NON AFRICAN AMERICAN: 50 mL/min — AB (ref >60)
Glucose, Bld: 109 mg/dL — ABNORMAL HIGH (ref 65–99)
Potassium: 4.5 mmol/L (ref 3.5–5.1)
Sodium: 134 mmol/L — ABNORMAL LOW (ref 135–145)
TOTAL PROTEIN: 7.1 g/dL (ref 6.5–8.1)

## 2017-05-16 LAB — CBC WITH DIFFERENTIAL/PLATELET
BASOS ABS: 0 10*3/uL (ref 0.0–0.1)
Basophils Relative: 1 %
Eosinophils Absolute: 0.7 10*3/uL (ref 0.0–0.7)
Eosinophils Relative: 8 %
HCT: 32.5 % — ABNORMAL LOW (ref 39.0–52.0)
Hemoglobin: 10.1 g/dL — ABNORMAL LOW (ref 13.0–17.0)
LYMPHS PCT: 18 %
Lymphs Abs: 1.5 10*3/uL (ref 0.7–4.0)
MCH: 26.4 pg (ref 26.0–34.0)
MCHC: 31.1 g/dL (ref 30.0–36.0)
MCV: 85.1 fL (ref 78.0–100.0)
Monocytes Absolute: 1.2 10*3/uL — ABNORMAL HIGH (ref 0.1–1.0)
Monocytes Relative: 14 %
NEUTROS ABS: 5.1 10*3/uL (ref 1.7–7.7)
Neutrophils Relative %: 59 %
Platelets: 186 10*3/uL (ref 150–400)
RBC: 3.82 MIL/uL — AB (ref 4.22–5.81)
RDW: 16.5 % — ABNORMAL HIGH (ref 11.5–15.5)
WBC: 8.6 10*3/uL (ref 4.0–10.5)

## 2017-05-17 ENCOUNTER — Encounter (HOSPITAL_COMMUNITY)
Admission: RE | Admit: 2017-05-17 | Discharge: 2017-05-17 | Disposition: A | Discharge status: Home / Self Care | Payer: Medicare Other | Source: Skilled Nursing Facility | Attending: Internal Medicine | Admitting: Internal Medicine

## 2017-05-17 DIAGNOSIS — R339 Retention of urine, unspecified: Secondary | ICD-10-CM | POA: Insufficient documentation

## 2017-05-17 DIAGNOSIS — M1000 Idiopathic gout, unspecified site: Secondary | ICD-10-CM | POA: Insufficient documentation

## 2017-05-17 DIAGNOSIS — K4090 Unilateral inguinal hernia, without obstruction or gangrene, not specified as recurrent: Secondary | ICD-10-CM | POA: Insufficient documentation

## 2017-05-17 DIAGNOSIS — I259 Chronic ischemic heart disease, unspecified: Secondary | ICD-10-CM | POA: Insufficient documentation

## 2017-05-17 DIAGNOSIS — D6489 Other specified anemias: Secondary | ICD-10-CM | POA: Insufficient documentation

## 2017-05-17 DIAGNOSIS — I10 Essential (primary) hypertension: Secondary | ICD-10-CM | POA: Insufficient documentation

## 2017-05-18 DIAGNOSIS — B351 Tinea unguium: Secondary | ICD-10-CM | POA: Diagnosis not present

## 2017-05-18 DIAGNOSIS — R262 Difficulty in walking, not elsewhere classified: Secondary | ICD-10-CM | POA: Diagnosis not present

## 2017-05-18 DIAGNOSIS — I739 Peripheral vascular disease, unspecified: Secondary | ICD-10-CM | POA: Diagnosis not present

## 2017-06-08 ENCOUNTER — Non-Acute Institutional Stay (SKILLED_NURSING_FACILITY): Payer: Medicare Other | Admitting: Internal Medicine

## 2017-06-08 ENCOUNTER — Encounter: Payer: Self-pay | Admitting: Internal Medicine

## 2017-06-08 DIAGNOSIS — I4891 Unspecified atrial fibrillation: Secondary | ICD-10-CM

## 2017-06-08 DIAGNOSIS — M1612 Unilateral primary osteoarthritis, left hip: Secondary | ICD-10-CM

## 2017-06-08 DIAGNOSIS — F329 Major depressive disorder, single episode, unspecified: Secondary | ICD-10-CM | POA: Diagnosis not present

## 2017-06-08 DIAGNOSIS — N401 Enlarged prostate with lower urinary tract symptoms: Secondary | ICD-10-CM

## 2017-06-08 DIAGNOSIS — Z8739 Personal history of other diseases of the musculoskeletal system and connective tissue: Secondary | ICD-10-CM

## 2017-06-08 DIAGNOSIS — F32A Depression, unspecified: Secondary | ICD-10-CM

## 2017-06-08 DIAGNOSIS — R1310 Dysphagia, unspecified: Secondary | ICD-10-CM

## 2017-06-08 DIAGNOSIS — D509 Iron deficiency anemia, unspecified: Secondary | ICD-10-CM | POA: Diagnosis not present

## 2017-06-08 DIAGNOSIS — K219 Gastro-esophageal reflux disease without esophagitis: Secondary | ICD-10-CM | POA: Diagnosis not present

## 2017-06-08 DIAGNOSIS — G2581 Restless legs syndrome: Secondary | ICD-10-CM

## 2017-06-08 DIAGNOSIS — Z85828 Personal history of other malignant neoplasm of skin: Secondary | ICD-10-CM | POA: Diagnosis not present

## 2017-06-08 DIAGNOSIS — I739 Peripheral vascular disease, unspecified: Secondary | ICD-10-CM | POA: Diagnosis not present

## 2017-06-08 DIAGNOSIS — Z08 Encounter for follow-up examination after completed treatment for malignant neoplasm: Secondary | ICD-10-CM | POA: Diagnosis not present

## 2017-06-08 NOTE — Progress Notes (Signed)
Location:   [name] Room Number: 139/D Place of Service:  SNF (31) Provider:  Veleta Miners  Patient, [name]  Patient Care Team: Patient, [name] as PCP - General (General Practice) Danie Binder, MD as Consulting Physician (Gastroenterology) [name], MD as Consulting Physician [facility])  Extended Emergency Contact Information Primary Emergency Contact: Black,Shirley Address: [address]          [place], [place] [address] [name] of [place] Phone: [phone] Mobile Phone: [phone] Relation: Daughter Secondary Emergency Contact: [facility] Phone: [phone] Relation: Niece  Code Status:  DNR Goals of care: Advanced Directive information Advanced Directives 06/08/2017  Does Patient Have a Medical Advance Directive? Yes  Type of Advance Directive Out of facility DNR (pink MOST or yellow form)  Does patient want to make changes to medical advance directive? No - Patient declined  Copy of [place] in Chart? No - copy requested  Pre-existing out of facility DNR order (yellow form or pink MOST form) -     Chief Complaint  Patient presents with  . Medical Management of Chronic Issues    [place] Visit    HPI:  Pt is a 84 y.o. male seen today for medical management of chronic diseases.    Patient Is Long term resident of facility and has h/o HTN, CHF, Depression, Arthritis, Anemia, GERD, Chronic Foley Cathter due to Urinary retention.PVD ABI in 2015 refused Aggressive approach, Aspiration Pneumonia, Dysphagia, Gout, paroxysmal Atrial Fibrillation not on any anticoagulation  Patient is doing well in facility. His weight is stable at 167 -169 lbs. He has good appetite and Nurses have no new issues. He did see Dr Nevada Crane for his ear Lesion which has now healed well. Since being on Allopurinol patient has not had any Acute gout in his toe.  Past Medical History:  Diagnosis Date  . Anxiety   .  Benign prostatic hyperplasia   . Bilateral foot pain   . CHF (congestive heart failure) ([place])   . Depression   . Diverticulitis   . DJD (degenerative joint disease), cervical   . DNR (do not resuscitate)   . Esophageal dysmotility    age related per BPE 04/2015  . Esophageal stricture   . Falling   . GERD (gastroesophageal reflux disease)   . Gout   . Hiatal hernia   . History of recurrent TIAs   . HTN (hypertension)   . Hyperlipidemia   . Inguinal hernia   . Ischemic heart disease   . Kidney stone   . Osteoarthritis    bilat knees  . Pleural effusion   . Reflux   . Restless leg syndrome   . Right hip pain   . Skin cancer, basal cell   . Urinary retention    Past Surgical History:  Procedure Laterality Date  . ESOPHAGEAL DILATION    . EXPLORATORY LAPAROTOMY W/ BOWEL RESECTION    . FRACTURE SURGERY    . HEMIARTHROPLASTY HIP     Dr. Aline Brochure  . intestines      Allergies  Allergen Reactions  . Celebrex [Celecoxib] Other (See Comments)    Unknown; patient can't remember  . Codeine Other (See Comments)    nausea    Outpatient Encounter Medications as of 06/08/2017  Medication Sig  . allopurinol (ZYLOPRIM) 100 MG tablet Take 100 mg by mouth daily.  Marland Kitchen aspirin EC 81 MG tablet Take 81 mg by mouth daily.  . Cholecalciferol (  VITAMIN D) 2000 UNITS CAPS Take 1 capsule by mouth daily.  Marland Kitchen docusate sodium (COLACE) 100 MG capsule Take 1 capsule (100 mg total) by mouth 2 (two) times daily. For constipation.  Marland Kitchen guaiFENesin (MUCINEX) 600 MG 12 hr tablet Take 600 mg 2 (two) times daily as needed by mouth for cough or to loosen phlegm.   Marland Kitchen HYDROcodone-acetaminophen (NORCO/VICODIN) 5-325 MG tablet Take 1 tablet by mouth once daily and every 6 hours as needed for pain  . HYDROcodone-acetaminophen (NORCO/VICODIN) 5-325 MG tablet Take 1 tablet by mouth every evening.  Marland Kitchen HYDROcodone-acetaminophen (NORCO/VICODIN) 5-325 MG tablet Take 1 tablet by mouth daily.  Marland Kitchen ipratropium-albuterol  (DUONEB) 0.5-2.5 (3) MG/3ML SOLN Take 3 mLs every 6 (six) hours as needed by nebulization (for shortness of breath).  . magnesium hydroxide (MILK OF MAGNESIA) 400 MG/5ML suspension Take 30 mLs by mouth daily as needed for mild constipation. If no relief try a Fleets enema, after 2 days.  . Menthol, Topical Analgesic, (BIOFREEZE) 4 % GEL Apply small to back as needed for pain  . Neomycin-Bacitracin-Polymyxin ([employer] ANTIBIOTIC) [phone] OINT Cleanse left earlobe  W/NS pat dry and apply triple antibiotic ointment daily and prn until healed once a day  . omeprazole (PRILOSEC) 20 MG capsule Take 20 mg by mouth daily.  Marland Kitchen Propylene Glycol 0.6 % SOLN Apply 1 drop to both eyes twice a day  . rOPINIRole (REQUIP) 2 MG tablet Take 2 mg by mouth at bedtime.  . sertraline (ZOLOFT) 25 MG tablet Take 37.5 mg by mouth daily.    No facility-administered encounter medications on file as of 06/08/2017.      Review of Systems  Review of Systems  Constitutional: Negative for activity change, appetite change, chills, diaphoresis, fatigue and fever.  HENT: Negative for mouth sores, postnasal drip, rhinorrhea, sinus pain and sore throat.   Respiratory: Negative for apnea, cough, chest tightness, shortness of breath and wheezing.   Cardiovascular: Negative for chest pain, palpitations and leg swelling.  Gastrointestinal: Negative for abdominal distention, abdominal pain, constipation, diarrhea, nausea and vomiting.  Genitourinary: Negative for dysuria and frequency.  Musculoskeletal: Negative for arthralgias, joint swelling and myalgias.  Skin: Negative for rash.  Neurological: Negative for dizziness, syncope, weakness, light-headedness and numbness.  Psychiatric/Behavioral: Negative for behavioral problems, confusion and sleep disturbance.     Immunization History  Administered Date(s) Administered  . Influenza,inj,Quad PF,6+ Mos 12/07/2014  . Influenza-Unspecified 11/20/2013, 11/13/2015, 11/18/2016  . PPD  Test 03/16/2013  . Pneumococcal-Unspecified 02/14/2009, 11/25/2015  . Tdap 11/10/2016   Pertinent  Health Maintenance Due  Topic Date Due  . PNA vac Low Risk Adult (2 of 2 - PCV13) 06/14/2017 (Originally 11/24/2016)  . INFLUENZA VACCINE  09/14/2017   Fall Risk  11/10/2016  Falls in the past year? No   Functional Status Survey:    Vitals:   06/08/17 2144  BP: 114/61  Pulse: 69  Resp: 20  Temp: (!) 97.5 F (36.4 C)  SpO2: 98%   There is no height or weight on file to calculate BMI. Physical Exam  Constitutional: He is oriented to person, place, and time. He appears well-developed and well-nourished.  HENT:  Head: Normocephalic.  Mouth/Throat: Oropharynx is clear and moist.  Eyes: Pupils are equal, round, and reactive to light.  Neck: Neck supple.  Cardiovascular: Normal rate and regular rhythm.  No murmur heard. Pulmonary/Chest: Effort normal and breath sounds normal. No stridor. No respiratory distress. He has no wheezes.  Abdominal: Soft. Bowel sounds are normal. He exhibits  no distension. There is no tenderness. There is no guarding.  Musculoskeletal:  Trace Edema Bilateral.  Lymphadenopathy:    He has no cervical adenopathy.  Neurological: He is alert and oriented to person, place, and time.  Skin: Skin is warm and dry.  Psychiatric: He has a normal mood and affect. His behavior is normal. Thought content normal.    Labs reviewed: Recent Labs    01/06/17 0730 01/09/17 0400 05/16/17 1255  NA 132* 133* 134*  K 3.7 4.5 4.5  CL 100* 99* 100*  CO2 23 27 26   GLUCOSE 112* 94 109*  BUN 16 13 16   CREATININE 1.36* 1.01 1.15  CALCIUM 8.3* 8.5* 8.6*   Recent Labs    01/02/17 1613 05/16/17 1255  AST 26 17  ALT 14* 10*  ALKPHOS 87 102  BILITOT 0.8 0.4  PROT 7.4 7.1  ALBUMIN 3.5 3.2*   Recent Labs    01/02/17 1613  12/16/2016 0446 01/06/17 0730 05/16/17 1255  WBC 38.2*   < > 10.0 6.6 8.6  NEUTROABS 34.4*  --   --  3.4 5.1  HGB 10.9*   < > 10.0* 10.0*  10.1*  HCT 34.9*   < > 32.2* 32.5* 32.5*  MCV 87.9   < > 87.3 87.8 85.1  PLT 199   < > 162 185 186   < > = values in this interval not displayed.   Lab Results  Component Value Date   TSH 1.323 04/13/2016   Lab Results  Component Value Date   HGBA1C 6.1 (H) 11/12/2014   No results found for: CHOL, HDL, LDLCALC, LDLDIRECT, TRIG, CHOLHDL  Significant Diagnostic Results in last 30 days:  No results found.  Assessment/Plan Atrial fibrillation with RVR spontaneously resolved Patient had one episode in the hospital when he was admitted for Pneumonia. No Anticoagulation due to his age and frailty. Continue on aspirin. No more episodes in the facility. Dysphagia On modified diet PVD Patient refuses further work up. Continue on aspirin GERD On Prilosec and now doing well.  Iron deficiency anemia,  Hgb Stable No aggressive work up due to his age. Gout Has not have Toe pain since he has been on Allopurinal Depression Doing well On Zoloft  Osteoarthritis Pain controlled on Hydrocodone  Restless legs Doing well on Requip  Urinary Retention Continue Foley Cathter.  Family/ staff Communication:   Labs/tests ordered:   Total time spent in this patient care encounter was 25_ minutes; greater than 50% of the visit spent counseling patient, reviewing records , Labs and coordinating care for problems addressed at this encounter.

## 2017-06-09 ENCOUNTER — Other Ambulatory Visit: Payer: Self-pay

## 2017-06-09 MED ORDER — HYDROCODONE-ACETAMINOPHEN 5-325 MG PO TABS: ORAL_TABLET | ORAL | 0 refills | Status: DC

## 2017-06-09 NOTE — Telephone Encounter (Signed)
RX Fax for [facility]@ [phone]  

## 2017-06-29 ENCOUNTER — Non-Acute Institutional Stay (SKILLED_NURSING_FACILITY): Payer: Medicare Other | Admitting: Internal Medicine

## 2017-06-29 ENCOUNTER — Encounter: Payer: Self-pay | Admitting: Internal Medicine

## 2017-06-29 DIAGNOSIS — N401 Enlarged prostate with lower urinary tract symptoms: Secondary | ICD-10-CM | POA: Diagnosis not present

## 2017-06-29 DIAGNOSIS — I509 Heart failure, unspecified: Secondary | ICD-10-CM

## 2017-06-29 DIAGNOSIS — R1310 Dysphagia, unspecified: Secondary | ICD-10-CM | POA: Diagnosis not present

## 2017-06-29 DIAGNOSIS — J690 Pneumonitis due to inhalation of food and vomit: Secondary | ICD-10-CM | POA: Diagnosis not present

## 2017-06-29 DIAGNOSIS — M1712 Unilateral primary osteoarthritis, left knee: Secondary | ICD-10-CM | POA: Diagnosis not present

## 2017-06-29 DIAGNOSIS — I739 Peripheral vascular disease, unspecified: Secondary | ICD-10-CM

## 2017-06-29 NOTE — Progress Notes (Signed)
Location:   [facility] Room Number: 139/D Place of Service:  SNF (31) Provider: Adan Sis  Patient, [name]  Patient Care Team: Patient, [name] as PCP - General (General Practice) Danie Binder, MD as Consulting Physician (Gastroenterology) [name], MD as Consulting Physician [facility])  Extended Emergency Contact Information Primary Emergency Contact: Black,Shirley Address: [address]          [place], [place] [address] [name] of [place] Phone: [phone] Mobile Phone: [phone] Relation: Daughter Secondary Emergency Contact: [facility] Phone: [phone] Relation: Niece  Code Status:  DNR Goals of care: Advanced Directive information Advanced Directives 06/29/2017  Does Patient Have a Medical Advance Directive? Yes  Type of Advance Directive Out of facility DNR (pink MOST or yellow form)  Does patient want to make changes to medical advance directive? No - Patient declined  Copy of [place] in Chart? No - copy requested  Pre-existing out of facility DNR order (yellow form or pink MOST form) -     Chief Complaint  Patient presents with  . Medical Management of Chronic Issues    Patient being seen for Routine Visit  For medical management of chronic medical conditions including osteoarthritis- CHF-hypertension- peripheral vascular disease- history of transient A. fib- dysphagia-gout-restless legs- depression-history of aspiration pneumonia-urinary retention with chronic Foley catheter  HPI:  Pt is a 84 y.o. male seen today for medical management of chronic diseases--as noted above Despite his advanced age he continues to do fairly remarkably well  His weight appears relatively stable at around 267 pounds-he does not really have evidence of any significantly increased edema- he does have a history of CHF but is not on Lasix secondary to hypotension concerns.  He does have significant  osteoarthritis is on Norco twice daily and as needed this appears to be helping he also has topical Biofreeze if needed.  Regards to hypertension is no longer any medications because of hypotension concerns-back blood pressure today is 100/44 manually previous listed 1 she is 102/51- he does not appear to be symptomatic of any syncope or dizziness he obviously does not need further blood pressure control.  He does have a history of peripheral vascular disease and had significant foot wounds in the past but these have been largely stable now for some time.  He is on aspirin-he does not really want any aggressive intervention.  He also has a history of dysphasia is on a modified diet and has tolerated this well we also added a proton pump inhibitor which appears to be helping.  He does have a previous a significant dysphasia with a Cipro doxycycline combination so we have been avoiding this.  He did recently complete a course of simple for a left ear infection and did well with this.  Regards to urinary retention he does have a chronic indwelling Foley catheter-he is no longer on Flomax because of hypotension concerns.  Also in the past this has gout flares but since his been on allopurinol this is largely stabilized uric acid was within normal range on most recent lab back in February.  He also was hospitalized at one point for an elevated white count and aspiration pneumonia he made a fairly remarkable quick turnaround in that regard-has come back to the facility no further evidence of this.  He is again on a dysphagia diet he does have PRN Mucinex and duo nebs if needed but I do not believe these been used  much.  He also has a history of numerous skin cancer removals he has been followed closely by dermatology by Dr. Nevada Crane and this appears stabilized again he did have a left ear lesion removed and had what possibly was some cellulitis which appears resolved and this is followed again closely by  dermatology.  During his most recent hospitalization he also had a transient episode of A. fib but there is been no reoccurrence he is only on aspirin again not on a rate control agent rate appears to be controlled pulse in the 60s today- again I suspect his blood pressure would not tolerate a beta-blocker.  He also has some history of renal insufficiency which is stabilized with a creatinine of 1.15 BUN of 16 on lab done last month- he also has anemia I suspect an element of chronic disease with his advanced age but hemoglobin is stable at 10.1 on most recent lab last month.  Currently he is sitting in his wheelchair comfortably continues to be bright good spirits-- has no complaints.      .     Past Medical History:  Diagnosis Date  . Anxiety   . Benign prostatic hyperplasia   . Bilateral foot pain   . CHF (congestive heart failure) ([place])   . Depression   . Diverticulitis   . DJD (degenerative joint disease), cervical   . DNR (do not resuscitate)   . Esophageal dysmotility    age related per BPE 04/2015  . Esophageal stricture   . Falling   . GERD (gastroesophageal reflux disease)   . Gout   . Hiatal hernia   . History of recurrent TIAs   . HTN (hypertension)   . Hyperlipidemia   . Inguinal hernia   . Ischemic heart disease   . Kidney stone   . Osteoarthritis    bilat knees  . Pleural effusion   . Reflux   . Restless leg syndrome   . Right hip pain   . Skin cancer, basal cell   . Urinary retention    Past Surgical History:  Procedure Laterality Date  . ESOPHAGEAL DILATION    . EXPLORATORY LAPAROTOMY W/ BOWEL RESECTION    . FRACTURE SURGERY    . HEMIARTHROPLASTY HIP     Dr. Aline Brochure  . intestines      Allergies  Allergen Reactions  . Celebrex [Celecoxib] Other (See Comments)    Unknown; patient can't remember  . Codeine Other (See Comments)    nausea    Outpatient Encounter Medications as of 06/29/2017  Medication Sig  . acetaminophen (TYLENOL) 325 MG  tablet Take 650 mg by mouth every 4 (four) hours as needed.  Marland Kitchen allopurinol (ZYLOPRIM) 100 MG tablet Take 100 mg by mouth daily.  Marland Kitchen aspirin EC 81 MG tablet Take 81 mg by mouth daily.  . Cholecalciferol (VITAMIN D) 2000 UNITS CAPS Take 1 capsule by mouth daily.  Marland Kitchen docusate sodium (COLACE) 100 MG capsule Take 1 capsule (100 mg total) by mouth 2 (two) times daily. For constipation.  Marland Kitchen guaiFENesin (MUCINEX) 600 MG 12 hr tablet Take 600 mg 2 (two) times daily as needed by mouth for cough or to loosen phlegm.   Marland Kitchen HYDROcodone-acetaminophen (NORCO/VICODIN) 5-325 MG tablet Take 1 tablet by mouth every evening.   Marland Kitchen HYDROcodone-acetaminophen (NORCO/VICODIN) 5-325 MG tablet Take 1 tablet by mouth once daily and every 6 hours as needed for pain  . ipratropium-albuterol (DUONEB) 0.5-2.5 (3) MG/3ML SOLN Take 3 mLs every 6 (six) hours as  needed by nebulization (for shortness of breath).  . magnesium hydroxide (MILK OF MAGNESIA) 400 MG/5ML suspension Take 30 mLs by mouth daily as needed for mild constipation. If no relief try a Fleets enema, after 2 days.  . Menthol, Topical Analgesic, (BIOFREEZE) 4 % GEL Apply small to back as needed for pain  . Neomycin-Bacitracin-Polymyxin ([employer]S ANTIBIOTIC) [phone] OINT Cleanse left earlobe  W/NS pat dry and apply triple antibiotic ointment daily and prn until healed once a day  . omeprazole (PRILOSEC) 20 MG capsule Take 20 mg by mouth daily.  Marland Kitchen Propylene Glycol 0.6 % SOLN Apply 1 drop to both eyes twice a day  . rOPINIRole (REQUIP) 2 MG tablet Take 2 mg by mouth at bedtime.  . sertraline (ZOLOFT) 25 MG tablet Take 37.5 mg by mouth daily.    No facility-administered encounter medications on file as of 06/29/2017.      Review of Systems   In general is not complaining of any fever chills says he feels he is doing well weight has been stable  Does not complain of any rashes or itching is had numerous skin cancer removed by dermatology.  Head ears eyes nose mouth and  throat is not complaining of any sore throat or visual changes he has prescription lenses.  Respiratory does not complain of shortness of breath or cough at this time has some past history of aspiration pneumonia.  Cardiac is not complaining of chest pain or significant increased lower extremity edema I would say he has trace lower extremity edema which appears quite stable.  GI is not complaining of any abdominal discomfort nausea vomiting diarrhea constipation he does have a large abdominal hernia at baseline.  GU is not complaining of dysuria has an indwelling Foley catheter draining amber-colored urine  Musculoskeletal is not complaining of joint pain does have diffuse arthritic changes Norco appears to help.  Neurologic is not complaining of dizziness headache or syncope or numbness does have some history of restless legs which appears controlled on Requip.  And psych does not complain of being overtly depressed or anxious one point thought to be depressed and was started on Zoloft and has responded very well to this  Immunization History  Administered Date(s) Administered  . Influenza,inj,Quad PF,6+ Mos 12/07/2014  . Influenza-Unspecified 11/20/2013, 11/13/2015, 11/18/2016  . PPD Test 03/16/2013  . Pneumococcal-Unspecified 02/14/2009, 11/25/2015  . Tdap 11/10/2016   Pertinent  Health Maintenance Due  Topic Date Due  . PNA vac Low Risk Adult (2 of 2 - PCV13) 07/30/2017 (Originally 11/24/2016)  . INFLUENZA VACCINE  09/14/2017   Fall Risk  11/10/2016  Falls in the past year? No   Functional Status Survey:    Vitals:   06/29/17 1549  BP: (!) 102/51  Pulse: 62  Resp: 20  Temp: 97.7 F (36.5 C)  TempSrc: Oral  SpO2: 96%  Of note manual blood pressure was 100/44 Weight is relatively stable at 166.8 Physical Exam In general this is a pleasant elderly male in no distress sitting comfortably in his wheelchair.  His skin is warm and dry he has numerous solar-induced changes  with skin cancer removals but this appears stable he does have a very small Band-Aid applied over the left ear outer lobe -I do not see signs of cellulitis is nontender Nonerythematous no drainage.  Eyes he has prescription lenses sclera conjunctive are clear visual acuity appears grossly intact.  Oropharynx clear mucous membranes moist.  Chest is clear to auscultation with somewhat shallow air entry  there is no labored breathing.  Heart is regular rate and rhythm without murmur gallop or rub.  Abdomen soft nontender with positive bowel sounds and is protuberant with a large right-sided abdominal hernia which appears at baseline.  GU has an indwelling Foley catheter draining amber-colored urine.  Musculoskeletal has diffuse arthritic changes of the arms and legs but this appears stable he is able to ambulate well in a wheelchair-I did not note any sign of infection of his feet he has some small scabbed areas which do not appear to be acutely concerning.  Neurologic is grossly intact his speech is clear no lateralizing findings.  Psych he is alert and oriented pleasant appropriate appears to be in very good spirits Labs reviewed: Recent Labs    01/06/17 0730 01/09/17 0400 05/16/17 1255  NA 132* 133* 134*  K 3.7 4.5 4.5  CL 100* 99* 100*  CO2 23 27 26   GLUCOSE 112* 94 109*  BUN 16 13 16   CREATININE 1.36* 1.01 1.15  CALCIUM 8.3* 8.5* 8.6*   Recent Labs    01/02/17 1613 05/16/17 1255  AST 26 17  ALT 14* 10*  ALKPHOS 87 102  BILITOT 0.8 0.4  PROT 7.4 7.1  ALBUMIN 3.5 3.2*   Recent Labs    01/02/17 1613  12/18/2016 0446 01/06/17 0730 05/16/17 1255  WBC 38.2*   < > 10.0 6.6 8.6  NEUTROABS 34.4*  --   --  3.4 5.1  HGB 10.9*   < > 10.0* 10.0* 10.1*  HCT 34.9*   < > 32.2* 32.5* 32.5*  MCV 87.9   < > 87.3 87.8 85.1  PLT 199   < > 162 185 186   < > = values in this interval not displayed.   Lab Results  Component Value Date   TSH 1.323 04/13/2016   Lab Results    Component Value Date   HGBA1C 6.1 (H) 11/12/2014   No results found for: CHOL, HDL, LDLCALC, LDLDIRECT, TRIG, CHOLHDL  Significant Diagnostic Results in last 30 days:  No results found.  Assessment/Plan  #1 hypertension he is not on any medications blood pressure today with systolic of 485 I do not think is totally unusual but he is not symptomatic of any dizziness or syncope largely ambulates in wheelchair at this point will monitor.  He is actually been taken off Flomax and Lasix as well as Lopressor because of hypotension concerns.  2.  History of CHF this is been stable he has minimal edema weights been stable he does not appear symptomatic no complaints of shortness of breath or increased cough.  3.  History of diffuse osteoarthritis she is responded well to [place] routinely twice a day as well as as needed he also has Biofreeze if needed  #4- peripheral vascular disease again this is stabilized I do not really see any significant wounds at this point he does not want aggressive intervention he is on aspirin.  5.  History of numerous skin cancers again he is followed closely by dermatology this appears stable he did receive a short course of Cipro recently for suspected cellulitis left ear this appears resolved.  6.  History of dysphagia he continues on a modified diet we did add a proton pump inhibitor he says he is swallowing fine at this point speech therapy has not indicated any issues.  7.  Atrial fibrillation apparently this was transitory recently during hospitalization and has not really reoccurred to my knowledge he is on aspirin not on  a rate limiting agent as noted above.  8.  History of gout really has not been no reoccurrence since he was started on allopurinol at this point will monitor.  9.  History of aspiration pneumonia again he is on as needed Mucinex and duo nebs as needed at one point he was hospitalized with a significantly elevated white count but made a quick  rebound.  At this point will monitor continue dysphagia diet.  10.  History of restless legs this is stabilized on Requip.  11.  History of depression this appears stable on Zoloft he continues to be bright alert with a cheerful attitude.  12.  History of urinary retention with BPH-- continues with a chronic Foley catheter and has tolerated this well.  13.  History of anemia most likely due to chronic disease hemoglobin has been stable in the 10 range.  14.  History of large abdominal hernia again he is not a good surgical candidate or candidate for any aggressive intervention this appears to be well-tolerated and has not really been an issue to my knowledge during his stay..  15.  History of vitamin D deficiency he is on supplementation vitamin D level was 43.8 in late March.  * [id number]-of note greater than 35 minutes spent assessing patient-discussing status with nursing staff-reviewing his chart labs- coordinating and formulating plan of care for numerous diagnoses-of note greater than 50% of time spent coordinating plan of care

## 2017-07-17 ENCOUNTER — Non-Acute Institutional Stay (SKILLED_NURSING_FACILITY): Payer: Medicare Other | Admitting: Internal Medicine

## 2017-07-17 ENCOUNTER — Encounter: Payer: Self-pay | Admitting: Internal Medicine

## 2017-07-17 ENCOUNTER — Ambulatory Visit (HOSPITAL_COMMUNITY): Payer: Medicare Other | Attending: Internal Medicine

## 2017-07-17 DIAGNOSIS — R05 Cough: Secondary | ICD-10-CM | POA: Insufficient documentation

## 2017-07-17 DIAGNOSIS — J209 Acute bronchitis, unspecified: Secondary | ICD-10-CM | POA: Diagnosis not present

## 2017-07-17 DIAGNOSIS — I4891 Unspecified atrial fibrillation: Secondary | ICD-10-CM | POA: Diagnosis not present

## 2017-07-17 DIAGNOSIS — K5904 Chronic idiopathic constipation: Secondary | ICD-10-CM | POA: Diagnosis not present

## 2017-07-17 DIAGNOSIS — D509 Iron deficiency anemia, unspecified: Secondary | ICD-10-CM

## 2017-07-17 NOTE — Progress Notes (Signed)
Location:   [facility] Room Number: 139/D Place of Service:  SNF (31) Provider:  Veleta Miners  Patient, [name]  Patient Care Team: Patient, [name] as PCP - General (General Practice) Patrick Binder, MD as Consulting Physician (Gastroenterology) Patrick Dad, MD as Consulting Physician [facility])  Extended Emergency Contact Information Primary Emergency Contact: Black,Shirley Address: [address]          [place], [place] [address] [name] of [place] Phone: [phone] Mobile Phone: [phone] Relation: Daughter Secondary Emergency Contact: [facility] Phone: [phone] Relation: Niece  Code Status:  DNR Goals of care: Advanced Directive information Advanced Directives 07/17/2017  Does Patient Have a Medical Advance Directive? Yes  Type of Advance Directive Out of facility DNR (pink MOST or yellow form)  Does patient want to make changes to medical advance directive? No - Patient declined  Copy of [place] in Chart? No - copy requested  Pre-existing out of facility DNR order (yellow form or pink MOST form) -     Chief Complaint  Patient presents with  . Acute Visit    Patient being seen for cough with yellow sputum and slight  rattle in chest area    HPI:  Pt is a 84 y.o. male seen today for an acute visit for Cough for past few days with Productive sputum.   Patient Is Long term resident of facility and has h/o HTN, CHF, Depression, Arthritis, Anemia, GERD, Chronic Foley Cathter due to Urinary retention.PVD ABI in 2015 refused Aggressive approach, Aspiration Pneumonia, Dysphagia, Gout, paroxysmal Atrial Fibrillation not on any anticoagulation Patient says he has been having cough with Yellow sputum with Worsening over past day. Some pain in Lower chest especially with Cough.No SOB. No Fever or chills. Has been eating well He is also c/o Constipation. With no abdominal Pain or Nausea or  vomiting.  Past Medical History:  Diagnosis Date  . Anxiety   . Benign prostatic hyperplasia   . Bilateral foot pain   . CHF (congestive heart failure) ([place])   . Depression   . Diverticulitis   . DJD (degenerative joint disease), cervical   . DNR (do not resuscitate)   . Esophageal dysmotility    age related per BPE 04/2015  . Esophageal stricture   . Falling   . GERD (gastroesophageal reflux disease)   . Gout   . Hiatal hernia   . History of recurrent TIAs   . HTN (hypertension)   . Hyperlipidemia   . Inguinal hernia   . Ischemic heart disease   . Kidney stone   . Osteoarthritis    bilat knees  . Pleural effusion   . Reflux   . Restless leg syndrome   . Right hip pain   . Skin cancer, basal cell   . Urinary retention    Past Surgical History:  Procedure Laterality Date  . ESOPHAGEAL DILATION    . EXPLORATORY LAPAROTOMY W/ BOWEL RESECTION    . FRACTURE SURGERY    . HEMIARTHROPLASTY HIP     Dr. Aline Brochure  . intestines      Allergies  Allergen Reactions  . Celebrex [Celecoxib] Other (See Comments)    Unknown; patient can't remember  . Codeine Other (See Comments)    nausea    Outpatient Encounter Medications as of 07/17/2017  Medication Sig  . acetaminophen (TYLENOL) 325 MG tablet Take 650 mg by mouth every 4 (four) hours as needed.  Marland Kitchen allopurinol (  ZYLOPRIM) 100 MG tablet Take 100 mg by mouth daily.  Marland Kitchen aspirin EC 81 MG tablet Take 81 mg by mouth daily.  . Cholecalciferol (VITAMIN D) 2000 UNITS CAPS Take 1 capsule by mouth daily.  Marland Kitchen docusate sodium (COLACE) 100 MG capsule Take 1 capsule (100 mg total) by mouth 2 (two) times daily. For constipation.  Marland Kitchen guaiFENesin (MUCINEX) 600 MG 12 hr tablet Take 600 mg 2 (two) times daily as needed by mouth for cough or to loosen phlegm.   Marland Kitchen HYDROcodone-acetaminophen (NORCO/VICODIN) 5-325 MG tablet Take 1 tablet by mouth every evening.   Marland Kitchen HYDROcodone-acetaminophen (NORCO/VICODIN) 5-325 MG tablet Take 1 tablet by mouth once  daily and every 6 hours as needed for pain  . ipratropium-albuterol (DUONEB) 0.5-2.5 (3) MG/3ML SOLN Take 3 mLs every 6 (six) hours as needed by nebulization (for shortness of breath).  . magnesium hydroxide (MILK OF MAGNESIA) 400 MG/5ML suspension Take 30 mLs by mouth daily as needed for mild constipation. If no relief try a Fleets enema, after 2 days.  . Menthol, Topical Analgesic, (BIOFREEZE) 4 % GEL Apply small to back as needed for pain  . Neomycin-Bacitracin-Polymyxin ([employer] ANTIBIOTIC) [phone] OINT Cleanse left earlobe  W/NS pat dry and apply triple antibiotic ointment daily and prn until healed once a day  . omeprazole (PRILOSEC) 20 MG capsule Take 20 mg by mouth daily.  Marland Kitchen Propylene Glycol 0.6 % SOLN Apply 1 drop to both eyes twice a day  . rOPINIRole (REQUIP) 2 MG tablet Take 2 mg by mouth at bedtime.  . sertraline (ZOLOFT) 25 MG tablet Take 37.5 mg by mouth daily.    No facility-administered encounter medications on file as of 07/17/2017.      Review of Systems  Constitutional: Negative.   HENT: Negative.   Respiratory: Positive for cough and chest tightness.   Cardiovascular: Negative for chest pain and leg swelling.  Gastrointestinal: Positive for constipation.  Genitourinary: Negative.   Musculoskeletal: Negative.   Neurological: Negative.   Psychiatric/Behavioral: Negative.     Immunization History  Administered Date(s) Administered  . Influenza,inj,Quad PF,6+ Mos 12/07/2014  . Influenza-Unspecified 11/20/2013, 11/13/2015, 11/18/2016  . PPD Test 03/16/2013  . Pneumococcal-Unspecified 02/14/2009, 11/25/2015  . Tdap 11/10/2016   Pertinent  Health Maintenance Due  Topic Date Due  . PNA vac Low Risk Adult (2 of 2 - PCV13) 07/30/2017 (Originally 11/24/2016)  . INFLUENZA VACCINE  09/14/2017   Fall Risk  11/10/2016  Falls in the past year? No   Functional Status Survey:    Vitals:   07/17/17 0939  BP: (!) 103/57  Pulse: 88  Resp: 20  Temp: 97.7 F (36.5 C)   TempSrc: Oral  SpO2: 96%   There is no height or weight on file to calculate BMI. Physical Exam  Constitutional: He is oriented to person, place, and time. He appears well-developed and well-nourished.  HENT:  Head: Normocephalic.  Mouth/Throat: Oropharynx is clear and moist.  Eyes: Pupils are equal, round, and reactive to light.  Neck: Neck supple.  Cardiovascular: Normal rate and regular rhythm.  No murmur heard. Pulmonary/Chest: Effort normal and breath sounds normal. No stridor. No respiratory distress. He has no wheezes.  Abdominal: Soft. Bowel sounds are normal. He exhibits no distension. There is no tenderness. There is no guarding.  Musculoskeletal: He exhibits no edema.  Trace edema Bilateral  Neurological: He is alert and oriented to person, place, and time.  Skin: Skin is warm and dry.  Psychiatric: He has a normal mood and  affect. His behavior is normal. Thought content normal.    Labs reviewed: Recent Labs    01/06/17 0730 01/09/17 0400 05/16/17 1255  NA 132* 133* 134*  K 3.7 4.5 4.5  CL 100* 99* 100*  CO2 23 27 26   GLUCOSE 112* 94 109*  BUN 16 13 16   CREATININE 1.36* 1.01 1.15  CALCIUM 8.3* 8.5* 8.6*   Recent Labs    01/02/17 1613 05/16/17 1255  AST 26 17  ALT 14* 10*  ALKPHOS 87 102  BILITOT 0.8 0.4  PROT 7.4 7.1  ALBUMIN 3.5 3.2*   Recent Labs    01/02/17 1613  01/05/2017 0446 01/06/17 0730 05/16/17 1255  WBC 38.2*   < > 10.0 6.6 8.6  NEUTROABS 34.4*  --   --  3.4 5.1  HGB 10.9*   < > 10.0* 10.0* 10.1*  HCT 34.9*   < > 32.2* 32.5* 32.5*  MCV 87.9   < > 87.3 87.8 85.1  PLT 199   < > 162 185 186   < > = values in this interval not displayed.   Lab Results  Component Value Date   TSH 1.323 04/13/2016   Lab Results  Component Value Date   HGBA1C 6.1 (H) 11/12/2014   No results found for: CHOL, HDL, LDLCALC, LDLDIRECT, TRIG, CHOLHDL  Significant Diagnostic Results in last 30 days:  No results found.  Assessment/Plan Acute  Bronchitis Chest Xray was negative for any Acute Process. Will start him On Zithromax  Also On Mucinex  Atrial fibrillation with RVRspontaneously resolved Patient had one episode in the past when he was admitted for Pneumonia. No Anticoagulation due to his age and frailty. Continue on aspirin. No more episodes in the facility.  Dysphagia On modified D 3 diet  PVD Patient refuses further work up. Continue on aspirin GERD On Prilosec and now doing well.  Iron deficiency anemia,  Hgb Stable No aggressive work up due to his age. Gout Has not have Toe pain since he has been on Allopurinol Constipation Start on Miralax Half packet QD Depression Doing wellOn Zoloft Osteoarthritis Pain controlled on Hydrocodone  Restless legs Doing well on Requip  Urinary Retention Continue Foley Cathter.  Family/ staff Communication:   Labs/tests ordered:   Total time spent in this patient care encounter was 25_ minutes; greater than 50% of the visit spent counseling patient, reviewing records , Labs and coordinating care for problems addressed at this encounter.

## 2017-07-27 DIAGNOSIS — Z08 Encounter for follow-up examination after completed treatment for malignant neoplasm: Secondary | ICD-10-CM | POA: Diagnosis not present

## 2017-07-27 DIAGNOSIS — L570 Actinic keratosis: Secondary | ICD-10-CM | POA: Diagnosis not present

## 2017-07-27 DIAGNOSIS — X32XXXD Exposure to sunlight, subsequent encounter: Secondary | ICD-10-CM | POA: Diagnosis not present

## 2017-07-27 DIAGNOSIS — Z85828 Personal history of other malignant neoplasm of skin: Secondary | ICD-10-CM | POA: Diagnosis not present

## 2017-07-31 ENCOUNTER — Non-Acute Institutional Stay (SKILLED_NURSING_FACILITY): Payer: Medicare Other | Admitting: Internal Medicine

## 2017-07-31 DIAGNOSIS — R059 Cough, unspecified: Secondary | ICD-10-CM

## 2017-07-31 DIAGNOSIS — J069 Acute upper respiratory infection, unspecified: Secondary | ICD-10-CM | POA: Diagnosis not present

## 2017-07-31 DIAGNOSIS — R062 Wheezing: Secondary | ICD-10-CM | POA: Diagnosis not present

## 2017-07-31 DIAGNOSIS — R0989 Other specified symptoms and signs involving the circulatory and respiratory systems: Secondary | ICD-10-CM | POA: Diagnosis not present

## 2017-07-31 DIAGNOSIS — R05 Cough: Secondary | ICD-10-CM

## 2017-08-01 ENCOUNTER — Encounter: Payer: Self-pay | Admitting: Internal Medicine

## 2017-08-01 NOTE — Progress Notes (Signed)
This is an acute visit.  Level of care is skilled facility is [facility] nursing.  Chief complaint acute visit secondary to cough and congestion.  Story of present illness.  Patient is a pleasant 84 year old male.  Seen today for increased cough and chest congestion.  Patient was seen for similar symptoms 2 weeks ago-and treated with antibiotic as well as Mucinex- he also has nebulizers and as needed.  This appeared to resolve.  However this evening nursing is noted some increased cough and chest congestion-he also says he is having some yellow phlegm production.  He denies any shortness of breath says he feels fine.  Vital signs are stable he is afebrile and saturation is in the 90s on room air  He does have a history of numerous chronic medical issues appear to be stable- this includes   HTN, CHF, Depression, Arthritis, Anemia, GERD, Chronic Foley Cathter due to Urinary retention.PVD ABI in 2015 refused Aggressive approach, Aspiration Pneumonia, Dysphagia, Gout, paroxysmal Atrial Fibrillation not on any anticoagulation   Past Medical History:  Diagnosis Date  . Anxiety   . Benign prostatic hyperplasia   . Bilateral foot pain   . CHF (congestive heart failure) ([place])   . Depression   . Diverticulitis   . DJD (degenerative joint disease), cervical   . DNR (do not resuscitate)   . Esophageal dysmotility    age related per BPE 04/2015  . Esophageal stricture   . Falling   . GERD (gastroesophageal reflux disease)   . Gout   . Hiatal hernia   . History of recurrent TIAs   . HTN (hypertension)   . Hyperlipidemia   . Inguinal hernia   . Ischemic heart disease   . Kidney stone   . Osteoarthritis    bilat knees  . Pleural effusion   . Reflux   . Restless leg syndrome   . Right hip pain   . Skin cancer, basal cell   . Urinary retention         Past Surgical History:  Procedure Laterality Date  . ESOPHAGEAL DILATION    . EXPLORATORY  LAPAROTOMY W/ BOWEL RESECTION    . FRACTURE SURGERY    . HEMIARTHROPLASTY HIP     Dr. Aline Brochure  . intestines           Allergies  Allergen Reactions  . Celebrex [Celecoxib] Other (See Comments)    Unknown; patient can't remember  . Codeine Other (See Comments)    nausea           Medication Sig  . acetaminophen (TYLENOL) 325 MG tablet Take 650 mg by mouth every 4 (four) hours as needed.  Marland Kitchen allopurinol (ZYLOPRIM) 100 MG tablet Take 100 mg by mouth daily.  Marland Kitchen aspirin EC 81 MG tablet Take 81 mg by mouth daily.  . Cholecalciferol (VITAMIN D) 2000 UNITS CAPS Take 1 capsule by mouth daily.  Marland Kitchen docusate sodium (COLACE) 100 MG capsule Take 1 capsule (100 mg total) by mouth 2 (two) times daily. For constipation.  Marland Kitchen guaiFENesin (MUCINEX) 600 MG 12 hr tablet Take 600 mg 2 (two) times daily as needed by mouth for cough or to loosen phlegm.   Marland Kitchen HYDROcodone-acetaminophen (NORCO/VICODIN) 5-325 MG tablet Take 1 tablet by mouth every evening.   Marland Kitchen HYDROcodone-acetaminophen (NORCO/VICODIN) 5-325 MG tablet Take 1 tablet by mouth once daily and every 6 hours as needed for pain  . ipratropium-albuterol (DUONEB) 0.5-2.5 (3) MG/3ML SOLN Take 3 mLs every 6 (six) hours as needed  by nebulization (for shortness of breath).  . magnesium hydroxide (MILK OF MAGNESIA) 400 MG/5ML suspension Take 30 mLs by mouth daily as needed for mild constipation. If no relief try a Fleets enema, after 2 days.  . Menthol, Topical Analgesic, (BIOFREEZE) 4 % GEL Apply small to back as needed for pain  . Neomycin-Bacitracin-Polymyxin ([employer] ANTIBIOTIC) [phone] OINT Cleanse left earlobe  W/NS pat dry and apply triple antibiotic ointment daily and prn until healed once a day  . omeprazole (PRILOSEC) 20 MG capsule Take 20 mg by mouth daily.  Marland Kitchen Propylene Glycol 0.6 % SOLN Apply 1 drop to both eyes twice a day  . rOPINIRole (REQUIP) 2 MG tablet Take 2 mg by mouth at bedtime.  . sertraline (ZOLOFT) 25 MG tablet Take  37.5 mg by mouth daily.    No facility-administered encounter medications on file as of 07/17/2017.     Review of systems  In general is not complaining of any fever or chills.  Skin does not complain of any rashes or itching.  Head ears eyes nose mouth and throat-does not complain of visual changes or sore throat.  Respiratory does not complain of shortness of breath he does have a cough productive of yellow phlegm-.  Cardiac is not complaining of chest pain has mild lower extremity edema.  GI does not complain of abdominal discomfort nausea vomiting diarrhea or constipation  GU he does have an indwelling Foley catheter does not complain of dysuria   Musculoskeletal is  not complaining of joint pain currently  Neurologic does not complain of dizziness headache or numbness  Psych does not complain of being depressed or anxious- is on an antidepressant  Physical exam  Temperature is 97.0-pulse 72- blood pressure 130/58- respirations of 18- O2 saturation is 93% on room air  In general this is a pleasant elderly male in no distress lying comfortably in bed.  His skin is warm and dry.  He does have numerous solar-induced changes most prominently on his face and arms.  Eyes sclera and conjunctive are clear visual acuity appears grossly intact.  Oropharynx is clear mucous membranes moist.  Chest is diffuse congestion but there is no labored breathing there are diffuse coarse breath sounds.  Heart is distant heart sounds from what I was able to auscultate regular rate and rhythm he has mild lower extremity edema.  Abdomen is soft nontender he has a large right --sided hernia that appears at baseline  Neurologic- is grossly intact his speech is clear no lateralizing findings.  Psych continues to be largely alert and oriented pleasant and appropriate.  Labs.  Sodium 134 potassium 4.5 BUN 16 creatinine 1.15  WBC 8.6 hemoglobin 10.1 platelets 186  Assessment and  plan.  1.-  Cough-congestion--suspect recurrent URI- responded well to a course of Zithromax will restart this- also will add Mucinex 600 mg twice daily for 7 days.  Will make DuoNeb's routine every 6 hours for 72 hours and then as needed.  Also will update lab work including a CBC with differential and BMP in the morning.  Monitor vital signs pulse ox every shift for 72 hours.  Also will obtain a chest x-ray.  [id number]- of note greater than 25 minutes spent assessing patient-reviewing his chart and labs- discussing his status with nursing staff- coordinating and formulating a plan of care-- of note greater than 50% of time spent coordinating a plan of care

## 2017-08-03 ENCOUNTER — Encounter (HOSPITAL_COMMUNITY)
Admission: RE | Admit: 2017-08-03 | Discharge: 2017-08-03 | Disposition: A | Discharge status: Home / Self Care | Payer: Medicare Other | Source: Skilled Nursing Facility | Attending: Internal Medicine | Admitting: Internal Medicine

## 2017-08-03 DIAGNOSIS — M1000 Idiopathic gout, unspecified site: Secondary | ICD-10-CM | POA: Insufficient documentation

## 2017-08-03 DIAGNOSIS — R05 Cough: Secondary | ICD-10-CM | POA: Insufficient documentation

## 2017-08-03 DIAGNOSIS — K4090 Unilateral inguinal hernia, without obstruction or gangrene, not specified as recurrent: Secondary | ICD-10-CM | POA: Diagnosis not present

## 2017-08-03 DIAGNOSIS — R339 Retention of urine, unspecified: Secondary | ICD-10-CM | POA: Diagnosis not present

## 2017-08-03 DIAGNOSIS — I509 Heart failure, unspecified: Secondary | ICD-10-CM | POA: Diagnosis not present

## 2017-08-03 DIAGNOSIS — R509 Fever, unspecified: Secondary | ICD-10-CM | POA: Diagnosis not present

## 2017-08-03 DIAGNOSIS — D6489 Other specified anemias: Secondary | ICD-10-CM | POA: Diagnosis not present

## 2017-08-03 DIAGNOSIS — I259 Chronic ischemic heart disease, unspecified: Secondary | ICD-10-CM | POA: Diagnosis not present

## 2017-08-03 LAB — BASIC METABOLIC PANEL
Anion gap: 7 (ref 5–15)
BUN: 13 mg/dL (ref 6–20)
CO2: 26 mmol/L (ref 22–32)
Calcium: 8.3 mg/dL — ABNORMAL LOW (ref 8.9–10.3)
Chloride: 101 mmol/L (ref 101–111)
Creatinine, Ser: 1.11 mg/dL (ref 0.61–1.24)
GFR calc Af Amer: 60 mL/min — ABNORMAL LOW (ref >60)
GFR, EST NON AFRICAN AMERICAN: 52 mL/min — AB (ref >60)
Glucose, Bld: 106 mg/dL — ABNORMAL HIGH (ref 65–99)
POTASSIUM: 4.5 mmol/L (ref 3.5–5.1)
Sodium: 134 mmol/L — ABNORMAL LOW (ref 135–145)

## 2017-08-03 LAB — CBC WITH DIFFERENTIAL/PLATELET
Basophils Absolute: 0 10*3/uL (ref 0.0–0.1)
Basophils Relative: 0 %
EOS PCT: 2 %
Eosinophils Absolute: 0.1 10*3/uL (ref 0.0–0.7)
HEMATOCRIT: 30 % — AB (ref 39.0–52.0)
Hemoglobin: 9.5 g/dL — ABNORMAL LOW (ref 13.0–17.0)
LYMPHS PCT: 19 %
Lymphs Abs: 1.4 10*3/uL (ref 0.7–4.0)
MCH: 27 pg (ref 26.0–34.0)
MCHC: 31.7 g/dL (ref 30.0–36.0)
MCV: 85.2 fL (ref 78.0–100.0)
MONO ABS: 1.4 10*3/uL — AB (ref 0.1–1.0)
Monocytes Relative: 19 %
Neutro Abs: 4.4 10*3/uL (ref 1.7–7.7)
Neutrophils Relative %: 60 %
PLATELETS: 167 10*3/uL (ref 150–400)
RBC: 3.52 MIL/uL — AB (ref 4.22–5.81)
RDW: 15.5 % (ref 11.5–15.5)
WBC: 7.3 10*3/uL (ref 4.0–10.5)

## 2017-08-04 ENCOUNTER — Other Ambulatory Visit: Payer: Self-pay

## 2017-08-04 MED ORDER — HYDROCODONE-ACETAMINOPHEN 5-325 MG PO TABS: ORAL_TABLET | ORAL | 0 refills | Status: DC

## 2017-08-04 NOTE — Telephone Encounter (Signed)
RX Fax for [facility]@ [phone]  

## 2017-08-07 ENCOUNTER — Encounter: Payer: Self-pay | Admitting: Internal Medicine

## 2017-08-07 ENCOUNTER — Non-Acute Institutional Stay (SKILLED_NURSING_FACILITY): Payer: Medicare Other | Admitting: Internal Medicine

## 2017-08-07 DIAGNOSIS — J418 Mixed simple and mucopurulent chronic bronchitis: Secondary | ICD-10-CM | POA: Diagnosis not present

## 2017-08-07 DIAGNOSIS — I4891 Unspecified atrial fibrillation: Secondary | ICD-10-CM

## 2017-08-07 DIAGNOSIS — R1310 Dysphagia, unspecified: Secondary | ICD-10-CM

## 2017-08-07 DIAGNOSIS — I509 Heart failure, unspecified: Secondary | ICD-10-CM | POA: Diagnosis not present

## 2017-08-07 NOTE — Progress Notes (Signed)
Location:   [facility] Room Number: 139/D Place of Service:  SNF (31) Provider:  Veleta Miners  Patient, [name]  Patient Care Team: Patient, [name] as PCP - General (General Practice) Patrick Binder, MD as Consulting Physician (Gastroenterology) Patrick Dad, MD as Consulting Physician [facility])  Extended Emergency Contact Information Primary Emergency Contact: Patrick,[name] Address: [address]          [place], [place] [address] Patrick [name] Phone: [phone] Mobile Phone: [phone] Relation: Daughter Secondary Emergency Contact: [facility] Phone: [phone] Relation: Niece  Code Status:  DNR Goals of care: Advanced Directive information Advanced Directives 08/07/2017  Does Patient Have a Medical Advance Directive? Yes  Type of Advance Directive Out of facility DNR (pink MOST or yellow form)  Does patient want to make changes to medical advance directive? No - Patient declined  Copy of [place] in Chart? No - copy requested  Pre-existing out of facility DNR order (yellow form or pink MOST form) -     Chief Complaint  Patient presents with  . Acute Visit    Patients c/o Still having  Respiratory Problems    HPI:  Pt is a 84 y.o. male seen today for an acute visit for continuous coughing with Sputum.  Patient Is Long term resident of facility and has h/o HTN, CHF, Depression, Arthritis, Anemia, GERD, Chronic Foley Cathter due to Urinary retention.PVD ABI in 2015 refused Aggressive approach, Aspiration Pneumonia, Dysphagia, Gout, paroxysmal Atrial Fibrillation not on any anticoagulation  Patient continues to have cough with productive sputum.  His  repeat x-rays have been negative for any infiltrate.  He has received 2 courses of antibiotics. He continues to be on duo nebs as needed.  Patient states his cough has not improved at all.  He denies any chest pain.  No fever or chills.  Mild  shortness of breath.  Past Medical History:  Diagnosis Date  . Anxiety   . Benign prostatic hyperplasia   . Bilateral foot pain   . CHF (congestive heart failure) ([place])   . Depression   . Diverticulitis   . DJD (degenerative joint disease), cervical   . DNR (do not resuscitate)   . Esophageal dysmotility    age related per BPE 04/2015  . Esophageal stricture   . Falling   . GERD (gastroesophageal reflux disease)   . Gout   . Hiatal hernia   . History of recurrent TIAs   . HTN (hypertension)   . Hyperlipidemia   . Inguinal hernia   . Ischemic heart disease   . Kidney stone   . Osteoarthritis    bilat knees  . Pleural effusion   . Reflux   . Restless leg syndrome   . Right hip pain   . Skin cancer, basal cell   . Urinary retention    Past Surgical History:  Procedure Laterality Date  . ESOPHAGEAL DILATION    . EXPLORATORY LAPAROTOMY W/ BOWEL RESECTION    . FRACTURE SURGERY    . HEMIARTHROPLASTY HIP     Dr. Aline Brochure  . intestines      Allergies  Allergen Reactions  . Celebrex [Celecoxib] Other (See Comments)    Unknown; patient can't remember  . Codeine Other (See Comments)    nausea    Outpatient Encounter Medications as of 08/07/2017  Medication Sig  . acetaminophen (TYLENOL) 325 MG tablet Take 650 mg by mouth every 4 (four) hours  as needed.  Marland Kitchen allopurinol (ZYLOPRIM) 100 MG tablet Take 100 mg by mouth daily.  Marland Kitchen aspirin EC 81 MG tablet Take 81 mg by mouth daily.  . Cholecalciferol (VITAMIN D) 2000 UNITS CAPS Take 1 capsule by mouth daily.  Marland Kitchen docusate sodium (COLACE) 100 MG capsule Take 1 capsule (100 mg total) by mouth 2 (two) times daily. For constipation.  Marland Kitchen guaiFENesin (MUCINEX) 600 MG 12 hr tablet Take 600 mg 2 (two) times daily as needed by mouth for cough or to loosen phlegm.   Marland Kitchen HYDROcodone-acetaminophen (NORCO/VICODIN) 5-325 MG tablet Take 1 tablet by mouth every evening.   Marland Kitchen HYDROcodone-acetaminophen (NORCO/VICODIN) 5-325 MG tablet Take 1 tablet by  mouth once daily and every 6 hours as needed for pain  . ipratropium-albuterol (DUONEB) 0.5-2.5 (3) MG/3ML SOLN Take 3 mLs every 6 (six) hours as needed by nebulization (for shortness of breath).  . magnesium hydroxide (MILK OF MAGNESIA) 400 MG/5ML suspension Take 30 mLs by mouth daily as needed for mild constipation. If no relief try a Fleets enema, after 2 days.  . Menthol, Topical Analgesic, (BIOFREEZE) 4 % GEL Apply small to back as needed for pain  . omeprazole (PRILOSEC) 20 MG capsule Take 20 mg by mouth daily.  . polyethylene glycol (MIRALAX / GLYCOLAX) packet Take 17 g by mouth daily.  Marland Kitchen Propylene Glycol 0.6 % SOLN Apply 1 drop to both eyes twice a day  . rOPINIRole (REQUIP) 2 MG tablet Take 2 mg by mouth at bedtime.  . sertraline (ZOLOFT) 25 MG tablet Take 37.5 mg by mouth at bedtime.  . [DISCONTINUED] Neomycin-Bacitracin-Polymyxin ([employer] ANTIBIOTIC) [phone] OINT Cleanse left earlobe  W/NS pat dry and apply triple antibiotic ointment daily and prn until healed once a day  . [DISCONTINUED] sertraline (ZOLOFT) 25 MG tablet Take 37.5 mg by mouth daily.    No facility-administered encounter medications on file as of 08/07/2017.      Review of Systems  Review of Systems  Constitutional: Negative for activity change, appetite change, chills, diaphoresis, fatigue and fever.  HENT: Negative for mouth sores, postnasal drip, rhinorrhea, sinus pain and sore throat.   Respiratory: Negative for apnea,  chest tightness, shortness of breath and wheezing.   Cardiovascular: Negative for chest pain, palpitations and leg swelling.  Gastrointestinal: Negative for abdominal distention, abdominal pain, constipation, diarrhea, nausea and vomiting.  Genitourinary: Negative for dysuria and frequency.  Musculoskeletal: Negative for arthralgias, joint swelling and myalgias.  Skin: Negative for rash.  Neurological: Negative for dizziness, syncope, weakness, light-headedness and numbness.    Psychiatric/Behavioral: Negative for behavioral problems, confusion and sleep disturbance.     Immunization History  Administered Date(s) Administered  . Influenza,inj,Quad PF,6+ Mos 12/07/2014  . Influenza-Unspecified 11/20/2013, 11/13/2015, 11/18/2016  . PPD Test 03/16/2013  . Pneumococcal-Unspecified 02/14/2009, 11/25/2015  . Tdap 11/10/2016   Pertinent  Health Maintenance Due  Topic Date Due  . PNA vac Low Risk Adult (2 of 2 - PCV13) 09/06/2017 (Originally 11/24/2016)  . INFLUENZA VACCINE  09/14/2017   Fall Risk  11/10/2016  Falls in the past year? No   Functional Status Survey:    Vitals:   08/07/17 1157  BP: (!) 100/58  Pulse: 86  Temp: 98.8 F (37.1 C)  TempSrc: Oral   There is no height or weight on file to calculate BMI. Physical Exam  Constitutional: He is oriented to person, place, and time. He appears well-developed and well-nourished.  HENT:  Head: Normocephalic.  Mouth/Throat: Oropharynx is clear and moist.  Eyes: Pupils  are equal, round, and reactive to light.  Neck: Neck supple.  Pulmonary/Chest: Effort normal.  Had Expiratory Wheezing Bilateral  Abdominal: Soft. Bowel sounds are normal. He exhibits no distension. There is no tenderness. There is no guarding.  Musculoskeletal: He exhibits no edema.  Neurological: He is alert and oriented to person, place, and time.  Skin: Skin is warm and dry.  Psychiatric: He has a normal mood and affect. His behavior is normal. Thought content normal.    Labs reviewed: Recent Labs    01/09/17 0400 05/16/17 1255 08/03/17 0703  NA 133* 134* 134*  K 4.5 4.5 4.5  CL 99* 100* 101  CO2 27 26 26   GLUCOSE 94 109* 106*  BUN 13 16 13   CREATININE 1.01 1.15 1.11  CALCIUM 8.5* 8.6* 8.3*   Recent Labs    01/02/17 1613 05/16/17 1255  AST 26 17  ALT 14* 10*  ALKPHOS 87 102  BILITOT 0.8 0.4  PROT 7.4 7.1  ALBUMIN 3.5 3.2*   Recent Labs    01/06/17 0730 05/16/17 1255 08/03/17 0703  WBC 6.6 8.6 7.3   NEUTROABS 3.4 5.1 4.4  HGB 10.0* 10.1* 9.5*  HCT 32.5* 32.5* 30.0*  MCV 87.8 85.1 85.2  PLT 185 186 167   Lab Results  Component Value Date   TSH 1.323 04/13/2016   Lab Results  Component Value Date   HGBA1C 6.1 (H) 11/12/2014   No results found for: CHOL, HDL, LDLCALC, LDLDIRECT, TRIG, CHOLHDL  Significant Diagnostic Results in last 30 days:  Dg Chest 2 View  Result Date: 07/17/2017 CLINICAL DATA:  Productive cough EXAM: CHEST - 2 VIEW COMPARISON:  01/02/2017 FINDINGS: Cardiac shadow is stable. Aortic calcifications are again seen. The lungs are well aerated bilaterally. No focal infiltrate or sizable effusion is seen. Minimal bibasilar atelectatic changes are noted. No acute bony abnormality is seen. IMPRESSION: Minimal basilar atelectasis. Electronically Signed   By: Inez Catalina M.D.   On: 07/17/2017 11:25    Assessment/Plan  Chronic Bronchitis Chest Xray has been  negative for any Acute Process. Has recently finished antibiotics We will start him on prednisone 40 mg daily for 5 days Duo nebs twice daily and as needed  Atrial fibrillation with RVRspontaneously resolved Patient hadone episode in the past when he was admitted for Pneumonia. No Anticoagulation due to his age and frailty. Continue on aspirin. No more episodes in the facility.  Dysphagia On modified D 3 diet His cough can be related to him being noncompliant with his diet Discussed with the speech due to his age would not make any changes right now  PVD Patient refuses further work up. Continue on aspirin GERD On Prilosec and now doing well.  Iron deficiency anemia,  Hgb Stable No aggressive work up due to his age. Gout Has not have Toe pain since he has been on Allopurinol Constipation Start on Miralax Half packet QD Depression Doing wellOn Zoloft Osteoarthritis Pain controlled on Hydrocodone  Restless legs Doing well on Requip  Urinary Retention Continue Foley  Cathter.   Family/ staff Communication:   Labs/tests ordered:

## 2017-08-14 DIAGNOSIS — R262 Difficulty in walking, not elsewhere classified: Secondary | ICD-10-CM | POA: Diagnosis not present

## 2017-08-14 DIAGNOSIS — I739 Peripheral vascular disease, unspecified: Secondary | ICD-10-CM | POA: Diagnosis not present

## 2017-08-14 DIAGNOSIS — B351 Tinea unguium: Secondary | ICD-10-CM | POA: Diagnosis not present

## 2017-08-31 ENCOUNTER — Other Ambulatory Visit: Payer: Self-pay

## 2017-08-31 MED ORDER — HYDROCODONE-ACETAMINOPHEN 5-325 MG PO TABS: 1.0000 | ORAL_TABLET | Freq: Two times a day (BID) | ORAL | 0 refills | Status: DC

## 2017-09-11 ENCOUNTER — Encounter: Payer: Self-pay | Admitting: Internal Medicine

## 2017-09-11 ENCOUNTER — Non-Acute Institutional Stay (SKILLED_NURSING_FACILITY): Payer: Medicare Other | Admitting: Internal Medicine

## 2017-09-11 DIAGNOSIS — K219 Gastro-esophageal reflux disease without esophagitis: Secondary | ICD-10-CM | POA: Diagnosis not present

## 2017-09-11 DIAGNOSIS — R1310 Dysphagia, unspecified: Secondary | ICD-10-CM

## 2017-09-11 DIAGNOSIS — R059 Cough, unspecified: Secondary | ICD-10-CM

## 2017-09-11 DIAGNOSIS — I509 Heart failure, unspecified: Secondary | ICD-10-CM

## 2017-09-11 DIAGNOSIS — I4891 Unspecified atrial fibrillation: Secondary | ICD-10-CM | POA: Diagnosis not present

## 2017-09-11 DIAGNOSIS — R05 Cough: Secondary | ICD-10-CM

## 2017-09-11 DIAGNOSIS — J690 Pneumonitis due to inhalation of food and vomit: Secondary | ICD-10-CM | POA: Diagnosis not present

## 2017-09-11 NOTE — Progress Notes (Signed)
Location:   [facility] Room Number: 139/D Place of Service:  SNF (31) Provider:  [place] Lewis  Patient, [name]  Patient Care Team: Patient, [name] as PCP - General (General Practice) Danie Binder, MD as Consulting Physician (Gastroenterology) [name], MD as Consulting Physician [facility])  Extended Emergency Contact Information Primary Emergency Contact: Black,Shirley Address: [address]          [place], [place] [address] [name] of [place] Phone: [phone] Mobile Phone: [phone] Relation: Daughter Secondary Emergency Contact: [facility] Phone: [phone] Relation: Niece  Code Status:  DNR Goals of care: Advanced Directive information Advanced Directives 09/11/2017  Does Patient Have a Medical Advance Directive? Yes  Type of Advance Directive Out of facility DNR (pink MOST or yellow form)  Does patient want to make changes to medical advance directive? No - Patient declined  Copy of [place] in Chart? No - copy requested  Pre-existing out of facility DNR order (yellow form or pink MOST form) -     Chief Complaint  Patient presents with  . Acute Visit    Patients c/o Cough    HPI:  Pt is a 84 y.o. male seen today for an acute visit for what was described as a cough-.  Patient however is not really complaining of cough today except he says when he eats at times he has some cough-vomiting.  He has have a history of dysphasia and has been evaluated by speech therapy- He is on a dysphagia diet on aspiration precautions.  He states the coughing is worse with water and when he has gravy-he says he tolerates his cereal well as well as iced tea.  I did discuss this with speech therapist and she will follow-up- he has been allowed some liberalization because of quality of life issues- he enjoys food and at his advanced age he thought he should be able to enjoy some of this  He does have a  history of aspiration has been treated before on Mucinex which he remains on PRN-he also has PRN DuoNeb's- previously been treated with prednisone as well as a Z-Pak Recent chest x-ray was negative for acute process.  In regards to GERD he is on a proton pump inhibitor.  Vital signs are stable he is actually eating lunch and appears to be doing well this noon  His other medical issues include hypertension as well as CHF-depression-anemia-as well as peripheral vascular disease and A. fib- these appear to be relatively well controlled and stable.  He also has a history of urinary retention and has a chronic indwelling Foley catheter  He does not desire any aggressive work-up of his peripheral vascular disease.  His edema appears to be quite mild.     Past Medical History:  Diagnosis Date  . Anxiety   . Benign prostatic hyperplasia   . Bilateral foot pain   . CHF (congestive heart failure) ([place])   . Depression   . Diverticulitis   . DJD (degenerative joint disease), cervical   . DNR (do not resuscitate)   . Esophageal dysmotility    age related per BPE 04/2015  . Esophageal stricture   . Falling   . GERD (gastroesophageal reflux disease)   . Gout   . Hiatal hernia   . History of recurrent TIAs   . HTN (hypertension)   . Hyperlipidemia   . Inguinal hernia   . Ischemic heart disease   . Kidney  stone   . Osteoarthritis    bilat knees  . Pleural effusion   . Reflux   . Restless leg syndrome   . Right hip pain   . Skin cancer, basal cell   . Urinary retention    Past Surgical History:  Procedure Laterality Date  . ESOPHAGEAL DILATION    . EXPLORATORY LAPAROTOMY W/ BOWEL RESECTION    . FRACTURE SURGERY    . HEMIARTHROPLASTY HIP     Dr. Aline Brochure  . intestines      Allergies  Allergen Reactions  . Celebrex [Celecoxib] Other (See Comments)    Unknown; patient can't remember  . Codeine Other (See Comments)    nausea    Outpatient Encounter Medications as of  09/11/2017  Medication Sig  . acetaminophen (TYLENOL) 325 MG tablet Take 650 mg by mouth every 4 (four) hours as needed.  Marland Kitchen allopurinol (ZYLOPRIM) 100 MG tablet Take 100 mg by mouth daily.  Marland Kitchen aspirin EC 81 MG tablet Take 81 mg by mouth daily.  . Cholecalciferol (VITAMIN D) 2000 UNITS CAPS Take 1 capsule by mouth daily.  Marland Kitchen Dextromethorphan-guaiFENesin (ROBITUSSIN SUGAR FREE) 10-100 MG/5ML liquid Take 5 mLs by mouth every 4 (four) hours as needed.  . docusate sodium (COLACE) 100 MG capsule Take 1 capsule (100 mg total) by mouth 2 (two) times daily. For constipation.  Marland Kitchen guaiFENesin (MUCINEX) 600 MG 12 hr tablet Take 600 mg 2 (two) times daily as needed by mouth for cough or to loosen phlegm.   Marland Kitchen HYDROcodone-acetaminophen (NORCO/VICODIN) 5-325 MG tablet Take 1 tablet by mouth once daily and every 6 hours as needed for pain  . HYDROcodone-acetaminophen (NORCO/VICODIN) 5-325 MG tablet Take 1 tablet by mouth daily.  Marland Kitchen HYDROcodone-acetaminophen (NORCO/VICODIN) 5-325 MG tablet Take 1 tablet by mouth every evening.  Marland Kitchen ipratropium-albuterol (DUONEB) 0.5-2.5 (3) MG/3ML SOLN Take 3 mLs every 6 (six) hours as needed by nebulization (for shortness of breath).  . magnesium hydroxide (MILK OF MAGNESIA) 400 MG/5ML suspension Take 30 mLs by mouth daily as needed for mild constipation. If no relief try a Fleets enema, after 2 days.  . Menthol, Topical Analgesic, (BIOFREEZE) 4 % GEL Apply small to back as needed for pain  . omeprazole (PRILOSEC) 20 MG capsule Take 20 mg by mouth daily.  . polyethylene glycol (MIRALAX / GLYCOLAX) packet Take 17 g by mouth daily.  Marland Kitchen Propylene Glycol 0.6 % SOLN Apply 1 drop to both eyes twice a day  . rOPINIRole (REQUIP) 2 MG tablet Take 2 mg by mouth at bedtime.  . sertraline (ZOLOFT) 25 MG tablet Take 37.5 mg by mouth at bedtime.  . [DISCONTINUED] HYDROcodone-acetaminophen (NORCO/VICODIN) 5-325 MG tablet Take 1 tablet by mouth 2 (two) times daily. (Patient taking differently: Take 1  tablet by mouth daily. )   No facility-administered encounter medications on file as of 09/11/2017.     Review of Systems   General is not complaining of any fever chills.  Skin is not complain of rashes or itching does have numerous solar induced changes followed by dermatology.  Head ears eyes nose mouth and throat is not complaining of sore throat does complain at times of coughing when he eats certain foods.  Respiratory is not complaining of shortness of breath--does complain of cough as noted above.  Cardiac is not complaining of chest pain edema appears to be quite mild.  GI is not complaining of abdominal discomfort nausea vomiting diarrhea or constipation he does have a history of GERD-as well as  a history of a large abdominal hernia.  GU does have an indwelling Foley catheter does not complain of dysuria.  Muscle skeletal is not complaining of joint pain currently.  Neurologic does not complain of dizziness headache or numbness.  And psych does not complain of being depressed or anxious-continues to be in usual good spirits  Immunization History  Administered Date(s) Administered  . Influenza,inj,Quad PF,6+ Mos 12/07/2014  . Influenza-Unspecified 11/20/2013, 11/13/2015, 11/18/2016  . PPD Test 03/16/2013  . Pneumococcal-Unspecified 02/14/2009, 11/25/2015  . Tdap 11/10/2016   Pertinent  Health Maintenance Due  Topic Date Due  . PNA vac Low Risk Adult (2 of 2 - PCV13) 10/12/2017 (Originally 11/24/2016)  . INFLUENZA VACCINE  09/14/2017   Fall Risk  11/10/2016  Falls in the past year? No   Functional Status Survey:    Vitals:   09/11/17 1134  BP: (!) 125/57  Pulse: 69  Resp: 18  Temp: 97.8 F (36.6 C)  TempSrc: Oral  SpO2: 96%    Physical Exam   In general this is a pleasant elderly male in no distress sitting comfortably in his chair-- has general frailty but appears quite vigorous considering his advanced age.  His skin is warm and dry he does have  numerous lower induced changes at baseline several skin cancer removals.  Eyes sclera and conjunctive are clear visual acuity appears grossly intact.  Oropharynx is clear mucous membranes moist  Chest is actually clear to auscultation there is no labored breathing heart is regular rate and rhythm without murmur gallop or rub he has mild trace lower extremity edema.  Abdomen is soft nontender with active bowel sounds he does have a large right-sided abdominal hernia which is nontender  GU has his indwelling Foley catheter.  Musculoskeletal has general frailty but moves all extremities x4 at baseline  Neurologic is grossly intact his speech is clear no lateralizing findings.  Psych he is largely alert and oriented very pleasant and appropriate  Labs reviewed: Recent Labs    01/09/17 0400 05/16/17 1255 08/03/17 0703  NA 133* 134* 134*  K 4.5 4.5 4.5  CL 99* 100* 101  CO2 27 26 26   GLUCOSE 94 109* 106*  BUN 13 16 13   CREATININE 1.01 1.15 1.11  CALCIUM 8.5* 8.6* 8.3*   Recent Labs    01/02/17 1613 05/16/17 1255  AST 26 17  ALT 14* 10*  ALKPHOS 87 102  BILITOT 0.8 0.4  PROT 7.4 7.1  ALBUMIN 3.5 3.2*   Recent Labs    01/06/17 0730 05/16/17 1255 08/03/17 0703  WBC 6.6 8.6 7.3  NEUTROABS 3.4 5.1 4.4  HGB 10.0* 10.1* 9.5*  HCT 32.5* 32.5* 30.0*  MCV 87.8 85.1 85.2  PLT 185 186 167   Lab Results  Component Value Date   TSH 1.323 04/13/2016   Lab Results  Component Value Date   HGBA1C 6.1 (H) 11/12/2014   No results found for: CHOL, HDL, LDLCALC, LDLDIRECT, TRIG, CHOLHDL  Significant Diagnostic Results in last 30 days:  No results found.  Assessment/Plan  #1-cough- at this point patient is denying cough except at times when he eats- he is on PRN Mucinex and DuoNeb's will make DuoNeb's routine for 3 days and then as needed and make Mucinex routine twice daily for a week and then back as needed- he does remain at some risk with aspiration but recent chest  x-ray did not show it- I did discuss this with speech therapist who will reevaluate him-again emphasis is on  quality of life here and enjoying foods that he likes  He does continue on a proton pump inhibitor   with history of GERD   #2- atrial fibrillation this appears rate controlled he is not on a beta-blocker secondary to previous history of hypotension and dizziness-nonetheless this is been stable for an extended period.  3.  CHF this appears stable as well edema is fairly minimal today--chest exam was fairly benign today- is not complaining of shortness of breath.  [id number]--[id number] note greater than 25 minutes spent assessing patient-reviewing his chart and labs- discussing his status with nursing as well as with speech therapist- and coordinating and formulating a plan of care-of note greater than 50% of time spent coordinating a plan of care with input as noted above

## 2017-09-26 ENCOUNTER — Encounter: Payer: Self-pay | Admitting: Internal Medicine

## 2017-09-26 ENCOUNTER — Non-Acute Institutional Stay (SKILLED_NURSING_FACILITY): Payer: Medicare Other | Admitting: Internal Medicine

## 2017-09-26 DIAGNOSIS — I950 Idiopathic hypotension: Secondary | ICD-10-CM | POA: Diagnosis not present

## 2017-09-26 DIAGNOSIS — R1310 Dysphagia, unspecified: Secondary | ICD-10-CM

## 2017-09-26 DIAGNOSIS — I4891 Unspecified atrial fibrillation: Secondary | ICD-10-CM | POA: Diagnosis not present

## 2017-09-26 DIAGNOSIS — I739 Peripheral vascular disease, unspecified: Secondary | ICD-10-CM

## 2017-09-26 DIAGNOSIS — D509 Iron deficiency anemia, unspecified: Secondary | ICD-10-CM | POA: Diagnosis not present

## 2017-09-26 DIAGNOSIS — I509 Heart failure, unspecified: Secondary | ICD-10-CM

## 2017-09-26 NOTE — Progress Notes (Signed)
Location:    [place] Room Number: 139/D Place of Service:  SNF (31) Provider: Granville Lewis PA-C  Patient, [name]  Patient Care Team: Patient, [name] as PCP - General (General Practice) Danie Binder, MD as Consulting Physician (Gastroenterology) [name], MD as Consulting Physician [facility])  Extended Emergency Contact Information Primary Emergency Contact: Black,Shirley Address: [address]          [place], [place] [address] [name] of [place] Phone: [phone] Mobile Phone: [phone] Relation: Daughter Secondary Emergency Contact: [facility] Phone: [phone] Relation: Niece  Code Status:  DNR Goals of care: Advanced Directive information Advanced Directives 09/26/2017  Does Patient Have a Medical Advance Directive? Yes  Type of Advance Directive Out of facility DNR (pink MOST or yellow form)  Does patient want to make changes to medical advance directive? No - Patient declined  Copy of [place] in Chart? No - copy requested  Pre-existing out of facility DNR order (yellow form or pink MOST form) -     Chief Complaint  Patient presents with  . Medical Management of Chronic Issues    Patient is being sen for routine visit of medical management  Medical management of chronic medical conditions including history of bronchitis-atrial fibrillation-dysphasia-peripheral vascular disease-as well as GERD-gout- depression-osteoarthritis-and urinary frequency with Foley catheter placement.  Also a history of anemia most likely chronic disease  HPI:  Pt is a 84 y.o. male seen today for medical management of chronic diseases.  As noted above He continues to be quite stable especially considering his advanced age.  Weight appears to be stable at 167 pounds.  He has had somewhat of a persistent cough which he says has improved- he was thought to have chronic bronchitis and did receive a couple  courses of antibiotics as well as prednisone- duo nebs and Mucinex.  He does continue on PRN duo nebs and Mucinex and says the cough has improved.  He also has a history of atrial fibrillation is on aspirin it is rate controlled-she is not on a beta-blocker or rate limiting agent because of hypotension concerns and bradycardia  He also has a history of peripheral vascular disease but does not want any aggressive work-up which is understandable considering his advanced age he is on aspirin.  At one point he had some significant foot wounds but appears that these have resolved and stabilized  He does have a history of dysphasia and is on a dysphagia 3 diet-there is been some liberalization secondary to quality of life issues he does remain at some risk for aspiration but appears to be doing relatively well in this regards.  He is on a proton pump inhibitor with associated GERD as well.  In regards to osteoarthritis he continues on Vicodin he has an order for every 6 hours and does receive a dose routinely in the morning and also at 4 PM.  He also has a history of urinary retention and continues with a Foley catheter and appears to have tolerated this relatively well.  At one point it was thought he had depression he was put on Zoloft and is done very well with this continues to be in good spirits talkative doing quite remarkably well considering his advanced age  Currently he is sitting in his wheelchair comfortably he is just finished shaving appears to be in his usual good spirits and has no complaints- he does say his cough is better  Past  Medical History:  Diagnosis Date  . Anxiety   . Benign prostatic hyperplasia   . Bilateral foot pain   . CHF (congestive heart failure) ([place])   . Depression   . Diverticulitis   . DJD (degenerative joint disease), cervical   . DNR (do not resuscitate)   . Esophageal dysmotility    age related per BPE 04/2015  . Esophageal stricture   . Falling     . GERD (gastroesophageal reflux disease)   . Gout   . Hiatal hernia   . History of recurrent TIAs   . HTN (hypertension)   . Hyperlipidemia   . Inguinal hernia   . Ischemic heart disease   . Kidney stone   . Osteoarthritis    bilat knees  . Pleural effusion   . Reflux   . Restless leg syndrome   . Right hip pain   . Skin cancer, basal cell   . Urinary retention    Past Surgical History:  Procedure Laterality Date  . ESOPHAGEAL DILATION    . EXPLORATORY LAPAROTOMY W/ BOWEL RESECTION    . FRACTURE SURGERY    . HEMIARTHROPLASTY HIP     Dr. Aline Brochure  . intestines      Allergies  Allergen Reactions  . Celebrex [Celecoxib] Other (See Comments)    Unknown; patient can't remember  . Codeine Other (See Comments)    nausea    Outpatient Encounter Medications as of 09/26/2017  Medication Sig  . acetaminophen (TYLENOL) 325 MG tablet Take 650 mg by mouth every 4 (four) hours as needed.  Marland Kitchen allopurinol (ZYLOPRIM) 100 MG tablet Take 100 mg by mouth daily.  Marland Kitchen aspirin EC 81 MG tablet Take 81 mg by mouth daily.  . Cholecalciferol (VITAMIN D) 2000 UNITS CAPS Take 1 capsule by mouth daily.  Marland Kitchen docusate sodium (COLACE) 100 MG capsule Take 1 capsule (100 mg total) by mouth 2 (two) times daily. For constipation.  Marland Kitchen guaiFENesin (MUCINEX) 600 MG 12 hr tablet Take 600 mg 2 (two) times daily as needed by mouth for cough or to loosen phlegm.   Marland Kitchen HYDROcodone-acetaminophen (NORCO/VICODIN) 5-325 MG tablet Take 1 tablet by mouth once daily and every 6 hours as needed for pain  . HYDROcodone-acetaminophen (NORCO/VICODIN) 5-325 MG tablet Take 1 tablet by mouth daily.  Marland Kitchen HYDROcodone-acetaminophen (NORCO/VICODIN) 5-325 MG tablet Take 1 tablet by mouth every evening.  Marland Kitchen ipratropium-albuterol (DUONEB) 0.5-2.5 (3) MG/3ML SOLN Take 3 mLs every 6 (six) hours as needed by nebulization (for shortness of breath).  . magnesium hydroxide (MILK OF MAGNESIA) 400 MG/5ML suspension Take 30 mLs by mouth daily as needed  for mild constipation. If no relief try a Fleets enema, after 2 days.  . Menthol, Topical Analgesic, (BIOFREEZE) 4 % GEL Apply small to back as needed for pain  . omeprazole (PRILOSEC) 20 MG capsule Take 20 mg by mouth daily.  . polyethylene glycol (MIRALAX / GLYCOLAX) packet Take 17 g by mouth daily.  Marland Kitchen Propylene Glycol 0.6 % SOLN Apply 1 drop to both eyes twice a day  . rOPINIRole (REQUIP) 2 MG tablet Take 2 mg by mouth at bedtime.  . sertraline (ZOLOFT) 25 MG tablet Take 37.5 mg by mouth at bedtime.  . [DISCONTINUED] Dextromethorphan-guaiFENesin (ROBITUSSIN SUGAR FREE) 10-100 MG/5ML liquid Take 5 mLs by mouth every 4 (four) hours as needed.   No facility-administered encounter medications on file as of 09/26/2017.      Review of Systems   In general he says he feels well  weight appears to be stable and does not have any complaints this afternoon   Skin is not complain of rashes or itching does have numerous skin cancers which have been removed by dermatology  Head ears eyes nose mouth and throat is not complain of visual changes or sore throat   Respiratory is not complaining of shortness of breath or cough at this point says his cough has improved   Cardiac is not complaining of chest pain palpitations or increased lower extremity edema   GI does not complain of abdominal discomfort nausea vomiting diarrhea constipation   Musculoskeletal at times will complain of diffuse osteoarthritic pain but is not currently complaining of this apparently takes his Vicodin fairly often   Neurologic is not complaining of dizziness headache or syncope- nursing staff does report at times he will not off and fall asleep fairly suddenly and then wakes up just as suddenly but this is not new  Psych he does not complain of being depressed or anxious again he appears to have done well with the Zoloft  Immunization History  Administered Date(s) Administered  . Influenza,inj,Quad PF,6+ Mos  12/07/2014  . Influenza-Unspecified 11/20/2013, 11/13/2015, 11/18/2016  . PPD Test 03/16/2013  . Pneumococcal-Unspecified 02/14/2009, 11/25/2015  . Tdap 11/10/2016   Pertinent  Health Maintenance Due  Topic Date Due  . PNA vac Low Risk Adult (2 of 2 - PCV13) 10/12/2017 (Originally 11/24/2016)  . INFLUENZA VACCINE  10/27/2017 (Originally 09/14/2017)   Fall Risk  11/10/2016  Falls in the past year? No   Functional Status Survey:    Vitals:   09/26/17 1140  BP: 133/67  Pulse: 70  Resp: 18  Temp: (!) 97.5 F (36.4 C)  TempSrc: Oral  SpO2: 95%  Weight: 167 lb 9.6 oz (76 kg)  Height: 6' (1.829 m)   Body mass index is 22.73 kg/m. Physical Exam  In general this is a pleasant somewhat frail-appearing elderly male who appears to be at his baseline he is bright and alert  His skin is warm and dry he does have numerous areas where skin cancer have been removed he does have diffuse solar induced changes Place where skin cancer was recently removed on his left ear at this point appears early benign  Eyes visual acuity appears to be intact he has prescription lenses sclera and conjunctive are clear  Oropharynx is clear mucous membranes moist   Chest is clear to auscultation there is no labored breathing there is somewhat shallow air entry  Heart is regular rate and rhythm without murmur gallop or rub he has scant lower extremity edema-pedal pulses are somewhat reduced bilaterally  Abdomen is soft nontender with positive bowel sounds he does have a large right-sided abdominal hernia which again appears nontender  GU has an indwelling Foley catheter draining some amber-colored urine   Musculoskeletal has diffuse arthritic changes arms and legs which appears to be baseline but I do not note any sores or wounds on his feet at this time--is able to move all extremities appears at baseline ambulates in wheelchair  Neurologic as noted above no lateralizing findings his speech is  clear  Psych he is largely alert and oriented keeps up with current events watches TV appears to be at baseline       Labs reviewed: Recent Labs    01/09/17 0400 05/16/17 1255 08/03/17 0703  NA 133* 134* 134*  K 4.5 4.5 4.5  CL 99* 100* 101  CO2 27 26 26   GLUCOSE 94 109*  106*  BUN 13 16 13   CREATININE 1.01 1.15 1.11  CALCIUM 8.5* 8.6* 8.3*   Recent Labs    01/02/17 1613 05/16/17 1255  AST 26 17  ALT 14* 10*  ALKPHOS 87 102  BILITOT 0.8 0.4  PROT 7.4 7.1  ALBUMIN 3.5 3.2*   Recent Labs    01/06/17 0730 05/16/17 1255 08/03/17 0703  WBC 6.6 8.6 7.3  NEUTROABS 3.4 5.1 4.4  HGB 10.0* 10.1* 9.5*  HCT 32.5* 32.5* 30.0*  MCV 87.8 85.1 85.2  PLT 185 186 167   Lab Results  Component Value Date   TSH 1.323 04/13/2016   Lab Results  Component Value Date   HGBA1C 6.1 (H) 11/12/2014   No results found for: CHOL, HDL, LDLCALC, LDLDIRECT, TRIG, CHOLHDL  Significant Diagnostic Results in last 30 days:  No results found.  Assessment/Plan  --#1- history of chronic bronchitis- continues on duo nebs and Mucinex as needed-he says his cough has improved- previously has required antibiotics and prednisone at this point will monitor but appears stabilized  #2-history of atrial fibrillation this appears rate controlled he is not on a beta-blocker because of hypotension bradycardia concerns-he is on aspirin for anticoagulation.  3.  History of dysphasia continues on a dysphagia 3 diet- appears to be tolerating this fairly well but does remain an aspiration risk but again there also is consideration for quality of life issues patient enjoys eating  #4-history of peripheral vascular disease actually this appears stabilized he is on aspirin he does not want any aggressive work-up- his wounds appear to have healed.  5.  History of osteoarthritis continues on Vicodin he can get this every 6 hours gets it routinely in the morning as well as 4 PM- pain at this point appears to be  controlled.  6.  History of urinary retention he has tolerated his Foley catheter.  He is not on Flomax because of hypotension concerns in the past.  7.  History of restless leg syndrome he continues on Requip and this appears to have been effective.  8.  History of anemia most likely chronic disease hemoglobin was 9.5 on June will update this 9.5 appears to be on the lower end of his recent baseline.  9.  History depression he appears to be doing very well on Zoloft continues to be bright alert very pleasant and involved.   10.  History of GERD he does continue on a proton pump inhibitor and this appears to be stable.   11- History CHF- is no longer on Lasix secondary to renal issues as well as hypotension- nonetheless weight appears to be stable his edema is quite minimal   #12 history of gout this is been relatively asymptomatic since he has been on allopurinol  Addendum.  After initial assessment I checked back on patient and he appeared to be stable but I did check a blood pressure and actually got a systolic around 80- patient at times was having sleeping episodes but was easily arousable- shortly thereafter blood pressure was rechecked and actually had gone up to 100/52.  Continued to deny any syncope or feeling any different said he felt fine--I suspect he has some variation in his blood pressure he did receive his Vicodin apparently an hour or 2 earlier-  This was discussed with Dr. Lyndel Safe via phone and will discontinue the 4:00  PM Vicodin dose- continue to monitor I did speak with him as well and stated that we may have to cut back a bit  on his pain medication and he expressed understanding- he has apparently tolerated this well and I suspect these blood pressure drops are not totally new-and well-tolerated- but will have to be watched  And again will update a CBC with differential and BMP tomorrow  [phone]- of note greater than 40 minutes spent assessing  patient-reassessing patient reviewing his chart and labs discussing status with nursing staff coordinating a plan of care- of note greater than 50% of time spent coordinating plan of care with input as noted above

## 2017-09-27 ENCOUNTER — Encounter (HOSPITAL_COMMUNITY)
Admission: RE | Admit: 2017-09-27 | Discharge: 2017-09-27 | Disposition: A | Discharge status: Home / Self Care | Payer: Medicare Other | Source: Skilled Nursing Facility | Attending: Internal Medicine | Admitting: Internal Medicine

## 2017-09-27 DIAGNOSIS — M1000 Idiopathic gout, unspecified site: Secondary | ICD-10-CM | POA: Diagnosis not present

## 2017-09-27 DIAGNOSIS — K4090 Unilateral inguinal hernia, without obstruction or gangrene, not specified as recurrent: Secondary | ICD-10-CM | POA: Diagnosis not present

## 2017-09-27 DIAGNOSIS — D6489 Other specified anemias: Secondary | ICD-10-CM | POA: Insufficient documentation

## 2017-09-27 DIAGNOSIS — I509 Heart failure, unspecified: Secondary | ICD-10-CM | POA: Insufficient documentation

## 2017-09-27 DIAGNOSIS — R05 Cough: Secondary | ICD-10-CM | POA: Diagnosis not present

## 2017-09-27 DIAGNOSIS — I259 Chronic ischemic heart disease, unspecified: Secondary | ICD-10-CM | POA: Diagnosis not present

## 2017-09-27 DIAGNOSIS — R339 Retention of urine, unspecified: Secondary | ICD-10-CM | POA: Insufficient documentation

## 2017-09-27 LAB — BASIC METABOLIC PANEL
ANION GAP: 8 (ref 5–15)
BUN: 16 mg/dL (ref 8–23)
CO2: 26 mmol/L (ref 22–32)
Calcium: 8.7 mg/dL — ABNORMAL LOW (ref 8.9–10.3)
Chloride: 102 mmol/L (ref 98–111)
Creatinine, Ser: 1.15 mg/dL (ref 0.61–1.24)
GFR calc Af Amer: 57 mL/min — ABNORMAL LOW (ref >60)
GFR calc non Af Amer: 50 mL/min — ABNORMAL LOW (ref >60)
Glucose, Bld: 99 mg/dL (ref 70–99)
POTASSIUM: 4.2 mmol/L (ref 3.5–5.1)
Sodium: 136 mmol/L (ref 135–145)

## 2017-09-27 LAB — CBC WITH DIFFERENTIAL/PLATELET
BASOS ABS: 0 10*3/uL (ref 0.0–0.1)
Basophils Relative: 0 %
EOS ABS: 0.4 10*3/uL (ref 0.0–0.7)
EOS PCT: 5 %
HCT: 33.3 % — ABNORMAL LOW (ref 39.0–52.0)
Hemoglobin: 10.3 g/dL — ABNORMAL LOW (ref 13.0–17.0)
LYMPHS PCT: 26 %
Lymphs Abs: 2 10*3/uL (ref 0.7–4.0)
MCH: 25.9 pg — ABNORMAL LOW (ref 26.0–34.0)
MCHC: 30.9 g/dL (ref 30.0–36.0)
MCV: 83.9 fL (ref 78.0–100.0)
Monocytes Absolute: 1.1 10*3/uL — ABNORMAL HIGH (ref 0.1–1.0)
Monocytes Relative: 14 %
Neutro Abs: 4.2 10*3/uL (ref 1.7–7.7)
Neutrophils Relative %: 55 %
Platelets: 157 10*3/uL (ref 150–400)
RBC: 3.97 MIL/uL — AB (ref 4.22–5.81)
RDW: 15.6 % — ABNORMAL HIGH (ref 11.5–15.5)
WBC: 7.7 10*3/uL (ref 4.0–10.5)

## 2017-09-28 ENCOUNTER — Encounter: Payer: Self-pay | Admitting: Internal Medicine

## 2017-09-28 ENCOUNTER — Non-Acute Institutional Stay (SKILLED_NURSING_FACILITY): Payer: Medicare Other | Admitting: Internal Medicine

## 2017-09-28 ENCOUNTER — Other Ambulatory Visit (HOSPITAL_COMMUNITY)
Admission: RE | Admit: 2017-09-28 | Discharge: 2017-09-28 | Disposition: A | Discharge status: Home / Self Care | Payer: Medicare Other | Source: Skilled Nursing Facility | Attending: Internal Medicine | Admitting: Internal Medicine

## 2017-09-28 DIAGNOSIS — I959 Hypotension, unspecified: Secondary | ICD-10-CM | POA: Diagnosis not present

## 2017-09-28 DIAGNOSIS — N401 Enlarged prostate with lower urinary tract symptoms: Secondary | ICD-10-CM

## 2017-09-28 DIAGNOSIS — R339 Retention of urine, unspecified: Secondary | ICD-10-CM | POA: Diagnosis not present

## 2017-09-28 NOTE — Progress Notes (Signed)
Location:    [place] Room Number: 139/D Place of Service:  SNF (31) Provider: [place] Miners Patrick [name]  Patient, [name]  Patient Care Team: Patient, [name] as PCP - General (General Practice) Patrick Binder, Patrick [name] as Consulting Physician (Gastroenterology) Patrick Dad, Patrick [name] as Consulting Physician [facility])  Extended Emergency Contact Information Primary Emergency Contact: Black,Shirley Address: [place], [place] [address] [name] of [place] Phone: [phone] Mobile Phone: [phone] Relation: Daughter Secondary Emergency Contact: [facility] Phone: [phone] Relation: Niece  Code Status:  DNR Goals of care: Advanced Directive information Advanced Directives 09/28/2017  Does Patient Have a Medical Advance Directive? Yes  Type of Advance Directive Out of facility DNR (pink MOST or yellow form)  Does patient want to make changes to medical advance directive? No - Patient declined  Copy of [place] in Chart? No - copy requested  Pre-existing out of facility DNR order (yellow form or pink MOST form) -     Chief Complaint  Patient presents with  . Acute Visit    Patient is being seen for hypotension and lethargy     HPI:  Pt is a 84 y.o. male seen today for an acute visit for Low BP and Lethargy  Patient Is Long term resident of facility and has h/o HTN, CHF, Depression, Arthritis, Anemia, GERD, Chronic Foley Cathter due to Urinary retention.PVD ABI in 2015 refused Aggressive approach, Aspiration Pneumonia, Dysphagia, Gout, paroxysmal Atrial Fibrillation not on any anticoagulation For last few weeks nurses have noticed that patient was sleeping more and his blood pressure also when he is sitting in wheelchair has been systolic of 90.  He has stayed afebrile.  His appetite stays good.  His weight is stable at 167 pounds.  He has not had any nausea vomiting abdominal pain.  He denies any  dizziness.   Patient did complain of some suprapubic pain today.  He also said his Foley catheter was bothering him.  Past Medical History:  Diagnosis Date  . Anxiety   . Benign prostatic hyperplasia   . Bilateral foot pain   . CHF (congestive heart failure) ([place])   . Depression   . Diverticulitis   . DJD (degenerative joint disease), cervical   . DNR (do not resuscitate)   . Esophageal dysmotility    age related per BPE 04/2015  . Esophageal stricture   . Falling   . GERD (gastroesophageal reflux disease)   . Gout   . Hiatal hernia   . History of recurrent TIAs   . HTN (hypertension)   . Hyperlipidemia   . Inguinal hernia   . Ischemic heart disease   . Kidney stone   . Osteoarthritis    bilat knees  . Pleural effusion   . Reflux   . Restless leg syndrome   . Right hip pain   . Skin cancer, basal cell   . Urinary retention    Past Surgical History:  Procedure Laterality Date  . ESOPHAGEAL DILATION    . EXPLORATORY LAPAROTOMY W/ BOWEL RESECTION    . FRACTURE SURGERY    . HEMIARTHROPLASTY HIP     Dr. Aline Brochure  . intestines      Allergies  Allergen Reactions  . Celebrex [Celecoxib] Other (See Comments)    Unknown; patient can't remember  . Codeine Other (See Comments)    nausea    Outpatient Encounter Medications as  of 09/28/2017  Medication Sig  . acetaminophen (TYLENOL) 325 MG tablet Take 650 mg by mouth every 4 (four) hours as needed.  Marland Kitchen allopurinol (ZYLOPRIM) 100 MG tablet Take 100 mg by mouth daily.  Marland Kitchen aspirin EC 81 MG tablet Take 81 mg by mouth daily.  . Cholecalciferol (VITAMIN D) 2000 UNITS CAPS Take 1 capsule by mouth daily.  Marland Kitchen docusate sodium (COLACE) 100 MG capsule Take 1 capsule (100 mg total) by mouth 2 (two) times daily. For constipation.  Marland Kitchen guaiFENesin (MUCINEX) 600 MG 12 hr tablet Take 600 mg 2 (two) times daily as needed by mouth for cough or to loosen phlegm.   Marland Kitchen HYDROcodone-acetaminophen (NORCO/VICODIN) 5-325 MG tablet Take 1 tablet by mouth  once daily and every 6 hours as needed for pain  . HYDROcodone-acetaminophen (NORCO/VICODIN) 5-325 MG tablet Take 1 tablet by mouth daily.  Marland Kitchen ipratropium-albuterol (DUONEB) 0.5-2.5 (3) MG/3ML SOLN Take 3 mLs every 6 (six) hours as needed by nebulization (for shortness of breath).  . magnesium hydroxide (MILK OF MAGNESIA) 400 MG/5ML suspension Take 30 mLs by mouth daily as needed for mild constipation. If no relief try a Fleets enema, after 2 days.  . Menthol, Topical Analgesic, (BIOFREEZE) 4 % GEL Apply small to back as needed for pain  . omeprazole (PRILOSEC) 20 MG capsule Take 20 mg by mouth daily.  . polyethylene glycol (MIRALAX / GLYCOLAX) packet Take 17 g by mouth daily.  Marland Kitchen Propylene Glycol 0.6 % SOLN Apply 1 drop to both eyes twice a day  . rOPINIRole (REQUIP) 2 MG tablet Take 2 mg by mouth at bedtime.  . sertraline (ZOLOFT) 25 MG tablet Take 37.5 mg by mouth at bedtime.  . [DISCONTINUED] HYDROcodone-acetaminophen (NORCO/VICODIN) 5-325 MG tablet Take 1 tablet by mouth every evening.   No facility-administered encounter medications on file as of 09/28/2017.      Review of Systems  Review of Systems  Constitutional: Negative for activity change, appetite change, chills, diaphoresis, fatigue and fever.  HENT: Negative for mouth sores, postnasal drip, rhinorrhea, sinus pain and sore throat.   Respiratory: Negative for apnea, cough, chest tightness, shortness of breath and wheezing.   Cardiovascular: Negative for chest pain, palpitations and leg swelling.  Gastrointestinal: Negative for abdominal distention, abdominal pain, constipation, diarrhea, nausea and vomiting.  Genitourinary: Negative for dysuria and frequency.  Musculoskeletal: Negative for arthralgias, joint swelling and myalgias.  Skin: Negative for rash.  Neurological: Negative for dizziness, syncope, weakness, light-headedness and numbness.  Psychiatric/Behavioral: Negative for behavioral problems, confusion and sleep  disturbance.     Immunization History  Administered Date(s) Administered  . Influenza,inj,Quad PF,6+ Mos 12/07/2014  . Influenza-Unspecified 11/20/2013, 11/13/2015, 11/18/2016  . PPD Test 03/16/2013  . Pneumococcal-Unspecified 02/14/2009, 11/25/2015  . Tdap 11/10/2016   Pertinent  Health Maintenance Due  Topic Date Due  . PNA vac Low Risk Adult (2 of 2 - PCV13) 10/12/2017 (Originally 11/24/2016)  . INFLUENZA VACCINE  10/27/2017 (Originally 09/14/2017)   Fall Risk  11/10/2016  Falls in the past year? No   Functional Status Survey:    Vitals:   09/28/17 1723  BP: 90/64 Checked Manually in Both Arms sitting in Wheelchair  Pulse: 62  Resp: 20  Temp: (!) 97.3 F (36.3 C)   There is no height or weight on file to calculate BMI. Physical Exam  Constitutional: He is oriented to person, place, and time. He appears well-developed and well-nourished.  HENT:  Head: Normocephalic.  Mouth/Throat: Oropharynx is clear and moist.  Eyes: Pupils  are equal, round, and reactive to light.  Neck: Neck supple.  Cardiovascular: Normal rate and regular rhythm.  No murmur heard. Pulmonary/Chest: Effort normal and breath sounds normal. No stridor. No respiratory distress. He has no wheezes.  Abdominal: Soft. Bowel sounds are normal. He exhibits no distension. There is no tenderness. There is no guarding.  Musculoskeletal: He exhibits no edema.  Trace edema Bilateral  Neurological: He is alert and oriented to person, place, and time.  No New Deficits  Skin: Skin is warm and dry.  Psychiatric: He has a normal mood and affect. His behavior is normal. Thought content normal.    Labs reviewed: Recent Labs    05/16/17 1255 08/03/17 0703 09/27/17 0700  NA 134* 134* 136  K 4.5 4.5 4.2  CL 100* 101 102  CO2 26 26 26   GLUCOSE 109* 106* 99  BUN 16 13 16   CREATININE 1.15 1.11 1.15  CALCIUM 8.6* 8.3* 8.7*   Recent Labs    01/02/17 1613 05/16/17 1255  AST 26 17  ALT 14* 10*  ALKPHOS 87 102   BILITOT 0.8 0.4  PROT 7.4 7.1  ALBUMIN 3.5 3.2*   Recent Labs    05/16/17 1255 08/03/17 0703 09/27/17 0700  WBC 8.6 7.3 7.7  NEUTROABS 5.1 4.4 4.2  HGB 10.1* 9.5* 10.3*  HCT 32.5* 30.0* 33.3*  MCV 85.1 85.2 83.9  PLT 186 167 157   Lab Results  Component Value Date   TSH 1.323 04/13/2016   Lab Results  Component Value Date   HGBA1C 6.1 (H) 11/12/2014   No results found for: CHOL, HDL, LDLCALC, LDLDIRECT, TRIG, CHOLHDL  Significant Diagnostic Results in last 30 days:  No results found.  Assessment/Plan Asymptomatic Hypotension with Lethargy His Labs done 2 days ago were normal with no Leucocytoisis Will Check Urine and Culture. Vitals Q Shift Discontinue Norco. Just use tylenol for Pain. Follow up . If patient gets symptomatic will consider Midodrine   Family/ staff Communication:   Labs/tests ordered:

## 2017-09-29 ENCOUNTER — Non-Acute Institutional Stay (SKILLED_NURSING_FACILITY): Payer: Medicare Other | Admitting: Internal Medicine

## 2017-09-29 ENCOUNTER — Encounter: Payer: Self-pay | Admitting: Internal Medicine

## 2017-09-29 DIAGNOSIS — M79604 Pain in right leg: Secondary | ICD-10-CM | POA: Diagnosis not present

## 2017-09-29 DIAGNOSIS — K625 Hemorrhage of anus and rectum: Secondary | ICD-10-CM

## 2017-09-29 DIAGNOSIS — I959 Hypotension, unspecified: Secondary | ICD-10-CM | POA: Diagnosis not present

## 2017-09-29 DIAGNOSIS — M79605 Pain in left leg: Secondary | ICD-10-CM

## 2017-09-29 NOTE — Progress Notes (Signed)
Location:    [place] Room Number: 139/D Place of Service:  SNF (31) Provider:  Granville Lewis  Patient, [name]  Patient Care Team: Patient, [name] as PCP - General (General Practice) Danie Binder, MD as Consulting Physician (Gastroenterology) [name], MD as Consulting Physician [facility])  Extended Emergency Contact Information Primary Emergency Contact: Black,Shirley Address: [address]          [place], [place] [address] [name] of [place] Phone: [phone] Mobile Phone: [phone] Relation: Daughter Secondary Emergency Contact: [facility] Phone: [phone] Relation: Niece  Code Status:  DNR Goals of care: Advanced Directive information Advanced Directives 09/29/2017  Does Patient Have a Medical Advance Directive? Yes  Type of Advance Directive Out of facility DNR (pink MOST or yellow form)  Does patient want to make changes to medical advance directive? No - Patient declined  Copy of [place] in Chart? No - copy requested  Pre-existing out of facility DNR order (yellow form or pink MOST form) -     Chief Complaint  Patient presents with  . Acute Visit    Patient is being seen for Hematochezia ( Blood in Stool)    HPI:  Pt is a 84 y.o. male seen today for an acute visit for an episode apparently of rectal bleeding earlier today.   He is a long-term resident of facility and has h/o HTN, CHF, Depression, Arthritis, Anemia, GERD, Chronic Foley Cathter due to Urinary retention.PVD ABI in 2015 refused Aggressive approach, Aspiration Pneumonia, Dysphagia, Gout, paroxysmal Atrial Fibrillation not on any anticoagulation other than low-dose aspirin  Currently this morning was noted to have rectal bleeding-apparently when he went to the toilet there was a significant amount of blood.  He is not really complaining of any abdominal pain or rectal discomfort and when I evaluated him later could not  really find appreciable blood in the rectal wall.  He does have an indwelling Foley catheter but could not appreciate any bleeding in that either.  I did not really note any hemorrhoids on exam  Currently vital signs are stable he was seen actually yesterday for sleeping episodes and hypotension which I noted earlier this week as well.  Blood pressure today actually appears to stable I got 98/48-previous reading was 101/52.  He is bright alert did not have any sleeping episodes when I saw him to day.      Past Medical History:  Diagnosis Date  . Anxiety   . Benign prostatic hyperplasia   . Bilateral foot pain   . CHF (congestive heart failure) ([place])   . Depression   . Diverticulitis   . DJD (degenerative joint disease), cervical   . DNR (do not resuscitate)   . Esophageal dysmotility    age related per BPE 04/2015  . Esophageal stricture   . Falling   . GERD (gastroesophageal reflux disease)   . Gout   . Hiatal hernia   . History of recurrent TIAs   . HTN (hypertension)   . Hyperlipidemia   . Inguinal hernia   . Ischemic heart disease   . Kidney stone   . Osteoarthritis    bilat knees  . Pleural effusion   . Reflux   . Restless leg syndrome   . Right hip pain   . Skin cancer, basal cell   . Urinary retention    Past Surgical History:  Procedure Laterality Date  . ESOPHAGEAL DILATION    .  EXPLORATORY LAPAROTOMY W/ BOWEL RESECTION    . FRACTURE SURGERY    . HEMIARTHROPLASTY HIP     Dr. Aline Brochure  . intestines      Allergies  Allergen Reactions  . Celebrex [Celecoxib] Other (See Comments)    Unknown; patient can't remember  . Codeine Other (See Comments)    nausea    Outpatient Encounter Medications as of 09/29/2017  Medication Sig  . acetaminophen (TYLENOL) 325 MG tablet Take 650 mg by mouth every 4 (four) hours as needed.  Marland Kitchen allopurinol (ZYLOPRIM) 100 MG tablet Take 100 mg by mouth daily.  Marland Kitchen aspirin EC 81 MG tablet Take 81 mg by mouth daily.  .  Cholecalciferol (VITAMIN D) 2000 UNITS CAPS Take 1 capsule by mouth daily.  Marland Kitchen docusate sodium (COLACE) 100 MG capsule Take 1 capsule (100 mg total) by mouth 2 (two) times daily. For constipation.  Marland Kitchen guaiFENesin (MUCINEX) 600 MG 12 hr tablet Take 600 mg 2 (two) times daily as needed by mouth for cough or to loosen phlegm.   Marland Kitchen HYDROcodone-acetaminophen (NORCO/VICODIN) 5-325 MG tablet Take 1 tablet by mouth daily.  Marland Kitchen ipratropium-albuterol (DUONEB) 0.5-2.5 (3) MG/3ML SOLN Take 3 mLs every 6 (six) hours as needed by nebulization (for shortness of breath).  . magnesium hydroxide (MILK OF MAGNESIA) 400 MG/5ML suspension Take 30 mLs by mouth daily as needed for mild constipation. If no relief try a Fleets enema, after 2 days.  . Menthol, Topical Analgesic, (BIOFREEZE) 4 % GEL Apply small to back as needed for pain  . omeprazole (PRILOSEC) 20 MG capsule Take 20 mg by mouth daily.  . polyethylene glycol (MIRALAX / GLYCOLAX) packet Take 17 g by mouth daily.  Marland Kitchen Propylene Glycol 0.6 % SOLN Apply 1 drop to both eyes twice a day  . rOPINIRole (REQUIP) 2 MG tablet Take 2 mg by mouth at bedtime.  . sertraline (ZOLOFT) 25 MG tablet Take 37.5 mg by mouth at bedtime.  . [DISCONTINUED] HYDROcodone-acetaminophen (NORCO/VICODIN) 5-325 MG tablet Take 1 tablet by mouth once daily and every 6 hours as needed for pain   No facility-administered encounter medications on file as of 09/29/2017.     Review of Systems  Immunization History  Administered Date(s) Administered  . Influenza,inj,Quad PF,6+ Mos 12/07/2014  . Influenza-Unspecified 11/20/2013, 11/13/2015, 11/18/2016  . PPD Test 03/16/2013  . Pneumococcal-Unspecified 02/14/2009, 11/25/2015  . Tdap 11/10/2016   Pertinent  Health Maintenance Due  Topic Date Due  . PNA vac Low Risk Adult (2 of 2 - PCV13) 10/12/2017 (Originally 11/24/2016)  . INFLUENZA VACCINE  10/27/2017 (Originally 09/14/2017)   Fall Risk  11/10/2016  Falls in the past year? No   Functional  Status Survey:    Vitals:   09/29/17 1213  BP: (!) 101/52  Pulse: 67  Resp: 20  Temp: (!) 97 F (36.1 C)  TempSrc: Oral   There is no height or weight on file to calculate BMI. Physical Exam  Labs reviewed: Recent Labs    05/16/17 1255 08/03/17 0703 09/27/17 0700  NA 134* 134* 136  K 4.5 4.5 4.2  CL 100* 101 102  CO2 26 26 26   GLUCOSE 109* 106* 99  BUN 16 13 16   CREATININE 1.15 1.11 1.15  CALCIUM 8.6* 8.3* 8.7*   Recent Labs    01/02/17 1613 05/16/17 1255  AST 26 17  ALT 14* 10*  ALKPHOS 87 102  BILITOT 0.8 0.4  PROT 7.4 7.1  ALBUMIN 3.5 3.2*   Recent Labs  05/16/17 1255 08/03/17 0703 09/27/17 0700  WBC 8.6 7.3 7.7  NEUTROABS 5.1 4.4 4.2  HGB 10.1* 9.5* 10.3*  HCT 32.5* 30.0* 33.3*  MCV 85.1 85.2 83.9  PLT 186 167 157   Lab Results  Component Value Date   TSH 1.323 04/13/2016   Lab Results  Component Value Date   HGBA1C 6.1 (H) 11/12/2014   No results found for: CHOL, HDL, LDLCALC, LDLDIRECT, TRIG, CHOLHDL  Significant Diagnostic Results in last 30 days:  No results found.  Assessment/Plan There are no diagnoses linked to this encounter.      Oralia Manis, [place] [phone]  This encounter was created in error - please disregard.

## 2017-09-29 NOTE — Progress Notes (Signed)
This encounter was created in error - please disregard.

## 2017-09-29 NOTE — Progress Notes (Signed)
Location:    [place] Room Number: 139/D Place of Service:  SNF (31) Provider:  Granville Lewis  Patient, [name]  Patient Care Team: Patient, [name] as PCP - General (General Practice) Patrick Binder, MD as Consulting Physician (Gastroenterology) Patrick Dad, MD as Consulting Physician [facility])  Extended Emergency Contact Information Primary Emergency Contact: [name],Patrick Address: [address]          [place], [place] [address] [name] of [place] Phone: [phone] Mobile Phone: [phone] Relation: Daughter Secondary Emergency Contact: [facility] Phone: [phone] Relation: Niece  Code Status: DNR Goals of care: Advanced Directive information Advanced Directives 09/29/2017  Does Patient Have a Medical Advance Directive? Yes  Type of Advance Directive Out of facility DNR (pink MOST or yellow form)  Does patient want to make changes to medical advance directive? No - Patient declined  Copy of [place] in Chart? No - copy requested  Pre-existing out of facility DNR order (yellow form or pink MOST form) -     Chief Complaint  Patient presents with  . Acute Visit    Patient is being seen for hematochezia ( Blood in Stool)    HPI:  Pt is a 84 y.o. male seen today for an acute visit for apparent episode of rectal bleeding earlier today.  Apparently he was using the he had a bowel movement with a significant amount of blood-.  There has been no recurrence she is not complaining of any abdominal pain or discomfort actually had a good appetite for lunch today.  Patient Is Long term resident of facility and has h/o HTN, CHF, Depression, Arthritis, Anemia, GERD, Chronic Foley Cathter due to Urinary retention.PVD ABI in 2015 refused Aggressive approach, Aspiration Pneumonia, Dysphagia, Gout, paroxysmal Atrial Fibrillation not on any anticoagulation--other than low-dose aspirin.  Actually he has been seen  earlier this week for some sleeping episodes and hypotension- but he is bright and alert today appears to be in good spirits- blood pressure actually has shown some improvement it was listed as 102/52 got a fairly comparable reading at 98/48 manually  Urine culture is pending for possibility possibly some of this sleeping may be caused by urinary tract infection.  His Norco has been discontinued and recommendation to use Tylenol for pain at this point.  In regards to the rectal bleeding this appears to be a new diagnosis-he does have an indwelling Foley catheter but I do not note any blood in the catheter.  I do not see a significant history of hemorrhoids and could not really appreciate any on exam today.  Fact when I did the rectal exam later today I could not really appreciate any bleeding.  Again currently he appears to be doing well   Past Medical History:  Diagnosis Date  . Anxiety   . Benign prostatic hyperplasia   . Bilateral foot pain   . CHF (congestive heart failure) ([place])   . Depression   . Diverticulitis   . DJD (degenerative joint disease), cervical   . DNR (do not resuscitate)   . Esophageal dysmotility    age related per BPE 04/2015  . Esophageal stricture   . Falling   . GERD (gastroesophageal reflux disease)   . Gout   . Hiatal hernia   . History of recurrent TIAs   . HTN (hypertension)   . Hyperlipidemia   . Inguinal hernia   . Ischemic heart disease   . Kidney stone   .  Osteoarthritis    bilat knees  . Pleural effusion   . Reflux   . Restless leg syndrome   . Right hip pain   . Skin cancer, basal cell   . Urinary retention    Past Surgical History:  Procedure Laterality Date  . ESOPHAGEAL DILATION    . EXPLORATORY LAPAROTOMY W/ BOWEL RESECTION    . FRACTURE SURGERY    . HEMIARTHROPLASTY HIP     Dr. Aline Brochure  . intestines      Allergies  Allergen Reactions  . Celebrex [Celecoxib] Other (See Comments)    Unknown; patient can't remember  .  Codeine Other (See Comments)    nausea    Outpatient Encounter Medications as of 09/29/2017  Medication Sig  . acetaminophen (TYLENOL) 325 MG tablet Take 650 mg by mouth every 4 (four) hours as needed.  Marland Kitchen allopurinol (ZYLOPRIM) 100 MG tablet Take 100 mg by mouth daily.  Marland Kitchen aspirin EC 81 MG tablet Take 81 mg by mouth daily.  . Cholecalciferol (VITAMIN D) 2000 UNITS CAPS Take 1 capsule by mouth daily.  Marland Kitchen docusate sodium (COLACE) 100 MG capsule Take 1 capsule (100 mg total) by mouth 2 (two) times daily. For constipation.  Marland Kitchen guaiFENesin (MUCINEX) 600 MG 12 hr tablet Take 600 mg 2 (two) times daily as needed by mouth for cough or to loosen phlegm.   Marland Kitchen HYDROcodone-acetaminophen (NORCO/VICODIN) 5-325 MG tablet Take 1 tablet by mouth daily.  Marland Kitchen ipratropium-albuterol (DUONEB) 0.5-2.5 (3) MG/3ML SOLN Take 3 mLs every 6 (six) hours as needed by nebulization (for shortness of breath).  . magnesium hydroxide (MILK OF MAGNESIA) 400 MG/5ML suspension Take 30 mLs by mouth daily as needed for mild constipation. If no relief try a Fleets enema, after 2 days.  . Menthol, Topical Analgesic, (BIOFREEZE) 4 % GEL Apply small to back as needed for pain  . omeprazole (PRILOSEC) 20 MG capsule Take 20 mg by mouth daily.  . polyethylene glycol (MIRALAX / GLYCOLAX) packet Take 17 g by mouth daily.  Marland Kitchen Propylene Glycol 0.6 % SOLN Apply 1 drop to both eyes twice a day  . rOPINIRole (REQUIP) 2 MG tablet Take 2 mg by mouth at bedtime.  . sertraline (ZOLOFT) 25 MG tablet Take 37.5 mg by mouth at bedtime.   No facility-administered encounter medications on file as of 09/29/2017.     Review of Systems   In general he says he feels well today is not complaining of any fever chills  Skin does not complain of rashes or itching  Head ears eyes nose mouth and throat not complain of visual changes from baseline he would like to see optometrist and this is being arranged  Respiratory does not complain of being short of breath or  having a cough.   Cardiac is not complaining of chest pain has some mild lower extremity edema   GI is not complaining of any abdominal pain nausea vomiting diarrhea or constipation he does have a large abdominal hernia at baseline  GU does not complain of dysuria has an indwelling Foley catheter with history of urinary retention   Rectal-does not complain of any rectal pain   Musculoskeletal at times will complain of joint pain today is complaining more of kind of leg pain  Neurologic does not complain of dizziness or headache or syncope does say he feels he is having restless leg during the day he is on Requip at night for this and says that does help  Psych continues to be  in good spirits- does not complain of being depressed or anxious  Immunization History  Administered Date(s) Administered  . Influenza,inj,Quad PF,6+ Mos 12/07/2014  . Influenza-Unspecified 11/20/2013, 11/13/2015, 11/18/2016  . PPD Test 03/16/2013  . Pneumococcal-Unspecified 02/14/2009, 11/25/2015  . Tdap 11/10/2016   Pertinent  Health Maintenance Due  Topic Date Due  . PNA vac Low Risk Adult (2 of 2 - PCV13) 10/12/2017 (Originally 11/24/2016)  . INFLUENZA VACCINE  10/27/2017 (Originally 09/14/2017)   Fall Risk  11/10/2016  Falls in the past year? No   Functional Status Survey:    Vitals:   09/29/17 1514  BP: (!) 101/52  Pulse: 67  Resp: 20  Temp: (!) 97 F (36.1 C)  TempSrc: Oral  Blood pressure was 98/48 on manual reading Physical Exam  In general this is a pleasant elderly male in no distress sitting comfortably in his wheelchair.  His skin is warm and dry.  Eyes visual acuity appears grossly intact he has prescription lenses-.  Oropharynx is clear mucous membranes moist.  Chest is clear to auscultation there is no labored breathing.  Heart is largely regular rate and rhythm without murmur gallop or rub he has trace lower extremity edema.  Abdomen he does have a large abdominal  hernia at baseline it is soft nontender with positive bowel sounds.  GU has an indwelling Foley catheter I do not see any blood in the bag.  Musculoskeletal does have general frailty but moves all extremities at baseline with some stiffness of his lower extremities which is baseline.  Neurologic is grossly intact no lateralizing.findings  Psych he is largely alert and oriented pleasant and appropriate at baseline  Later in the day I was able to reevaluate patient when I saw him  In  bed- I could not appreciate any bleeding on rectal exam   Labs reviewed: Recent Labs    05/16/17 1255 08/03/17 0703 09/27/17 0700  NA 134* 134* 136  K 4.5 4.5 4.2  CL 100* 101 102  CO2 26 26 26   GLUCOSE 109* 106* 99  BUN 16 13 16   CREATININE 1.15 1.11 1.15  CALCIUM 8.6* 8.3* 8.7*   Recent Labs    01/02/17 1613 05/16/17 1255  AST 26 17  ALT 14* 10*  ALKPHOS 87 102  BILITOT 0.8 0.4  PROT 7.4 7.1  ALBUMIN 3.5 3.2*   Recent Labs    05/16/17 1255 08/03/17 0703 09/27/17 0700  WBC 8.6 7.3 7.7  NEUTROABS 5.1 4.4 4.2  HGB 10.1* 9.5* 10.3*  HCT 32.5* 30.0* 33.3*  MCV 85.1 85.2 83.9  PLT 186 167 157   Lab Results  Component Value Date   TSH 1.323 04/13/2016   Lab Results  Component Value Date   HGBA1C 6.1 (H) 11/12/2014   No results found for: CHOL, HDL, LDLCALC, LDLDIRECT, TRIG, CHOLHDL  Significant Diagnostic Results in last 30 days:  No results found.  Assessment/Plan  #1-rectal bleeding- it appears as has subsided per nursing was a a noteable amount of blood when he went to the bathroom- could not rule out an internal hemorrhoid-but clinically he appears stable and no evidence of bleeding at this time.  We will hold the aspirin for now- and also will start him on a proton pump inhibitor.  Update a CBC tomorrow again his hemoglobin has shown stability--it was 10.3 on lab done just 2 days ago  Also will guaiac stools x3  2-- pain complaints- he still has an order for  routine Vicodin in the morning-appears  to have tolerated this continues on Tylenol as needed later in the day-at this point will monitor- He does have an order for Requip at night that would be hesitant to do this in the morning secondary to sedation concerns  #3-hypotension-again he appears to have stabilized as noted above- clinically appears to be doing well right alert he did not fall asleep during my exams today.--Urine culture is pending  [id number]

## 2017-09-30 ENCOUNTER — Encounter (HOSPITAL_COMMUNITY)
Admission: RE | Admit: 2017-09-30 | Discharge: 2017-09-30 | Disposition: A | Discharge status: Home / Self Care | Payer: Medicare Other | Source: Skilled Nursing Facility

## 2017-09-30 ENCOUNTER — Encounter (HOSPITAL_COMMUNITY)
Admission: RE | Admit: 2017-09-30 | Discharge: 2017-09-30 | Disposition: A | Discharge status: Home / Self Care | Payer: Medicare Other | Source: Skilled Nursing Facility | Attending: *Deleted | Admitting: *Deleted

## 2017-09-30 DIAGNOSIS — I509 Heart failure, unspecified: Secondary | ICD-10-CM | POA: Diagnosis not present

## 2017-09-30 DIAGNOSIS — R339 Retention of urine, unspecified: Secondary | ICD-10-CM | POA: Diagnosis not present

## 2017-09-30 DIAGNOSIS — M1000 Idiopathic gout, unspecified site: Secondary | ICD-10-CM | POA: Diagnosis not present

## 2017-09-30 DIAGNOSIS — I259 Chronic ischemic heart disease, unspecified: Secondary | ICD-10-CM | POA: Diagnosis not present

## 2017-09-30 DIAGNOSIS — D6489 Other specified anemias: Secondary | ICD-10-CM | POA: Diagnosis not present

## 2017-09-30 DIAGNOSIS — R05 Cough: Secondary | ICD-10-CM | POA: Diagnosis not present

## 2017-09-30 LAB — CBC WITH DIFFERENTIAL/PLATELET
BASOS ABS: 0 10*3/uL (ref 0.0–0.1)
Basophils Relative: 0 %
EOS PCT: 3 %
Eosinophils Absolute: 0.3 10*3/uL (ref 0.0–0.7)
HCT: 33.6 % — ABNORMAL LOW (ref 39.0–52.0)
Hemoglobin: 10.6 g/dL — ABNORMAL LOW (ref 13.0–17.0)
LYMPHS ABS: 1.8 10*3/uL (ref 0.7–4.0)
Lymphocytes Relative: 21 %
MCH: 26.4 pg (ref 26.0–34.0)
MCHC: 31.5 g/dL (ref 30.0–36.0)
MCV: 83.8 fL (ref 78.0–100.0)
Monocytes Absolute: 1.4 10*3/uL — ABNORMAL HIGH (ref 0.1–1.0)
Monocytes Relative: 16 %
Neutro Abs: 5.1 10*3/uL (ref 1.7–7.7)
Neutrophils Relative %: 60 %
PLATELETS: 180 10*3/uL (ref 150–400)
RBC: 4.01 MIL/uL — AB (ref 4.22–5.81)
RDW: 15.8 % — ABNORMAL HIGH (ref 11.5–15.5)
WBC: 8.6 10*3/uL (ref 4.0–10.5)

## 2017-09-30 LAB — BASIC METABOLIC PANEL
Anion gap: 9 (ref 5–15)
BUN: 14 mg/dL (ref 8–23)
CHLORIDE: 103 mmol/L (ref 98–111)
CO2: 24 mmol/L (ref 22–32)
Calcium: 8.6 mg/dL — ABNORMAL LOW (ref 8.9–10.3)
Creatinine, Ser: 1.04 mg/dL (ref 0.61–1.24)
GFR, EST NON AFRICAN AMERICAN: 56 mL/min — AB (ref >60)
Glucose, Bld: 98 mg/dL (ref 70–99)
Potassium: 3.9 mmol/L (ref 3.5–5.1)
SODIUM: 136 mmol/L (ref 135–145)

## 2017-09-30 LAB — URINE CULTURE

## 2017-09-30 LAB — URINALYSIS, ROUTINE W REFLEX MICROSCOPIC
BILIRUBIN URINE: NEGATIVE
GLUCOSE, UA: NEGATIVE mg/dL
Ketones, ur: NEGATIVE mg/dL
Nitrite: POSITIVE — AB
PH: 8 (ref 5.0–8.0)
Protein, ur: 100 mg/dL — AB
Specific Gravity, Urine: 1.019 (ref 1.005–1.030)
WBC, UA: >50 WBC/hpf — ABNORMAL HIGH (ref 0–5)

## 2017-09-30 LAB — OCCULT BLOOD X 1 CARD TO LAB, STOOL: FECAL OCCULT BLD: POSITIVE — AB

## 2017-10-01 ENCOUNTER — Encounter (HOSPITAL_COMMUNITY)
Admission: RE | Admit: 2017-10-01 | Discharge: 2017-10-01 | Disposition: A | Discharge status: Home / Self Care | Payer: Medicare Other | Source: Skilled Nursing Facility | Attending: *Deleted | Admitting: *Deleted

## 2017-10-01 DIAGNOSIS — R05 Cough: Secondary | ICD-10-CM | POA: Diagnosis not present

## 2017-10-01 DIAGNOSIS — M1000 Idiopathic gout, unspecified site: Secondary | ICD-10-CM | POA: Diagnosis not present

## 2017-10-01 DIAGNOSIS — I509 Heart failure, unspecified: Secondary | ICD-10-CM | POA: Diagnosis not present

## 2017-10-01 DIAGNOSIS — I259 Chronic ischemic heart disease, unspecified: Secondary | ICD-10-CM | POA: Diagnosis not present

## 2017-10-01 DIAGNOSIS — R339 Retention of urine, unspecified: Secondary | ICD-10-CM | POA: Diagnosis not present

## 2017-10-01 DIAGNOSIS — D6489 Other specified anemias: Secondary | ICD-10-CM | POA: Diagnosis not present

## 2017-10-01 LAB — OCCULT BLOOD X 1 CARD TO LAB, STOOL: FECAL OCCULT BLD: POSITIVE — AB

## 2017-10-03 ENCOUNTER — Non-Acute Institutional Stay (SKILLED_NURSING_FACILITY): Payer: Medicare Other | Admitting: Internal Medicine

## 2017-10-03 ENCOUNTER — Encounter: Payer: Self-pay | Admitting: Internal Medicine

## 2017-10-03 DIAGNOSIS — N390 Urinary tract infection, site not specified: Secondary | ICD-10-CM

## 2017-10-03 DIAGNOSIS — K625 Hemorrhage of anus and rectum: Secondary | ICD-10-CM | POA: Diagnosis not present

## 2017-10-03 DIAGNOSIS — R52 Pain, unspecified: Secondary | ICD-10-CM | POA: Diagnosis not present

## 2017-10-03 DIAGNOSIS — I959 Hypotension, unspecified: Secondary | ICD-10-CM

## 2017-10-03 NOTE — Progress Notes (Signed)
Location:   [facility] Room Number: [phone] Place of Service:  SNF (31) Provider:  [place] Lewis  Patient, [name]  Patient Care Team: Patient, [name] as PCP - General (General Practice) Danie Binder, MD as Consulting Physician (Gastroenterology) [name], MD as Consulting Physician [facility])  Extended Emergency Contact Information Primary Emergency Contact: Black,Shirley Address: [address]          [place], [place] [address] [name] of [place] Phone: [phone] Mobile Phone: [phone] Relation: Daughter Secondary Emergency Contact: [facility] Phone: [phone] Relation: Niece  Code Status:  DNR Goals of care: Advanced Directive information Advanced Directives 09/29/2017  Does Patient Have a Medical Advance Directive? Yes  Type of Advance Directive Out of facility DNR (pink MOST or yellow form)  Does patient want to make changes to medical advance directive? No - Patient declined  Copy of [place] in Chart? No - copy requested  Pre-existing out of facility DNR order (yellow form or pink MOST form) -    Chief complaint-acute visit secondary UTI- also follow-up of rectal bleeding  HPI:  Pt is a 84 y.o. male seen today for an acute visit for UTI as well as follow-up of rectal bleeding.  Patient Is Long term resident of facility and has h/o HTN, CHF, Depression, Arthritis, Anemia, GERD, Chronic Foley Cathter due to Urinary retention.PVD ABI in 2015 refused Aggressive approach, Aspiration Pneumonia, Dysphagia, Gout, paroxysmal Atrial Fibrillation  was not on any anticoagulation--other than low-dose aspirin.  Which has since been discontinued because of rectal bleeding   Initially he was seen early last week for some sleeping episodes and hypotension- and a urine culture was obtained which has grown out greater than 100,000 colonies of Proteus mirabilis and 60,000 colonies of E. coli.  He is not overtly  symptomatic he does likely have a chronic indwelling Foley catheter because of urinary retention.  It was noted later last week that he had some blood in the stool- when I assessed him I could not really appreciate any bleeding but he has come back with 2 occult positive blood tests stool.  Hemoglobin over this weekend however she did show stability at 10.6- we did increase the strength of his proton pump inhibitor and again discontinued the aspirin.  I do note per chart review back in 2011 he did have diverticulitis with perforation and required a laparotomy and sigmoid resection  He currently is not complaining of any abdominal pain vital signs are stable blood pressure still runs somewhat low at times but he is asymptomatic I got 108/56 manually today.  In regards to the rectal bleeding I did discuss this with him and laid out alternatives including a GI consult and most likely a colonoscopy- he stated at this point he is not really interested in any aggressive work-up- would just like to keep things as they are--I did discuss this could be possibly from an internal hemorrhoid or diverticulitis or possibly cancer he expressed understanding- but did express desire for no aggressive intervention .  I also discussed this with his daughter Mort Sawyers via phone- who expressed agreement  Currently again vital signs are stable he is sitting in his wheelchair comfortably watching TV news which he frequently does   Past Medical History:  Diagnosis Date  . Anxiety   . Benign prostatic hyperplasia   . Bilateral foot pain   . CHF (congestive heart failure) ([place])   . Depression   .  Diverticulitis   . DJD (degenerative joint disease), cervical   . DNR (do not resuscitate)   . Esophageal dysmotility    age related per BPE 04/2015  . Esophageal stricture   . Falling   . GERD (gastroesophageal reflux disease)   . Gout   . Hiatal hernia   . History of recurrent TIAs   . HTN (hypertension)   .  Hyperlipidemia   . Inguinal hernia   . Ischemic heart disease   . Kidney stone   . Osteoarthritis    bilat knees  . Pleural effusion   . Reflux   . Restless leg syndrome   . Right hip pain   . Skin cancer, basal cell   . Urinary retention    Past Surgical History:  Procedure Laterality Date  . ESOPHAGEAL DILATION    . EXPLORATORY LAPAROTOMY W/ BOWEL RESECTION    . FRACTURE SURGERY    . HEMIARTHROPLASTY HIP     Dr. Aline Brochure  . intestines      Allergies  Allergen Reactions  . Celebrex [Celecoxib] Other (See Comments)    Unknown; patient can't remember  . Codeine Other (See Comments)    nausea    Outpatient Encounter Medications as of 10/03/2017  Medication Sig  . acetaminophen (TYLENOL) 325 MG tablet Take 650 mg by mouth every 4 (four) hours as needed.  Marland Kitchen allopurinol (ZYLOPRIM) 100 MG tablet Take 100 mg by mouth daily.  . Cholecalciferol (VITAMIN D) 2000 UNITS CAPS Take 1 capsule by mouth daily.  Marland Kitchen docusate sodium (COLACE) 100 MG capsule Take 1 capsule (100 mg total) by mouth 2 (two) times daily. For constipation.  Marland Kitchen guaiFENesin (MUCINEX) 600 MG 12 hr tablet Take 600 mg 2 (two) times daily as needed by mouth for cough or to loosen phlegm.   Marland Kitchen HYDROcodone-acetaminophen (NORCO/VICODIN) 5-325 MG tablet Take 1 tablet by mouth daily.  Marland Kitchen ipratropium-albuterol (DUONEB) 0.5-2.5 (3) MG/3ML SOLN Take 3 mLs every 6 (six) hours as needed by nebulization (for shortness of breath).  . magnesium hydroxide (MILK OF MAGNESIA) 400 MG/5ML suspension Take 30 mLs by mouth daily as needed for mild constipation. If no relief try a Fleets enema, after 2 days.  . Menthol, Topical Analgesic, (BIOFREEZE) 4 % GEL Apply small to back as needed for pain  . omeprazole (PRILOSEC) 20 MG capsule Take 40 mg by mouth daily.   . polyethylene glycol (MIRALAX / GLYCOLAX) packet Take 17 g by mouth daily.  Marland Kitchen Propylene Glycol 0.6 % SOLN Apply 1 drop to both eyes twice a day  . rOPINIRole (REQUIP) 2 MG tablet Take 2  mg by mouth at bedtime.  . sertraline (ZOLOFT) 25 MG tablet Take 37.5 mg by mouth at bedtime.  Marland Kitchen aspirin EC 81 MG tablet Take 81 mg by mouth daily.   No facility-administered encounter medications on file as of 10/03/2017.     Review of Systems   In general is not complaining of any fever chills says he feels he is doing well.  Skin is not complain of rashes or itching does have numerous skin cancer removal sites.  Head ears eyes nose mouth and throat no complaints of visual changes from baseline he does have prescription lenses does not complain of any difficulty swallowing does have a history of dysphasia.  Respiratory is not complaining of shortness of breath or cough.  Cardiac does not complain of chest pain has fairly mild lower extremity edema quite minimal.  GI does not complain of abdominal pain  does have a large abdominal hernia right side which is baseline.  Does not complain of diarrhea or constipation.    GU does have an indwelling Foley catheter is not really complaining of burning with urination today.  Musculoskeletal does have diffuse osteoarthritic complaints but is not really complaining of pain toda Of note last week his Norco was discontinued except for the morning dose secondary to concerns this may be causing some hypotension- he does continue on Tylenol as needed  Neurologic is not complain of dizziness syncope or headache.  Psych does not complain of being depressed or anxious continues to be in his usual good spirits-- Immunization History  Administered Date(s) Administered  . Influenza,inj,Quad PF,6+ Mos 12/07/2014  . Influenza-Unspecified 11/20/2013, 11/13/2015, 11/18/2016  . PPD Test 03/16/2013  . Pneumococcal-Unspecified 02/14/2009, 11/25/2015  . Tdap 11/10/2016   Pertinent  Health Maintenance Due  Topic Date Due  . PNA vac Low Risk Adult (2 of 2 - PCV13) 10/12/2017 (Originally 11/24/2016)  . INFLUENZA VACCINE  10/27/2017 (Originally 09/14/2017)     Fall Risk  11/10/2016  Falls in the past year? No   Functional Status Survey:      Physical Exam  Wt: 167.6lb, BP: 91/59, P:72, T: 97.8, RR: 20, SpO2: 96% HT: 6' Of note manual blood pressure was 108/56  In general this is a very pleasant elderly male in no distress sitting comfortably in his wheelchair.  His skin is warm and dry he does have numerous areas where skin cancer is been removed he is followed by dermatology.  Eyes visual acuity appears to be at baseline he has prescription lenses.  Oropharynx is clear mucous membranes moist.  Chest continues to be clear to auscultation there is no labored breathing somewhat shallow air entry.  Heart is regular rate and rhythm without murmur gallop or rub he has minimal lower extremity edema.  Abdomen continues with the abdominal hernia on the right side it is soft nontender with active bowel sounds.  GU he has an indwelling Foley catheter could not really appreciate any suprapubic tenderness-catheter bag appears to have just been emptied.  I do not note any blood in the area.  Musculoskeletal continues with diffuse arthritic changes but is ambulatory in wheelchair and does well with this--strength appears to be at baseline all 4 extremities.  Neurologic is grossly intact his speech is clear no lateralizing findings.  Psych he is largely alert and oriented very pleasant and appropriate  Labs reviewed: Recent Labs    08/03/17 0703 09/27/17 0700 09/30/17 0830  NA 134* 136 136  K 4.5 4.2 3.9  CL 101 102 103  CO2 26 26 24   GLUCOSE 106* 99 98  BUN 13 16 14   CREATININE 1.11 1.15 1.04  CALCIUM 8.3* 8.7* 8.6*   Recent Labs    01/02/17 1613 05/16/17 1255  AST 26 17  ALT 14* 10*  ALKPHOS 87 102  BILITOT 0.8 0.4  PROT 7.4 7.1  ALBUMIN 3.5 3.2*   Recent Labs    08/03/17 0703 09/27/17 0700 09/30/17 0830  WBC 7.3 7.7 8.6  NEUTROABS 4.4 4.2 5.1  HGB 9.5* 10.3* 10.6*  HCT 30.0* 33.3* 33.6*  MCV 85.2 83.9 83.8   PLT 167 157 180   Lab Results  Component Value Date   TSH 1.323 04/13/2016   Lab Results  Component Value Date   HGBA1C 6.1 (H) 11/12/2014   No results found for: CHOL, HDL, LDLCALC, LDLDIRECT, TRIG, CHOLHDL  Significant Diagnostic Results in last 30 days:  No results found.  Assessment/Plan  #1 UTI positive for Proteus mirabilis greater than 100,000 colonies and 60,000 colonies of E. coli- sensitivities are pending- will empirically start Keflex -Based on renal function will start at 500 mg twice daily for 5 days and monitor- also will add a probiotic twice daily for 7 days.  2.  History of rectal bleeding- apparently this was visible last week and now has had 2+ occult blood test from the stool- hemoglobin has shown stability will recheck that later this week to ensure it remains stable.  Clinically he appears to be doing well- his aspirin has been discontinued his proton pump inhibitor has been increased- as noted above I did discuss options with him and he would like no aggressive intervention-I also discussed this with his daughter as noted above who is in agreement.  At this point will monitor clinically and do update lab later this week.  3.  History of hypotension he continues at times have some low blood pressures but appears to be tolerating this quite well- he is not on any antihypertensives- at one point had been on Lasix but is no longer on this.  Clinically appears stable at this point will monitor manual reading again was 108/56 today-he does not complain of any dizziness or syncope.  His Norco has been discontinued except for a.m. dose-he does have Tylenol for pain.  Regards to diffuse osteoarthritic pain is not really complaining of left today but this will have to be watched I do note he also has Biofreeze topical if needed   [id number]-of note greater than 35 minutes spent assessing patient- reviewing his chart and labs-discussing his status at bedside-as well as  via phone with his family.  Of note greater than 50% of time spent coordinating a plan of care for numerous diagnoses with input as noted above.

## 2017-10-04 LAB — URINE CULTURE

## 2017-10-05 ENCOUNTER — Encounter (HOSPITAL_COMMUNITY)
Admission: RE | Admit: 2017-10-05 | Discharge: 2017-10-05 | Disposition: A | Discharge status: Home / Self Care | Payer: Medicare Other | Source: Skilled Nursing Facility | Attending: Internal Medicine | Admitting: Internal Medicine

## 2017-10-05 DIAGNOSIS — I259 Chronic ischemic heart disease, unspecified: Secondary | ICD-10-CM | POA: Diagnosis not present

## 2017-10-05 DIAGNOSIS — R05 Cough: Secondary | ICD-10-CM | POA: Insufficient documentation

## 2017-10-05 DIAGNOSIS — N390 Urinary tract infection, site not specified: Secondary | ICD-10-CM | POA: Diagnosis not present

## 2017-10-05 DIAGNOSIS — M1000 Idiopathic gout, unspecified site: Secondary | ICD-10-CM | POA: Insufficient documentation

## 2017-10-05 DIAGNOSIS — I509 Heart failure, unspecified: Secondary | ICD-10-CM | POA: Insufficient documentation

## 2017-10-05 DIAGNOSIS — D6489 Other specified anemias: Secondary | ICD-10-CM | POA: Diagnosis not present

## 2017-10-05 DIAGNOSIS — R195 Other fecal abnormalities: Secondary | ICD-10-CM | POA: Diagnosis not present

## 2017-10-05 LAB — CBC WITH DIFFERENTIAL/PLATELET
BASOS ABS: 0 10*3/uL (ref 0.0–0.1)
Basophils Relative: 0 %
EOS PCT: 5 %
Eosinophils Absolute: 0.4 10*3/uL (ref 0.0–0.7)
HCT: 31.4 % — ABNORMAL LOW (ref 39.0–52.0)
Hemoglobin: 9.6 g/dL — ABNORMAL LOW (ref 13.0–17.0)
LYMPHS PCT: 25 %
Lymphs Abs: 1.7 10*3/uL (ref 0.7–4.0)
MCH: 25.9 pg — ABNORMAL LOW (ref 26.0–34.0)
MCHC: 30.6 g/dL (ref 30.0–36.0)
MCV: 84.9 fL (ref 78.0–100.0)
Monocytes Absolute: 1 10*3/uL (ref 0.1–1.0)
Monocytes Relative: 15 %
NEUTROS ABS: 3.7 10*3/uL (ref 1.7–7.7)
Neutrophils Relative %: 55 %
PLATELETS: 212 10*3/uL (ref 150–400)
RBC: 3.7 MIL/uL — AB (ref 4.22–5.81)
RDW: 15.8 % — ABNORMAL HIGH (ref 11.5–15.5)
WBC: 6.7 10*3/uL (ref 4.0–10.5)

## 2017-10-05 LAB — BASIC METABOLIC PANEL
Anion gap: 7 (ref 5–15)
BUN: 15 mg/dL (ref 8–23)
CO2: 26 mmol/L (ref 22–32)
Calcium: 8.2 mg/dL — ABNORMAL LOW (ref 8.9–10.3)
Chloride: 102 mmol/L (ref 98–111)
Creatinine, Ser: 1.06 mg/dL (ref 0.61–1.24)
GFR calc Af Amer: >60 mL/min (ref >60)
GFR, EST NON AFRICAN AMERICAN: 55 mL/min — AB (ref >60)
GLUCOSE: 100 mg/dL — AB (ref 70–99)
Potassium: 4 mmol/L (ref 3.5–5.1)
Sodium: 135 mmol/L (ref 135–145)

## 2017-10-06 ENCOUNTER — Encounter (HOSPITAL_COMMUNITY)
Admission: AD | Admit: 2017-10-06 | Discharge: 2017-10-06 | Disposition: A | Discharge status: Home / Self Care | Payer: Medicare Other | Source: Skilled Nursing Facility | Attending: *Deleted | Admitting: *Deleted

## 2017-10-06 DIAGNOSIS — I259 Chronic ischemic heart disease, unspecified: Secondary | ICD-10-CM | POA: Diagnosis not present

## 2017-10-06 DIAGNOSIS — M1000 Idiopathic gout, unspecified site: Secondary | ICD-10-CM | POA: Diagnosis not present

## 2017-10-06 DIAGNOSIS — D6489 Other specified anemias: Secondary | ICD-10-CM | POA: Diagnosis not present

## 2017-10-06 DIAGNOSIS — R05 Cough: Secondary | ICD-10-CM | POA: Diagnosis not present

## 2017-10-06 DIAGNOSIS — R339 Retention of urine, unspecified: Secondary | ICD-10-CM | POA: Diagnosis not present

## 2017-10-06 DIAGNOSIS — I509 Heart failure, unspecified: Secondary | ICD-10-CM | POA: Diagnosis not present

## 2017-10-06 LAB — OCCULT BLOOD X 1 CARD TO LAB, STOOL: Fecal Occult Bld: NEGATIVE

## 2017-10-19 ENCOUNTER — Encounter: Payer: Self-pay | Admitting: Internal Medicine

## 2017-10-19 ENCOUNTER — Non-Acute Institutional Stay (SKILLED_NURSING_FACILITY): Payer: Medicare Other | Admitting: Internal Medicine

## 2017-10-19 DIAGNOSIS — L03115 Cellulitis of right lower limb: Secondary | ICD-10-CM | POA: Diagnosis not present

## 2017-10-19 DIAGNOSIS — M79671 Pain in right foot: Secondary | ICD-10-CM | POA: Diagnosis not present

## 2017-10-19 NOTE — Progress Notes (Signed)
Location:    [facility] Room Number: 139/D Place of Service:  SNF (31) Provider: Veleta Miners MD  Patient, [name]  Patient Care Team: Patient, [name] as PCP - General (General Practice) Danie Binder, MD as Consulting Physician (Gastroenterology) [name], MD as Consulting Physician [facility])  Extended Emergency Contact Information Primary Emergency Contact: Black,Shirley Address: [address]          [place], [place] [address] [name] of [place] Phone: [phone] Mobile Phone: [phone] Relation: Daughter Secondary Emergency Contact: [facility] Phone: [phone] Relation: Niece  Code Status:  DNR Goals of care: Advanced Directive information Advanced Directives 10/19/2017  Does Patient Have a Medical Advance Directive? Yes  Type of Advance Directive Out of facility DNR (pink MOST or yellow form)  Does patient want to make changes to medical advance directive? No - Patient declined  Copy of [place] in Chart? No - copy requested  Pre-existing out of facility DNR order (yellow form or pink MOST form) -     Chief Complaint  Patient presents with  . Acute Visit    Patient is being seen for Right Foot Redness    HPI:  Pt is a 84 y.o. male seen today for an acute visit for Redness in right Foot.  Patient Is Long term resident of facility and has h/o HTN, CHF, Depression, Arthritis, Anemia, GERD, Chronic Foley Cathter due to Urinary retention.PVD ABI in 2015 refused Aggressive approach, Aspiration Pneumonia, Dysphagia, Gout, paroxysmal Atrial Fibrillation not on any anticoagulation Patient was seen today as he was complaining of some redness in his right foot. He states that he got bruised by the weight machine.  And yesterday he noticed the redness.  And today the redness is worse and he has pain when he puts pressure on that foot.  Denies any fever or chills.  No discharge from that  wound.  Past Medical History:  Diagnosis Date  . Anxiety   . Benign prostatic hyperplasia   . Bilateral foot pain   . CHF (congestive heart failure) ([place])   . Depression   . Diverticulitis   . DJD (degenerative joint disease), cervical   . DNR (do not resuscitate)   . Esophageal dysmotility    age related per BPE 04/2015  . Esophageal stricture   . Falling   . GERD (gastroesophageal reflux disease)   . Gout   . Hiatal hernia   . History of recurrent TIAs   . HTN (hypertension)   . Hyperlipidemia   . Inguinal hernia   . Ischemic heart disease   . Kidney stone   . Osteoarthritis    bilat knees  . Pleural effusion   . Reflux   . Restless leg syndrome   . Right hip pain   . Skin cancer, basal cell   . Urinary retention    Past Surgical History:  Procedure Laterality Date  . ESOPHAGEAL DILATION    . EXPLORATORY LAPAROTOMY W/ BOWEL RESECTION    . FRACTURE SURGERY    . HEMIARTHROPLASTY HIP     Dr. Aline Brochure  . intestines      Allergies  Allergen Reactions  . Celebrex [Celecoxib] Other (See Comments)    Unknown; patient can't remember  . Codeine Other (See Comments)    nausea    Outpatient Encounter Medications as of 10/19/2017  Medication Sig  . acetaminophen (TYLENOL) 325 MG tablet Take 650 mg by mouth every 4 (four)  hours as needed.  Marland Kitchen allopurinol (ZYLOPRIM) 100 MG tablet Take 100 mg by mouth daily.  . Cholecalciferol (VITAMIN D) 2000 UNITS CAPS Take 1 capsule by mouth daily.  Marland Kitchen docusate sodium (COLACE) 100 MG capsule Take 1 capsule (100 mg total) by mouth 2 (two) times daily. For constipation.  Marland Kitchen guaiFENesin (MUCINEX) 600 MG 12 hr tablet Take 600 mg 2 (two) times daily as needed by mouth for cough or to loosen phlegm.   Marland Kitchen ipratropium-albuterol (DUONEB) 0.5-2.5 (3) MG/3ML SOLN Take 3 mLs every 6 (six) hours as needed by nebulization (for shortness of breath).  . magnesium hydroxide (MILK OF MAGNESIA) 400 MG/5ML suspension Take 30 mLs by mouth daily as needed for  mild constipation. If no relief try a Fleets enema, after 2 days.  . Menthol, Topical Analgesic, (BIOFREEZE) 4 % GEL Apply small to back as needed for pain  . omeprazole (PRILOSEC) 20 MG capsule Take 40 mg by mouth daily.   . polyethylene glycol (MIRALAX / GLYCOLAX) packet Take 17 g by mouth daily.  Marland Kitchen Propylene Glycol 0.6 % SOLN Apply 1 drop to both eyes twice a day  . rOPINIRole (REQUIP) 2 MG tablet Take 2 mg by mouth at bedtime.  . sertraline (ZOLOFT) 25 MG tablet Take 37.5 mg by mouth at bedtime.  . [DISCONTINUED] aspirin EC 81 MG tablet Take 81 mg by mouth daily.  . [DISCONTINUED] HYDROcodone-acetaminophen (NORCO/VICODIN) 5-325 MG tablet Take 1 tablet by mouth daily.   No facility-administered encounter medications on file as of 10/19/2017.      Review of Systems  Constitutional: Negative.   HENT: Negative.   Respiratory: Negative.   Cardiovascular: Negative.   Genitourinary: Negative.   Neurological: Negative.     Immunization History  Administered Date(s) Administered  . Influenza,inj,Quad PF,6+ Mos 12/07/2014  . Influenza-Unspecified 11/20/2013, 11/13/2015, 11/18/2016  . PPD Test 03/16/2013  . Pneumococcal-Unspecified 02/14/2009, 11/25/2015  . Tdap 11/10/2016   Pertinent  Health Maintenance Due  Topic Date Due  . INFLUENZA VACCINE  10/27/2017 (Originally 09/14/2017)  . PNA vac Low Risk Adult (2 of 2 - PCV13) 11/18/2017 (Originally 11/24/2016)   Fall Risk  11/10/2016  Falls in the past year? No   Functional Status Survey:    There were no vitals filed for this visit. There is no height or weight on file to calculate BMI. Physical Exam  Constitutional: He appears well-developed and well-nourished.  HENT:  Head: Normocephalic.  Mouth/Throat: Oropharynx is clear and moist.  Eyes: Pupils are equal, round, and reactive to light.  Cardiovascular: Normal rate and regular rhythm.  Pulmonary/Chest: Effort normal and breath sounds normal.  Abdominal: Soft. Bowel sounds are  normal.  Musculoskeletal:  Redness and tenderness  On the right Foot with small skin tear on the surface of the foot.    Labs reviewed: Recent Labs    09/27/17 0700 09/30/17 0830 10/05/17 0722  NA 136 136 135  K 4.2 3.9 4.0  CL 102 103 102  CO2 26 24 26   GLUCOSE 99 98 100*  BUN 16 14 15   CREATININE 1.15 1.04 1.06  CALCIUM 8.7* 8.6* 8.2*   Recent Labs    01/02/17 1613 05/16/17 1255  AST 26 17  ALT 14* 10*  ALKPHOS 87 102  BILITOT 0.8 0.4  PROT 7.4 7.1  ALBUMIN 3.5 3.2*   Recent Labs    09/27/17 0700 09/30/17 0830 10/05/17 0722  WBC 7.7 8.6 6.7  NEUTROABS 4.2 5.1 3.7  HGB 10.3* 10.6* 9.6*  HCT 33.3*  33.6* 31.4*  MCV 83.9 83.8 84.9  PLT 157 180 212   Lab Results  Component Value Date   TSH 1.323 04/13/2016   Lab Results  Component Value Date   HGBA1C 6.1 (H) 11/12/2014   No results found for: CHOL, HDL, LDLCALC, LDLDIRECT, TRIG, CHOLHDL  Significant Diagnostic Results in last 30 days:  No results found.  Assessment/Plan  Cellulitis Will start him on Doxycyline BID for 10 days D/W the Wound care Nurse for follow up. Also possible Silver alginate for the surface tear with some scab. Also will do X Ray of Right Foot to rule out any Fracture   Family/ staff Communication:   Labs/tests ordered:

## 2017-10-24 ENCOUNTER — Non-Acute Institutional Stay (SKILLED_NURSING_FACILITY): Payer: Medicare Other | Admitting: Internal Medicine

## 2017-10-24 ENCOUNTER — Encounter: Payer: Self-pay | Admitting: Internal Medicine

## 2017-10-24 DIAGNOSIS — I959 Hypotension, unspecified: Secondary | ICD-10-CM | POA: Diagnosis not present

## 2017-10-24 DIAGNOSIS — L03115 Cellulitis of right lower limb: Secondary | ICD-10-CM

## 2017-10-24 NOTE — Progress Notes (Signed)
Location:    [facility] Room Number: 139/D Place of Service:  SNF (31) Provider:  Granville Lewis  Patient, [name]  Patient Care Team: Patient, [name] as PCP - General (General Practice) Danie Binder, MD as Consulting Physician (Gastroenterology) [name], MD as Consulting Physician [facility])  Extended Emergency Contact Information Primary Emergency Contact: Black,Shirley Address: [address]          [place], [place] [address] [name] of [place] Phone: [phone] Mobile Phone: [phone] Relation: Daughter Secondary Emergency Contact: [facility] Phone: [phone] Relation: Niece  Code Status:  DNR Goals of care: Advanced Directive information Advanced Directives 10/24/2017  Does Patient Have a Medical Advance Directive? Yes  Type of Advance Directive Out of facility DNR (pink MOST or yellow form)  Does patient want to make changes to medical advance directive? No - Patient declined  Copy of [place] in Chart? No - copy requested  Pre-existing out of facility DNR order (yellow form or pink MOST form) -     Chief Complaint  Patient presents with  . Acute Visit    F/U Cellulitis   Of right foot  HPI:  Pt is a 84 y.o. male seen today for an acute visit for follow-up of suspected cellulitis of his right foot.  Patient Is Long term resident of facility and has h/o HTN, CHF, Depression, Arthritis, Anemia, GERD, Chronic Foley Cathter due to Urinary retention.PVD ABI in 2015 refused Aggressive approach, Aspiration Pneumonia, Dysphagia, Gout, paroxysmal Atrial Fibrillation not on any anticoagulation  He was seen last week by Dr. Lyndel Safe secondary to complaints of redness and pain in his right foot.  He gave a history that he possibly got bruised by the machine when he was being weighed.  X-ray was ordered which is come back negative for any fracture or acute process.  He also was put on doxycycline  for suspected cellulitis.  Today he is not complaining of tenderness to the area nursing feels the erythema has improved.  He is on doxycycline until September 16.           Past Medical History:  Diagnosis Date  . Anxiety   . Benign prostatic hyperplasia   . Bilateral foot pain   . CHF (congestive heart failure) ([place])   . Depression   . Diverticulitis   . DJD (degenerative joint disease), cervical   . DNR (do not resuscitate)   . Esophageal dysmotility    age related per BPE 04/2015  . Esophageal stricture   . Falling   . GERD (gastroesophageal reflux disease)   . Gout   . Hiatal hernia   . History of recurrent TIAs   . HTN (hypertension)   . Hyperlipidemia   . Inguinal hernia   . Ischemic heart disease   . Kidney stone   . Osteoarthritis    bilat knees  . Pleural effusion   . Reflux   . Restless leg syndrome   . Right hip pain   . Skin cancer, basal cell   . Urinary retention    Past Surgical History:  Procedure Laterality Date  . ESOPHAGEAL DILATION    . EXPLORATORY LAPAROTOMY W/ BOWEL RESECTION    . FRACTURE SURGERY    . HEMIARTHROPLASTY HIP     Dr. Aline Brochure  . intestines      Allergies  Allergen Reactions  . Celebrex [Celecoxib] Other (See Comments)    Unknown; patient can't remember  .  Codeine Other (See Comments)    nausea    Outpatient Encounter Medications as of 10/24/2017  Medication Sig  . acetaminophen (TYLENOL) 325 MG tablet Take 650 mg by mouth every 4 (four) hours as needed.  Marland Kitchen allopurinol (ZYLOPRIM) 100 MG tablet Take 100 mg by mouth daily.  . Cholecalciferol (VITAMIN D) 2000 UNITS CAPS Take 1 capsule by mouth daily.  Marland Kitchen docusate sodium (COLACE) 100 MG capsule Take 1 capsule (100 mg total) by mouth 2 (two) times daily. For constipation.  Marland Kitchen doxycycline (DORYX) 100 MG EC tablet Take 100 mg by mouth 2 (two) times daily.  Marland Kitchen guaiFENesin (MUCINEX) 600 MG 12 hr tablet Take 600 mg 2 (two) times daily as needed by mouth for cough or to  loosen phlegm.   Marland Kitchen ipratropium-albuterol (DUONEB) 0.5-2.5 (3) MG/3ML SOLN Take 3 mLs every 6 (six) hours as needed by nebulization (for shortness of breath).  . magnesium hydroxide (MILK OF MAGNESIA) 400 MG/5ML suspension Take 30 mLs by mouth daily as needed for mild constipation. If no relief try a Fleets enema, after 2 days.  . Menthol, Topical Analgesic, (BIOFREEZE) 4 % GEL Apply small to back as needed for pain  . omeprazole (PRILOSEC) 20 MG capsule Take 40 mg by mouth daily.   . polyethylene glycol (MIRALAX / GLYCOLAX) packet Take 17 g by mouth daily.  . Probiotic Product (RISA-BID PROBIOTIC PO) Take 1 tablet by mouth twice a day  . Propylene Glycol 0.6 % SOLN Apply 1 drop to both eyes twice a day  . rOPINIRole (REQUIP) 2 MG tablet Take 2 mg by mouth at bedtime.  . sertraline (ZOLOFT) 25 MG tablet Take 37.5 mg by mouth at bedtime.   No facility-administered encounter medications on file as of 10/24/2017.     Review of Systems   General is not complaining of any fever chills.  Skin does not complain of rashes or itching again the erythema on the top of his right foot apparently is improved per nursing.  Head ears eyes nose mouth and throat is not complaining of visual changes he has prescription lenses does not complain of sore throat.  Respiratory does not complain of shortness of breath or cough.  Cardiac does not complain of any chest pain edema appears to be fairly mild-minimal.  GI is not complaining of abdominal discomfort nausea vomiting diarrhea constipation nursing staff feels he has a pretty good appetite but at times will need prompting.  GU he does have an indwelling Foley catheter does not complain of dysuria.  Musculoskeletal is not complaining of joint pain or foot pain at this time.  Neurologic does not complain of feeling dizzy or weak- does not complain of headache.  Psych continues to be very pleasant and appropriate is not complaining of feeling depressed or  anxious  Immunization History  Administered Date(s) Administered  . Influenza,inj,Quad PF,6+ Mos 12/07/2014  . Influenza-Unspecified 11/20/2013, 11/13/2015, 11/18/2016  . PPD Test 03/16/2013  . Pneumococcal-Unspecified 02/14/2009, 11/25/2015  . Tdap 11/10/2016   Pertinent  Health Maintenance Due  Topic Date Due  . INFLUENZA VACCINE  10/27/2017 (Originally 09/14/2017)  . PNA vac Low Risk Adult (2 of 2 - PCV13) 11/18/2017 (Originally 11/24/2016)   Fall Risk  11/10/2016  Falls in the past year? No   Functional Status Survey:    Vitals:   10/24/17 1254  BP: (!) 138/58  Pulse: 87  SpO2: 100%  Of note manual blood pressure was 120/52  Physical Exam   In general this is  a very pleasant elderly male in no distress sitting comfortably in his wheelchair.  His skin is warm and dry on the top of his right foot there is an area of erythema midfoot according to nursing this is less than it was several days ago-he also appears to have a scab in the middle of it apparently this previously was a small skin tear-there is no drainage or odor apparently there was drainage several days ago.  Nursing says this is improving.  He does have numerous solar induced changes and had has numerous skin cancer removals by dermatology.  Eyes visual acuity appears to be intact sclera and conjunctive are clear he has prescription lenses.  Chest is clear to auscultation with somewhat shallow air entry there is no labored breathing.  Heart is regular rate and rhythm without murmur gallop or rub he has very mild lower extremity edema.  Abdomen is soft nontender he has a large abdominal hernia  baseline- bowel sounds are positive  Musculoskeletal moves all extremities x4 at baseline with lower extremity weakness and stiffness which is not new-she has the issue on his right foot as noted above.  Neurologic is grossly intact his speech is clear no lateralizing findings.  Psych he continues to be alert and  oriented pleasant and appropriate continues to be hard of hearing but does very well with supportive care  Labs reviewed: Recent Labs    09/27/17 0700 09/30/17 0830 10/05/17 0722  NA 136 136 135  K 4.2 3.9 4.0  CL 102 103 102  CO2 26 24 26   GLUCOSE 99 98 100*  BUN 16 14 15   CREATININE 1.15 1.04 1.06  CALCIUM 8.7* 8.6* 8.2*   Recent Labs    01/02/17 1613 05/16/17 1255  AST 26 17  ALT 14* 10*  ALKPHOS 87 102  BILITOT 0.8 0.4  PROT 7.4 7.1  ALBUMIN 3.5 3.2*   Recent Labs    09/27/17 0700 09/30/17 0830 10/05/17 0722  WBC 7.7 8.6 6.7  NEUTROABS 4.2 5.1 3.7  HGB 10.3* 10.6* 9.6*  HCT 33.3* 33.6* 31.4*  MCV 83.9 83.8 84.9  PLT 157 180 212   Lab Results  Component Value Date   TSH 1.323 04/13/2016   Lab Results  Component Value Date   HGBA1C 6.1 (H) 11/12/2014   No results found for: CHOL, HDL, LDLCALC, LDLDIRECT, TRIG, CHOLHDL  Significant Diagnostic Results in last 30 days:  No results found.  Assessment/Plan  #1-suspected right foot cellulitis-this appears to be responding to the doxycycline again this will be continued through September 16.  He does not complain of any pain at this time again x-ray was fairly benign showing no acute process.  He is followed by wound care.     #2- previous history of hypotension at times- blood pressures today appear to be stable--as noted above with systolics in the 497-026 range- his Norco has been discontinued thinking this may be contributing to hypotension.  He continues with Tylenol for pain with a history of osteoarthritic pain and this appears to be controlled currently at this point will monitor   [id number]          [id number]

## 2017-10-30 ENCOUNTER — Non-Acute Institutional Stay (SKILLED_NURSING_FACILITY): Payer: Medicare Other | Admitting: Internal Medicine

## 2017-10-30 ENCOUNTER — Encounter: Payer: Self-pay | Admitting: Internal Medicine

## 2017-10-30 DIAGNOSIS — Z8701 Personal history of pneumonia (recurrent): Secondary | ICD-10-CM

## 2017-10-30 DIAGNOSIS — R05 Cough: Secondary | ICD-10-CM | POA: Diagnosis not present

## 2017-10-30 DIAGNOSIS — R059 Cough, unspecified: Secondary | ICD-10-CM

## 2017-10-30 DIAGNOSIS — R0989 Other specified symptoms and signs involving the circulatory and respiratory systems: Secondary | ICD-10-CM

## 2017-10-30 DIAGNOSIS — L03115 Cellulitis of right lower limb: Secondary | ICD-10-CM

## 2017-10-30 NOTE — Progress Notes (Signed)
Location:    [place] Room Number: 139/D Place of Service:  SNF (31) Provider:  Granville Lewis  Patient, [name]  Patient Care Team: Patient, [name] as PCP - General (General Practice) Danie Binder, MD as Consulting Physician (Gastroenterology) [name], MD as Consulting Physician [facility])  Extended Emergency Contact Information Primary Emergency Contact: Black,Shirley Address: [address]          [place], [place] [address] [name] of [place] Phone: [phone] Mobile Phone: [phone] Relation: Daughter Secondary Emergency Contact: [facility] Phone: [phone] Relation: Niece  Code Status:  DNR Goals of care: Advanced Directive information Advanced Directives 10/30/2017  Does Patient Have a Medical Advance Directive? Yes  Type of Advance Directive Out of facility DNR (pink MOST or yellow form)  Does patient want to make changes to medical advance directive? No - Patient declined  Copy of [place] in Chart? No - copy requested  Pre-existing out of facility DNR order (yellow form or pink MOST form) -     Chief Complaint  Patient presents with  . Acute Visit    Patients c/o Cough and Congestion    HPI:  Pt is a 84 y.o. male seen today for an acute visit for follow-up of cough and congestion.  He does have a history of suspected aspiration has been treated successfully before on Mucinex as well as nebulizers and actually has been on prednisone in the past as well as antibiotic. He is on a dysphagia diet but his has been somewhat liberalized secondary to his preference--quality of life issues  He actually is finishing a course of doxycycline for right foot cellulitis the area is currently covered but per nursing this continues to improve.  His other diagnoses include GERD he is on a proton pump inhibitor.  He also has a history of hypertension CHF depression anemia as well as peripheral  vascular disease and A. fib.  He also has a chronic indwelling Foley catheter because of urinary retention.  He also has significant peripheral vascular disease but does not want any aggressive intervention.  This morning nursing staff noted he had increased cough and congestion I am following up on this is vital signs actually are stable his blood pressure at times tends to run a little low but he is relatively asymptomatic.  He does not complain of shortness of breath but does have what appears to be a productive cough of whitish phlegm.     Past Medical History:  Diagnosis Date  . Anxiety   . Benign prostatic hyperplasia   . Bilateral foot pain   . CHF (congestive heart failure) ([place])   . Depression   . Diverticulitis   . DJD (degenerative joint disease), cervical   . DNR (do not resuscitate)   . Esophageal dysmotility    age related per BPE 04/2015  . Esophageal stricture   . Falling   . GERD (gastroesophageal reflux disease)   . Gout   . Hiatal hernia   . History of recurrent TIAs   . HTN (hypertension)   . Hyperlipidemia   . Inguinal hernia   . Ischemic heart disease   . Kidney stone   . Osteoarthritis    bilat knees  . Pleural effusion   . Reflux   . Restless leg syndrome   . Right hip pain   . Skin cancer, basal cell   . Urinary retention    Past Surgical History:  Procedure Laterality Date  . ESOPHAGEAL DILATION    . EXPLORATORY LAPAROTOMY W/ BOWEL RESECTION    . FRACTURE SURGERY    . HEMIARTHROPLASTY HIP     Dr. Aline Brochure  . intestines      Allergies  Allergen Reactions  . Celebrex [Celecoxib] Other (See Comments)    Unknown; patient can't remember  . Codeine Other (See Comments)    nausea    Outpatient Encounter Medications as of 10/30/2017  Medication Sig  . acetaminophen (TYLENOL) 325 MG tablet Take 650 mg by mouth every 4 (four) hours as needed.  Marland Kitchen allopurinol (ZYLOPRIM) 100 MG tablet Take 100 mg by mouth daily.  . Cholecalciferol (VITAMIN  D) 2000 UNITS CAPS Take 1 capsule by mouth daily.  Marland Kitchen docusate sodium (COLACE) 100 MG capsule Take 1 capsule (100 mg total) by mouth 2 (two) times daily. For constipation.  Marland Kitchen doxycycline (DORYX) 100 MG EC tablet Take 100 mg by mouth 2 (two) times daily.  Marland Kitchen guaiFENesin (MUCINEX) 600 MG 12 hr tablet Take 600 mg 2 (two) times daily as needed by mouth for cough or to loosen phlegm.   Marland Kitchen ipratropium-albuterol (DUONEB) 0.5-2.5 (3) MG/3ML SOLN Take 3 mLs every 6 (six) hours as needed by nebulization (for shortness of breath).  . magnesium hydroxide (MILK OF MAGNESIA) 400 MG/5ML suspension Take 30 mLs by mouth daily as needed for mild constipation. If no relief try a Fleets enema, after 2 days.  . Menthol, Topical Analgesic, (BIOFREEZE) 4 % GEL Apply small to back as needed for pain  . omeprazole (PRILOSEC) 20 MG capsule Take 40 mg by mouth daily.   . polyethylene glycol (MIRALAX / GLYCOLAX) packet Take 17 g by mouth daily.  . Probiotic Product (RISA-BID PROBIOTIC PO) Take 1 tablet by mouth twice a day  . Propylene Glycol 0.6 % SOLN Apply 1 drop to both eyes twice a day  . rOPINIRole (REQUIP) 2 MG tablet Take 2 mg by mouth at bedtime.  . sertraline (ZOLOFT) 25 MG tablet Take 37.5 mg by mouth at bedtime.   No facility-administered encounter medications on file as of 10/30/2017.     Review of Systems   In general is not complain of fever chills just says he feels a bit washed out today.  Skin does not complain of rashes or itching has numerous skin cancer removals followed by dermatology.  Head ears eyes nose mouth and throat has prescription lenses is not complaining of any sore throat or difficulty swallowing at this point.  Respiratory does not complain of shortness of breath but does have a productive cough it appears.  Cardiac does not complain of chest pain has some mild lower extremity edema.  GI is not complaining of abdominal pain nausea vomiting diarrhea constipation.  Musculoskeletal at  times does have complaints of diffuse osteoarthritic pain especially of his legs but is not complaining of that this afternoon.  Neurologic is not complaining of any headache or dizziness.  And psych continues to be in good spirits does not complain of being overtly depressed or anxious  Immunization History  Administered Date(s) Administered  . Influenza,inj,Quad PF,6+ Mos 12/07/2014  . Influenza-Unspecified 11/20/2013, 11/13/2015, 11/18/2016  . PPD Test 03/16/2013  . Pneumococcal-Unspecified 02/14/2009, 11/25/2015  . Tdap 11/10/2016   Pertinent  Health Maintenance Due  Topic Date Due  . PNA vac Low Risk Adult (2 of 2 - PCV13) 11/18/2017 (Originally 11/24/2016)  . INFLUENZA VACCINE  11/29/2017 (Originally 09/14/2017)   Fall Risk  11/10/2016  Falls  in the past year? No   Functional Status Survey:    Temperature is 97.9 pulse is 68 respirations of 20 blood pressure 98/40- 136/48 in this range today.   Physical Exam   General this is a pleasant elderly resident in no distress sitting in his wheelchair he does appear a bit weak today.  His skin is warm and dry is not diaphoretic he does have numerous skin cancer removals with solar induced changes diffuse.  Eyes sclera and conjunctive appear to be clear he has prescription lenses.  Oropharynx appears to have possibly some whitish phlegm mucous membranes are moist.  Chest he has scattered coarse breath sounds more on the right versus the left when he coughs this clears significantly on the left but still has some residual on the right there is no labored breathing  \Heart is regular rate and rhythm with.  Occasional irregular beat he has mild lower extremity edema  Abdomen is soft he does have a large abdominal hernia it is soft nontender he does have positive bowel sounds.  GU he does have an indwelling Foley catheter.  Musculoskeletal has general frailty but moves all extremities at baseline with diffuse arthritic  changes.  His right foot currently is covered per nursing this shows continuing improvement of the cellulitis with less erythema he does have a scab in the center of this.  Neurologic is grossly intact he is alert no lateralizing findings.  Psych he is largely alert and oriented pleasant and cooperative which is his baseline   Labs reviewed: Recent Labs    09/27/17 0700 09/30/17 0830 10/05/17 0722  NA 136 136 135  K 4.2 3.9 4.0  CL 102 103 102  CO2 26 24 26   GLUCOSE 99 98 100*  BUN 16 14 15   CREATININE 1.15 1.04 1.06  CALCIUM 8.7* 8.6* 8.2*   Recent Labs    01/02/17 1613 05/16/17 1255  AST 26 17  ALT 14* 10*  ALKPHOS 87 102  BILITOT 0.8 0.4  PROT 7.4 7.1  ALBUMIN 3.5 3.2*   Recent Labs    09/27/17 0700 09/30/17 0830 10/05/17 0722  WBC 7.7 8.6 6.7  NEUTROABS 4.2 5.1 3.7  HGB 10.3* 10.6* 9.6*  HCT 33.3* 33.6* 31.4*  MCV 83.9 83.8 84.9  PLT 157 180 212   Lab Results  Component Value Date   TSH 1.323 04/13/2016   Lab Results  Component Value Date   HGBA1C 6.1 (H) 11/12/2014   No results found for: CHOL, HDL, LDLCALC, LDLDIRECT, TRIG, CHOLHDL  Significant Diagnostic Results in last 30 days:  No results found.  Assessment/Plan  #1-cough and congestion with possible aspiration-he is not in any distress but does have a fairly significant cough and some congestion- will start him on a prednisone every two  days until he reached 10 mg and then will do 5 mg 2 days before discontinuing.  Also will extend his doxycycline for 5 more days.  Will make his DuoNeb's routine every 6 hours for now x5 days and then as needed.  And will make his Mucinex twice daily routine as well for the next 7 days  Monitor vital signs pulse ox to shift for 72 hours as well.  2.  Right foot cellulitis again per nursing this is improving again we will continue the doxycycline.  [id number] note greater than 25 minutes spent assessing patient- reviewing his chart and labs  discussing status with nursing staff-and coordinating a plan of care of note greater than 50% of  time spent coordinating plan of care with input as noted above

## 2017-11-06 DIAGNOSIS — B351 Tinea unguium: Secondary | ICD-10-CM | POA: Diagnosis not present

## 2017-11-06 DIAGNOSIS — I739 Peripheral vascular disease, unspecified: Secondary | ICD-10-CM | POA: Diagnosis not present

## 2017-11-20 ENCOUNTER — Encounter: Payer: Self-pay | Admitting: Internal Medicine

## 2017-11-20 ENCOUNTER — Non-Acute Institutional Stay (SKILLED_NURSING_FACILITY): Payer: Medicare Other | Admitting: Internal Medicine

## 2017-11-20 DIAGNOSIS — Z8719 Personal history of other diseases of the digestive system: Secondary | ICD-10-CM | POA: Diagnosis not present

## 2017-11-20 DIAGNOSIS — F329 Major depressive disorder, single episode, unspecified: Secondary | ICD-10-CM | POA: Diagnosis not present

## 2017-11-20 DIAGNOSIS — J690 Pneumonitis due to inhalation of food and vomit: Secondary | ICD-10-CM

## 2017-11-20 DIAGNOSIS — T148XXA Other injury of unspecified body region, initial encounter: Secondary | ICD-10-CM

## 2017-11-20 DIAGNOSIS — F32A Depression, unspecified: Secondary | ICD-10-CM

## 2017-11-20 NOTE — Progress Notes (Signed)
This is an acute visit.  Level of care is skilled.  Facility is [facility].  Chief complaint-acute visit secondary to hematoma upper left arm.  History of present illness.  Patient is a pleasant 84 year old male seen today for a new hematoma on the back of his left upper arm.  Patient Is Long term resident of facility and has h/o HTN, CHF, Depression, Arthritis, Anemia, GERD, Chronic Foley Cathter due to Urinary retention.PVD ABI in 2015 refused Aggressive approach, Aspiration Pneumonia, Dysphagia, Gout, paroxysmal Atrial Fibrillation  was not on any anticoagulation--other than low-dose aspirin.  Which has since been discontinued because of rectal bleeding  There have been no recent episodes of rectal bleeding and patient has deferred any aggressive work-up of this he does have a history ofdiverticulitis with perforation and required a laparotomy and a sigmoid resection  Nursing does not report any recent episodes of rectal bleeding he appears stable in this regard to his hemoglobin is been relatively stable.  This morning apparently nursing noted that he does have a somewhat large violaceous bruise at the back of his left arm- patient denies any recent trauma or falls-he says it does not hurt.  Again he is not on any anticoagulation.  He was also seen approximately 3 weeks ago for increased cough and congestion he does get this at times is concerned for possible some chronic aspiration.  Nonetheless he responded well to a course of doxycycline as well as Mucinex duo nebs and prednisone- he continues on PRN he is snacks and duo nebs he does not really complain of any cough or congestion today nursing has not noted this either       Past Medical History:  Diagnosis Date  . Anxiety   . Benign prostatic hyperplasia   . Bilateral foot pain   . CHF (congestive heart failure) ([place])   . Depression   . Diverticulitis   . DJD (degenerative joint disease), cervical   . DNR  (do not resuscitate)   . Esophageal dysmotility    age related per BPE 04/2015  . Esophageal stricture   . Falling   . GERD (gastroesophageal reflux disease)   . Gout   . Hiatal hernia   . History of recurrent TIAs   . HTN (hypertension)   . Hyperlipidemia   . Inguinal hernia   . Ischemic heart disease   . Kidney stone   . Osteoarthritis    bilat knees  . Pleural effusion   . Reflux   . Restless leg syndrome   . Right hip pain   . Skin cancer, basal cell   . Urinary retention         Past Surgical History:  Procedure Laterality Date  . ESOPHAGEAL DILATION    . EXPLORATORY LAPAROTOMY W/ BOWEL RESECTION    . FRACTURE SURGERY    . HEMIARTHROPLASTY HIP     Dr. Aline Brochure  . intestines           Allergies  Allergen Reactions  . Celebrex [Celecoxib] Other (See Comments)    Unknown; patient can't remember  . Codeine Other (See Comments)    nausea        Outpatient Encounter Medications as of 10/30/2017  Medication Sig  . acetaminophen (TYLENOL) 325 MG tablet Take 650 mg by mouth every 4 (four) hours as needed.  Marland Kitchen allopurinol (ZYLOPRIM) 100 MG tablet Take 100 mg by mouth daily.  . Cholecalciferol (VITAMIN D) 2000 UNITS CAPS Take 1 capsule by mouth daily.  Marland Kitchen  docusate sodium (COLACE) 100 MG capsule Take 1 capsule (100 mg total) by mouth 2 (two) times daily. For constipation.  Marland Kitchen doxycycline (DORYX) 100 MG EC tablet Take 100 mg by mouth 2 (two) times daily.  Marland Kitchen guaiFENesin (MUCINEX) 600 MG 12 hr tablet Take 600 mg 2 (two) times daily as needed by mouth for cough or to loosen phlegm.   Marland Kitchen ipratropium-albuterol (DUONEB) 0.5-2.5 (3) MG/3ML SOLN Take 3 mLs every 6 (six) hours as needed by nebulization (for shortness of breath).  . magnesium hydroxide (MILK OF MAGNESIA) 400 MG/5ML suspension Take 30 mLs by mouth daily as needed for mild constipation. If no relief try a Fleets enema, after 2 days.  . Menthol, Topical Analgesic, (BIOFREEZE) 4 %  GEL Apply small to back as needed for pain  . omeprazole (PRILOSEC) 20 MG capsule Take 40 mg by mouth daily.   . polyethylene glycol (MIRALAX / GLYCOLAX) packet Take 17 g by mouth daily.  . Probiotic Product (RISA-BID PROBIOTIC PO) Take 1 tablet by mouth twice a day  . Propylene Glycol 0.6 % SOLN Apply 1 drop to both eyes twice a day  . rOPINIRole (REQUIP) 2 MG tablet Take 2 mg by mouth at bedtime.  . sertraline (ZOLOFT) 25 MG tablet Take 37.5 mg by mouth at bedtime.   No facility-administered encounter medications on file as of 10/30/2017.    Review of systems.  General is not complaining of any fever chills.  Skin does have some chronic bruising but the bruising on his left upper arm appears to be new-does not complain of any diaphoresis or itching or rash.  Head ears eyes nose mouth and throat is not complain of visual changes or sore throat he is very hard of hearing.  Respiratory does not complain of shortness of breath or cough.  Cardiac does not complain of any chest pain has quite mild lower extremity edema.  GI is not complaining of abdominal discomfort nausea vomiting diarrhea or constipation  GU does have a chronic indwelling Foley catheter draining amber-colored urine.  Musculoskeletal is not really complaining of joint pain at this time is not really complaining of any arm pain or discomfort.  Neurologic is not complain of feeling dizzy or having a headache.  And psych continues to be in good spirits and he is very hard of hearing-he is in a new room and appears a little bit frustrated by his new surroundings although this is a private room    Physical exam.  Temperature is 98.1 pulse 78 respirations 20 blood pressure 111/66.  In general this is a pleasant elderly male in no distress sitting comfortably in his wheelchair.  His skin is warm and dry he does have numerous skin cancer removals-solar induced changes- with some chronic bruising upper extremities  bilaterally.  He does appear to have some new bruising posterior aspect of his left upper arm this is largely violaceous in appearance proximately 8 x 4 cm in dimensions- there appeared to be a small blood pocket in the middle of this- does not really  appear firm tender or warm I do not see any evidence of cellulitis  Eyes visual acuity appears to be intact sclera and conjunctive are clear.  Oropharynx is clear mucous membranes moist I do not see any evidence of bleeding.  Chest is clear to auscultation with somewhat shallow air entry there is no labored breathing.  Heart is largely regular rate and rhythm without murmur gallop or rub he has quite mild lower  extremity edema bilaterally.  Abdomen is soft nontender continues with a large abdominal hernia-bowel sounds are positive.  GU he has an indwelling Foley catheter draining amber-colored urine.  Musculoskeletal moves all extremities x4 with general arthritic changes- is able to flex and extend his left arm without pain is able to lift his arm without pain strength appears to be intact at baseline all 4 extremities   Neurologic is grossly intact his speech is clear could not really appreciate lateralizing findings.  Psych he is hard of hearing but is alert and oriented pleasant appropriate--again a little bit frustrated with his new room and new surroundings     Labs.  October 05, 2017.  Sodium 135 potassium 4 BUN 15 creatinine 1.06.  WBC 6.7 hemoglobin 9.6 platelets 212.  Assessment and plan.  1.  Left arm hematoma this appears new- although etiology is indeterminate- he does not appear to be symptomatic of any pain or infection- will update a CBC with differential to keep an eye on his hemoglobin and platelets I suspect this will probably be fairly stable but will check.  He is not on any anticoagulant.  2.  History of rectal bleeding per discussion above again he does not really want aggressive work-up at this and nursing  staff has not really noted any evidence of this recently-again will await updated lab work.  3.  Recent history of cough and congestion-this appears to have largely resolved he did receive a short dose of prednisone as well as a course of doxycycline- continues on duo nebs as needed and Mucinex as needed as well  4.  History of depression he continues on Zoloft and has been followed by psychiatric services- at one point he did express depressive thoughts but this has been sometime ago and has really stabilized-- he is a little frustrated by his new room but is not really showing signs of any acute depression-in fact he is now in a private room which apparently he has wanted for some time I suspect he is having a little adjustment difficulty with new surroundings will monitor   [id number]

## 2017-11-21 ENCOUNTER — Encounter (HOSPITAL_COMMUNITY)
Admission: RE | Admit: 2017-11-21 | Discharge: 2017-11-21 | Disposition: A | Discharge status: Home / Self Care | Payer: Medicare Other | Source: Skilled Nursing Facility | Attending: Internal Medicine | Admitting: Internal Medicine

## 2017-11-21 DIAGNOSIS — J42 Unspecified chronic bronchitis: Secondary | ICD-10-CM | POA: Insufficient documentation

## 2017-11-21 DIAGNOSIS — M7981 Nontraumatic hematoma of soft tissue: Secondary | ICD-10-CM | POA: Insufficient documentation

## 2017-11-21 DIAGNOSIS — I259 Chronic ischemic heart disease, unspecified: Secondary | ICD-10-CM | POA: Insufficient documentation

## 2017-11-21 DIAGNOSIS — I509 Heart failure, unspecified: Secondary | ICD-10-CM | POA: Insufficient documentation

## 2017-11-21 DIAGNOSIS — R195 Other fecal abnormalities: Secondary | ICD-10-CM | POA: Insufficient documentation

## 2017-11-21 LAB — CBC WITH DIFFERENTIAL/PLATELET
BASOS ABS: 0 10*3/uL (ref 0.0–0.1)
BASOS PCT: 0 %
EOS ABS: 0.1 10*3/uL (ref 0.0–0.5)
EOS PCT: 2 %
HEMATOCRIT: 31 % — AB (ref 39.0–52.0)
Hemoglobin: 9.6 g/dL — ABNORMAL LOW (ref 13.0–17.0)
Lymphocytes Relative: 23 %
Lymphs Abs: 1.5 10*3/uL (ref 0.7–4.0)
MCH: 25.5 pg — ABNORMAL LOW (ref 26.0–34.0)
MCHC: 31 g/dL (ref 30.0–36.0)
MCV: 82.4 fL (ref 80.0–100.0)
MONOS PCT: 19 %
Monocytes Absolute: 1.2 10*3/uL — ABNORMAL HIGH (ref 0.1–1.0)
Neutro Abs: 3.6 10*3/uL (ref 1.7–7.7)
Neutrophils Relative %: 56 %
PLATELETS: 203 10*3/uL (ref 150–400)
RBC: 3.76 MIL/uL — ABNORMAL LOW (ref 4.22–5.81)
RDW: 15.9 % — AB (ref 11.5–15.5)
WBC: 6.4 10*3/uL (ref 4.0–10.5)

## 2017-11-21 LAB — BASIC METABOLIC PANEL
ANION GAP: 8 (ref 5–15)
BUN: 11 mg/dL (ref 8–23)
CALCIUM: 8.2 mg/dL — AB (ref 8.9–10.3)
CO2: 25 mmol/L (ref 22–32)
CREATININE: 1.12 mg/dL (ref 0.61–1.24)
Chloride: 101 mmol/L (ref 98–111)
GFR, EST AFRICAN AMERICAN: 59 mL/min — AB (ref >60)
GFR, EST NON AFRICAN AMERICAN: 51 mL/min — AB (ref >60)
Glucose, Bld: 88 mg/dL (ref 70–99)
Potassium: 4.5 mmol/L (ref 3.5–5.1)
SODIUM: 134 mmol/L — AB (ref 135–145)

## 2017-11-28 ENCOUNTER — Non-Acute Institutional Stay (SKILLED_NURSING_FACILITY): Payer: Medicare Other

## 2017-11-28 DIAGNOSIS — Z0000 Encounter for general adult medical examination without abnormal findings: Secondary | ICD-10-CM | POA: Diagnosis not present

## 2017-11-28 NOTE — Patient Instructions (Addendum)
Patrick [name] , Thank you for taking time to come for your Medicare Wellness Visit. I appreciate your ongoing commitment to your health goals. Please review the following plan we discussed and let me know if I can assist you in the future.   Screening recommendations/referrals: Colonoscopy excluded, over age 82 Recommended yearly ophthalmology/optometry visit for glaucoma screening and checkup Recommended yearly dental visit for hygiene and checkup  Vaccinations: Influenza vaccine due, will receive at [facility] Pneumococcal vaccine up to date, completed Tdap vaccine up to date, due 11/11/2026 Shingles vaccine not in past records    Advanced directives: in chart  Conditions/risks identified: none  Next appointment: Dr. Lyndel Safe makes rounds  Preventive Care 12 Years and Older, Male Preventive care refers to lifestyle choices and visits with your health care provider that can promote health and wellness. What does preventive care include?  A yearly physical exam. This is also called an annual well check.  Dental exams once or twice a year.  Routine eye exams. Ask your health care provider how often you should have your eyes checked.  Personal lifestyle choices, including:  Daily care of your teeth and gums.  Regular physical activity.  Eating a healthy diet.  Avoiding tobacco and drug use.  Limiting alcohol use.  Practicing safe sex.  Taking low doses of aspirin every day.  Taking vitamin and mineral supplements as recommended by your health care provider. What happens during an annual well check? The services and screenings done by your health care provider during your annual well check will depend on your age, overall health, lifestyle risk factors, and family history of disease. Counseling  Your health care provider may ask you questions about your:  Alcohol use.  Tobacco use.  Drug use.  Emotional well-being.  Home and relationship well-being.  Sexual  activity.  Eating habits.  History of falls.  Memory and ability to understand (cognition).  Work and work Statistician. Screening  You may have the following tests or measurements:  Height, weight, and BMI.  Blood pressure.  Lipid and cholesterol levels. These may be checked every 5 years, or more frequently if you are over 44 years old.  Skin check.  Lung cancer screening. You may have this screening every year starting at age 81 if you have a 30-pack-year history of smoking and currently smoke or have quit within the past 15 years.  Fecal occult blood test (FOBT) of the stool. You may have this test every year starting at age 107.  Flexible sigmoidoscopy or colonoscopy. You may have a sigmoidoscopy every 5 years or a colonoscopy every 10 years starting at age 82.  Prostate cancer screening. Recommendations will vary depending on your family history and other risks.  Hepatitis C blood test.  Hepatitis B blood test.  Sexually transmitted disease (STD) testing.  Diabetes screening. This is done by checking your blood sugar (glucose) after you have not eaten for a while (fasting). You may have this done every 1-3 years.  Abdominal aortic aneurysm (AAA) screening. You may need this if you are a current or former smoker.  Osteoporosis. You may be screened starting at age 29 if you are at high risk. Talk with your health care provider about your test results, treatment options, and if necessary, the need for more tests. Vaccines  Your health care provider may recommend certain vaccines, such as:  Influenza vaccine. This is recommended every year.  Tetanus, diphtheria, and acellular pertussis (Tdap, Td) vaccine. You may need a Td booster  every 10 years.  Zoster vaccine. You may need this after age 22.  Pneumococcal 13-valent conjugate (PCV13) vaccine. One dose is recommended after age 14.  Pneumococcal polysaccharide (PPSV23) vaccine. One dose is recommended after age  75. Talk to your health care provider about which screenings and vaccines you need and how often you need them. This information is not intended to replace advice given to you by your health care provider. Make sure you discuss any questions you have with your health care provider. Document Released: 02/27/2015 Document Revised: 10/21/2015 Document Reviewed: 12/02/2014 Elsevier Interactive Patient Education  2017 [place] Prevention in the Home Falls can cause injuries. They can happen to people of all ages. There are many things you can do to make your home safe and to help prevent falls. What can I do on the outside of my home?  Regularly fix the edges of walkways and driveways and fix any cracks.  Remove anything that might make you trip as you walk through a door, such as a raised step or threshold.  Trim any bushes or trees on the path to your home.  Use bright outdoor lighting.  Clear any walking paths of anything that might make someone trip, such as rocks or tools.  Regularly check to see if handrails are loose or broken. Make sure that both sides of any steps have handrails.  Any raised decks and porches should have guardrails on the edges.  Have any leaves, snow, or ice cleared regularly.  Use sand or salt on walking paths during winter.  Clean up any spills in your garage right away. This includes oil or grease spills. What can I do in the bathroom?  Use night lights.  Install grab bars by the toilet and in the tub and shower. Do not use towel bars as grab bars.  Use non-skid mats or decals in the tub or shower.  If you need to sit down in the shower, use a plastic, non-slip stool.  Keep the floor dry. Clean up any water that spills on the floor as soon as it happens.  Remove soap buildup in the tub or shower regularly.  Attach bath mats securely with double-sided non-slip rug tape.  Do not have throw rugs and other things on the floor that can make  you trip. What can I do in the bedroom?  Use night lights.  Make sure that you have a light by your bed that is easy to reach.  Do not use any sheets or blankets that are too big for your bed. They should not hang down onto the floor.  Have a firm chair that has side arms. You can use this for support while you get dressed.  Do not have throw rugs and other things on the floor that can make you trip. What can I do in the kitchen?  Clean up any spills right away.  Avoid walking on wet floors.  Keep items that you use a lot in easy-to-reach places.  If you need to reach something above you, use a strong step stool that has a grab bar.  Keep electrical cords out of the way.  Do not use floor polish or wax that makes floors slippery. If you must use wax, use non-skid floor wax.  Do not have throw rugs and other things on the floor that can make you trip. What can I do with my stairs?  Do not leave any items on the stairs.  Make  sure that there are handrails on both sides of the stairs and use them. Fix handrails that are broken or loose. Make sure that handrails are as long as the stairways.  Check any carpeting to make sure that it is firmly attached to the stairs. Fix any carpet that is loose or worn.  Avoid having throw rugs at the top or bottom of the stairs. If you do have throw rugs, attach them to the floor with carpet tape.  Make sure that you have a light switch at the top of the stairs and the bottom of the stairs. If you do not have them, ask someone to add them for you. What else can I do to help prevent falls?  Wear shoes that:  Do not have high heels.  Have rubber bottoms.  Are comfortable and fit you well.  Are closed at the toe. Do not wear sandals.  If you use a stepladder:  Make sure that it is fully opened. Do not climb a closed stepladder.  Make sure that both sides of the stepladder are locked into place.  Ask someone to hold it for you, if  possible.  Clearly mark and make sure that you can see:  Any grab bars or handrails.  First and last steps.  Where the edge of each step is.  Use tools that help you move around (mobility aids) if they are needed. These include:  Canes.  Walkers.  Scooters.  Crutches.  Turn on the lights when you go into a dark area. Replace any light bulbs as soon as they burn out.  Set up your furniture so you have a clear path. Avoid moving your furniture around.  If any of your floors are uneven, fix them.  If there are any pets around you, be aware of where they are.  Review your medicines with your doctor. Some medicines can make you feel dizzy. This can increase your chance of falling. Ask your doctor what other things that you can do to help prevent falls. This information is not intended to replace advice given to you by your health care provider. Make sure you discuss any questions you have with your health care provider. Document Released: 11/27/2008 Document Revised: 07/09/2015 Document Reviewed: 03/07/2014 Elsevier Interactive Patient Education  2017 [employer].

## 2017-11-28 NOTE — Progress Notes (Signed)
Subjective:   Patrick [name] is a 84 y.o. male who presents for Medicare Annual/Subsequent preventive examination at [facility]  Last AWV-11/10/2016    Objective:    Vitals: BP 118/64 (BP Location: Left Arm, Patient Position: Sitting)   Pulse 62   Temp 98 F (36.7 C) (Oral)   Ht 6' (1.829 m)   Wt 167 lb (75.8 kg)   BMI 22.65 kg/m   Body mass index is 22.65 kg/m.  Advanced Directives 11/28/2017 10/30/2017 10/24/2017 10/19/2017 09/29/2017 09/29/2017 09/28/2017  Does Patient Have a Medical Advance Directive? Yes Yes Yes Yes Yes Yes Yes  Type of Advance Directive Out of facility DNR (pink MOST or yellow form) Out of facility DNR (pink MOST or yellow form) Out of facility DNR (pink MOST or yellow form) Out of facility DNR (pink MOST or yellow form) Out of facility DNR (pink MOST or yellow form) Out of facility DNR (pink MOST or yellow form) Out of facility DNR (pink MOST or yellow form)  Does patient want to make changes to medical advance directive? No - Patient declined No - Patient declined No - Patient declined No - Patient declined No - Patient declined No - Patient declined No - Patient declined  Copy of [place] in Chart? No - copy requested No - copy requested No - copy requested No - copy requested No - copy requested No - copy requested No - copy requested  Pre-existing out of facility DNR order (yellow form or pink MOST form) Yellow form placed in chart (order not valid for inpatient use) - - - - - -    Tobacco Social History   Tobacco Use  Smoking Status Never Smoker  Smokeless Tobacco Never Used     Counseling given: Not Answered   Clinical Intake:  Pre-visit preparation completed: No  Pain : 0-10 Pain Score: 7  Pain Type: Chronic pain Pain Location: Hip Pain Orientation: Left Pain Descriptors / Indicators: Aching Pain Onset: More than a month ago Pain Frequency: Intermittent     Diabetes: No  How often do you need  to have someone help you when you read instructions, pamphlets, or other written materials from your doctor or pharmacy?: 3 - Sometimes What is the last grade level you completed in school?: High School  Interpreter Needed?: No  Information entered by :: Tyson Dense, RN  Past Medical History:  Diagnosis Date  . Anxiety   . Benign prostatic hyperplasia   . Bilateral foot pain   . CHF (congestive heart failure) ([place])   . Depression   . Diverticulitis   . DJD (degenerative joint disease), cervical   . DNR (do not resuscitate)   . Esophageal dysmotility    age related per BPE 04/2015  . Esophageal stricture   . Falling   . GERD (gastroesophageal reflux disease)   . Gout   . Hiatal hernia   . History of recurrent TIAs   . HTN (hypertension)   . Hyperlipidemia   . Inguinal hernia   . Ischemic heart disease   . Kidney stone   . Osteoarthritis    bilat knees  . Pleural effusion   . Reflux   . Restless leg syndrome   . Right hip pain   . Skin cancer, basal cell   . Urinary retention    Past Surgical History:  Procedure Laterality Date  . ESOPHAGEAL DILATION    . EXPLORATORY LAPAROTOMY W/ BOWEL RESECTION    .  FRACTURE SURGERY    . HEMIARTHROPLASTY HIP     Dr. Aline Brochure  . intestines     Family History  Problem Relation Age of Onset  . Arthritis Unknown    Social History   Socioeconomic History  . Marital status: Married    Spouse name: Not on file  . Number of children: Not on file  . Years of education: na  . Highest education level: Not on file  Occupational History  . Occupation: retired  Scientific laboratory technician  . Financial resource strain: Not hard at all  . Food insecurity:    Worry: Never true    Inability: Never true  . Transportation needs:    Medical: No    Non-medical: No  Tobacco Use  . Smoking status: Never Smoker  . Smokeless tobacco: Never Used  Substance and Sexual Activity  . Alcohol use: No    Alcohol/week: 0.0 standard drinks  . Drug use: No    . Sexual activity: Not on file  Lifestyle  . Physical activity:    Days per week: 0 days    Minutes per session: 0 min  . Stress: Not at all  Relationships  . Social connections:    Talks on phone: Never    Gets together: Never    Attends religious service: Never    Active member of club or organization: No    Attends meetings of clubs or organizations: Never    Relationship status: Married  Other Topics Concern  . Not on file  Social History Narrative  . Not on file    Outpatient Encounter Medications as of 11/28/2017  Medication Sig  . acetaminophen (TYLENOL) 325 MG tablet Take 650 mg by mouth every 4 (four) hours as needed.  Marland Kitchen allopurinol (ZYLOPRIM) 100 MG tablet Take 100 mg by mouth daily.  . Cholecalciferol (VITAMIN D) 2000 UNITS CAPS Take 1 capsule by mouth daily.  Marland Kitchen docusate sodium (COLACE) 100 MG capsule Take 1 capsule (100 mg total) by mouth 2 (two) times daily. For constipation.  Marland Kitchen doxycycline (DORYX) 100 MG EC tablet Take 100 mg by mouth 2 (two) times daily.  Marland Kitchen guaiFENesin (MUCINEX) 600 MG 12 hr tablet Take 600 mg 2 (two) times daily as needed by mouth for cough or to loosen phlegm.   Marland Kitchen ipratropium-albuterol (DUONEB) 0.5-2.5 (3) MG/3ML SOLN Take 3 mLs every 6 (six) hours as needed by nebulization (for shortness of breath).  . magnesium hydroxide (MILK OF MAGNESIA) 400 MG/5ML suspension Take 30 mLs by mouth daily as needed for mild constipation. If no relief try a Fleets enema, after 2 days.  . Menthol, Topical Analgesic, (BIOFREEZE) 4 % GEL Apply small to back as needed for pain  . omeprazole (PRILOSEC) 20 MG capsule Take 40 mg by mouth daily.   . polyethylene glycol (MIRALAX / GLYCOLAX) packet Take 17 g by mouth daily.  . Probiotic Product (RISA-BID PROBIOTIC PO) Take 1 tablet by mouth twice a day  . Propylene Glycol 0.6 % SOLN Apply 1 drop to both eyes twice a day  . rOPINIRole (REQUIP) 2 MG tablet Take 2 mg by mouth at bedtime.  . sertraline (ZOLOFT) 25 MG tablet  Take 37.5 mg by mouth at bedtime.   No facility-administered encounter medications on file as of 11/28/2017.     Activities of Daily Living In your present state of health, do you have any difficulty performing the following activities: 11/28/2017 01/02/2017  Hearing? Tempie Donning  Vision? Y Y  Difficulty concentrating or  making decisions? N N  Walking or climbing stairs? Y Y  Dressing or bathing? Y Y  Doing errands, shopping? Tempie Donning  Preparing Food and eating ? Y -  Using the Toilet? Y -  In the past six months, have you accidently leaked urine? Y -  Do you have problems with loss of bowel control? Y -  Managing your Medications? Y -  Managing your Finances? Y -  Housekeeping or managing your Housekeeping? Y -  Some recent data might be hidden    Patient Care Team: Patient, [name] as PCP - General (General Practice) Danie Binder, MD as Consulting Physician (Gastroenterology) [name], MD as Consulting Physician (Genetics)   Assessment:   This is a routine wellness examination for Patrick [name].  Exercise Activities and Dietary recommendations Current Exercise Habits: The patient does not participate in regular exercise at present, Exercise limited by: orthopedic condition(s)  Goals   None     Fall Risk Fall Risk  11/28/2017 11/10/2016  Falls in the past year? No No   Is the patient's home free of loose throw rugs in walkways, pet beds, electrical cords, etc?   yes      Grab bars in the bathroom? yes      Handrails on the stairs?   yes      Adequate lighting?   yes  Depression Screen PHQ 2/9 Scores 11/28/2017 11/10/2016  PHQ - 2 Score 0 0    Cognitive Function     6CIT Screen 11/28/2017 11/10/2016  What Year? 4 points 0 points  What month? 3 points 3 points  What time? 0 points 0 points  Count back from 20 0 points 0 points  Months in reverse 4 points 4 points  Repeat phrase 6 points 4 points  Total Score 17 11    Immunization History  Administered Date(s)  Administered  . Influenza,inj,Quad PF,6+ Mos 12/07/2014  . Influenza-Unspecified 11/20/2013, 11/13/2015, 11/18/2016  . PPD Test 03/16/2013  . Pneumococcal-Unspecified 02/14/2009, 11/25/2015  . Tdap 11/10/2016    Qualifies for Shingles Vaccine? Not in past records  Screening Tests Health Maintenance  Topic Date Due  . PNA vac Low Risk Adult (2 of 2 - PCV13) 11/24/2016  . INFLUENZA VACCINE  11/29/2017 (Originally 09/14/2017)  . TETANUS/TDAP  11/11/2026   Cancer Screenings: Lung: Low Dose CT Chest recommended if Age 68-80 years, 30 pack-year currently smoking OR have quit w/in 15years. Patient does not qualify. Colorectal: up to date  Additional Screenings:  Hepatitis C Screening: declined  Flu vaccine due: will receive at [facility]:    I have personally reviewed and addressed the Medicare Annual Wellness questionnaire and have noted the following in the patient's chart:  A. Medical and social history B. Use of alcohol, tobacco or illicit drugs  C. Current medications and supplements D. Functional ability and status E.  Nutritional status F.  Physical activity G. Advance directives H. List of other physicians I.  Hospitalizations, surgeries, and ER visits in previous 12 months J.  [place] to include hearing, vision, cognitive, depression L. Referrals and appointments - none  In addition, I have reviewed and discussed with patient certain preventive protocols, quality metrics, and best practice recommendations. A written personalized care plan for preventive services as well as general preventive health recommendations were provided to patient.  See attached scanned questionnaire for additional information.   Signed,   Tyson Dense, RN Nurse Health Advisor  Patient Concerns: None

## 2017-12-05 ENCOUNTER — Non-Acute Institutional Stay (SKILLED_NURSING_FACILITY): Payer: Medicare Other | Admitting: Internal Medicine

## 2017-12-05 ENCOUNTER — Encounter: Payer: Self-pay | Admitting: Internal Medicine

## 2017-12-05 DIAGNOSIS — I959 Hypotension, unspecified: Secondary | ICD-10-CM | POA: Diagnosis not present

## 2017-12-05 DIAGNOSIS — I739 Peripheral vascular disease, unspecified: Secondary | ICD-10-CM

## 2017-12-05 DIAGNOSIS — R1310 Dysphagia, unspecified: Secondary | ICD-10-CM | POA: Diagnosis not present

## 2017-12-05 DIAGNOSIS — I4891 Unspecified atrial fibrillation: Secondary | ICD-10-CM

## 2017-12-05 DIAGNOSIS — K625 Hemorrhage of anus and rectum: Secondary | ICD-10-CM

## 2017-12-05 DIAGNOSIS — D509 Iron deficiency anemia, unspecified: Secondary | ICD-10-CM | POA: Diagnosis not present

## 2017-12-05 NOTE — Progress Notes (Signed)
Location:    [facility] Room Number: 128/P Place of Service:  SNF (31) Provider:  Veleta Miners MD  Patient, [name]  Patient Care Team: Patient, [name] as PCP - General (General Practice) Danie Binder, MD as Consulting Physician (Gastroenterology) [name], MD as Consulting Physician [facility])  Extended Emergency Contact Information Primary Emergency Contact: Black,Shirley Address: [address]          [place], [place] [address] [name] of [place] Phone: [phone] Mobile Phone: [phone] Relation: Daughter Secondary Emergency Contact: [facility] Phone: [phone] Relation: Niece  Code Status:  DNR Goals of care: Advanced Directive information Advanced Directives 12/05/2017  Does Patient Have a Medical Advance Directive? Yes  Type of Advance Directive Out of facility DNR (pink MOST or yellow form)  Does patient want to make changes to medical advance directive? No - Patient declined  Copy of [place] in Chart? No - copy requested  Pre-existing out of facility DNR order (yellow form or pink MOST form) -     Chief Complaint  Patient presents with  . Medical Management of Chronic Issues    Routine visit of medical management    HPI:  Pt is a 84 y.o. male seen today for medical management of chronic diseases.    Patient Is Long term resident of facility and has h/o HTN, CHF, Depression, Arthritis, Anemia, GERD, Chronic Foley Cathter due to Urinary retention.PVD ABI in 2015 refused Aggressive approach, Aspiration Pneumonia, Dysphagia, Gout, paroxysmal Atrial Fibrillation not on any anticoagulation Patient is doing well in facility. His room was changed recently and he has been little confused recently. But it seems he is getting use to it. Appetite stays good. His weight was 158 lbs this month which is down from 165 but per nurses he is still eating good. No New Nursing issues. Patient did not  have any complains.  Past Medical History:  Diagnosis Date  . Anxiety   . Benign prostatic hyperplasia   . Bilateral foot pain   . CHF (congestive heart failure) ([place])   . Depression   . Diverticulitis   . DJD (degenerative joint disease), cervical   . DNR (do not resuscitate)   . Esophageal dysmotility    age related per BPE 04/2015  . Esophageal stricture   . Falling   . GERD (gastroesophageal reflux disease)   . Gout   . Hiatal hernia   . History of recurrent TIAs   . HTN (hypertension)   . Hyperlipidemia   . Inguinal hernia   . Ischemic heart disease   . Kidney stone   . Osteoarthritis    bilat knees  . Pleural effusion   . Reflux   . Restless leg syndrome   . Right hip pain   . Skin cancer, basal cell   . Urinary retention    Past Surgical History:  Procedure Laterality Date  . ESOPHAGEAL DILATION    . EXPLORATORY LAPAROTOMY W/ BOWEL RESECTION    . FRACTURE SURGERY    . HEMIARTHROPLASTY HIP     Dr. Aline Brochure  . intestines      Allergies  Allergen Reactions  . Celebrex [Celecoxib] Other (See Comments)    Unknown; patient can't remember  . Codeine Other (See Comments)    nausea    Outpatient Encounter Medications as of 12/05/2017  Medication Sig  . acetaminophen (TYLENOL) 325 MG tablet Take 650 mg by mouth every 4 (four) hours as  needed.  Marland Kitchen allopurinol (ZYLOPRIM) 100 MG tablet Take 100 mg by mouth daily.  . Cholecalciferol (VITAMIN D) 2000 UNITS CAPS Take 1 capsule by mouth daily.  Marland Kitchen docusate sodium (COLACE) 100 MG capsule Take 1 capsule (100 mg total) by mouth 2 (two) times daily. For constipation.  Marland Kitchen guaiFENesin (MUCINEX) 600 MG 12 hr tablet Take 600 mg 2 (two) times daily as needed by mouth for cough or to loosen phlegm.   Marland Kitchen ipratropium-albuterol (DUONEB) 0.5-2.5 (3) MG/3ML SOLN Take 3 mLs every 6 (six) hours as needed by nebulization (for shortness of breath).  . magnesium hydroxide (MILK OF MAGNESIA) 400 MG/5ML suspension Take 30 mLs by mouth daily as  needed for mild constipation. If no relief try a Fleets enema, after 2 days.  . Menthol, Topical Analgesic, (BIOFREEZE) 4 % GEL Apply small to back as needed for pain  . omeprazole (PRILOSEC) 20 MG capsule Take 40 mg by mouth daily.   . polyethylene glycol (MIRALAX / GLYCOLAX) packet Take 17 g by mouth daily.  Marland Kitchen Propylene Glycol 0.6 % SOLN Apply 1 drop to both eyes twice a day  . rOPINIRole (REQUIP) 2 MG tablet Take 2 mg by mouth at bedtime.  . sertraline (ZOLOFT) 25 MG tablet Take 37.5 mg by mouth at bedtime.  . [DISCONTINUED] doxycycline (DORYX) 100 MG EC tablet Take 100 mg by mouth 2 (two) times daily.  . [DISCONTINUED] Probiotic Product (RISA-BID PROBIOTIC PO) Take 1 tablet by mouth twice a day   No facility-administered encounter medications on file as of 12/05/2017.      Review of Systems  Review of Systems  Constitutional: Negative for activity change, appetite change, chills, diaphoresis, fatigue and fever.  HENT: Negative for mouth sores, postnasal drip, rhinorrhea, sinus pain and sore throat.   Respiratory: Negative for apnea, cough, chest tightness, shortness of breath and wheezing.   Cardiovascular: Negative for chest pain, palpitations and leg swelling.  Gastrointestinal: Negative for abdominal distention, abdominal pain, constipation, diarrhea, nausea and vomiting.  Genitourinary: Negative for dysuria and frequency.  Musculoskeletal: Negative for arthralgias, joint swelling and myalgias.  Skin: Negative for rash.  Neurological: Negative for dizziness, syncope, weakness, light-headedness and numbness.  Psychiatric/Behavioral: Negative for behavioral problems, confusion and sleep disturbance.     Immunization History  Administered Date(s) Administered  . Influenza,inj,Quad PF,6+ Mos 12/07/2014  . Influenza-Unspecified 11/20/2013, 11/13/2015, 11/18/2016  . PPD Test 03/16/2013  . Pneumococcal-Unspecified 02/14/2009, 11/25/2015  . Tdap 11/10/2016   Pertinent  Health  Maintenance Due  Topic Date Due  . INFLUENZA VACCINE  Completed  . PNA vac Low Risk Adult  Completed   Fall Risk  11/28/2017 11/10/2016  Falls in the past year? No No   Functional Status Survey:    Vitals:   12/05/17 0936  BP: 130/70  Pulse: 68  Resp: 17  Temp: 97.9 F (36.6 C)  TempSrc: Oral  SpO2: 95%  Weight: 158 lb 9.6 oz (71.9 kg)  Height: 6' (1.829 m)   Body mass index is 21.51 kg/m. Physical Exam  Constitutional: He is oriented to person, place, and time. He appears well-developed and well-nourished.  HENT:  Head: Normocephalic.  Mouth/Throat: Oropharynx is clear and moist.  Eyes: Pupils are equal, round, and reactive to light.  Cardiovascular: Normal rate and regular rhythm.  Pulmonary/Chest: Effort normal and breath sounds normal.  Abdominal: Soft. Bowel sounds are normal.  Musculoskeletal: He exhibits no edema.  Patient continues to have 2 sores on his Toes in right Foot but they seem  to be chronic and not infected or tender  Neurological: He is alert and oriented to person, place, and time.  Skin: Skin is warm and dry.  Psychiatric: He has a normal mood and affect. His behavior is normal. Thought content normal.    Labs reviewed: Recent Labs    09/30/17 0830 10/05/17 0722 11/21/17 0430  NA 136 135 134*  K 3.9 4.0 4.5  CL 103 102 101  CO2 24 26 25   GLUCOSE 98 100* 88  BUN 14 15 11   CREATININE 1.04 1.06 1.12  CALCIUM 8.6* 8.2* 8.2*   Recent Labs    01/02/17 1613 05/16/17 1255  AST 26 17  ALT 14* 10*  ALKPHOS 87 102  BILITOT 0.8 0.4  PROT 7.4 7.1  ALBUMIN 3.5 3.2*   Recent Labs    09/30/17 0830 10/05/17 0722 11/21/17 0430  WBC 8.6 6.7 6.4  NEUTROABS 5.1 3.7 3.6  HGB 10.6* 9.6* 9.6*  HCT 33.6* 31.4* 31.0*  MCV 83.8 84.9 82.4  PLT 180 212 203   Lab Results  Component Value Date   TSH 1.323 04/13/2016   Lab Results  Component Value Date   HGBA1C 6.1 (H) 11/12/2014   No results found for: CHOL, HDL, LDLCALC, LDLDIRECT, TRIG,  CHOLHDL  Significant Diagnostic Results in last 30 days:  No results found.  Assessment/Plan  Hypotension Patient had some episodes of Low SBP of 90 But it is resolved now. Patient stays asymtomatic Rectal bleeding Noticed by staff last month and he was occult positive Aspirin was stopped and was started on Protonix It has mostly resolved and Hgb is stable Restart Baby Aspirin Family did not want anything Aggressive  Atrial fibrillation with RVRspontaneously resolved Patient had one episode in the hospital when he was admitted for Pneumonia. No Anticoagulation due to his age and frailty. Continue on baby aspirin. No more episodes in the facility. Dysphagia On modified diet Though patient is not Compliant PVD Patient refuses further work up. Continue on aspirin D/w the Wound care nurse for 2 sores on Right leg. Will continue to monitor GERD On Prilosec and now doing well.  Iron deficiency anemia,  Hgb Stable No aggressive work up due to his age. Gout Has not have Toe pain since he has been on Allopurinal Depression Doing wellOn Zoloft  Osteoarthritis Pain controlled on Tylenol.  Restless legs Doing well on Requip  Urinary Retention Continue Foley Cathter.     Family/ staff Communication:   Labs/tests ordered:

## 2017-12-11 DIAGNOSIS — H04123 Dry eye syndrome of bilateral lacrimal glands: Secondary | ICD-10-CM | POA: Diagnosis not present

## 2017-12-11 DIAGNOSIS — H26491 Other secondary cataract, right eye: Secondary | ICD-10-CM | POA: Diagnosis not present

## 2017-12-11 DIAGNOSIS — D3132 Benign neoplasm of left choroid: Secondary | ICD-10-CM | POA: Diagnosis not present

## 2017-12-11 DIAGNOSIS — Z961 Presence of intraocular lens: Secondary | ICD-10-CM | POA: Diagnosis not present

## 2018-01-22 ENCOUNTER — Non-Acute Institutional Stay (SKILLED_NURSING_FACILITY): Payer: Medicare Other | Admitting: Internal Medicine

## 2018-01-22 ENCOUNTER — Encounter: Payer: Self-pay | Admitting: Internal Medicine

## 2018-01-22 DIAGNOSIS — F329 Major depressive disorder, single episode, unspecified: Secondary | ICD-10-CM

## 2018-01-22 DIAGNOSIS — M1712 Unilateral primary osteoarthritis, left knee: Secondary | ICD-10-CM | POA: Diagnosis not present

## 2018-01-22 DIAGNOSIS — I4891 Unspecified atrial fibrillation: Secondary | ICD-10-CM | POA: Diagnosis not present

## 2018-01-22 DIAGNOSIS — T148XXA Other injury of unspecified body region, initial encounter: Secondary | ICD-10-CM

## 2018-01-22 DIAGNOSIS — K625 Hemorrhage of anus and rectum: Secondary | ICD-10-CM

## 2018-01-22 DIAGNOSIS — I509 Heart failure, unspecified: Secondary | ICD-10-CM

## 2018-01-22 DIAGNOSIS — F32A Depression, unspecified: Secondary | ICD-10-CM

## 2018-01-22 DIAGNOSIS — I959 Hypotension, unspecified: Secondary | ICD-10-CM | POA: Diagnosis not present

## 2018-01-22 NOTE — Progress Notes (Signed)
Location:    [facility] Room Number: 128/P Place of Service:  SNF (31) Provider:  Granville Lewis  Patient, [name]  Patient Care Team: Patient, [name] as PCP - General (General Practice) Danie Binder, MD as Consulting Physician (Gastroenterology) [name], MD as Consulting Physician [facility])  Extended Emergency Contact Information Primary Emergency Contact: Black,Shirley Address: [address]          [place], [place] [address] [name] of [place] Phone: [phone] Mobile Phone: [phone] Relation: Daughter Secondary Emergency Contact: [facility] Phone: [phone] Relation: Niece  Code Status:  DNR Goals of care: Advanced Directive information Advanced Directives 01/22/2018  Does Patient Have a Medical Advance Directive? Yes  Type of Advance Directive Out of facility DNR (pink MOST or yellow form)  Does patient want to make changes to medical advance directive? No - Patient declined  Copy of [place] in Chart? No - copy requested  Pre-existing out of facility DNR order (yellow form or pink MOST form) -     Chief Complaint  Patient presents with  . Acute Visit    Skin tear to left side of neck  Medical management of chronic medical conditions including hypertension- CHF- depression-osteoarthritis-urinary retention status post Foley catheter-as well as peripheral vascular disease history of dysphasia with aspiration pneumonia as well as atrial fibrillation and restless legs.    HPI:  Pt is a 84 y.o. male seen today for an acute visit for a follow-up of a skin tear to his neck area on the left.  As well as medical management of chronic medical conditions as noted above.  Patient apparently sustained a skin tear to the left side of his neck during personal care over the weekend- nursing did apply Steri-Strips to the area and this point area looks stable per nursing the wound edges now are  disappearing- and appears to be healing unremarkably area is currently covered with Steri-Strips I do not see evidence of drainage bleeding or infection.  His other medical issues appear to be stable as well.  His weight is 155.8 which is down a couple pounds over the past 2 months-per nursing eats pretty well and is not complain of dysphasia-he does have a history of GERD and continues on a PPI.  He also has a history of peripheral vascular disease but does not want any aggressive work-up or studies on this he is on aspirin he has had sores before on his feet but these appear to be stabilized currently.  Regards atrial fibrillation apparently this was transitory during hospitalization he is on aspirin and appears to be rate controlled from what I hear it is sinus rhythm today.  This is complicated somewhat with a history of rectal bleeding earlier this year he did have some occult positive stools although the last test was negative- he been taken off his aspirin for a while but this is been restarted and appears to be tolerating this well will update a CBC to ensure stability of his hemoglobin.  There have been no reports of witnessed rectal bleeding here in some time.  He also has a history of dysphasia with aspiration pneumonia he is on a modified diet and and this has stabilized now for some time which is encouraging he does have orders for nebulizers and Mucinex as needed.  He also continues with a chronic Foley catheter for history of urinary retention and this appears to be well tolerated.  He  does have diffuse arthritic changes especially of his legs with stiffness but he does okay with Tylenol as needed for pain.  Currently he is sitting in his wheelchair comfortably he has covering and Steri-Strips over his skin tear-but he is not really complaining of any pain with this     Past Medical History:  Diagnosis Date  . Anxiety   . Benign prostatic hyperplasia   . Bilateral foot  pain   . CHF (congestive heart failure) ([place])   . Depression   . Diverticulitis   . DJD (degenerative joint disease), cervical   . DNR (do not resuscitate)   . Esophageal dysmotility    age related per BPE 04/2015  . Esophageal stricture   . Falling   . GERD (gastroesophageal reflux disease)   . Gout   . Hiatal hernia   . History of recurrent TIAs   . HTN (hypertension)   . Hyperlipidemia   . Inguinal hernia   . Ischemic heart disease   . Kidney stone   . Osteoarthritis    bilat knees  . Pleural effusion   . Reflux   . Restless leg syndrome   . Right hip pain   . Skin cancer, basal cell   . Urinary retention    Past Surgical History:  Procedure Laterality Date  . ESOPHAGEAL DILATION    . EXPLORATORY LAPAROTOMY W/ BOWEL RESECTION    . FRACTURE SURGERY    . HEMIARTHROPLASTY HIP     Dr. Aline Brochure  . intestines      Allergies  Allergen Reactions  . Celebrex [Celecoxib] Other (See Comments)    Unknown; patient can't remember  . Codeine Other (See Comments)    nausea    Outpatient Encounter Medications as of 01/22/2018  Medication Sig  . acetaminophen (TYLENOL) 325 MG tablet Take 650 mg by mouth every 4 (four) hours as needed.  Marland Kitchen allopurinol (ZYLOPRIM) 100 MG tablet Take 100 mg by mouth daily.  Marland Kitchen aspirin 81 MG chewable tablet Chew 81 mg by mouth daily.  . Cholecalciferol (VITAMIN D) 2000 UNITS CAPS Take 1 capsule by mouth daily.  Marland Kitchen docusate sodium (COLACE) 100 MG capsule Take 1 capsule (100 mg total) by mouth 2 (two) times daily. For constipation.  Marland Kitchen guaiFENesin (MUCINEX) 600 MG 12 hr tablet Take 600 mg 2 (two) times daily as needed by mouth for cough or to loosen phlegm.   Marland Kitchen ipratropium-albuterol (DUONEB) 0.5-2.5 (3) MG/3ML SOLN Take 3 mLs every 6 (six) hours as needed by nebulization (for shortness of breath).  . magnesium hydroxide (MILK OF MAGNESIA) 400 MG/5ML suspension Take 30 mLs by mouth daily as needed for mild constipation. If no relief try a Fleets enema,  after 2 days.  . Menthol, Topical Analgesic, (BIOFREEZE) 4 % GEL Apply small to back as needed for pain  . omeprazole (PRILOSEC) 20 MG capsule Take 40 mg by mouth daily.   . polyethylene glycol (MIRALAX / GLYCOLAX) packet Take 17 g by mouth daily.  Marland Kitchen Propylene Glycol 0.6 % SOLN Apply 1 drop to both eyes twice a day  . rOPINIRole (REQUIP) 2 MG tablet Take 2 mg by mouth at bedtime.  . sertraline (ZOLOFT) 25 MG tablet Take 37.5 mg by mouth at bedtime.   No facility-administered encounter medications on file as of 01/22/2018.     Review of Systems   General is not complaining of any fever chills his weight appears to be relatively stable.  Skin is not complaining of rashes or itching  he does have fragile skin with some chronic bruising and does have a skin tear to neck area as noted above.  Head ears eyes nose mouth and throat is not complain of visual changes or sore throat he has prescription lenses.  Respiratory denies to being short of breath or having a cough.  Cardiac denies chest pain has trace edema.  GI is not complaining of abdominal discomfort nausea vomiting diarrhea constipation at this time.  GU has an indwelling Foley catheter draining amber-colored urine is not complaining of any dysuria.  Musculoskeletal has diffuse arthritic changes but is not really complaining of pain today appears to be doing well with the Tylenol.  Neurologic is not complaining of dizziness headache numbness.  And psych he is on Zoloft for depression but this appears to be quite well controlled at one point express depressive thoughts but this was somewhat transitory and since he has been on the Zoloft appears to be doing quite well.    Immunization History  Administered Date(s) Administered  . Influenza,inj,Quad PF,6+ Mos 12/07/2014  . Influenza-Unspecified 11/20/2013, 11/13/2015, 11/18/2016  . PPD Test 03/16/2013  . Pneumococcal-Unspecified 02/14/2009, 11/25/2015  . Tdap 11/10/2016    Pertinent  Health Maintenance Due  Topic Date Due  . INFLUENZA VACCINE  Completed  . PNA vac Low Risk Adult  Completed   Fall Risk  11/28/2017 11/10/2016  Falls in the past year? No No   Functional Status Survey:    Vitals:   01/22/18 1419  BP: 113/75  Pulse: 89  Resp: (!) 24  Temp: 97.8 F (36.6 C)  TempSrc: Oral  Weight is relatively stable at 155.8 pounds  Physical Exam  In general this is a very pleasant elderly male who appears well-nourished and well-developed considering his advanced age.  His skin is warm and dry he does have some chronic violaceous bruising changes diffuse he does have somewhat of a large area on his left neck but this does not appear to be cellulitic he has Steri-Strips applied over the skin tear I do not see any drainage or bleeding or concerning erythema but this will have to be watched---per  nursing the wound edges are well approximated and appear to be healing  Eyes visual acuity appears to be intact sclera and conjunctive are clear he has prescription lenses.  Oropharynx clear mucous membranes moist.  Chest is clear to auscultation there is no labored breathing air entry is somewhat shallow.  Heart is regular rate and rhythm with occasional irregular beats he has quite mild minimal lower extremity edema bilaterally.  His abdomen is soft nontender with positive bowel sounds he does have a large abdominal hernia which is baseline and nontender.  Musculoskeletal largely ambulates in a wheelchair is able to move all extremities x4 at baseline with some generalized stiffness he does have diffuse arthritic changes.  On his feet bilaterally there are some scabbed areas these do not really appear to be anything acute or infected.  Neurologic is grossly intact he is alert could not appreciate lateralizing findings.  Psych he is alert and oriented very pleasant and appropriate he is up on current events actually was watching the impeachment hearings  on TV  Labs reviewed: Recent Labs    09/30/17 0830 10/05/17 0722 11/21/17 0430  NA 136 135 134*  K 3.9 4.0 4.5  CL 103 102 101  CO2 24 26 25   GLUCOSE 98 100* 88  BUN 14 15 11   CREATININE 1.04 1.06 1.12  CALCIUM 8.6* 8.2*  8.2*   Recent Labs    05/16/17 1255  AST 17  ALT 10*  ALKPHOS 102  BILITOT 0.4  PROT 7.1  ALBUMIN 3.2*   Recent Labs    09/30/17 0830 10/05/17 0722 11/21/17 0430  WBC 8.6 6.7 6.4  NEUTROABS 5.1 3.7 3.6  HGB 10.6* 9.6* 9.6*  HCT 33.6* 31.4* 31.0*  MCV 83.8 84.9 82.4  PLT 180 212 203   Lab Results  Component Value Date   TSH 1.323 04/13/2016   Lab Results  Component Value Date   HGBA1C 6.1 (H) 11/12/2014   No results found for: CHOL, HDL, LDLCALC, LDLDIRECT, TRIG, CHOLHDL  Significant Diagnostic Results in last 30 days:  No results found.  Assessment/Plan  #1- skin tear-as noted above will continue to monitor this nursing staff has applied Steri-Strips I do not see any drainage or bleeding or sign of infection he does have a large bruise in the area but he has bruising somewhat diffuse and chronic with his fragile skin- continue to monitor.  2.  History of hypo--hypertension-this appears stable blood pressure today 113/75 see previous readings 117/64-105/60 he is not symptomatic of hypotension he is not on any blood pressure medication or beta-blocker.  This point monitor.  3.  History of rectal bleeding apparently this was transitory around September he did have occult positive stools at that time but most recent 1 in October was negative- aspirin was stopped for a while started on a proton pump inhibitor hemoglobin has shown stability aspirin has been restarted at low dose-- family did not desire aggressive intervention will update a CBC to ensure stability  #4 atrial fibrillation with rapid ventricular rate apparently this spontaneously resolved during the hospitalization this was transitory he does have some irregular beats today but  underlying rhythm appears to be fairly regular- not on any anticoagulation low-dose aspirin.  5.-  History of dysphasia with aspiration pneumonia risk this is been stable he is on a modified diet lung exam was benign today this is been stable now for some time but he remains at risk he does have duo nebs and Robitussin if needed.  6 history of peripheral vascular disease he is now on low-dose aspirin he does not appear to have any active wounds at this time on his feet which is encouraging- he does not desire aggressive work-up or further studies.  7.  History of urinary retention he does have chronic Foley catheter this appears to be stable he is had one now for some time has tolerated it well.  8.  History of osteoarthritis this appears stable he does have diffuse arthritic changes which are largely age-related he is on Tylenol if needed as well as topical Biofreeze.  9.  Depression this is stable on Zoloft at 1 point he did express some depressive thoughts but ever since he was started on Zoloft he is not really exhibited this continues to be in good spirits engaged in current events is very encouraging.  10.  History of gout he used to have flares but ever since he got strongly on allopurinol this appears to have stabilized his pain appears to be controlled.  11.  History of restless legs he has been started on Requip nightly appears to tolerate this nursing is not noted any concerns for sedation.  12.  History of CHF his edema appears to be stable weight appears to be relatively stable--he is not complaining of any cough or increased shortness of breath.  Again will update a  CBC with his history of rectal bleeding as well as the metabolic  panel to keep an eye on his electrolytes and renal function he had creatinine of 1.12 back in October again will update this  Also continue to monitor skin tear on his neck at this point appears to be stable.  [id number] note greater than 40 minutes  spent assessing patient discussing his status with nursing staff reviewing his chart and labs- coordinating and formulating a plan of care for numerous diagnoses- of note greater than 50% of time spent coordinating a plan of care with input as noted above

## 2018-01-23 ENCOUNTER — Encounter (HOSPITAL_COMMUNITY)
Admission: RE | Admit: 2018-01-23 | Discharge: 2018-01-23 | Disposition: A | Discharge status: Home / Self Care | Payer: Medicare Other | Source: Skilled Nursing Facility | Attending: Internal Medicine | Admitting: Internal Medicine

## 2018-01-23 DIAGNOSIS — I4891 Unspecified atrial fibrillation: Secondary | ICD-10-CM | POA: Insufficient documentation

## 2018-01-23 DIAGNOSIS — M7981 Nontraumatic hematoma of soft tissue: Secondary | ICD-10-CM | POA: Insufficient documentation

## 2018-01-23 DIAGNOSIS — J42 Unspecified chronic bronchitis: Secondary | ICD-10-CM | POA: Insufficient documentation

## 2018-01-23 DIAGNOSIS — I259 Chronic ischemic heart disease, unspecified: Secondary | ICD-10-CM | POA: Insufficient documentation

## 2018-01-23 DIAGNOSIS — R195 Other fecal abnormalities: Secondary | ICD-10-CM | POA: Diagnosis not present

## 2018-01-23 DIAGNOSIS — I509 Heart failure, unspecified: Secondary | ICD-10-CM | POA: Insufficient documentation

## 2018-01-23 LAB — CBC WITH DIFFERENTIAL/PLATELET
Abs Immature Granulocytes: 0.02 10*3/uL (ref 0.00–0.07)
BASOS ABS: 0 10*3/uL (ref 0.0–0.1)
BASOS PCT: 0 %
EOS ABS: 0.3 10*3/uL (ref 0.0–0.5)
EOS PCT: 5 %
HCT: 34.3 % — ABNORMAL LOW (ref 39.0–52.0)
Hemoglobin: 10.3 g/dL — ABNORMAL LOW (ref 13.0–17.0)
IMMATURE GRANULOCYTES: 0 %
Lymphocytes Relative: 27 %
Lymphs Abs: 1.8 10*3/uL (ref 0.7–4.0)
MCH: 25.4 pg — ABNORMAL LOW (ref 26.0–34.0)
MCHC: 30 g/dL (ref 30.0–36.0)
MCV: 84.7 fL (ref 80.0–100.0)
Monocytes Absolute: 0.9 10*3/uL (ref 0.1–1.0)
Monocytes Relative: 14 %
NEUTROS PCT: 54 %
Neutro Abs: 3.7 10*3/uL (ref 1.7–7.7)
PLATELETS: 312 10*3/uL (ref 150–400)
RBC: 4.05 MIL/uL — AB (ref 4.22–5.81)
RDW: 16.5 % — AB (ref 11.5–15.5)
WBC: 6.9 10*3/uL (ref 4.0–10.5)
nRBC: 0 % (ref 0.0–0.2)

## 2018-01-23 LAB — BASIC METABOLIC PANEL
Anion gap: 9 (ref 5–15)
BUN: 15 mg/dL (ref 8–23)
CALCIUM: 8.5 mg/dL — AB (ref 8.9–10.3)
CO2: 24 mmol/L (ref 22–32)
CREATININE: 1.1 mg/dL (ref 0.61–1.24)
Chloride: 100 mmol/L (ref 98–111)
GFR calc Af Amer: >60 mL/min (ref >60)
GFR, EST NON AFRICAN AMERICAN: 54 mL/min — AB (ref >60)
Glucose, Bld: 89 mg/dL (ref 70–99)
Potassium: 4 mmol/L (ref 3.5–5.1)
SODIUM: 133 mmol/L — AB (ref 135–145)

## 2018-02-05 ENCOUNTER — Encounter: Payer: Self-pay | Admitting: Internal Medicine

## 2018-02-05 ENCOUNTER — Non-Acute Institutional Stay (SKILLED_NURSING_FACILITY): Payer: Medicare Other | Admitting: Internal Medicine

## 2018-02-05 DIAGNOSIS — T148XXA Other injury of unspecified body region, initial encounter: Secondary | ICD-10-CM

## 2018-02-05 DIAGNOSIS — M1712 Unilateral primary osteoarthritis, left knee: Secondary | ICD-10-CM

## 2018-02-05 DIAGNOSIS — R1310 Dysphagia, unspecified: Secondary | ICD-10-CM | POA: Diagnosis not present

## 2018-02-05 NOTE — Progress Notes (Signed)
Location:    [facility] Room Number: 128/P Place of Service:  SNF (31) Provider:  Granville Lewis  Patient, [name]  Patient Care Team: Patient, [name] as PCP - General (General Practice) Danie Binder, MD as Consulting Physician (Gastroenterology) [name], MD as Consulting Physician [facility])  Extended Emergency Contact Information Primary Emergency Contact: Black,Shirley Address: [address]          [place], [place] [address] [name] of [place] Phone: [phone] Mobile Phone: [phone] Relation: Daughter Secondary Emergency Contact: [facility] Phone: [phone] Relation: Niece  Code Status:  DNR Goals of care: Advanced Directive information Advanced Directives 02/05/2018  Does Patient Have a Medical Advance Directive? Yes  Type of Advance Directive Out of facility DNR (pink MOST or yellow form)  Does patient want to make changes to medical advance directive? No - Patient declined  Copy of [place] in Chart? No - copy requested  Pre-existing out of facility DNR order (yellow form or pink MOST form) -     Chief Complaint  Patient presents with  . Acute Visit    Bruise on chest    HPI:  Pt is a 84 y.o. male seen today for an acute visit for apparent small bruise on his thorax.  Apparently he was getting personal care with the lift in his legs became weaker feet slipped slightly and he apparently sustained a small bruise to his right thorax.  He is not really complaining of any pain with this today except when I palpated he does complain of some soreness.  He is a long-term resident of the facility and has numerous issues including CHF hypertension depression osteoarthritis urinary retention with a Foley catheter-also has peripheral vascular disease as well as aspiration pneumonia atrial fibrillation restless legs and a history of dysphasia.  Nursing feels he is complaining of having somewhat  more swallowing difficulties lately this is been a gradual progression and would most likely benefit from a speech therapy evaluation.  They also states been complaining more of acid reflux he is on Prilosec 40 mg once a day we can increase this to twice a day to see if it helps.  They also feel at times will complain of some generalized osteoarthritic pain he is on Tylenol-at one point had been on Norco to some extent but it was thought this made him somewhat sleepy.  Currently he is sitting in his wheelchair comfortably is eating his dessert he says at times he does have some more difficulty swallowing  He is on a dysphasia diet.     Past Medical History:  Diagnosis Date  . Anxiety   . Benign prostatic hyperplasia   . Bilateral foot pain   . CHF (congestive heart failure) ([employer])   . Depression   . Diverticulitis   . DJD (degenerative joint disease), cervical   . DNR (do not resuscitate)   . Esophageal dysmotility    age related per BPE 04/2015  . Esophageal stricture   . Falling   . GERD (gastroesophageal reflux disease)   . Gout   . Hiatal hernia   . History of recurrent TIAs   . HTN (hypertension)   . Hyperlipidemia   . Inguinal hernia   . Ischemic heart disease   . Kidney stone   . Osteoarthritis    bilat knees  . Pleural effusion   . Reflux   . Restless leg syndrome   . Right hip  pain   . Skin cancer, basal cell   . Urinary retention    Past Surgical History:  Procedure Laterality Date  . ESOPHAGEAL DILATION    . EXPLORATORY LAPAROTOMY W/ BOWEL RESECTION    . FRACTURE SURGERY    . HEMIARTHROPLASTY HIP     Dr. Aline Brochure  . intestines      Allergies  Allergen Reactions  . Celebrex [Celecoxib] Other (See Comments)    Unknown; patient can't remember  . Codeine Other (See Comments)    nausea    Outpatient Encounter Medications as of 02/05/2018  Medication Sig  . acetaminophen (TYLENOL) 325 MG tablet Take 650 mg by mouth every 4 (four) hours as needed.    Marland Kitchen allopurinol (ZYLOPRIM) 100 MG tablet Take 100 mg by mouth daily.  Marland Kitchen aspirin 81 MG chewable tablet Chew 81 mg by mouth daily.  . Cholecalciferol (VITAMIN D) 2000 UNITS CAPS Take 1 capsule by mouth daily.  Marland Kitchen docusate sodium (COLACE) 100 MG capsule Take 1 capsule (100 mg total) by mouth 2 (two) times daily. For constipation.  Marland Kitchen guaiFENesin (MUCINEX) 600 MG 12 hr tablet Take 600 mg 2 (two) times daily as needed by mouth for cough or to loosen phlegm.   Marland Kitchen ipratropium-albuterol (DUONEB) 0.5-2.5 (3) MG/3ML SOLN Take 3 mLs every 6 (six) hours as needed by nebulization (for shortness of breath).  . magnesium hydroxide (MILK OF MAGNESIA) 400 MG/5ML suspension Take 30 mLs by mouth daily as needed for mild constipation. If no relief try a Fleets enema, after 2 days.  . Menthol, Topical Analgesic, (BIOFREEZE) 4 % GEL Apply small to back as needed for pain  . omeprazole (PRILOSEC) 20 MG capsule Take 40 mg by mouth daily.   . polyethylene glycol (MIRALAX / GLYCOLAX) packet Take 17 g by mouth daily.  Marland Kitchen Propylene Glycol 0.6 % SOLN Apply 1 drop to both eyes twice a day  . rOPINIRole (REQUIP) 2 MG tablet Take 2 mg by mouth at bedtime.  . sertraline (ZOLOFT) 25 MG tablet Take 37.5 mg by mouth at bedtime.   No facility-administered encounter medications on file as of 02/05/2018.     Review of Systems   In general is not complaining of fever chills per nursing staff he is gradually getting a bit weaker.  Skin he is not complaining of rashes or itching he does have a small bruise around his neck cannot really see 1 of the thorax however.  Head ears eyes nose mouth and throat is not complain of visual changes or sore throat.  Respiratory does not complain of a cough or shortness of breath.  Cardiac does not complain of chest pain has fairly minimal lower extremity edema.  GI per nursing seems to have a bit more gradual difficulty in swallowing not really complaining of abdominal pain nausea or  vomiting.  GU does have an indwelling Foley catheter does not complain of dysuria at this point.  Musculoskeletal does have history of diffuse osteoarthritic pain and frailty.  Neurologic is not complain of dizziness or headache or syncope.  And psych does not complain overtly depressed or anxious at first appeared to be somewhat subdued when I entered the room but as I got to talk to him appear to be doing better.      Immunization History  Administered Date(s) Administered  . Influenza,inj,Quad PF,6+ Mos 12/07/2014  . Influenza-Unspecified 11/20/2013, 11/13/2015, 11/18/2016  . PPD Test 03/16/2013  . Pneumococcal-Unspecified 02/14/2009, 11/25/2015  . Tdap 11/10/2016   Pertinent  Health Maintenance Due  Topic Date Due  . INFLUENZA VACCINE  Completed  . PNA vac Low Risk Adult  Completed   Fall Risk  11/28/2017 11/10/2016  Falls in the past year? No No   Functional Status Survey:    Vitals:   02/05/18 1459  BP: (!) 90/47  Pulse: 79  Resp: 18  Temp: 97.9 F (36.6 C)  TempSrc: Oral  SpO2: 97%  Manual blood pressure is around 110/50  Physical Exam   In general this is a pleasant elderly male in no distress appears to be somewhat frail but stable.  His skin is warm and dry he does have some bruising at the base of the anterior neck area this actually appears if anything improved from previous exam-I could not really appreciate any thorax bruising.  He does have some mild tenderness to palpation of the right thorax.  Eyes visual acuity appears to be intact he has prescription lenses.  Oropharynx is clear mucous membranes moist.  Chest is clear to auscultation with shallow air entry there is no labored breathing.  Heart is distant heart sounds from what I could ascertain regular-irregular rate and rhythm-he has quite mild lower extremity edema this is fairly minimal.  Abdomen is soft nontender with positive bowel sounds he does have a large abdominal hernia at  baseline  GU he does have a indwelling Foley catheter draining amber-colored urine.  Musculoskeletal does move all extremities x4 at baseline he has significant arthritic changes most significantly of his knees bilaterally I do not really know changes from baseline.  Neurologic is grossly intact his speech is clear could not appreciate lateralizing findings he does have weakness.  And psych he is largely alert and oriented pleasant and appropriate  Labs reviewed: Recent Labs    10/05/17 0722 11/21/17 0430 01/23/18 0700  NA 135 134* 133*  K 4.0 4.5 4.0  CL 102 101 100  CO2 26 25 24   GLUCOSE 100* 88 89  BUN 15 11 15   CREATININE 1.06 1.12 1.10  CALCIUM 8.2* 8.2* 8.5*   Recent Labs    05/16/17 1255  AST 17  ALT 10*  ALKPHOS 102  BILITOT 0.4  PROT 7.1  ALBUMIN 3.2*   Recent Labs    10/05/17 0722 11/21/17 0430 01/23/18 0700  WBC 6.7 6.4 6.9  NEUTROABS 3.7 3.6 3.7  HGB 9.6* 9.6* 10.3*  HCT 31.4* 31.0* 34.3*  MCV 84.9 82.4 84.7  PLT 212 203 312   Lab Results  Component Value Date   TSH 1.323 04/13/2016   Lab Results  Component Value Date   HGBA1C 6.1 (H) 11/12/2014   No results found for: CHOL, HDL, LDLCALC, LDLDIRECT, TRIG, CHOLHDL  Significant Diagnostic Results in last 30 days:  No results found.  Assessment/Plan  #1- history of thorax bruise-at this point will monitor the bruises more around his neck and this had been previously there--- at this point will monitor if he has significant pain consider a chest x-ray but this appears to be more situational with tenderness to palpation otherwise no discomfort suspect there may be a slight muscle strain here.  2.  History of dysphasia-he continues on a dysphasia diet- will order a speech therapy consult.  Also secondary to possibility that acid reflux may be contributing in this will increase his Prilosec to twice daily and monitor.  3.  History of osteoarthritic pain he does have an order for as needed  Tylenol but staff feels at times this is not totally  effective as he does complain of pain at times despite receiving the Tylenol- will write an order for tramadol 50 mg 1 time a day as needed for pain not relieved by Tylenol and monitor.  [id number]

## 2018-02-06 DIAGNOSIS — R279 Unspecified lack of coordination: Secondary | ICD-10-CM | POA: Diagnosis not present

## 2018-02-06 DIAGNOSIS — K219 Gastro-esophageal reflux disease without esophagitis: Secondary | ICD-10-CM | POA: Diagnosis not present

## 2018-02-06 DIAGNOSIS — R1312 Dysphagia, oropharyngeal phase: Secondary | ICD-10-CM | POA: Diagnosis not present

## 2018-02-06 DIAGNOSIS — R195 Other fecal abnormalities: Secondary | ICD-10-CM | POA: Diagnosis not present

## 2018-02-06 DIAGNOSIS — M1990 Unspecified osteoarthritis, unspecified site: Secondary | ICD-10-CM | POA: Diagnosis not present

## 2018-02-06 DIAGNOSIS — I509 Heart failure, unspecified: Secondary | ICD-10-CM | POA: Diagnosis not present

## 2018-02-06 DIAGNOSIS — M6281 Muscle weakness (generalized): Secondary | ICD-10-CM | POA: Diagnosis not present

## 2018-02-08 DIAGNOSIS — K219 Gastro-esophageal reflux disease without esophagitis: Secondary | ICD-10-CM | POA: Diagnosis not present

## 2018-02-08 DIAGNOSIS — R195 Other fecal abnormalities: Secondary | ICD-10-CM | POA: Diagnosis not present

## 2018-02-08 DIAGNOSIS — M1990 Unspecified osteoarthritis, unspecified site: Secondary | ICD-10-CM | POA: Diagnosis not present

## 2018-02-08 DIAGNOSIS — R279 Unspecified lack of coordination: Secondary | ICD-10-CM | POA: Diagnosis not present

## 2018-02-08 DIAGNOSIS — I509 Heart failure, unspecified: Secondary | ICD-10-CM | POA: Diagnosis not present

## 2018-02-08 DIAGNOSIS — M6281 Muscle weakness (generalized): Secondary | ICD-10-CM | POA: Diagnosis not present

## 2018-02-12 DIAGNOSIS — I509 Heart failure, unspecified: Secondary | ICD-10-CM | POA: Diagnosis not present

## 2018-02-12 DIAGNOSIS — R195 Other fecal abnormalities: Secondary | ICD-10-CM | POA: Diagnosis not present

## 2018-02-12 DIAGNOSIS — R279 Unspecified lack of coordination: Secondary | ICD-10-CM | POA: Diagnosis not present

## 2018-02-12 DIAGNOSIS — M1990 Unspecified osteoarthritis, unspecified site: Secondary | ICD-10-CM | POA: Diagnosis not present

## 2018-02-12 DIAGNOSIS — K219 Gastro-esophageal reflux disease without esophagitis: Secondary | ICD-10-CM | POA: Diagnosis not present

## 2018-02-12 DIAGNOSIS — M6281 Muscle weakness (generalized): Secondary | ICD-10-CM | POA: Diagnosis not present

## 2018-02-14 DIAGNOSIS — M6281 Muscle weakness (generalized): Secondary | ICD-10-CM | POA: Diagnosis not present

## 2018-02-14 DIAGNOSIS — K219 Gastro-esophageal reflux disease without esophagitis: Secondary | ICD-10-CM | POA: Diagnosis not present

## 2018-02-14 DIAGNOSIS — R195 Other fecal abnormalities: Secondary | ICD-10-CM | POA: Diagnosis not present

## 2018-02-14 DIAGNOSIS — R1312 Dysphagia, oropharyngeal phase: Secondary | ICD-10-CM | POA: Diagnosis not present

## 2018-02-14 DIAGNOSIS — I509 Heart failure, unspecified: Secondary | ICD-10-CM | POA: Diagnosis not present

## 2018-02-14 DIAGNOSIS — R279 Unspecified lack of coordination: Secondary | ICD-10-CM | POA: Diagnosis not present

## 2018-02-14 DIAGNOSIS — M1990 Unspecified osteoarthritis, unspecified site: Secondary | ICD-10-CM | POA: Diagnosis not present

## 2018-02-15 DIAGNOSIS — M1990 Unspecified osteoarthritis, unspecified site: Secondary | ICD-10-CM | POA: Diagnosis not present

## 2018-02-15 DIAGNOSIS — K219 Gastro-esophageal reflux disease without esophagitis: Secondary | ICD-10-CM | POA: Diagnosis not present

## 2018-02-15 DIAGNOSIS — I509 Heart failure, unspecified: Secondary | ICD-10-CM | POA: Diagnosis not present

## 2018-02-15 DIAGNOSIS — R279 Unspecified lack of coordination: Secondary | ICD-10-CM | POA: Diagnosis not present

## 2018-02-15 DIAGNOSIS — M6281 Muscle weakness (generalized): Secondary | ICD-10-CM | POA: Diagnosis not present

## 2018-02-15 DIAGNOSIS — R195 Other fecal abnormalities: Secondary | ICD-10-CM | POA: Diagnosis not present

## 2018-03-22 ENCOUNTER — Encounter: Payer: Self-pay | Admitting: Internal Medicine

## 2018-03-22 ENCOUNTER — Non-Acute Institutional Stay (SKILLED_NURSING_FACILITY): Payer: Medicare Other | Admitting: Internal Medicine

## 2018-03-22 DIAGNOSIS — R634 Abnormal weight loss: Secondary | ICD-10-CM

## 2018-03-22 DIAGNOSIS — M2011 Hallux valgus (acquired), right foot: Secondary | ICD-10-CM | POA: Diagnosis not present

## 2018-03-22 DIAGNOSIS — M2012 Hallux valgus (acquired), left foot: Secondary | ICD-10-CM | POA: Diagnosis not present

## 2018-03-22 DIAGNOSIS — M2042 Other hammer toe(s) (acquired), left foot: Secondary | ICD-10-CM | POA: Diagnosis not present

## 2018-03-22 DIAGNOSIS — B351 Tinea unguium: Secondary | ICD-10-CM | POA: Diagnosis not present

## 2018-03-22 DIAGNOSIS — T148XXA Other injury of unspecified body region, initial encounter: Secondary | ICD-10-CM

## 2018-03-22 DIAGNOSIS — I739 Peripheral vascular disease, unspecified: Secondary | ICD-10-CM | POA: Diagnosis not present

## 2018-03-22 DIAGNOSIS — I4891 Unspecified atrial fibrillation: Secondary | ICD-10-CM

## 2018-03-22 DIAGNOSIS — I509 Heart failure, unspecified: Secondary | ICD-10-CM | POA: Diagnosis not present

## 2018-03-22 DIAGNOSIS — L603 Nail dystrophy: Secondary | ICD-10-CM | POA: Diagnosis not present

## 2018-03-22 DIAGNOSIS — M2041 Other hammer toe(s) (acquired), right foot: Secondary | ICD-10-CM | POA: Diagnosis not present

## 2018-03-22 NOTE — Progress Notes (Signed)
Location:    [place] Room Number: 148/W Place of Service:  SNF (31) Provider:  Granville Lewis  Patient, [name]  Patient Care Team: Patient, [name] as PCP - General (General Practice) Danie Binder, MD as Consulting Physician (Gastroenterology) [name], MD as Consulting Physician [facility])  Extended Emergency Contact Information Primary Emergency Contact: Black,Shirley Address: [address]          [place], [place] [address] [name] of [place] Phone: [phone] Mobile Phone: [phone] Relation: Daughter Secondary Emergency Contact: [facility] Phone: [phone] Relation: Niece  Code Status:  DNR Goals of care: Advanced Directive information Advanced Directives 03/22/2018  Does Patient Have a Medical Advance Directive? Yes  Type of Advance Directive Out of facility DNR (pink MOST or yellow form)  Does patient want to make changes to medical advance directive? No - Patient declined  Copy of [place] in Chart? No - copy requested  Pre-existing out of facility DNR order (yellow form or pink MOST form) -     Chief Complaint  Patient presents with  . Acute Visit    Bruise on neck    HPI:  Pt is a 85 y.o. male seen today for an acute visit for a bruise on the right side of his neck.  Unclear etiology he did have a recent fall but apparently did not sustain any injuries.  However this morning peers to have a bruise on his right lateral neck a small amount of bleeding.  At one point he was seen for something similar on the left side of his neck and this was thought to be secondary to shaving.  He does have very fragile skin and bruises easily.  Currently has no acute complaints he does have general frailty and appears to be losing weight in general be becoming more fragile.  His other medical issues include CHF no longer on Lasix secondary to hypotension concerns and well-controlled edema- he  also has a history of osteoarthritis we have started tramadol as needed to help with pain  Also atrial fibrillation he is on aspirin not on a beta-blocker because of bradycardia as well as hypotension concerns.  Other conditions include restless leg syndrome he is on Requip as well as depression for which he continues on Zoloft- and a history of peripheral vascular disease with at times foot wounds which have stabilized-he has requested no aggressive work-up     Past Medical History:  Diagnosis Date  . Anxiety   . Benign prostatic hyperplasia   . Bilateral foot pain   . CHF (congestive heart failure) ([place])   . Depression   . Diverticulitis   . DJD (degenerative joint disease), cervical   . DNR (do not resuscitate)   . Esophageal dysmotility    age related per BPE 04/2015  . Esophageal stricture   . Falling   . GERD (gastroesophageal reflux disease)   . Gout   . Hiatal hernia   . History of recurrent TIAs   . HTN (hypertension)   . Hyperlipidemia   . Inguinal hernia   . Ischemic heart disease   . Kidney stone   . Osteoarthritis    bilat knees  . Pleural effusion   . Reflux   . Restless leg syndrome   . Right hip pain   . Skin cancer, basal cell   . Urinary retention    Past Surgical History:  Procedure Laterality Date  . ESOPHAGEAL DILATION    .  EXPLORATORY LAPAROTOMY W/ BOWEL RESECTION    . FRACTURE SURGERY    . HEMIARTHROPLASTY HIP     Dr. Aline Brochure  . intestines      Allergies  Allergen Reactions  . Celebrex [Celecoxib] Other (See Comments)    Unknown; patient can't remember  . Codeine Other (See Comments)    nausea    Outpatient Encounter Medications as of 03/22/2018  Medication Sig  . acetaminophen (TYLENOL) 325 MG tablet Take 650 mg by mouth every 4 (four) hours as needed.  Marland Kitchen allopurinol (ZYLOPRIM) 100 MG tablet Take 100 mg by mouth daily.  Marland Kitchen aspirin 81 MG chewable tablet Chew 81 mg by mouth daily.  . Cholecalciferol (VITAMIN D) 2000 UNITS CAPS Take 1  capsule by mouth daily.  Marland Kitchen docusate sodium (COLACE) 100 MG capsule Take 1 capsule (100 mg total) by mouth 2 (two) times daily. For constipation.  Marland Kitchen guaiFENesin (MUCINEX) 600 MG 12 hr tablet Take 600 mg 2 (two) times daily as needed by mouth for cough or to loosen phlegm.   Marland Kitchen ipratropium-albuterol (DUONEB) 0.5-2.5 (3) MG/3ML SOLN Take 3 mLs every 6 (six) hours as needed by nebulization (for shortness of breath).  . magnesium hydroxide (MILK OF MAGNESIA) 400 MG/5ML suspension Take 30 mLs by mouth daily as needed for mild constipation. If no relief try a Fleets enema, after 2 days.  . Menthol, Topical Analgesic, (BIOFREEZE) 4 % GEL Apply small to back as needed for pain  . omeprazole (PRILOSEC) 20 MG capsule Take 40 mg by mouth 2 (two) times daily.   . polyethylene glycol (MIRALAX / GLYCOLAX) packet Take 17 g by mouth daily.  Marland Kitchen Propylene Glycol 0.6 % SOLN Apply 1 drop to both eyes twice a day  . rOPINIRole (REQUIP) 2 MG tablet Take 2 mg by mouth at bedtime.  . sertraline (ZOLOFT) 25 MG tablet Take 37.5 mg by mouth at bedtime.  . traMADol (ULTRAM) 50 MG tablet Take 50 mg by mouth daily as needed.   No facility-administered encounter medications on file as of 03/22/2018.     Review of Systems   In general he is not complain of any fever or chills.  Skin does have what appears to be some bruising on the right side of his neck does have some chronic bruising elsewhere with quite fragile skin.  Head ears eyes nose mouth and throat is not complain of sore throat or visual changes.  Respiratory does not complain of being short of breath or having a cough.  Cardiac does not complain of chest pain has I would say trace lower extremity edema.  GI is not complaining of abdominal pain nausea vomiting diarrhea or constipation.  GU does have an indwelling Foley catheter with a history of urinary retention and BPH.  Musculoskeletal is not complaining of joint pain at this time he does have tramadol as  needed for osteoarthritis generalized- also has Tylenol as needed.  He has general frailty and appears to be getting frailer.  Neurologic does have weakness but does not complain of dizziness headache or syncope.  And psych does have a history of depression but this appears to be well controlled gradually is becoming a bit more forgetful.    Immunization History  Administered Date(s) Administered  . Influenza,inj,Quad PF,6+ Mos 12/07/2014  . Influenza-Unspecified 11/20/2013, 11/13/2015, 11/18/2016  . PPD Test 03/16/2013  . Pneumococcal-Unspecified 02/14/2009, 11/25/2015  . Tdap 11/10/2016   Pertinent  Health Maintenance Due  Topic Date Due  . INFLUENZA VACCINE  Completed  .  PNA vac Low Risk Adult  Completed   Fall Risk  11/28/2017 11/10/2016  Falls in the past year? No No   Functional Status Survey:    Vitals:   03/22/18 1251  Weight: 143 lb 9.6 oz (65.1 kg)  Height: 6' (1.829 m)   Body mass index is 19.48 kg/m. Physical Exam   In general this continues to be a pleasant elderly male in no distress he is in his room eating breakfast.  His skin is warm and dry he does have general chronic bruising fragile skin-right side of his neck there is an area what appears to be somewhat new bruising with a small amount of bleeding I do not see signs of cellulitis or infection.  Oropharynx is clear mucous membranes moist.  Eyes sclera and conjunctive are clear visual acuity appears grossly intact.  Chest is clear with shallow air entry there is no labored breathing.  Heart is regular rate and rhythm without murmur gallop or rub he has trace lower extremity edema.  Abdomen is somewhat protuberant he does have a large abdominal hernia which appears to be at baseline bowel sounds are positive.  GU does have an indwelling Foley catheter.  Musculoskeletal has general frailty could not really appreciate any deformities other than chronic arthritic he has generalized  stiffness.  Neurologic is grossly intact his speech is clear he is alert.  Psych he is alert largely oriented has some increased periods of forgetfulness slowly progressing.    Labs reviewed: Recent Labs    10/05/17 0722 11/21/17 0430 01/23/18 0700  NA 135 134* 133*  K 4.0 4.5 4.0  CL 102 101 100  CO2 26 25 24   GLUCOSE 100* 88 89  BUN 15 11 15   CREATININE 1.06 1.12 1.10  CALCIUM 8.2* 8.2* 8.5*   Recent Labs    05/16/17 1255  AST 17  ALT 10*  ALKPHOS 102  BILITOT 0.4  PROT 7.1  ALBUMIN 3.2*   Recent Labs    10/05/17 0722 11/21/17 0430 01/23/18 0700  WBC 6.7 6.4 6.9  NEUTROABS 3.7 3.6 3.7  HGB 9.6* 9.6* 10.3*  HCT 31.4* 31.0* 34.3*  MCV 84.9 82.4 84.7  PLT 212 203 312   Lab Results  Component Value Date   TSH 1.323 04/13/2016   Lab Results  Component Value Date   HGBA1C 6.1 (H) 11/12/2014   No results found for: CHOL, HDL, LDLCALC, LDLDIRECT, TRIG, CHOLHDL  Significant Diagnostic Results in last 30 days:  No results found.  Assessment/Plan  #1 history of left neck bruising-unclear etiology again he has very fragile skin last time this occurred it was thought secondary to shaving- this has been assessed by the wound care nurse and they will be applying a topical antibiotic and monitoring-at this point do not see any signs of infection.  2.  History of weight loss- he continues on supplementation dietary is following this as well I suspect this will be a challenge however considering his comorbidities and advanced age and generally declining status.--Have spoken with dietary and they will increase his supplements---  3.  History of CHF this appears stable he is not on Lasix and does not appear to need this no increased edema or shortness of breath or chest congestion.  4--   atrial fibrillation he is not on a rate limiting agent secondary to bradycardia concerns as well as hypotension concerns he is on aspirin not on anything more aggressive because of  his fall risk and comorbidities  #5  history of osteoarthritis disease been started on tramadol as needed since Tylenol with not always effective apparently this is helping continues to have general frailty which appears to be progressing.  6.  History of rectal bleeding in the past this was a limited episode there is been no reoccurrence family does not desire aggressive work-up hemoglobin has shown stability it was 10.  3 in December.  [id number].

## 2018-03-23 ENCOUNTER — Encounter: Payer: Self-pay | Admitting: Internal Medicine

## 2018-03-23 ENCOUNTER — Non-Acute Institutional Stay (SKILLED_NURSING_FACILITY): Payer: Medicare Other | Admitting: Internal Medicine

## 2018-03-23 DIAGNOSIS — Y92129 Unspecified place in nursing home as the place of occurrence of the external cause: Secondary | ICD-10-CM

## 2018-03-23 DIAGNOSIS — T148XXA Other injury of unspecified body region, initial encounter: Secondary | ICD-10-CM | POA: Diagnosis not present

## 2018-03-23 DIAGNOSIS — W19XXXA Unspecified fall, initial encounter: Secondary | ICD-10-CM | POA: Diagnosis not present

## 2018-03-23 NOTE — Progress Notes (Signed)
Location:    [place] Room Number: 148/W Place of Service:  SNF (31) Provider:  Granville Lewis  Patient, [name]  Patient Care Team: Patient, [name] as PCP - General (General Practice) Danie Binder, MD as Consulting Physician (Gastroenterology) [name], MD as Consulting Physician [facility])  Extended Emergency Contact Information Primary Emergency Contact: Black,Shirley Address: [address]          [place],  [address] [name] of [place] Phone: [phone] Mobile Phone: [phone] Relation: Daughter Secondary Emergency Contact: [facility] Phone: [phone] Relation: Niece  Code Status:  DNR Goals of care: Advanced Directive information Advanced Directives 03/23/2018  Does Patient Have a Medical Advance Directive? Yes  Type of Advance Directive Out of facility DNR (pink MOST or yellow form)  Does patient want to make changes to medical advance directive? No - Patient declined  Copy of [place] in Chart? No - copy requested  Pre-existing out of facility DNR order (yellow form or pink MOST form) -     Chief Complaint  Patient presents with  . Acute Visit    F/U Fall  As well as follow-up of right neck bruise  HPI:  Pt is a 85 y.o. male seen today for an acute visit for follow-up of right neck bruise-as well as a fall this morning.  I saw patient yesterday for new bruise on the right side of his neck with unclear etiology he does have very fragile skin he did not appear to be infected and today appears to be stable he does have protective clear dressing on it do not see signs of infection.  He also fell yesterday morning and again this morning without any apparent injury.  Apparently he gets out of bed by himself.  I did advise him he needs to use his call light- staff is also considering a scoop mattress for protection.  Currently he is sitting in his wheelchair comfortably appears to  be in good spirits has no complaints of pain shortness of breath appears to be at his baseline.  His other medical issues include CHF no longer on Lasix secondary to hypotension concerns and well-controlled edema- he also has a history of osteoarthritis we have started tramadol as needed to help with pain  Also atrial fibrillation he is on aspirin not on a beta-blocker because of bradycardia as well as hypotension concerns.  Other conditions include restless leg syndrome he is on Requip as well as depression for which he continues on Zoloft- and a history of peripheral vascular disease with at times foot wounds which have stabilized-he has requested no aggressive work-up    Past Medical History:  Diagnosis Date  . Anxiety   . Benign prostatic hyperplasia   . Bilateral foot pain   . CHF (congestive heart failure) ([place])   . Depression   . Diverticulitis   . DJD (degenerative joint disease), cervical   . DNR (do not resuscitate)   . Esophageal dysmotility    age related per BPE 04/2015  . Esophageal stricture   . Falling   . GERD (gastroesophageal reflux disease)   . Gout   . Hiatal hernia   . History of recurrent TIAs   . HTN (hypertension)   . Hyperlipidemia   . Inguinal hernia   . Ischemic heart disease   . Kidney stone   . Osteoarthritis    bilat knees  . Pleural effusion   . Reflux   .  Restless leg syndrome   . Right hip pain   . Skin cancer, basal cell   . Urinary retention    Past Surgical History:  Procedure Laterality Date  . ESOPHAGEAL DILATION    . EXPLORATORY LAPAROTOMY W/ BOWEL RESECTION    . FRACTURE SURGERY    . HEMIARTHROPLASTY HIP     Dr. Aline Brochure  . intestines      Allergies  Allergen Reactions  . Celebrex [Celecoxib] Other (See Comments)    Unknown; patient can't remember  . Codeine Other (See Comments)    nausea    Outpatient Encounter Medications as of 03/23/2018  Medication Sig  . acetaminophen (TYLENOL) 325 MG tablet Take 650 mg by  mouth every 4 (four) hours as needed.  Marland Kitchen allopurinol (ZYLOPRIM) 100 MG tablet Take 100 mg by mouth daily.  Marland Kitchen aspirin 81 MG chewable tablet Chew 81 mg by mouth daily.  . Cholecalciferol (VITAMIN D) 2000 UNITS CAPS Take 1 capsule by mouth daily.  Marland Kitchen docusate sodium (COLACE) 100 MG capsule Take 1 capsule (100 mg total) by mouth 2 (two) times daily. For constipation.  Marland Kitchen guaiFENesin (MUCINEX) 600 MG 12 hr tablet Take 600 mg 2 (two) times daily as needed by mouth for cough or to loosen phlegm.   Marland Kitchen ipratropium-albuterol (DUONEB) 0.5-2.5 (3) MG/3ML SOLN Take 3 mLs every 6 (six) hours as needed by nebulization (for shortness of breath).  . magnesium hydroxide (MILK OF MAGNESIA) 400 MG/5ML suspension Take 30 mLs by mouth daily as needed for mild constipation. If no relief try a Fleets enema, after 2 days.  . Menthol, Topical Analgesic, (BIOFREEZE) 4 % GEL Apply small to back as needed for pain  . omeprazole (PRILOSEC) 20 MG capsule Take 40 mg by mouth 2 (two) times daily.   . polyethylene glycol (MIRALAX / GLYCOLAX) packet Take 17 g by mouth daily.  Marland Kitchen Propylene Glycol 0.6 % SOLN Apply 1 drop to both eyes twice a day  . rOPINIRole (REQUIP) 2 MG tablet Take 2 mg by mouth at bedtime.  . sertraline (ZOLOFT) 25 MG tablet Take 37.5 mg by mouth at bedtime.  . traMADol (ULTRAM) 50 MG tablet Take 50 mg by mouth daily as needed.   No facility-administered encounter medications on file as of 03/23/2018.     Review of Systems   General is not complaining any fever chills  Skin does not complain of rashes or itching does have bruising as noted above most recently right neck.  Head ears eyes nose mouth and throat is not complaining of any visual changes or sore throat.  Respiratory does not complain of shortness of breath or cough.  Cardiac denies chest pain has quite mild lower extremity edema.  GI is not complaining of abdominal discomfort nausea vomiting diarrhea constipation.  Musculoskeletal not  complaining of any arm leg or hip pain he did again have a fall this morning.  Neurologic is not complaining of dizziness headache syncope or numbness.  And psych appears to be in good spirits does not complain of being depressed or anxious.    Immunization History  Administered Date(s) Administered  . Influenza,inj,Quad PF,6+ Mos 12/07/2014  . Influenza-Unspecified 11/20/2013, 11/13/2015, 11/18/2016  . PPD Test 03/16/2013  . Pneumococcal-Unspecified 02/14/2009, 11/25/2015  . Tdap 11/10/2016   Pertinent  Health Maintenance Due  Topic Date Due  . INFLUENZA VACCINE  Completed  . PNA vac Low Risk Adult  Completed   Fall Risk  11/28/2017 11/10/2016  Falls in the past year? No  No   Functional Status Survey:    Vitals:   03/23/18 1035  BP: 125/69  Pulse: 78  Resp: 16  Temp: 97.8 F (36.6 C)  TempSrc: Oral  SpO2: 94%  Weight: 143 lb 9.6 oz (65.1 kg)   Body mass index is 19.48 kg/m. Physical Exam In general this continues to be a pleasant frail-appearing elderly male in no distress.  Skin continues to have violaceous erythematous bruising of his right neck this appears comparable to yesterday there is no bleeding he does have clear protective dressing on.  Oropharynx is clear mucous membranes moist tongue is midline.  Eyes sclera and conjunctive are clear visual acuity appears grossly intact  Chest is clear to auscultation with shallow air entry.  There is no labored breathing.  Heart is distant heart sounds regular rate and rhythm could not appreciate a murmur gallop or rub he has scant lower extremity edema bilaterally.  Abdomen continues to have an umbilical hernia at baseline it is soft nontender there are positive bowel sounds.   GU-continues with an indwelling Foley catheter   Musculoskeletal has general frailty at baseline cannot really appreciate deformities other than arthritic there was no pain with gentle passive range of motion of his knee-flexion  extension at his hips-and flexion and extension at the elbow.  Neurologic is grossly intact his speech is clear no lateralizing findings.  Psych continues to be largely alert and oriented actually appears to be a bit brighter than he did yesterday  Labs reviewed: Recent Labs    10/05/17 0722 11/21/17 0430 01/23/18 0700  NA 135 134* 133*  K 4.0 4.5 4.0  CL 102 101 100  CO2 26 25 24   GLUCOSE 100* 88 89  BUN 15 11 15   CREATININE 1.06 1.12 1.10  CALCIUM 8.2* 8.2* 8.5*   Recent Labs    05/16/17 1255  AST 17  ALT 10*  ALKPHOS 102  BILITOT 0.4  PROT 7.1  ALBUMIN 3.2*   Recent Labs    10/05/17 0722 11/21/17 0430 01/23/18 0700  WBC 6.7 6.4 6.9  NEUTROABS 3.7 3.6 3.7  HGB 9.6* 9.6* 10.3*  HCT 31.4* 31.0* 34.3*  MCV 84.9 82.4 84.7  PLT 212 203 312   Lab Results  Component Value Date   TSH 1.323 04/13/2016   Lab Results  Component Value Date   HGBA1C 6.1 (H) 11/12/2014   No results found for: CHOL, HDL, LDLCALC, LDLDIRECT, TRIG, CHOLHDL  Significant Diagnostic Results in last 30 days:  No results found.  Assessment/Plan  #1-history of right neck bruise this appears stable without sign of infection at this point nursing will continue to monitor he does have a protective dressing and I see any bleeding today again he is quite fragile and bruises easily.  2.  History of falls- he fell again this morning with no apparent injury- I did talk to him about being careful in the morning and waiting for staff to get him out of bed and using his call light-he expressed understanding.  Nursing is also considering a scoop mattress at this point will monitor he does not appear to have any apparent injuries.   [id number]

## 2018-03-27 ENCOUNTER — Encounter: Payer: Self-pay | Admitting: Internal Medicine

## 2018-03-27 ENCOUNTER — Non-Acute Institutional Stay (SKILLED_NURSING_FACILITY): Payer: Medicare Other | Admitting: Internal Medicine

## 2018-03-27 DIAGNOSIS — L989 Disorder of the skin and subcutaneous tissue, unspecified: Secondary | ICD-10-CM

## 2018-03-27 DIAGNOSIS — M1712 Unilateral primary osteoarthritis, left knee: Secondary | ICD-10-CM | POA: Diagnosis not present

## 2018-03-27 DIAGNOSIS — I4891 Unspecified atrial fibrillation: Secondary | ICD-10-CM | POA: Diagnosis not present

## 2018-03-27 DIAGNOSIS — F329 Major depressive disorder, single episode, unspecified: Secondary | ICD-10-CM | POA: Diagnosis not present

## 2018-03-27 DIAGNOSIS — F32A Depression, unspecified: Secondary | ICD-10-CM

## 2018-03-27 DIAGNOSIS — R1310 Dysphagia, unspecified: Secondary | ICD-10-CM

## 2018-03-27 DIAGNOSIS — I509 Heart failure, unspecified: Secondary | ICD-10-CM

## 2018-03-27 NOTE — Progress Notes (Signed)
Location:    [facility] Room Number: 148/W Place of Service:  SNF [id number]) Provider:  Freddi Starr, MD  Patient Care Team: [name], MD as PCP - General (Internal Medicine) Danie Binder, MD as Consulting Physician (Gastroenterology) [name], MD as Consulting Physician (Genetics) Wille Celeste, PA-C as Physician Assistant (Internal Medicine)  Extended Emergency Contact Information Primary Emergency Contact: Black,Shirley Address: [address]          [place], [place] [address] [name] of [place] Phone: [phone] Mobile Phone: [phone] Relation: Daughter Secondary Emergency Contact: [facility] Phone: [phone] Relation: Niece  Code Status:  DNR Goals of care: Advanced Directive information Advanced Directives 03/27/2018  Does Patient Have a Medical Advance Directive? Yes  Type of Advance Directive Out of facility DNR (pink MOST or yellow form)  Does patient want to make changes to medical advance directive? No - Patient declined  Copy of [place] in Chart? -  Pre-existing out of facility DNR order (yellow form or pink MOST form) -   -Chief complaint- routine visit for medical management of chronic medical conditions including hypertension-CHF-depression-osteoarthritis- peripheral vascular disease-history of dysphasia with aspiration pneumonia- post leg syndrome-atrial fibrillation-and urinary retention he has a Foley catheter-    HPI:  Pt is a 85 y.o. male seen today for an acute visit for follow-up of chronic medical conditions- He was recently seen for some skin tears of his neck-also he has had a couple falls with no injuries.  Patient skin is very fragile and any type of friction could cause the bruising which appears to be slowly resolving.  He is also been noted to have an area behind his left ear that appears almost somewhat fluid-filled with pale erythema he does have a  significant history of skin cancer and has been followed by dermatology and has had numerous removals.  He also appears to be losing some weight. He has lost about 8 pounds over the past month or 2- dietary has increased  his supplementation  He does have a history of dysphasia and is on a modified diet and apparently he has a fairly decent appetite.  He recently did complain of some GERD-like symptoms and we did increase his Prilosec to twice a day as well.  Regards to hypertension and atrial fibrillation he is currently not on any rate control agent or medication for blood pressure because at times he gets somewhat hypotensive he has been on a beta-blocker before but this was discontinued because of this   Also his Flomax was discontinued because of hypotension concerns.  He does have a history of urinary retention and appears to be tolerating his Foley catheter well.  Currently he is sitting in his wheelchair he has no complaints he is visiting with his niece.  Vital signs appear to be stable he has no complaints   He also has a history of rectal bleeding last year he did have some occult positive stools but the most recent test was negative- he was taken off his aspirin for a while but this has been restarted and hemoglobin appears to be stabilized.  He is also on aspirin for history of peripheral vascular disease he has not wanted any aggressive work-up or studies which is understandable.  In regards to generalized pain most likely osteoarthritic he does have an order for tramadol once a day as needed he also has PRN Tylenol  Past Medical History:  Diagnosis Date  . Anxiety   . Benign prostatic hyperplasia   . Bilateral foot pain   . CHF (congestive heart failure) ([place])   . Depression   . Diverticulitis   . DJD (degenerative joint disease), cervical   . DNR (do not resuscitate)   . Esophageal dysmotility    age related per BPE 04/2015  . Esophageal stricture   . Falling   .  GERD (gastroesophageal reflux disease)   . Gout   . Hiatal hernia   . History of recurrent TIAs   . HTN (hypertension)   . Hyperlipidemia   . Inguinal hernia   . Ischemic heart disease   . Kidney stone   . Osteoarthritis    bilat knees  . Pleural effusion   . Reflux   . Restless leg syndrome   . Right hip pain   . Skin cancer, basal cell   . Urinary retention    Past Surgical History:  Procedure Laterality Date  . ESOPHAGEAL DILATION    . EXPLORATORY LAPAROTOMY W/ BOWEL RESECTION    . FRACTURE SURGERY    . HEMIARTHROPLASTY HIP     Dr. Aline Brochure  . intestines      Allergies  Allergen Reactions  . Celebrex [Celecoxib] Other (See Comments)    Unknown; patient can't remember  . Codeine Other (See Comments)    nausea    Outpatient Encounter Medications as of 03/27/2018  Medication Sig  . acetaminophen (TYLENOL) 325 MG tablet Take 650 mg by mouth every 4 (four) hours as needed.  Marland Kitchen allopurinol (ZYLOPRIM) 100 MG tablet Take 100 mg by mouth daily.  Marland Kitchen aspirin 81 MG chewable tablet Chew 81 mg by mouth daily.  . Cholecalciferol (VITAMIN D) 2000 UNITS CAPS Take 1 capsule by mouth daily.  Marland Kitchen docusate sodium (COLACE) 100 MG capsule Take 1 capsule (100 mg total) by mouth 2 (two) times daily. For constipation.  Marland Kitchen guaiFENesin (MUCINEX) 600 MG 12 hr tablet Take 600 mg 2 (two) times daily as needed by mouth for cough or to loosen phlegm.   Marland Kitchen ipratropium-albuterol (DUONEB) 0.5-2.5 (3) MG/3ML SOLN Take 3 mLs every 6 (six) hours as needed by nebulization (for shortness of breath).  . magnesium hydroxide (MILK OF MAGNESIA) 400 MG/5ML suspension Take 30 mLs by mouth daily as needed for mild constipation. If no relief try a Fleets enema, after 2 days.  . Menthol, Topical Analgesic, (BIOFREEZE) 4 % GEL Apply small to back as needed for pain  . omeprazole (PRILOSEC) 20 MG capsule Take 40 mg by mouth 2 (two) times daily.   . polyethylene glycol (MIRALAX / GLYCOLAX) packet Take 17 g by mouth daily.    Marland Kitchen Propylene Glycol 0.6 % SOLN Apply 1 drop to both eyes twice a day  . rOPINIRole (REQUIP) 2 MG tablet Take 2 mg by mouth at bedtime.  . sertraline (ZOLOFT) 25 MG tablet Take 37.5 mg by mouth at bedtime.  . traMADol (ULTRAM) 50 MG tablet Take 50 mg by mouth daily as needed.   No facility-administered encounter medications on file as of 03/27/2018.     Review of Systems   In general is not complaining of any fever chills he does appear to have some gradual weight loss.  Skin he does have a history of bruising easily as well as history of superficial skin cancers he has been followed by dermatology as noted above  Head ears eyes nose mouth and throat he has prescription lenses is not complaining of visual  changes or sore throat.  Respiratory does not complain of shortness of breath or cough.  Cardiac does not complain of chest pain has minimal lower extremity edema.  GI is not complaining of abdominal pain nausea vomiting diarrhea or constipation.  GU does not complain of dysuria he does have an indwelling Foley catheter.  Musculoskeletal has generalized weakness with arthritic changes and arthritic discomfort apparently the Tylenol and Ultram as needed is effective.  Neurologic does not complain of dizziness headache numbness or syncope.  And psych appears to be in good spirits today speaking with his niece does not complain of being depressed or anxious he is on Zoloft for depression  Immunization History  Administered Date(s) Administered  . Influenza,inj,Quad PF,6+ Mos 12/07/2014  . Influenza-Unspecified 11/20/2013, 11/13/2015, 11/18/2016  . PPD Test 03/16/2013  . Pneumococcal-Unspecified 02/14/2009, 11/25/2015  . Tdap 11/10/2016   Pertinent  Health Maintenance Due  Topic Date Due  . INFLUENZA VACCINE  Completed  . PNA vac Low Risk Adult  Completed   Fall Risk  11/28/2017 11/10/2016  Falls in the past year? No No   Functional Status Survey:    Blood pressures 103/55  pulse is 65 respirations 22 temperature is 97.4  Physical Exam   In general this is a pleasant elderly male in no distress sitting comfortably in his wheelchair   His skin is warm and dry he does have numerous old bruising most recently most prominently of his neck-I do not see signs of infection-behind his left ear there is a linear lesion that appears almost fluid-filled with erythema-it is not really tender to touch I did not see any surrounding erythema- appears possibly to have some slight rolled borders.  Eyes visual acuity appears intact sclera and conjunctive are clear he has prescription lenses.  Oropharynx is clear mucous membranes moist.  Chest is clear to auscultation with shallow air entry there is no labored breathing.  Heart is distant heart sounds from what I could hear regular rate and rhythm could not appreciate a murmur gallop or rub he has mild  lower extremity edema.  Abdomen is somewhat protuberant with an abdominal hernia abdomen is soft with active bowel sounds  Musculoskeletal has diffuse arthritic changes with frailty upper and lower extremities with stiffness of his lower extremities at baseline- baseline with previous exams.  Neurologic is grossly intact his speech is clear could not appreciate lateralizing findings.  Psych he is largely alert and oriented is hard of hearing continues to be very pleasant  Labs reviewed: Recent Labs    10/05/17 0722 11/21/17 0430 01/23/18 0700  NA 135 134* 133*  K 4.0 4.5 4.0  CL 102 101 100  CO2 26 25 24   GLUCOSE 100* 88 89  BUN 15 11 15   CREATININE 1.06 1.12 1.10  CALCIUM 8.2* 8.2* 8.5*   Recent Labs    05/16/17 1255  AST 17  ALT 10*  ALKPHOS 102  BILITOT 0.4  PROT 7.1  ALBUMIN 3.2*   Recent Labs    10/05/17 0722 11/21/17 0430 01/23/18 0700  WBC 6.7 6.4 6.9  NEUTROABS 3.7 3.6 3.7  HGB 9.6* 9.6* 10.3*  HCT 31.4* 31.0* 34.3*  MCV 84.9 82.4 84.7  PLT 212 203 312   Lab Results  Component Value  Date   TSH 1.323 04/13/2016   Lab Results  Component Value Date   HGBA1C 6.1 (H) 11/12/2014   No results found for: CHOL, HDL, LDLCALC, LDLDIRECT, TRIG, CHOLHDL  Significant Diagnostic Results in last 30  days:  No results found.  Assessment/Plan  #1 history of lesion behind the left ear- he does have a significant history of skin cancers-I did speak with his niece and family does not really desire aggressive work-up that would involve a biopsy or cutting of the area-nonetheless we will send to dermatology to get their opinion on this-at this point it does not appear to be infected.  2.  History of dysphasia with aspiration pneumonia-he has been stable on his current diet- he does not complain of shortness of breath or cough but does continue to be at risk.  He does have duo nebs and Robitussin as needed   #3 story of hypertension again he is not on any agent because of hypotension concerns this appears to be stable blood pressure tends to run more on the lower side  #4-atrial fibrillation with rapid ventricular rate again not on a rate control agent because of hypotension concerns- this appears stable nonetheless he is only on low-dose aspirin because of fall risk and history of previous rectal bleeding.  5.  History of rectal bleeding last year that appears to have been transitory had occult positive stools at that time but recent 1 has been negative-aspirin was stopped but has been restarted- hemoglobin has shown stability.  6.-  History of peripheral vascular disease again he is only on low-dose aspirin patient does not wish aggressive work-up of this.    7.-History of osteoarthritis diffuse- he is on Tylenol as needed and does have an order for tramadol once a day as needed---he also has topical Biofreeze at this point appears relatively well controlled  8.  History depression this is stable on Zoloft at 1 point he expressed depressive thoughts but this has been quite stable  some  time-continues to be in good spirits very pleasant individual  10.  History of urinary retention he has a chronic Foley catheter and this appears to be well-tolerated no longer on Flomax because of hypotension concerns  #11 history of restless leg syndrome he is on Requip nightly this appears to be helping.  12.  History of CHF edema appears fairly minimal today- he does not complain of shortness of breath or cough.  13.  History of recurrent   Gout--he is on allopurinol and this appears to be helping avoiding recurrent gout flares which he used to have   [id number]

## 2018-04-02 ENCOUNTER — Encounter: Payer: Self-pay | Admitting: Internal Medicine

## 2018-04-02 ENCOUNTER — Non-Acute Institutional Stay (SKILLED_NURSING_FACILITY): Payer: Medicare Other | Admitting: Internal Medicine

## 2018-04-02 DIAGNOSIS — N401 Enlarged prostate with lower urinary tract symptoms: Secondary | ICD-10-CM

## 2018-04-02 DIAGNOSIS — R634 Abnormal weight loss: Secondary | ICD-10-CM

## 2018-04-02 DIAGNOSIS — R1310 Dysphagia, unspecified: Secondary | ICD-10-CM

## 2018-04-02 DIAGNOSIS — I739 Peripheral vascular disease, unspecified: Secondary | ICD-10-CM

## 2018-04-02 NOTE — Progress Notes (Signed)
Location:  [facility] Room Number: [place] of Service:  SNF [phone]) Provider:  Susanne Borders, MD  [name], MD  Patient Care Team: [name], MD as PCP - General (Internal Medicine) Danie Binder, MD as Consulting Physician (Gastroenterology) [name], MD as Consulting Physician (Genetics) Wille Celeste, PA-C as Physician Assistant (Internal Medicine)  Extended Emergency Contact Information Primary Emergency Contact: Black,Shirley Address: [address]          [place], [place] [address] [name] of [place] Phone: [phone] Mobile Phone: [phone] Relation: Daughter Secondary Emergency Contact: [facility] Phone: [phone] Relation: Niece  Code Status:  DNR Goals of care: Advanced Directive information Advanced Directives 04/02/2018  Does Patient Have a Medical Advance Directive? Yes  Type of Advance Directive Out of facility DNR (pink MOST or yellow form)  Does patient want to make changes to medical advance directive? No - Patient declined  Copy of [place] in Chart? -  Pre-existing out of facility DNR order (yellow form or pink MOST form) Yellow form placed in chart (order not valid for inpatient use)     Chief Complaint  Patient presents with  . Medical Management of Chronic Issues    Routine visit of medical management    HPI:  Pt is a 85 y.o. male seen today for medical management of chronic diseases.    Patient Is Long term resident of facility and has h/o HTN, CHF, Depression, Arthritis, Anemia, GERD, Chronic Foley Cathter due to Urinary retention.PVD ABI in 2015 refused Aggressive approach, Aspiration Pneumonia, Dysphagia, Gout, paroxysmal Atrial Fibrillation not on any anticoagulation  Patient is stable in the facility. But over Last Few months Patient has lost lot of weight. Over past few months he lost almost 15 lbs and is down to 143 lbs. Patient is getting new dentures  in few days. I saw him today and he didn't have any new complains . He is just weak but he says he is eating well. He does have some Bruises on his Neck and chest and his Hands. I also talked to his niece who comes and visits him in the facility and left message for his Daughter who could not come for face to face meeting with him Per Nurses patient is not having any other acute issues  Past Medical History:  Diagnosis Date  . Anxiety   . Benign prostatic hyperplasia   . Bilateral foot pain   . CHF (congestive heart failure) ([place])   . Depression   . Diverticulitis   . DJD (degenerative joint disease), cervical   . DNR (do not resuscitate)   . Esophageal dysmotility    age related per BPE 04/2015  . Esophageal stricture   . Falling   . GERD (gastroesophageal reflux disease)   . Gout   . Hiatal hernia   . History of recurrent TIAs   . HTN (hypertension)   . Hyperlipidemia   . Inguinal hernia   . Ischemic heart disease   . Kidney stone   . Osteoarthritis    bilat knees  . Pleural effusion   . Reflux   . Restless leg syndrome   . Right hip pain   . Skin cancer, basal cell   . Urinary retention    Past Surgical History:  Procedure Laterality Date  . ESOPHAGEAL DILATION    . EXPLORATORY LAPAROTOMY W/ BOWEL RESECTION    . FRACTURE SURGERY    .  HEMIARTHROPLASTY HIP     Dr. Aline Brochure  . intestines      Allergies  Allergen Reactions  . Celebrex [Celecoxib] Other (See Comments)    Unknown; patient can't remember  . Codeine Other (See Comments)    nausea    Outpatient Encounter Medications as of 04/02/2018  Medication Sig  . acetaminophen (TYLENOL) 325 MG tablet Take 650 mg by mouth every 4 (four) hours as needed.  Marland Kitchen allopurinol (ZYLOPRIM) 100 MG tablet Take 100 mg by mouth daily.  Marland Kitchen aspirin 81 MG chewable tablet Chew 81 mg by mouth daily.  . Cholecalciferol (VITAMIN D) 2000 UNITS CAPS Take 1 capsule by mouth daily.  Marland Kitchen docusate sodium (COLACE) 100 MG capsule Take 1  capsule (100 mg total) by mouth 2 (two) times daily. For constipation.  Marland Kitchen guaiFENesin (MUCINEX) 600 MG 12 hr tablet Take 600 mg 2 (two) times daily as needed by mouth for cough or to loosen phlegm.   Marland Kitchen ipratropium-albuterol (DUONEB) 0.5-2.5 (3) MG/3ML SOLN Take 3 mLs every 6 (six) hours as needed by nebulization (for shortness of breath).  . magnesium hydroxide (MILK OF MAGNESIA) 400 MG/5ML suspension Take 30 mLs by mouth daily as needed for mild constipation. If no relief try a Fleets enema, after 2 days.  . Menthol, Topical Analgesic, (BIOFREEZE) 4 % GEL Apply small to back as needed for pain  . NON FORMULARY Diet Type:  Dysphagia 2 diet / NAS / thin liquids and 1.5 lt Fluid restriction  . Nutritional Supplements (ENSURE ENLIVE PO) Take by mouth. Give by mouth two times daily.. Prefers Chocolate  . omeprazole (PRILOSEC) 20 MG capsule Take 40 mg by mouth 2 (two) times daily.   . polyethylene glycol (MIRALAX / GLYCOLAX) packet Take 17 g by mouth daily.  Marland Kitchen Propylene Glycol 0.6 % SOLN Apply 1 drop to both eyes twice a day  . rOPINIRole (REQUIP) 2 MG tablet Take 2 mg by mouth at bedtime.  . sertraline (ZOLOFT) 25 MG tablet Take 37.5 mg by mouth at bedtime.  . traMADol (ULTRAM) 50 MG tablet Take 50 mg by mouth daily as needed.   No facility-administered encounter medications on file as of 04/02/2018.     Review of Systems  Constitutional: Positive for activity change and unexpected weight change.  HENT: Positive for dental problem.   Respiratory: Negative.   Cardiovascular: Negative.  Negative for leg swelling.  Gastrointestinal: Negative.   Genitourinary: Negative.   Musculoskeletal: Negative.   Skin: Positive for color change.  Neurological: Positive for weakness.  Psychiatric/Behavioral: Negative.     Immunization History  Administered Date(s) Administered  . Influenza,inj,Quad PF,6+ Mos 12/07/2014  . Influenza-Unspecified 11/20/2013, 11/13/2015, 11/18/2016  . PPD Test 03/16/2013  .  Pneumococcal-Unspecified 02/14/2009, 11/25/2015  . Tdap 11/10/2016   Pertinent  Health Maintenance Due  Topic Date Due  . INFLUENZA VACCINE  Completed  . PNA vac Low Risk Adult  Completed   Fall Risk  11/28/2017 11/10/2016  Falls in the past year? No No   Functional Status Survey:    Vitals:   04/02/18 0838  BP: 134/64  Pulse: 88  Resp: 18  Temp: (!) 97 F (36.1 C)  Weight: 143 lb 9.6 oz (65.1 kg)  Height: 6' (1.829 m)   Body mass index is 19.48 kg/m. Physical Exam Constitutional:      Appearance: He is well-developed.  HENT:     Head: Normocephalic.  Eyes:     Pupils: Pupils are equal, round, and reactive to light.  Neck:     Musculoskeletal: Neck supple.  Cardiovascular:     Rate and Rhythm: Normal rate and regular rhythm.  Pulmonary:     Effort: Pulmonary effort is normal.     Breath sounds: Normal breath sounds.  Abdominal:     General: Bowel sounds are normal.     Palpations: Abdomen is soft.  Musculoskeletal:     Comments: Patient continues to have 2 sores on his Toes in right Foot but they seem to be chronic and not infected or tender. His Both legs have very poor circulation  Skin:    General: Skin is warm and dry.     Comments: Has bruises on his Chest and hands  Neurological:     Mental Status: He is alert and oriented to person, place, and time.  Psychiatric:        Behavior: Behavior normal.        Thought Content: Thought content normal.     Labs reviewed: Recent Labs    10/05/17 0722 11/21/17 0430 01/23/18 0700  NA 135 134* 133*  K 4.0 4.5 4.0  CL 102 101 100  CO2 26 25 24   GLUCOSE 100* 88 89  BUN 15 11 15   CREATININE 1.06 1.12 1.10  CALCIUM 8.2* 8.2* 8.5*   Recent Labs    05/16/17 1255  AST 17  ALT 10*  ALKPHOS 102  BILITOT 0.4  PROT 7.1  ALBUMIN 3.2*   Recent Labs    10/05/17 0722 11/21/17 0430 01/23/18 0700  WBC 6.7 6.4 6.9  NEUTROABS 3.7 3.6 3.7  HGB 9.6* 9.6* 10.3*  HCT 31.4* 31.0* 34.3*  MCV 84.9 82.4 84.7    PLT 212 203 312   Lab Results  Component Value Date   TSH 1.323 04/13/2016   Lab Results  Component Value Date   HGBA1C 6.1 (H) 11/12/2014   No results found for: CHOL, HDL, LDLCALC, LDLDIRECT, TRIG, CHOLHDL  Significant Diagnostic Results in last 30 days:  No results found.  Assessment/Plan Significant Weight loss D/W the patient  He says he feels fine At this time he told me he does not want any aggressive measures. He does not want to be transferred to hospital. No Feeding tubes He think he will do better when his dentures will come I hve left message for his POA Daughter and also talked to her Niece today He is DNR Will Consider Comfort Care after confirming with his daughter Hypotension Patient had some episodes of Low SBP of 90 Patient stays asymtomatic Rectal bleeding Noticed by staff few months ago and he was occult positive Aspirin was stopped and was started on Protonix It has mostly resolved and Hgb was stable On  Baby Aspirin Family did not want anything Aggressive H/O Skin Cancers Follows with Dermatology for his Lesion behind the Ear and his Forehead  Atrial fibrillation with RVRspontaneously resolved Patient hadone episode in the hospital when he was admitted for Pneumonia. No Anticoagulation due to his age and frailty. Continue on baby aspirin. No more episodes in the facility. Dysphagia On modified diet Though patient is not Compliant PVD Patient refuses further work up. Continue on aspirin D/w the Wound care nurse for 2 sores on Right leg. Will continue to monitor GERD On Prilosec and now doing well.  Iron deficiency anemia,  Hgb Stable No aggressive work up due to his age. Gout Has not have Toe pain since he has been on Allopurinal Depression Doing wellOn Zoloft  Osteoarthritis Pain controlled on Tylenol.and  Ultram  Restless legs Doing well on Requip  Urinary Retention Continue Chronic  Foley  Cathter.    Family/ staff Communication:   Labs/tests ordered:  Total time spent in this patient care encounter was 25_ minutes; greater than 50% of the visit spent counseling patient, reviewing records , Labs and coordinating care for problems addressed at this encounter.

## 2018-04-05 DIAGNOSIS — C44229 Squamous cell carcinoma of skin of left ear and external auricular canal: Secondary | ICD-10-CM | POA: Diagnosis not present

## 2018-04-05 DIAGNOSIS — L0100 Impetigo, unspecified: Secondary | ICD-10-CM | POA: Diagnosis not present

## 2018-04-05 DIAGNOSIS — C44222 Squamous cell carcinoma of skin of right ear and external auricular canal: Secondary | ICD-10-CM | POA: Diagnosis not present

## 2018-04-10 ENCOUNTER — Non-Acute Institutional Stay (SKILLED_NURSING_FACILITY): Payer: Medicare Other | Admitting: Adult Health

## 2018-04-10 ENCOUNTER — Encounter: Payer: Self-pay | Admitting: Adult Health

## 2018-04-10 DIAGNOSIS — I739 Peripheral vascular disease, unspecified: Secondary | ICD-10-CM

## 2018-04-10 NOTE — Progress Notes (Signed)
Location:   [place] Room Number: [place] of Service:  SNF (31)   CODE STATUS: DNR  Allergies  Allergen Reactions  . Celebrex [Celecoxib] Other (See Comments)    Unknown; patient can't remember  . Codeine Other (See Comments)    nausea    Chief Complaint  Patient presents with  . Acute Visit    Wound on Right Foot    HPI:  He has chronic wounds on right foot; now on right lateral foot as well. He has PVD; no further workup desired. No reports fevers present; no reports of pain; no changes in appetite.   Past Medical History:  Diagnosis Date  . Anxiety   . Benign prostatic hyperplasia   . Bilateral foot pain   . CHF (congestive heart failure) ([place])   . Depression   . Diverticulitis   . DJD (degenerative joint disease), cervical   . DNR (do not resuscitate)   . Esophageal dysmotility    age related per BPE 04/2015  . Esophageal stricture   . Falling   . GERD (gastroesophageal reflux disease)   . Gout   . Hiatal hernia   . History of recurrent TIAs   . HTN (hypertension)   . Hyperlipidemia   . Inguinal hernia   . Ischemic heart disease   . Kidney stone   . Osteoarthritis    bilat knees  . Pleural effusion   . Reflux   . Restless leg syndrome   . Right hip pain   . Skin cancer, basal cell   . Urinary retention     Past Surgical History:  Procedure Laterality Date  . ESOPHAGEAL DILATION    . EXPLORATORY LAPAROTOMY W/ BOWEL RESECTION    . FRACTURE SURGERY    . HEMIARTHROPLASTY HIP     Dr. Aline Brochure  . intestines      Social History   Socioeconomic History  . Marital status: Married    Spouse name: Not on file  . Number of children: Not on file  . Years of education: na  . Highest education level: Not on file  Occupational History  . Occupation: retired  Scientific laboratory technician  . Financial resource strain: Not hard at all  . Food insecurity:    Worry: Never true    Inability: Never true  . Transportation needs:   Medical: No    Non-medical: No  Tobacco Use  . Smoking status: Never Smoker  . Smokeless tobacco: Never Used  Substance and Sexual Activity  . Alcohol use: No    Alcohol/week: 0.0 standard drinks  . Drug use: No  . Sexual activity: Not on file  Lifestyle  . Physical activity:    Days per week: 0 days    Minutes per session: 0 min  . Stress: Not at all  Relationships  . Social connections:    Talks on phone: Never    Gets together: Never    Attends religious service: Never    Active member of club or organization: No    Attends meetings of clubs or organizations: Never    Relationship status: Married  . Intimate partner violence:    Fear of current or ex partner: No    Emotionally abused: No    Physically abused: No    Forced sexual activity: No  Other Topics Concern  . Not on file  Social History Narrative  . Not on file   Family History  Problem Relation Age of Onset  .  Arthritis Other       VITAL SIGNS BP 131/62   Pulse 79   Temp (!) 97 F (36.1 C)   Resp 20   Ht 6' (1.829 m)   Wt 144 lb 9.6 oz (65.6 kg)   BMI 19.61 kg/m   Outpatient Encounter Medications as of 04/10/2018  Medication Sig  . acetaminophen (TYLENOL) 325 MG tablet Take 650 mg by mouth every 4 (four) hours as needed.  Marland Kitchen allopurinol (ZYLOPRIM) 100 MG tablet Take 100 mg by mouth daily.  Marland Kitchen aspirin 81 MG chewable tablet Chew 81 mg by mouth daily.  . Cholecalciferol (VITAMIN D) 2000 UNITS CAPS Take 1 capsule by mouth daily.  Marland Kitchen docusate sodium (COLACE) 100 MG capsule Take 1 capsule (100 mg total) by mouth 2 (two) times daily. For constipation.  Marland Kitchen guaiFENesin (MUCINEX) 600 MG 12 hr tablet Take 600 mg 2 (two) times daily as needed by mouth for cough or to loosen phlegm.   Marland Kitchen ipratropium-albuterol (DUONEB) 0.5-2.5 (3) MG/3ML SOLN Take 3 mLs every 6 (six) hours as needed by nebulization (for shortness of breath).  . magnesium hydroxide (MILK OF MAGNESIA) 400 MG/5ML suspension Take 30 mLs by mouth daily  as needed for mild constipation. If no relief try a Fleets enema, after 2 days.  . Menthol, Topical Analgesic, (BIOFREEZE) 4 % GEL Apply small to back as needed for pain  . mupirocin ointment (BACTROBAN) 2 % Apply topically to areas located on face three times daily as needed  . neomycin-bacitracin-polymyxin (NEOSPORIN) ointment Apply ointment and telfa topically to skin tear on  right elbow. Wrap with Kling.  Change daily and as needed until resolved  . neomycin-bacitracin-polymyxin (NEOSPORIN) ointment Apply ointment and tegaderm topically to skin tear to right neck.  Change daily and as needed until resolved  . NON FORMULARY Diet Type:  Dysphagia 2 diet / NAS / thin liquids and 1.5 lt Fluid restriction  . Nutritional Supplements (ENSURE ENLIVE PO) Take by mouth. Give by mouth two times daily.. Prefers Chocolate  . omeprazole (PRILOSEC) 20 MG capsule Take 40 mg by mouth 2 (two) times daily.   Marland Kitchen oseltamivir (TAMIFLU) 75 MG capsule Take 75 mg by mouth at bedtime.  . polyethylene glycol (MIRALAX / GLYCOLAX) packet Take 17 g by mouth daily.  Marland Kitchen Propylene Glycol 0.6 % SOLN Apply 1 drop to both eyes twice a day  . rOPINIRole (REQUIP) 2 MG tablet Take 2 mg by mouth at bedtime.  . sertraline (ZOLOFT) 25 MG tablet Take 37.5 mg by mouth at bedtime.  . traMADol (ULTRAM) 50 MG tablet Take 50 mg by mouth daily as needed.  . Vitamins A & D (VITAMIN A & D) ointment Apply topically to bilateral feet and toes every shift as needed for dryness and prevention of callous   No facility-administered encounter medications on file as of 04/10/2018.      SIGNIFICANT DIAGNOSTIC EXAMS  LABS REVIEWED TODAY:   01-23-18: wbc 6.9; hgb 10.3; hct 34.3 ;mcv 84.7 plt 312; glucose 89; bun 15; creat 1.10 ;k+ 4.0; na++ 133; ca 8.5   Review of Systems  Constitutional: Negative for malaise/fatigue.  Respiratory: Negative for cough and shortness of breath.   Cardiovascular: Negative for chest pain, palpitations and leg  swelling.  Gastrointestinal: Negative for abdominal pain, constipation and heartburn.  Musculoskeletal: Negative for back pain, joint pain and myalgias.  Skin:       Has skin sore on right foot   Neurological: Negative for dizziness.  Psychiatric/Behavioral: The  patient is not nervous/anxious.     Physical Exam Constitutional:      General: He is not in acute distress.    Appearance: He is well-developed. He is not diaphoretic.  Neck:     Musculoskeletal: Neck supple.     Thyroid: No thyromegaly.  Cardiovascular:     Rate and Rhythm: Normal rate and regular rhythm.     Heart sounds: Normal heart sounds.     Comments: Pedal pulses faint  Pulmonary:     Effort: Pulmonary effort is normal. No respiratory distress.     Breath sounds: Normal breath sounds.  Abdominal:     General: Bowel sounds are normal. There is no distension.     Palpations: Abdomen is soft.     Tenderness: There is no abdominal tenderness.  Genitourinary:    Comments: As foley  Musculoskeletal: Normal range of motion.     Right lower leg: Edema present.     Left lower leg: Edema present.     Comments: Has trace bilateral lower extremity edema   Lymphadenopathy:     Cervical: No cervical adenopathy.  Skin:    General: Skin is warm and dry.     Comments: Right foot wound/right lateral foot wound Red hot has scabs on toes Has yellow necrosis present on wound bed; no drainage present Are red and hot   Neurological:     Mental Status: He is alert and oriented to person, place, and time.  Psychiatric:        Mood and Affect: Mood normal.        ASSESSMENT/ PLAN:  TODAY:   1. Peripheral vascular disease: is worse: will begin doxycycline 100 mg twice daily through 04-20-18. Will stop ultram due to non use.       MD is aware of resident's narcotic use and is in agreement with current plan of care. We will attempt to wean resident as apropriate   Ok Edwards NP [facility]  Contact  [phone] Monday through Friday 8am- 5pm  After hours call [phone]

## 2018-04-20 ENCOUNTER — Other Ambulatory Visit: Payer: Self-pay | Admitting: Adult Health

## 2018-04-20 ENCOUNTER — Encounter: Payer: Self-pay | Admitting: Adult Health

## 2018-04-20 ENCOUNTER — Non-Acute Institutional Stay (SKILLED_NURSING_FACILITY): Payer: Medicare Other | Admitting: Adult Health

## 2018-04-20 DIAGNOSIS — R627 Adult failure to thrive: Secondary | ICD-10-CM | POA: Diagnosis not present

## 2018-04-20 DIAGNOSIS — F0150 Vascular dementia without behavioral disturbance: Secondary | ICD-10-CM | POA: Insufficient documentation

## 2018-04-20 MED ORDER — MORPHINE SULFATE (CONCENTRATE) 20 MG/ML PO SOLN: 5.0000 mg | ORAL | 0 refills | Status: AC | PRN

## 2018-04-20 NOTE — Progress Notes (Signed)
Location:   [place] Room Number: [place] of Service:  SNF (31)   CODE STATUS: DNR  Allergies  Allergen Reactions  . Celebrex [Celecoxib] Other (See Comments)    Unknown; patient can't remember  . Codeine Other (See Comments)    nausea    Chief Complaint  Patient presents with  . Acute Visit    Change in Status    HPI:  He is at end of life. He is not responsive to stimuli. There are no sings of distress present; no indications of pain present. His family desires for comfort care only   Past Medical History:  Diagnosis Date  . Anxiety   . Benign prostatic hyperplasia   . Bilateral foot pain   . CHF (congestive heart failure) ([place])   . Depression   . Diverticulitis   . DJD (degenerative joint disease), cervical   . DNR (do not resuscitate)   . Esophageal dysmotility    age related per BPE 04/2015  . Esophageal stricture   . Falling   . GERD (gastroesophageal reflux disease)   . Gout   . Hiatal hernia   . History of recurrent TIAs   . HTN (hypertension)   . Hyperlipidemia   . Inguinal hernia   . Ischemic heart disease   . Kidney stone   . Osteoarthritis    bilat knees  . Pleural effusion   . Reflux   . Restless leg syndrome   . Right hip pain   . Skin cancer, basal cell   . Urinary retention     Past Surgical History:  Procedure Laterality Date  . ESOPHAGEAL DILATION    . EXPLORATORY LAPAROTOMY W/ BOWEL RESECTION    . FRACTURE SURGERY    . HEMIARTHROPLASTY HIP     Dr. Aline Brochure  . intestines      Social History   Socioeconomic History  . Marital status: Married    Spouse name: Not on file  . Number of children: Not on file  . Years of education: na  . Highest education level: Not on file  Occupational History  . Occupation: retired  Scientific laboratory technician  . Financial resource strain: Not hard at all  . Food insecurity:    Worry: Never true    Inability: Never true  . Transportation needs:    Medical: No   Non-medical: No  Tobacco Use  . Smoking status: Never Smoker  . Smokeless tobacco: Never Used  Substance and Sexual Activity  . Alcohol use: No    Alcohol/week: 0.0 standard drinks  . Drug use: No  . Sexual activity: Not on file  Lifestyle  . Physical activity:    Days per week: 0 days    Minutes per session: 0 min  . Stress: Not at all  Relationships  . Social connections:    Talks on phone: Never    Gets together: Never    Attends religious service: Never    Active member of club or organization: No    Attends meetings of clubs or organizations: Never    Relationship status: Married  . Intimate partner violence:    Fear of current or ex partner: No    Emotionally abused: No    Physically abused: No    Forced sexual activity: No  Other Topics Concern  . Not on file  Social History Narrative  . Not on file   Family History  Problem Relation Age of Onset  . Arthritis  Other       VITAL SIGNS BP 121/78   Pulse (!) 160   Temp (!) 97.4 F (36.3 C)   Resp (!) 22   Ht 6' (1.829 m)   Wt 135 lb 3.2 oz (61.3 kg)   BMI 18.34 kg/m   Outpatient Encounter Medications as of 04/20/2018  Medication Sig  . acetaminophen (TYLENOL) 325 MG tablet Take 650 mg by mouth every 4 (four) hours as needed.  Marland Kitchen allopurinol (ZYLOPRIM) 100 MG tablet Take 100 mg by mouth daily.  Marland Kitchen aspirin 81 MG chewable tablet Chew 81 mg by mouth daily.  . Cholecalciferol (VITAMIN D) 2000 UNITS CAPS Take 1 capsule by mouth daily.  Marland Kitchen docusate sodium (COLACE) 100 MG capsule Take 1 capsule (100 mg total) by mouth 2 (two) times daily. For constipation.  Marland Kitchen doxycycline (VIBRA-TABS) 100 MG tablet Take 100 mg by mouth 2 (two) times daily.  Marland Kitchen guaiFENesin (MUCINEX) 600 MG 12 hr tablet Take 600 mg 2 (two) times daily as needed by mouth for cough or to loosen phlegm.   Marland Kitchen ipratropium-albuterol (DUONEB) 0.5-2.5 (3) MG/3ML SOLN Take 3 mLs every 6 (six) hours as needed by nebulization (for shortness of breath).  . magnesium  hydroxide (MILK OF MAGNESIA) 400 MG/5ML suspension Take 30 mLs by mouth daily as needed for mild constipation. If no relief try a Fleets enema, after 2 days.  . Menthol, Topical Analgesic, (BIOFREEZE) 4 % GEL Apply small to back as needed for pain  . mupirocin ointment (BACTROBAN) 2 % Apply topically to areas located on face three times daily as needed  . neomycin-bacitracin-polymyxin (NEOSPORIN) ointment Apply ointment and telfa topically to skin tear on  right elbow. Wrap with Kling.  Change daily and as needed until resolved  . neomycin-bacitracin-polymyxin (NEOSPORIN) ointment Apply ointment and tegaderm topically to skin tear to right neck.  Change daily and as needed until resolved  . NON FORMULARY Diet Type:  Dysphagia 2 diet / NAS / thin liquids and 1.5 lt Fluid restriction  . Nutritional Supplements (ENSURE ENLIVE PO) Take by mouth. Give by mouth two times daily.. Prefers Chocolate  . omeprazole (PRILOSEC) 20 MG capsule Take 40 mg by mouth 2 (two) times daily.   Marland Kitchen oseltamivir (TAMIFLU) 75 MG capsule Take 75 mg by mouth at bedtime.  . polyethylene glycol (MIRALAX / GLYCOLAX) packet Take 17 g by mouth daily.  Marland Kitchen Propylene Glycol 0.6 % SOLN Apply 1 drop to both eyes twice a day  . rOPINIRole (REQUIP) 2 MG tablet Take 2 mg by mouth at bedtime.  . sertraline (ZOLOFT) 25 MG tablet Take 37.5 mg by mouth at bedtime.  . Vitamins A & D (VITAMIN A & D) ointment Apply topically to bilateral feet and toes every shift as needed for dryness and prevention of callous  . [DISCONTINUED] traMADol (ULTRAM) 50 MG tablet Take 50 mg by mouth daily as needed.   No facility-administered encounter medications on file as of 04/20/2018.      SIGNIFICANT DIAGNOSTIC EXAMS  LABS REVIEWED PREVIOUS :   01-23-18: wbc 6.9; hgb 10.3; hct 34.3 ;mcv 84.7 plt 312; glucose 89; bun 15; creat 1.10 ;k+ 4.0; na++ 133; ca 8.5  NO NEW LABS    Review of Systems  Unable to perform ROS: Patient unresponsive    Physical  Exam Constitutional:      General: He is not in acute distress.    Appearance: He is well-developed. He is not diaphoretic.     Comments: Cachexia  Neck:     Musculoskeletal: Neck supple.     Thyroid: No thyromegaly.  Cardiovascular:     Rate and Rhythm: Normal rate and regular rhythm.     Heart sounds: Normal heart sounds.  Pulmonary:     Effort: Pulmonary effort is normal. No respiratory distress.     Breath sounds: Normal breath sounds.  Abdominal:     General: Bowel sounds are normal. There is no distension.     Palpations: Abdomen is soft.     Tenderness: There is no abdominal tenderness.  Genitourinary:    Comments: Foley  Musculoskeletal:     Right lower leg: No edema.     Left lower leg: No edema.  Lymphadenopathy:     Cervical: No cervical adenopathy.  Skin:    General: Skin is warm and dry.  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 Failure to thrive in adult.   Will stop all medications Will begin tylenol supp every 6 hours as needed for T >99.0 Will begin dulcolax supp every 3 days as needed Will begin roxanol 5 mg every 2 hours as needed for pain or distress The focus of his care is for comfort only.     MD is aware of resident's narcotic use and is in agreement with current plan of care. We will attempt to wean resident as apropriate   Ok Edwards NP [facility]  Contact [phone] Monday through Friday 8am- 5pm  After hours call [phone]

## 2018-04-23 ENCOUNTER — Encounter: Payer: Self-pay | Admitting: Internal Medicine

## 2018-04-23 ENCOUNTER — Other Ambulatory Visit: Payer: Self-pay | Admitting: Adult Health

## 2018-04-23 ENCOUNTER — Non-Acute Institutional Stay (SKILLED_NURSING_FACILITY): Payer: Medicare Other | Admitting: Internal Medicine

## 2018-04-23 DIAGNOSIS — I739 Peripheral vascular disease, unspecified: Secondary | ICD-10-CM

## 2018-04-23 DIAGNOSIS — I4891 Unspecified atrial fibrillation: Secondary | ICD-10-CM | POA: Diagnosis not present

## 2018-04-23 DIAGNOSIS — Z515 Encounter for palliative care: Secondary | ICD-10-CM

## 2018-04-23 MED ORDER — LORAZEPAM 0.5 MG PO TABS: 0.5000 mg | ORAL_TABLET | ORAL | 0 refills | Status: AC | PRN

## 2018-04-23 NOTE — Progress Notes (Signed)
A user error has taken place.

## 2018-04-23 NOTE — Progress Notes (Signed)
Location:  [place] Room Number: [place] of Service:  SNF [phone]) Provider:  Veleta Miners, MD  [name], MD  Patient Care Team: [name], MD as PCP - General (Internal Medicine) Danie Binder, MD as Consulting Physician (Gastroenterology) [name], MD as Consulting Physician (Genetics) Wille Celeste, PA-C as Physician Assistant (Internal Medicine)  Extended Emergency Contact Information Primary Emergency Contact: Black,Shirley Address: [address]          [place], [place] [address] [name] of [place] Phone: [phone] Mobile Phone: [phone] Relation: Daughter Secondary Emergency Contact: [facility] Phone: [phone] Relation: Niece  Code Status:  DNR Goals of care: Advanced Directive information Advanced Directives 04/23/2018  Does Patient Have a Medical Advance Directive? Yes  Type of Advance Directive Out of facility DNR (pink MOST or yellow form)  Does patient want to make changes to medical advance directive? No - Patient declined  Copy of [place] in Chart? -  Pre-existing out of facility DNR order (yellow form or pink MOST form) Yellow form placed in chart (order not valid for inpatient use)     Chief Complaint  Patient presents with  . Acute Visit    Family concerns    HPI:  Pt is a 85 y.o. male seen today for an acute visit for End of life Care.  Patient Is Long term resident of facility and has h/o HTN, CHF, Depression, Arthritis, Anemia, GERD, Chronic Foley Cathter due to Urinary retention.PVD ABI in 2015 refused Aggressive approach, Aspiration Pneumonia, Dysphagia, Gout, paroxysmal Atrial Fibrillation not on any anticoagulation Patient had suddenly become lethargic over the weekend and his daughter did not want any aggressive work up. She did want to talk to me today to make sure he is comfortable and also to let me know that his body would be donated to [employer]  for research. Patient was unresponsive today. He would squeeze my hand. Also had apneic breathing. But he was comfortable per nurses.   Past Medical History:  Diagnosis Date  . Anxiety   . Benign prostatic hyperplasia   . Bilateral foot pain   . CHF (congestive heart failure) ([place])   . Depression   . Diverticulitis   . DJD (degenerative joint disease), cervical   . DNR (do not resuscitate)   . Esophageal dysmotility    age related per BPE 04/2015  . Esophageal stricture   . Falling   . GERD (gastroesophageal reflux disease)   . Gout   . Hiatal hernia   . History of recurrent TIAs   . HTN (hypertension)   . Hyperlipidemia   . Inguinal hernia   . Ischemic heart disease   . Kidney stone   . Osteoarthritis    bilat knees  . Pleural effusion   . Reflux   . Restless leg syndrome   . Right hip pain   . Skin cancer, basal cell   . Urinary retention    Past Surgical History:  Procedure Laterality Date  . ESOPHAGEAL DILATION    . EXPLORATORY LAPAROTOMY W/ BOWEL RESECTION    . FRACTURE SURGERY    . HEMIARTHROPLASTY HIP     Dr. Aline Brochure  . intestines      Allergies  Allergen Reactions  . Celebrex [Celecoxib] Other (See Comments)    Unknown; patient can't remember  . Codeine Other (See Comments)    nausea    Outpatient Encounter Medications as of 04/23/2018  Medication Sig  . acetaminophen (TYLENOL) 325 MG tablet Take 650 mg by mouth every 6 (six) hours as needed.   . bisacodyl (DULCOLAX) 10 MG suppository Place 10 mg rectally every 3 (three) days as needed for moderate constipation.  Marland Kitchen LORazepam (ATIVAN) 0.5 MG tablet Take 0.5 mg by mouth every 2 (two) hours as needed for anxiety.  Marland Kitchen morphine (ROXANOL) 20 MG/ML concentrated solution Take 0.25 mLs (5 mg total) by mouth every 2 (two) hours as needed for severe pain. Or distress for comfort measures  . neomycin-bacitracin-polymyxin (NEOSPORIN) ointment Apply ointment and telfa topically to skin tear on  right elbow. Wrap  with [place].  Change daily and as needed until resolved  . neomycin-bacitracin-polymyxin (NEOSPORIN) ointment Apply ointment and tegaderm topically to skin tear to right neck.  Change daily and as needed until resolved  . NON FORMULARY Diet Type:  Dysphagia 2 diet / NAS / thin liquids and 1.5 lt Fluid restriction  . Nutritional Supplements (ENSURE ENLIVE PO) Take by mouth. Give by mouth two times daily.. Prefers Chocolate  . Vitamins A & D (VITAMIN A & D) ointment Apply topically to bilateral feet and toes every shift as needed for dryness and prevention of callous  . [DISCONTINUED] allopurinol (ZYLOPRIM) 100 MG tablet Take 100 mg by mouth daily.  . [DISCONTINUED] aspirin 81 MG chewable tablet Chew 81 mg by mouth daily.  . [DISCONTINUED] Cholecalciferol (VITAMIN D) 2000 UNITS CAPS Take 1 capsule by mouth daily.  . [DISCONTINUED] docusate sodium (COLACE) 100 MG capsule Take 1 capsule (100 mg total) by mouth 2 (two) times daily. For constipation. (Patient not taking: Reported on 04/23/2018)  . [DISCONTINUED] guaiFENesin (MUCINEX) 600 MG 12 hr tablet Take 600 mg 2 (two) times daily as needed by mouth for cough or to loosen phlegm.   . [DISCONTINUED] ipratropium-albuterol (DUONEB) 0.5-2.5 (3) MG/3ML SOLN Take 3 mLs every 6 (six) hours as needed by nebulization (for shortness of breath).  . [DISCONTINUED] magnesium hydroxide (MILK OF MAGNESIA) 400 MG/5ML suspension Take 30 mLs by mouth daily as needed for mild constipation. If no relief try a Fleets enema, after 2 days.  . [DISCONTINUED] Menthol, Topical Analgesic, (BIOFREEZE) 4 % GEL Apply small to back as needed for pain  . [DISCONTINUED] mupirocin ointment (BACTROBAN) 2 % Apply topically to areas located on face three times daily as needed  . [DISCONTINUED] omeprazole (PRILOSEC) 20 MG capsule Take 40 mg by mouth 2 (two) times daily.   . [DISCONTINUED] oseltamivir (TAMIFLU) 75 MG capsule Take 75 mg by mouth at bedtime.  . [DISCONTINUED] polyethylene glycol  (MIRALAX / GLYCOLAX) packet Take 17 g by mouth daily.  . [DISCONTINUED] Propylene Glycol 0.6 % SOLN Apply 1 drop to both eyes twice a day  . [DISCONTINUED] rOPINIRole (REQUIP) 2 MG tablet Take 2 mg by mouth at bedtime.  . [DISCONTINUED] sertraline (ZOLOFT) 25 MG tablet Take 37.5 mg by mouth at bedtime.   No facility-administered encounter medications on file as of 04/23/2018.     Review of Systems  Unable to perform ROS: Patient unresponsive    Immunization History  Administered Date(s) Administered  . Influenza,inj,Quad PF,6+ Mos 12/07/2014  . Influenza-Unspecified 11/20/2013, 11/13/2015, 11/18/2016, 11/16/2017  . PPD Test 03/16/2013  . Pneumococcal-Unspecified 02/14/2009, 11/25/2015, 11/22/2017  . Tdap 11/10/2016   Pertinent  Health Maintenance Due  Topic Date Due  . PNA vac Low Risk Adult (2 of 2 - PCV13) 11/23/2018  . INFLUENZA VACCINE  Completed   Fall Risk  11/28/2017 11/10/2016  Falls in the past year? No No   Functional Status Survey:    Vitals:   04/23/18 1132  BP: (!) 94/59  Pulse: 96  Resp: 14  Temp: (!) 97.1 F (36.2 C)  Weight: 135 lb 3.2 oz (61.3 kg)  Height: 6' (1.829 m)   Body mass index is 18.34 kg/m. Physical Exam Vitals signs reviewed.  HENT:     Head: Normocephalic.     Nose: Nose normal.     Mouth/Throat:     Mouth: Mucous membranes are moist.  Eyes:     Pupils: Pupils are equal, round, and reactive to light.  Neck:     Musculoskeletal: Neck supple.  Cardiovascular:     Rate and Rhythm: Normal rate and regular rhythm.     Pulses: Normal pulses.  Pulmonary:     Effort: Pulmonary effort is normal. No respiratory distress.     Breath sounds: No wheezing.  Abdominal:     General: Abdomen is flat. Bowel sounds are normal.     Palpations: Abdomen is soft.  Musculoskeletal:        General: No swelling.  Skin:    General: Skin is warm.  Neurological:     Mental Status: He is lethargic.     Comments: He is unresponsive     Labs  reviewed: Recent Labs    10/05/17 0722 11/21/17 0430 01/23/18 0700  NA 135 134* 133*  K 4.0 4.5 4.0  CL 102 101 100  CO2 26 25 24   GLUCOSE 100* 88 89  BUN 15 11 15   CREATININE 1.06 1.12 1.10  CALCIUM 8.2* 8.2* 8.5*   Recent Labs    05/16/17 1255  AST 17  ALT 10*  ALKPHOS 102  BILITOT 0.4  PROT 7.1  ALBUMIN 3.2*   Recent Labs    10/05/17 0722 11/21/17 0430 01/23/18 0700  WBC 6.7 6.4 6.9  NEUTROABS 3.7 3.6 3.7  HGB 9.6* 9.6* 10.3*  HCT 31.4* 31.0* 34.3*  MCV 84.9 82.4 84.7  PLT 212 203 312   Lab Results  Component Value Date   TSH 1.323 04/13/2016   Lab Results  Component Value Date   HGBA1C 6.1 (H) 11/12/2014   No results found for: CHOL, HDL, LDLCALC, LDLDIRECT, TRIG, CHOLHDL  Significant Diagnostic Results in last 30 days:  No results found.  Assessment/Plan End of Life Care Patient Comfortable On Morphine and Ativan D/W Daughter. His Body will go to [facility] medical. No sign of active infection Most likely Stroke    Family/ staff Communication:   Labs/tests ordered:   Total time spent in this patient care encounter was 25_ minutes; greater than 50% of the visit spentreviewing records , Labs and coordinating care for problems addressed at this encounter.

## 2018-05-16 DEATH — deceased
